# Patient Record
Sex: Female | Born: 1945
Health system: Southern US, Community
[De-identification: ages and names within clinical notes are randomized; demographics above are authoritative.]

## PROBLEM LIST (undated history)

## (undated) ENCOUNTER — Emergency Department (HOSPITAL_COMMUNITY): Payer: Medicare Other | Source: Home / Self Care

## (undated) DIAGNOSIS — E871 Hypo-osmolality and hyponatremia: Secondary | ICD-10-CM

## (undated) DIAGNOSIS — Z720 Tobacco use: Secondary | ICD-10-CM

## (undated) DIAGNOSIS — G500 Trigeminal neuralgia: Secondary | ICD-10-CM

## (undated) DIAGNOSIS — M810 Age-related osteoporosis without current pathological fracture: Secondary | ICD-10-CM

## (undated) DIAGNOSIS — E876 Hypokalemia: Secondary | ICD-10-CM

## (undated) DIAGNOSIS — E119 Type 2 diabetes mellitus without complications: Secondary | ICD-10-CM

## (undated) DIAGNOSIS — F329 Major depressive disorder, single episode, unspecified: Secondary | ICD-10-CM

## (undated) DIAGNOSIS — E7800 Pure hypercholesterolemia, unspecified: Secondary | ICD-10-CM

## (undated) DIAGNOSIS — T7840XA Allergy, unspecified, initial encounter: Secondary | ICD-10-CM

## (undated) DIAGNOSIS — L8990 Pressure ulcer of unspecified site, unspecified stage: Secondary | ICD-10-CM

## (undated) DIAGNOSIS — J439 Emphysema, unspecified: Secondary | ICD-10-CM

## (undated) DIAGNOSIS — I214 Non-ST elevation (NSTEMI) myocardial infarction: Secondary | ICD-10-CM

## (undated) DIAGNOSIS — H269 Unspecified cataract: Secondary | ICD-10-CM

## (undated) DIAGNOSIS — R9431 Abnormal electrocardiogram [ECG] [EKG]: Secondary | ICD-10-CM

## (undated) DIAGNOSIS — J45909 Unspecified asthma, uncomplicated: Secondary | ICD-10-CM

## (undated) DIAGNOSIS — N189 Chronic kidney disease, unspecified: Secondary | ICD-10-CM

## (undated) DIAGNOSIS — F419 Anxiety disorder, unspecified: Secondary | ICD-10-CM

## (undated) DIAGNOSIS — J449 Chronic obstructive pulmonary disease, unspecified: Secondary | ICD-10-CM

## (undated) DIAGNOSIS — J9600 Acute respiratory failure, unspecified whether with hypoxia or hypercapnia: Secondary | ICD-10-CM

## (undated) DIAGNOSIS — E1122 Type 2 diabetes mellitus with diabetic chronic kidney disease: Secondary | ICD-10-CM

## (undated) DIAGNOSIS — D649 Anemia, unspecified: Secondary | ICD-10-CM

## (undated) DIAGNOSIS — N183 Chronic kidney disease, stage 3 (moderate): Secondary | ICD-10-CM

## (undated) DIAGNOSIS — R739 Hyperglycemia, unspecified: Secondary | ICD-10-CM

## (undated) DIAGNOSIS — K219 Gastro-esophageal reflux disease without esophagitis: Secondary | ICD-10-CM

## (undated) DIAGNOSIS — F32A Depression, unspecified: Secondary | ICD-10-CM

## (undated) DIAGNOSIS — I5032 Chronic diastolic (congestive) heart failure: Secondary | ICD-10-CM

## (undated) DIAGNOSIS — I10 Essential (primary) hypertension: Secondary | ICD-10-CM

## (undated) DIAGNOSIS — R0902 Hypoxemia: Secondary | ICD-10-CM

## (undated) DIAGNOSIS — M1990 Unspecified osteoarthritis, unspecified site: Secondary | ICD-10-CM

## (undated) HISTORY — PX: PLANTAR'S WART EXCISION: SHX2240

## (undated) HISTORY — DX: Pure hypercholesterolemia, unspecified: E78.00

## (undated) HISTORY — DX: Emphysema, unspecified: J43.9

## (undated) HISTORY — DX: Type 2 diabetes mellitus with diabetic chronic kidney disease: E11.22

## (undated) HISTORY — DX: Major depressive disorder, single episode, unspecified: F32.9

## (undated) HISTORY — DX: Unspecified cataract: H26.9

## (undated) HISTORY — DX: Chronic kidney disease, stage 3 (moderate): N18.3

## (undated) HISTORY — DX: Age-related osteoporosis without current pathological fracture: M81.0

## (undated) HISTORY — DX: Hypokalemia: E87.6

## (undated) HISTORY — DX: Depression, unspecified: F32.A

## (undated) HISTORY — DX: Anemia, unspecified: D64.9

## (undated) HISTORY — DX: Acute respiratory failure, unspecified whether with hypoxia or hypercapnia: J96.00

## (undated) HISTORY — DX: Type 2 diabetes mellitus without complications: E11.9

## (undated) HISTORY — DX: Hypoxemia: R09.02

## (undated) HISTORY — DX: Pressure ulcer of unspecified site, unspecified stage: L89.90

## (undated) HISTORY — DX: Tobacco use: Z72.0

## (undated) HISTORY — PX: ABDOMINAL HYSTERECTOMY: SHX81

## (undated) HISTORY — DX: Chronic kidney disease, unspecified: N18.9

## (undated) HISTORY — DX: Allergy, unspecified, initial encounter: T78.40XA

## (undated) HISTORY — PX: CHOLECYSTECTOMY: SHX55

## (undated) HISTORY — DX: Trigeminal neuralgia: G50.0

---

## 1997-05-08 ENCOUNTER — Ambulatory Visit (HOSPITAL_COMMUNITY): Admission: RE | Admit: 1997-05-08 | Discharge: 1997-05-08 | Payer: Self-pay | Admitting: Psychiatry

## 1997-05-20 ENCOUNTER — Ambulatory Visit (HOSPITAL_COMMUNITY): Admission: RE | Admit: 1997-05-20 | Discharge: 1997-05-20 | Payer: Self-pay | Admitting: Otolaryngology

## 2007-09-04 ENCOUNTER — Ambulatory Visit (HOSPITAL_COMMUNITY): Admission: RE | Admit: 2007-09-04 | Discharge: 2007-09-04 | Payer: Self-pay | Admitting: Internal Medicine

## 2011-10-03 DIAGNOSIS — R0602 Shortness of breath: Secondary | ICD-10-CM

## 2011-10-04 ENCOUNTER — Encounter (HOSPITAL_COMMUNITY): Payer: Self-pay | Admitting: Cardiology

## 2011-10-04 ENCOUNTER — Inpatient Hospital Stay (HOSPITAL_COMMUNITY)
Admission: AD | Admit: 2011-10-04 | Discharge: 2011-10-06 | DRG: 280 | Disposition: A | Discharge status: Home / Self Care | Payer: Medicare Other | Source: Other Acute Inpatient Hospital | Attending: Cardiology | Admitting: Cardiology

## 2011-10-04 DIAGNOSIS — I10 Essential (primary) hypertension: Secondary | ICD-10-CM | POA: Diagnosis present

## 2011-10-04 DIAGNOSIS — I214 Non-ST elevation (NSTEMI) myocardial infarction: Principal | ICD-10-CM | POA: Diagnosis present

## 2011-10-04 DIAGNOSIS — J9620 Acute and chronic respiratory failure, unspecified whether with hypoxia or hypercapnia: Secondary | ICD-10-CM

## 2011-10-04 DIAGNOSIS — K219 Gastro-esophageal reflux disease without esophagitis: Secondary | ICD-10-CM | POA: Diagnosis present

## 2011-10-04 DIAGNOSIS — R748 Abnormal levels of other serum enzymes: Secondary | ICD-10-CM

## 2011-10-04 DIAGNOSIS — I2510 Atherosclerotic heart disease of native coronary artery without angina pectoris: Secondary | ICD-10-CM | POA: Diagnosis present

## 2011-10-04 DIAGNOSIS — Z7982 Long term (current) use of aspirin: Secondary | ICD-10-CM

## 2011-10-04 DIAGNOSIS — E876 Hypokalemia: Secondary | ICD-10-CM | POA: Diagnosis present

## 2011-10-04 DIAGNOSIS — J45901 Unspecified asthma with (acute) exacerbation: Secondary | ICD-10-CM | POA: Diagnosis present

## 2011-10-04 DIAGNOSIS — Z880 Allergy status to penicillin: Secondary | ICD-10-CM

## 2011-10-04 DIAGNOSIS — R7309 Other abnormal glucose: Secondary | ICD-10-CM | POA: Diagnosis present

## 2011-10-04 DIAGNOSIS — Z882 Allergy status to sulfonamides status: Secondary | ICD-10-CM

## 2011-10-04 DIAGNOSIS — F17200 Nicotine dependence, unspecified, uncomplicated: Secondary | ICD-10-CM | POA: Diagnosis present

## 2011-10-04 DIAGNOSIS — R0602 Shortness of breath: Secondary | ICD-10-CM

## 2011-10-04 DIAGNOSIS — Z9089 Acquired absence of other organs: Secondary | ICD-10-CM

## 2011-10-04 DIAGNOSIS — E871 Hypo-osmolality and hyponatremia: Secondary | ICD-10-CM | POA: Diagnosis present

## 2011-10-04 DIAGNOSIS — Z90710 Acquired absence of both cervix and uterus: Secondary | ICD-10-CM

## 2011-10-04 DIAGNOSIS — J441 Chronic obstructive pulmonary disease with (acute) exacerbation: Secondary | ICD-10-CM | POA: Diagnosis present

## 2011-10-04 HISTORY — DX: Gastro-esophageal reflux disease without esophagitis: K21.9

## 2011-10-04 HISTORY — DX: Unspecified asthma, uncomplicated: J45.909

## 2011-10-04 HISTORY — DX: Essential (primary) hypertension: I10

## 2011-10-04 HISTORY — DX: Anxiety disorder, unspecified: F41.9

## 2011-10-04 HISTORY — DX: Non-ST elevation (NSTEMI) myocardial infarction: I21.4

## 2011-10-04 HISTORY — DX: Abnormal electrocardiogram (ECG) (EKG): R94.31

## 2011-10-04 HISTORY — DX: Hyperglycemia, unspecified: R73.9

## 2011-10-04 HISTORY — DX: Hypo-osmolality and hyponatremia: E87.1

## 2011-10-04 HISTORY — DX: Chronic obstructive pulmonary disease, unspecified: J44.9

## 2011-10-04 HISTORY — DX: Unspecified osteoarthritis, unspecified site: M19.90

## 2011-10-04 LAB — BASIC METABOLIC PANEL
BUN: 10 mg/dL (ref 6–23)
GFR calc Af Amer: >90 mL/min (ref >90)
GFR calc non Af Amer: >90 mL/min (ref >90)
Potassium: 4.5 mEq/L (ref 3.5–5.1)
Sodium: 130 mEq/L — ABNORMAL LOW (ref 135–145)

## 2011-10-04 LAB — MRSA PCR SCREENING: MRSA by PCR: NEGATIVE

## 2011-10-04 MED ORDER — HEPARIN (PORCINE) IN NACL 100-0.45 UNIT/ML-% IJ SOLN
950.0000 [IU]/h | INTRAMUSCULAR | Status: DC
  Administered 2011-10-04: 650 [IU]/h via INTRAVENOUS
  Administered 2011-10-05: 950 [IU]/h via INTRAVENOUS
  Filled 2011-10-04 (×2): qty 250

## 2011-10-04 MED ORDER — ALBUTEROL SULFATE (5 MG/ML) 0.5% IN NEBU
2.5000 mg | INHALATION_SOLUTION | Freq: Four times a day (QID) | RESPIRATORY_TRACT | Status: DC
  Administered 2011-10-04: 2.5 mg via RESPIRATORY_TRACT
  Filled 2011-10-04: qty 0.5

## 2011-10-04 MED ORDER — SODIUM CHLORIDE 0.9 % IV SOLN: 250.0000 mL | INTRAVENOUS | Status: DC | PRN

## 2011-10-04 MED ORDER — PANTOPRAZOLE SODIUM 40 MG PO TBEC
40.0000 mg | DELAYED_RELEASE_TABLET | Freq: Two times a day (BID) | ORAL | Status: DC
  Administered 2011-10-04 – 2011-10-06 (×4): 40 mg via ORAL
  Filled 2011-10-04 (×4): qty 1

## 2011-10-04 MED ORDER — SODIUM CHLORIDE 0.9 % IJ SOLN: 3.0000 mL | Freq: Two times a day (BID) | INTRAMUSCULAR | Status: DC

## 2011-10-04 MED ORDER — CEFTRIAXONE SODIUM 1 G IJ SOLR
1.0000 g | INTRAMUSCULAR | Status: DC
  Administered 2011-10-05 – 2011-10-06 (×2): 1 g via INTRAVENOUS
  Filled 2011-10-04 (×3): qty 10

## 2011-10-04 MED ORDER — SODIUM CHLORIDE 0.9 % IJ SOLN: 3.0000 mL | INTRAMUSCULAR | Status: DC | PRN

## 2011-10-04 MED ORDER — DEXTROSE 5 % IV SOLN: 1.0000 g | INTRAVENOUS | Status: DC

## 2011-10-04 MED ORDER — METHYLPREDNISOLONE SODIUM SUCC 125 MG IJ SOLR
80.0000 mg | Freq: Three times a day (TID) | INTRAMUSCULAR | Status: DC
  Administered 2011-10-04: 19:00:00 via INTRAVENOUS
  Administered 2011-10-05 – 2011-10-06 (×5): 80 mg via INTRAVENOUS
  Filled 2011-10-04 (×4): qty 1.28
  Filled 2011-10-04: qty 2
  Filled 2011-10-04: qty 1.28
  Filled 2011-10-04: qty 2
  Filled 2011-10-04: qty 1.28

## 2011-10-04 MED ORDER — NITROGLYCERIN 0.4 MG SL SUBL: 0.4000 mg | SUBLINGUAL_TABLET | SUBLINGUAL | Status: DC | PRN

## 2011-10-04 MED ORDER — IPRATROPIUM BROMIDE 0.02 % IN SOLN
0.5000 mg | Freq: Four times a day (QID) | RESPIRATORY_TRACT | Status: DC
  Administered 2011-10-04: 0.5 mg via RESPIRATORY_TRACT
  Filled 2011-10-04: qty 2.5

## 2011-10-04 MED ORDER — ACETAMINOPHEN 325 MG PO TABS
650.0000 mg | ORAL_TABLET | ORAL | Status: DC | PRN
  Administered 2011-10-05: 650 mg via ORAL
  Filled 2011-10-04: qty 2

## 2011-10-04 MED ORDER — ASPIRIN EC 81 MG PO TBEC
81.0000 mg | DELAYED_RELEASE_TABLET | Freq: Every day | ORAL | Status: DC
  Administered 2011-10-06: 81 mg via ORAL
  Filled 2011-10-04 (×2): qty 1

## 2011-10-04 MED ORDER — ONDANSETRON HCL 4 MG/2ML IJ SOLN: 4.0000 mg | Freq: Four times a day (QID) | INTRAMUSCULAR | Status: DC | PRN

## 2011-10-04 NOTE — H&P (Signed)
NAME:  Sheila Pierce, Sheila Pierce ROOM: 250  UNIT NUMBER:  [id number] LOCATION: [facility] 07 ADM/VISIT DATE:  10/04/2011   ADM Sheila Pierce:  [id number] DOB: [birth year]   PRIMARY CARDIOLOGIST:  None.  PRIMARY MEDICAL DOCTOR:  Kirstie Peri, M.D.  REASON FOR CONSULTATION:  Elevated troponin of 1.32.  CHIEF COMPLAINT:  Shortness of breath.  HISTORY OF PRESENT ILLNESS:  Sheila Pierce is a 66 year old female with a history of COPD, hypertension but no formerly diagnosed heart disease with a negative stress echo 02/2011.  She initially presented overnight Saturday/early Sunday morning with complaints of shortness of breath and wheezing.  Her white count was normal at that time.  Chest x-ray showed severe COPD but was not acute otherwise.  She was treated with Solu-Medrol and albuterol and was discharged to home in improved condition.  However, she returned later that evening, which was last night, with similar complaints.  She became even more short of breath to the point where she was gasping for air.  Her son attempted to turn her home oxygen up to 5 liters but she was still in distress, thus EMS was called.  She was placed on BiPAP.  Chest x-ray revealed severe COPD without active cardiopulmonary disease.  Sodium was low at 129 with an elevation in white count to 17,200 keeping in mind that she had recommended Solu-Medrol within the last 24 hours.  Cardiac enzymes were obtained demonstrating an elevated troponin of up to 1.32, trending down to 1.11.  Potassium was 2.7.   She was given Lovenox full dose, nitroglycerin paste, 40 mEq of potassium and started on ceftriaxone and Solu-Medrol as well as albuterol nebulizers.  She endorses chest discomfort like a soreness sensation when she was acutely gasping for air but otherwise denies this.  She is not having chest pain now.  The shortness of breath has improved this morning and she is more comfortable off of BiPAP.  She has chronic dyspnea on exertion and cough but  worse lately, preceded by what she describes as a head cold given that ragweed typically exacerbates her allergy symptoms.  She is currently comfortable.  EKG demonstrates normal sinus rhythm with wide T wave inversions in 2, 3 and aVF as well as V4 though V6 with QTC prolongation of 590 milliseconds, probable anterior infarct, age undetermined.  QTC on 10/03/2011 was 462.  PAST MEDICAL HISTORY: 1. COPD on home oxygen. 2. Hypertension. 3. Chest pain, 02/2011 but with a negative dobutamine stress echo at that time with normal ejection fraction.  I note that she had atherosclerotic calcifications of the abdomen aorta and branch vessels on a CT abdomen that admission. 4. Asthma. 5. Seasonal allergies.  PAST SURGICAL HISTORY: 1. Cholecystectomy. 2. Hysterectomy.  MEDICATIONS: 1. Albuterol/Atrovent nebulized q. 6 hours. 2. Solu-Medrol 80 mg q. 8 hours. 3. Ceftriaxone 1 gram q. 24 hours. 4. Lovenox per pharmacy. 5. Aspirin 162 mg 10/03/2011, although the patient does not recall taking this.  HOME MEDICATIONS: 1. Quinapril 40 mg daily. 2. Advair 250 mcg b.i.d. 3. Albuterol inhaled q. 4 hours. 4. Omeprazole 20 mg b.i.d. 5. Zithromax and prednisone which were prescribed from her ER discharge early Sunday morning.  ALLERGIES:  CODEINE, HYDROCODONE, PENICILLIN, SULFA.  SOCIAL HISTORY:   The patient has been smoking for 40 years and states that nobody can make up her mind but herself but currently is not trying to quit.  She denies any alcohol or drug use.  FAMILY HISTORY:  Positive for cancer with liver  cancer in her mother and lung cancer in her father.  Negative for coronary artery disease.  REVIEW OF SYSTEMS:  No fever or chills, nausea or vomiting.  Please above.  No orthopnea, PND.  No recent travel or bedrest.  All other systems were reviewed and are otherwise negative.  LABORATORY AND RADIOLOGICAL DATA:  WBC 13,600, hemoglobin 12.5, hematocrit 36.9, platelet count 232, 000.  INR 0.9.    Cardiac enzymes on the first set showed CK of 130, MB 15.4 and troponin 1.32, relative index 11.8.   The next set showed CK 98, MB 15.1, troponin 1.11 with a relative index of 15.4.  BNP 538.  Sodium 130, potassium 3, chloride 91, CO2 is 30 and glucose 148, BUN 10, creatinine 0.65.  Chest x-ray showed stable severe COPD with bibasilar scarring and no active disease.  EKG 10/04/2011 showed normal sinus rhythm at 83 beats per minute with wide T wave inversions in 2, 3, aVF and V4 through V6 with probable anterior infarct age undetermined, QTC of 32.  On 10/03/2011, similar T wave inversion was present but with a QTC of 462.  These changes are new since 12/15/2010.  PHYSICAL EXAMINATION:  Vital signs:  Pulse 103, respirations 18, blood pressure 112/78, pulse oximetry 98% on oxygen.  General:  This is a chronically ill-appearing white female, thin in no acute distress.  HEENT:  Normocephalic and atraumatic.  Extraocular movements intact.   Clear sclerae.  Nares are without discharge.  Neck:  Supple without carotid bruits.  Heart:  On auscultation, reveals slightly tachycardiac rate, regular rhythm with S1 and S2 without murmurs, rubs or gallops.  Lungs:  Sounds have decreased breath sounds throughout, faint wheezes but otherwise clear bilaterally.  Abdomen:  Soft and nontender, nondistended with positive bowel sounds.  Extremities:  Warm and dry without edema.  She has 2+ pedal pulses bilaterally.  Neurological:  She is alert and oriented x3 and responds to questions appropriately with a normal affect.  ASSESSMENT AND PLAN: 1. Acute on chronic respiratory failure. 2. Acute on chronic obstructive pulmonary disease exacerbation. 3. Non-STEMI with no prior history of known coronary artery disease. 4. Abnormal electrocardiogram/QTC prolongation in the setting of hypokalemia. 5. History of hypertension, stable.  The patient was seen and examined by Dr. Antoine Poche and myself.  Sheila Pierce is a 66 year old lady  with a history of COPD and hypertension, longstanding tobacco abuse who presents with acute and chronic respiratory failure in the setting of acute COPD exacerbation but cannot rule out underlying coronary artery disease, given increased troponin up to 1.32.  Given her cardiac risk factors, we would recommend cardiac catheterization but may need to hold off until improved from a respiratory standpoint.  We will give 324 mg of aspirin x1 now given uncertainty of the time frame of which she received her initial aspirin and then start 81 mg daily tomorrow.  We would recommend transfer to [facility] for cardiac monitoring with plans for eventual catheterization.  We would continue Lovenox for now, but may transition to heparin per pharmacy given need for procedure.  No beta blockers secondary to COPD.  Check fasting lipids in the morning and add statin if needed.  For EKG changes/QTC prolongation, we need to address her electrolytes and for now we will give 3 runs of 10 mEq of potassium chloride over 1 hour each followed by 40 mEq dose of p.o. potassium and then reassess status later this afternoon.  Magnesium level is 1.8 so we will give  1 gram of magnesium sulfate as well.  Risks, benefits and alternatives to catheterization were discussed with the patient and she is agreeable to transfer and proceeding with heart catheterization.  We hope to arrange this transfer later on today.  She will need to be reassessed in the morning before formerly scheduling catheterization.   __________________________    Shirlean Mylar M.D.  Alinda Money D: 10/04/2011 1253 T: 10/04/2011 1327 P: [id number]  cc:  Kirstie Peri, M.D.

## 2011-10-04 NOTE — Progress Notes (Addendum)
Met pt here in [place] upon transfer. VSS. Breathing is stable on [place]. Will check BMET to reassess K. QTc appears much narrower on telemetry now (received 3 runs of and PO KCl prior to arrival due to K of 3.0 as well as 1g Mg Sulfate). Hold off on restarting home ACEI due to soft BP's. Continue nebs, Ceftriaxone (addendum: pt has hx of PCN allergy but has tolerated Ceftriaxone thus far as she began receiving it 10/03/11- will continue to monitor closely). Pt should be reassessed in AM to determine if she is ready for cath. Will ask for heparin per pharmacy in meantime starting tonight (was on Lovenox at [facility] but due to impending cath would prefer shorter acting agent). She has no complaints. All questions answered. Dayna Dunn PA-C

## 2011-10-04 NOTE — Progress Notes (Signed)
ANTICOAGULATION CONSULT NOTE - Initial Consult  Pharmacy Consult for heparin Indication: chest pain/ACS  Allergies  Allergen Reactions  . Codeine   . Hydrocodone   . Penicillins   . Sulfa Antibiotics     Patient Measurements: Height: 5' (152.4 cm) Weight: 103 lb 9.9 oz (47 kg) IBW/kg (Calculated) : 50  Heparin Dosing Weight: 47 kg  Vital Signs: Temp: 98.3 F (36.8 C) (09/09 1703) Temp src: Oral (09/09 1703) BP: 127/69 mmHg (09/09 1703) Pulse Rate: 93  (09/09 1703)  Labs: No results found for this basename: HGB:2,HCT:3,PLT:3,APTT:3,LABPROT:3,INR:3,HEPARINUNFRC:3,CREATININE:3,CKTOTAL:3,CKMB:3,TROPONINI:3 in the last 72 hours  CrCl is unknown because no creatinine reading has been taken.   Medical History: No past medical history on file.  Medications:  Prescriptions prior to admission  Medication Sig Dispense Refill  . quinapril (ACCUPRIL) 40 MG tablet Take 40 mg by mouth daily.        Assessment: 66 yo lady transferred from [facility] for further evaluation of chest pain.  Per report, she received lovenox this am but I do not see where this was charted.  She is to start heparin here as cath planned for am. Goal of Therapy:  Heparin level 0.3-0.7 units/ml Monitor platelets by anticoagulation protocol: Yes   Plan:  Start heparin drip with no bolus at 650 units/hr. Check heparin level 8 hours after start. Daily HL and CBC while on heparin.   Monitor for s&s bleeding.  Thanks for allowing pharmacy to be a part of this patient's care.  Talbert Cage, PharmD Clinical Pharmacist, [phone]

## 2011-10-05 ENCOUNTER — Encounter (HOSPITAL_COMMUNITY)
Admission: AD | Disposition: A | Discharge status: Home / Self Care | Payer: Self-pay | Source: Other Acute Inpatient Hospital | Attending: Cardiology

## 2011-10-05 DIAGNOSIS — I2510 Atherosclerotic heart disease of native coronary artery without angina pectoris: Secondary | ICD-10-CM

## 2011-10-05 DIAGNOSIS — I214 Non-ST elevation (NSTEMI) myocardial infarction: Secondary | ICD-10-CM | POA: Diagnosis present

## 2011-10-05 HISTORY — PX: LEFT HEART CATHETERIZATION WITH CORONARY ANGIOGRAM: SHX5451

## 2011-10-05 LAB — BASIC METABOLIC PANEL
CO2: 27 mEq/L (ref 19–32)
Chloride: 97 mEq/L (ref 96–112)
Glucose, Bld: 122 mg/dL — ABNORMAL HIGH (ref 70–99)
Potassium: 4.6 mEq/L (ref 3.5–5.1)
Sodium: 131 mEq/L — ABNORMAL LOW (ref 135–145)

## 2011-10-05 LAB — EXPECTORATED SPUTUM ASSESSMENT W GRAM STAIN, RFLX TO RESP C

## 2011-10-05 LAB — POCT ACTIVATED CLOTTING TIME: Activated Clotting Time: 104 seconds

## 2011-10-05 LAB — HEMOGLOBIN A1C: Hgb A1c MFr Bld: 5.6 % (ref <5.7)

## 2011-10-05 LAB — CBC
HCT: 36.8 % (ref 36.0–46.0)
MCH: 31.1 pg (ref 26.0–34.0)
MCHC: 34.5 g/dL (ref 30.0–36.0)
MCV: 90.2 fL (ref 78.0–100.0)
Platelets: 282 10*3/uL (ref 150–400)
RDW: 14 % (ref 11.5–15.5)
WBC: 15.6 10*3/uL — ABNORMAL HIGH (ref 4.0–10.5)

## 2011-10-05 LAB — COMPREHENSIVE METABOLIC PANEL WITH GFR
ALT: 17 U/L (ref 0–35)
AST: 20 U/L (ref 0–37)
Albumin: 2.9 g/dL — ABNORMAL LOW (ref 3.5–5.2)
Alkaline Phosphatase: 51 U/L (ref 39–117)
BUN: 12 mg/dL (ref 6–23)
CO2: 29 meq/L (ref 19–32)
Calcium: 9.3 mg/dL (ref 8.4–10.5)
Chloride: 96 meq/L (ref 96–112)
Creatinine, Ser: 0.53 mg/dL (ref 0.50–1.10)
GFR calc Af Amer: >90 mL/min
GFR calc non Af Amer: >90 mL/min
Glucose, Bld: 123 mg/dL — ABNORMAL HIGH (ref 70–99)
Potassium: 5.1 meq/L (ref 3.5–5.1)
Sodium: 131 meq/L — ABNORMAL LOW (ref 135–145)
Total Bilirubin: 0.2 mg/dL — ABNORMAL LOW (ref 0.3–1.2)
Total Protein: 5.6 g/dL — ABNORMAL LOW (ref 6.0–8.3)

## 2011-10-05 LAB — LIPID PANEL: LDL Cholesterol: 89 mg/dL (ref 0–99)

## 2011-10-05 LAB — MAGNESIUM: Magnesium: 1.9 mg/dL (ref 1.5–2.5)

## 2011-10-05 SURGERY — LEFT HEART CATHETERIZATION WITH CORONARY ANGIOGRAM
Anesthesia: LOCAL

## 2011-10-05 MED ORDER — SODIUM CHLORIDE 0.9 % IV SOLN: 250.0000 mL | INTRAVENOUS | Status: DC | PRN

## 2011-10-05 MED ORDER — IPRATROPIUM BROMIDE 0.02 % IN SOLN
0.5000 mg | Freq: Two times a day (BID) | RESPIRATORY_TRACT | Status: DC
  Administered 2011-10-05 – 2011-10-06 (×3): 0.5 mg via RESPIRATORY_TRACT
  Filled 2011-10-05 (×3): qty 2.5

## 2011-10-05 MED ORDER — SODIUM CHLORIDE 0.9 % IJ SOLN: 3.0000 mL | INTRAMUSCULAR | Status: DC | PRN

## 2011-10-05 MED ORDER — ONDANSETRON HCL 4 MG/2ML IJ SOLN: 4.0000 mg | Freq: Four times a day (QID) | INTRAMUSCULAR | Status: DC | PRN

## 2011-10-05 MED ORDER — ALBUTEROL SULFATE (5 MG/ML) 0.5% IN NEBU: 2.5000 mg | INHALATION_SOLUTION | Freq: Four times a day (QID) | RESPIRATORY_TRACT | Status: DC | PRN

## 2011-10-05 MED ORDER — SODIUM CHLORIDE 0.9 % IV SOLN: 250.0000 mL | INTRAVENOUS | Status: DC

## 2011-10-05 MED ORDER — ALBUTEROL SULFATE (5 MG/ML) 0.5% IN NEBU
2.5000 mg | INHALATION_SOLUTION | Freq: Two times a day (BID) | RESPIRATORY_TRACT | Status: DC
  Administered 2011-10-05 – 2011-10-06 (×3): 2.5 mg via RESPIRATORY_TRACT
  Filled 2011-10-05 (×3): qty 0.5

## 2011-10-05 MED ORDER — IPRATROPIUM BROMIDE 0.02 % IN SOLN: 0.5000 mg | Freq: Two times a day (BID) | RESPIRATORY_TRACT | Status: DC

## 2011-10-05 MED ORDER — NITROGLYCERIN 0.2 MG/ML ON CALL CATH LAB
INTRAVENOUS | Status: AC
  Filled 2011-10-05: qty 1

## 2011-10-05 MED ORDER — SODIUM CHLORIDE 0.9 % IV SOLN: 1.0000 mL/kg/h | INTRAVENOUS | Status: AC

## 2011-10-05 MED ORDER — MIDAZOLAM HCL 2 MG/2ML IJ SOLN
INTRAMUSCULAR | Status: AC
  Filled 2011-10-05: qty 2

## 2011-10-05 MED ORDER — LIDOCAINE HCL (PF) 1 % IJ SOLN
INTRAMUSCULAR | Status: AC
  Filled 2011-10-05: qty 30

## 2011-10-05 MED ORDER — HEPARIN (PORCINE) IN NACL 2-0.9 UNIT/ML-% IJ SOLN
INTRAMUSCULAR | Status: AC
  Filled 2011-10-05: qty 2000

## 2011-10-05 MED ORDER — SODIUM CHLORIDE 0.9 % IJ SOLN
3.0000 mL | Freq: Two times a day (BID) | INTRAMUSCULAR | Status: DC
  Administered 2011-10-06: 3 mL via INTRAVENOUS

## 2011-10-05 MED ORDER — SODIUM CHLORIDE 0.9 % IJ SOLN: 3.0000 mL | Freq: Two times a day (BID) | INTRAMUSCULAR | Status: DC

## 2011-10-05 MED ORDER — SODIUM CHLORIDE 0.9 % IV SOLN
INTRAVENOUS | Status: DC
  Administered 2011-10-05: 50 mL/h via INTRAVENOUS

## 2011-10-05 MED ORDER — ACETAMINOPHEN 325 MG PO TABS: 650.0000 mg | ORAL_TABLET | ORAL | Status: DC | PRN

## 2011-10-05 MED ORDER — ASPIRIN 81 MG PO CHEW
324.0000 mg | CHEWABLE_TABLET | ORAL | Status: DC
  Filled 2011-10-05: qty 4

## 2011-10-05 MED ORDER — FENTANYL CITRATE 0.05 MG/ML IJ SOLN
INTRAMUSCULAR | Status: AC
  Filled 2011-10-05: qty 2

## 2011-10-05 MED ORDER — HEPARIN BOLUS VIA INFUSION
1500.0000 [IU] | Freq: Once | INTRAVENOUS | Status: AC
  Administered 2011-10-05: 1500 [IU] via INTRAVENOUS
  Filled 2011-10-05: qty 1500

## 2011-10-05 MED ORDER — ASPIRIN 81 MG PO CHEW
324.0000 mg | CHEWABLE_TABLET | ORAL | Status: AC
  Administered 2011-10-05: 324 mg via ORAL

## 2011-10-05 NOTE — CV Procedure (Signed)
   Cardiac Catheterization Procedure Note  Name: Sheila Pierce MRN: [id number] DOB: [birth year]  Procedure: Left Heart Cath, Selective Coronary Angiography, LV angiography  Indication: Elevated troponin in the setting of COPD exacerbation  Procedural details: The right groin was prepped, draped, and anesthetized with 1% lidocaine. Using modified Seldinger technique, a 5 French sheath was introduced into the right femoral artery. Standard Judkins catheters were used for coronary angiography and left ventriculography. Catheter exchanges were performed over a guidewire. There were no immediate procedural complications. The patient was transferred to the post catheterization recovery area for further monitoring.  Procedural Findings: Hemodynamics:  AO 116/60 LV 115/8   Coronary angiography: Coronary dominance: right  Left mainstem: Widely patent without significant stenosis.  Left anterior descending (LAD): The LAD is patent throughout. She has minimal luminal irregularity. There are 2 diagonals, both are small. The first diagonal has 40% ostial stenosis.  Left circumflex (LCx): The left circumflex is widely patent. There were 2 obtuse marginal branches without significant stenosis. The proximal left circumflex has mild irregularity noted.  Right coronary artery (RCA): This is a dominant vessel. The RCA is of moderate caliber. The PDA and PLA branches are patent. There is no significant stenosis present.  Left ventriculography: There is mild hypokinesis of the antero-apex. The left ventricular ejection fraction is estimated at 50%.  Final Conclusions:   1. Nonobstructive coronary artery disease 2. Mild segmental left ventricular systolic dysfunction.  Tonny Bollman 10/05/2011, 5:45 PM

## 2011-10-05 NOTE — Interval H&P Note (Signed)
History and Physical Interval Note:  10/05/2011 5:13 PM  Sheila Pierce  has presented today for surgery, with the diagnosis of cp  The various methods of treatment have been discussed with the patient and family. After consideration of risks, benefits and other options for treatment, the patient has consented to  Procedure(s) (LRB) with comments: LEFT HEART CATHETERIZATION WITH CORONARY ANGIOGRAM (N/A) as a surgical intervention .  The patient's history has been reviewed, patient examined, no change in status, stable for surgery.  I have reviewed the patient's chart and labs.  Questions were answered to the patient's satisfaction.     Tonny Bollman

## 2011-10-05 NOTE — Care Management Note (Signed)
    Page 1 of 1   10/05/2011     8:43:49 AM   CARE MANAGEMENT NOTE 10/05/2011  Patient:  Sheila Pierce, Sheila Pierce   Account Number:  [id number]  Date Initiated:  10/05/2011  Documentation initiated by:  Junius Creamer  Subjective/Objective Assessment:   adm  w mi, copd     Action/Plan:   lives w fam   Anticipated DC Date:     Anticipated DC Plan:        DC Planning Services  CM consult      Choice offered to / List presented to:             Status of service:   Medicare Important Message given?   (If response is "NO", the following Medicare IM given date fields will be blank) Date Medicare IM given:   Date Additional Medicare IM given:    Discharge Disposition:    Per UR Regulation:  Reviewed for med. necessity/level of care/duration of stay  If discussed at Long Length of Stay Meetings, dates discussed:    Comments:  9/10 8:42a debbie Kynleigh Artz rn,bsn [phone]

## 2011-10-05 NOTE — H&P (View-Only) (Signed)
SUBJECTIVE:  Breathing is close to baseline   PHYSICAL EXAM Filed Vitals:   10/04/11 2200 10/04/11 2300 10/05/11 0000 10/05/11 0400  BP: 112/57 108/51 119/68 127/68  Pulse: 94 96 92   Temp:   97.8 F (36.6 C) 98.4 F (36.9 C)  TempSrc:   Oral Oral  Resp:   16 20  Height:      Weight:      SpO2: 99% 97% 97%    General:  No acute distress, frail Lungs:  Decreased breath sounds with wheezing Heart:  RRR Abdomen:  Positive bowel sounds, no rebound no guarding Extremities:  No edema  LABS: No results found for this basename: CKTOTAL, CKMB, CKMBINDEX, TROPONINI   Results for orders placed during the hospital encounter of 10/04/11 (from the past 24 hour(s))  MRSA PCR SCREENING     Status: Normal   Collection Time   10/04/11  5:01 PM      Component Value Range   MRSA by PCR NEGATIVE  NEGATIVE  BASIC METABOLIC PANEL     Status: Abnormal   Collection Time   10/04/11  5:35 PM      Component Value Range   Sodium 130 (*) 135 - 145 mEq/L   Potassium 4.5  3.5 - 5.1 mEq/L   Chloride 94 (*) 96 - 112 mEq/L   CO2 29  19 - 32 mEq/L   Glucose, Bld 107 (*) 70 - 99 mg/dL   BUN 10  6 - 23 mg/dL   Creatinine, Ser 0.86  0.50 - 1.10 mg/dL   Calcium 9.4  8.4 - 57.8 mg/dL   GFR calc non Af Amer >90  >90 mL/min   GFR calc Af Amer >90  >90 mL/min  CULTURE, EXPECTORATED SPUTUM-ASSESSMENT     Status: Normal   Collection Time   10/04/11  7:23 PM      Component Value Range   Specimen Description SPUTUM     Special Requests NONE     Sputum evaluation       Value: MICROSCOPIC FINDINGS SUGGEST THAT THIS SPECIMEN IS NOT REPRESENTATIVE OF LOWER RESPIRATORY SECRETIONS. PLEASE RECOLLECT.     CALLED TO RN H.HAYES BY L.PITT AT 0039 10/05/11   Report Status 10/05/2011 FINAL    COMPREHENSIVE METABOLIC PANEL     Status: Abnormal   Collection Time   10/05/11  1:43 AM      Component Value Range   Sodium 131 (*) 135 - 145 mEq/L   Potassium 5.1  3.5 - 5.1 mEq/L   Chloride 96  96 - 112 mEq/L   CO2 29  19 -  32 mEq/L   Glucose, Bld 123 (*) 70 - 99 mg/dL   BUN 12  6 - 23 mg/dL   Creatinine, Ser 4.69  0.50 - 1.10 mg/dL   Calcium 9.3  8.4 - 62.9 mg/dL   Total Protein 5.6 (*) 6.0 - 8.3 g/dL   Albumin 2.9 (*) 3.5 - 5.2 g/dL   AST 20  0 - 37 U/L   ALT 17  0 - 35 U/L   Alkaline Phosphatase 51  39 - 117 U/L   Total Bilirubin 0.2 (*) 0.3 - 1.2 mg/dL   GFR calc non Af Amer >90  >90 mL/min   GFR calc Af Amer >90  >90 mL/min  MAGNESIUM     Status: Normal   Collection Time   10/05/11  1:43 AM      Component Value Range   Magnesium 1.9  1.5 -  2.5 mg/dL  HEPARIN LEVEL (UNFRACTIONATED)     Status: Abnormal   Collection Time   10/05/11  1:43 AM      Component Value Range   Heparin Unfractionated <0.10 (*) 0.30 - 0.70 IU/mL  CBC     Status: Abnormal   Collection Time   10/05/11  1:43 AM      Component Value Range   WBC 15.6 (*) 4.0 - 10.5 K/uL   RBC 4.08  3.87 - 5.11 MIL/uL   Hemoglobin 12.7  12.0 - 15.0 g/dL   HCT 16.1  09.6 - 04.5 %   MCV 90.2  78.0 - 100.0 fL   MCH 31.1  26.0 - 34.0 pg   MCHC 34.5  30.0 - 36.0 g/dL   RDW 40.9  81.1 - 91.4 %   Platelets 282  150 - 400 K/uL  LIPID PANEL     Status: Normal   Collection Time   10/05/11  1:43 AM      Component Value Range   Cholesterol 158  0 - 200 mg/dL   Triglycerides 75  <782 mg/dL   HDL 54  >95 mg/dL   Total CHOL/HDL Ratio 2.9     VLDL 15  0 - 40 mg/dL   LDL Cholesterol 89  0 - 99 mg/dL    Intake/Output Summary (Last 24 hours) at 10/05/11 6213 Last data filed at 10/05/11 0600  Gross per 24 hour  Intake  432.7 ml  Output    600 ml  Net -167.3 ml    ASSESSMENT AND PLAN:  NQWMI - Cath today.  Hypokalemia - Supplemented.  Repeat 5.1.  EKG pending.  COPD -  Continue current therapy.  Tobacco abuse - She has been educated and is not ready to quit   Rollene Rotunda 10/05/2011 6:26 AM

## 2011-10-05 NOTE — Progress Notes (Signed)
ANTICOAGULATION CONSULT NOTE   Pharmacy Consult for heparin Indication: chest pain/ACS  Allergies  Allergen Reactions  . Penicillins Swelling  . Codeine   . Hydrocodone   . Sulfa Antibiotics     Patient Measurements: Height: 5' (152.4 cm) Weight: 103 lb 9.9 oz (47 kg) IBW/kg (Calculated) : 50  Heparin Dosing Weight: 47 kg  Vital Signs: Temp: 97.8 F (36.6 C) (09/10 0000) Temp src: Oral (09/10 0000) BP: 119/68 mmHg (09/10 0000) Pulse Rate: 92  (09/10 0000)  Labs:  Basename 10/05/11 0143 10/04/11 1735  HGB 12.7 --  HCT 36.8 --  PLT 282 --  APTT -- --  LABPROT -- --  INR -- --  HEPARINUNFRC <0.10* --  CREATININE -- 0.59  CKTOTAL -- --  CKMB -- --  TROPONINI -- --    Estimated Creatinine Clearance: 49.7 ml/min (by C-G formula based on Cr of 0.59).  Assessment: 66 yo female with chest pain for Heparin  Goal of Therapy:  Heparin level 0.3-0.7 units/ml Monitor platelets by anticoagulation protocol: Yes   Plan:  Heparin 1500 units IV bolus, then increase heparin 950 units/hr Follow-up after cath.  Geannie Risen, PharmD, BCPS

## 2011-10-05 NOTE — Progress Notes (Signed)
 SUBJECTIVE:  Breathing is close to baseline   PHYSICAL EXAM Filed Vitals:   10/04/11 2200 10/04/11 2300 10/05/11 0000 10/05/11 0400  BP: 112/57 108/51 119/68 127/68  Pulse: 94 96 92   Temp:   97.8 F (36.6 C) 98.4 F (36.9 C)  TempSrc:   Oral Oral  Resp:   16 20  Height:      Weight:      SpO2: 99% 97% 97%    General:  No acute distress, frail Lungs:  Decreased breath sounds with wheezing Heart:  RRR Abdomen:  Positive bowel sounds, no rebound no guarding Extremities:  No edema  LABS: No results found for this basename: CKTOTAL, CKMB, CKMBINDEX, TROPONINI   Results for orders placed during the hospital encounter of 10/04/11 (from the past 24 hour(s))  MRSA PCR SCREENING     Status: Normal   Collection Time   10/04/11  5:01 PM      Component Value Range   MRSA by PCR NEGATIVE  NEGATIVE  BASIC METABOLIC PANEL     Status: Abnormal   Collection Time   10/04/11  5:35 PM      Component Value Range   Sodium 130 (*) 135 - 145 mEq/L   Potassium 4.5  3.5 - 5.1 mEq/L   Chloride 94 (*) 96 - 112 mEq/L   CO2 29  19 - 32 mEq/L   Glucose, Bld 107 (*) 70 - 99 mg/dL   BUN 10  6 - 23 mg/dL   Creatinine, Ser 0.59  0.50 - 1.10 mg/dL   Calcium 9.4  8.4 - 10.5 mg/dL   GFR calc non Af Amer >90  >90 mL/min   GFR calc Af Amer >90  >90 mL/min  CULTURE, EXPECTORATED SPUTUM-ASSESSMENT     Status: Normal   Collection Time   10/04/11  7:23 PM      Component Value Range   Specimen Description SPUTUM     Special Requests NONE     Sputum evaluation       Value: MICROSCOPIC FINDINGS SUGGEST THAT THIS SPECIMEN IS NOT REPRESENTATIVE OF LOWER RESPIRATORY SECRETIONS. PLEASE RECOLLECT.     CALLED TO RN H.HAYES BY L.PITT AT 0039 10/05/11   Report Status 10/05/2011 FINAL    COMPREHENSIVE METABOLIC PANEL     Status: Abnormal   Collection Time   10/05/11  1:43 AM      Component Value Range   Sodium 131 (*) 135 - 145 mEq/L   Potassium 5.1  3.5 - 5.1 mEq/L   Chloride 96  96 - 112 mEq/L   CO2 29  19 -  32 mEq/L   Glucose, Bld 123 (*) 70 - 99 mg/dL   BUN 12  6 - 23 mg/dL   Creatinine, Ser 0.53  0.50 - 1.10 mg/dL   Calcium 9.3  8.4 - 10.5 mg/dL   Total Protein 5.6 (*) 6.0 - 8.3 g/dL   Albumin 2.9 (*) 3.5 - 5.2 g/dL   AST 20  0 - 37 U/L   ALT 17  0 - 35 U/L   Alkaline Phosphatase 51  39 - 117 U/L   Total Bilirubin 0.2 (*) 0.3 - 1.2 mg/dL   GFR calc non Af Amer >90  >90 mL/min   GFR calc Af Amer >90  >90 mL/min  MAGNESIUM     Status: Normal   Collection Time   10/05/11  1:43 AM      Component Value Range   Magnesium 1.9  1.5 -   2.5 mg/dL  HEPARIN LEVEL (UNFRACTIONATED)     Status: Abnormal   Collection Time   10/05/11  1:43 AM      Component Value Range   Heparin Unfractionated <0.10 (*) 0.30 - 0.70 IU/mL  CBC     Status: Abnormal   Collection Time   10/05/11  1:43 AM      Component Value Range   WBC 15.6 (*) 4.0 - 10.5 K/uL   RBC 4.08  3.87 - 5.11 MIL/uL   Hemoglobin 12.7  12.0 - 15.0 g/dL   HCT 36.8  36.0 - 46.0 %   MCV 90.2  78.0 - 100.0 fL   MCH 31.1  26.0 - 34.0 pg   MCHC 34.5  30.0 - 36.0 g/dL   RDW 14.0  11.5 - 15.5 %   Platelets 282  150 - 400 K/uL  LIPID PANEL     Status: Normal   Collection Time   10/05/11  1:43 AM      Component Value Range   Cholesterol 158  0 - 200 mg/dL   Triglycerides 75  <150 mg/dL   HDL 54  >39 mg/dL   Total CHOL/HDL Ratio 2.9     VLDL 15  0 - 40 mg/dL   LDL Cholesterol 89  0 - 99 mg/dL    Intake/Output Summary (Last 24 hours) at 10/05/11 0626 Last data filed at 10/05/11 0600  Gross per 24 hour  Intake  432.7 ml  Output    600 ml  Net -167.3 ml    ASSESSMENT AND PLAN:  NQWMI - Cath today.  Hypokalemia - Supplemented.  Repeat 5.1.  EKG pending.  COPD -  Continue current therapy.  Tobacco abuse - She has been educated and is not ready to quit   Rane Blitch 10/05/2011 6:26 AM   

## 2011-10-06 ENCOUNTER — Encounter (HOSPITAL_COMMUNITY): Payer: Self-pay | Admitting: Physician Assistant

## 2011-10-06 DIAGNOSIS — I219 Acute myocardial infarction, unspecified: Secondary | ICD-10-CM

## 2011-10-06 LAB — BASIC METABOLIC PANEL
Calcium: 9.7 mg/dL (ref 8.4–10.5)
Creatinine, Ser: 0.56 mg/dL (ref 0.50–1.10)
GFR calc Af Amer: >90 mL/min (ref >90)
GFR calc non Af Amer: >90 mL/min (ref >90)

## 2011-10-06 MED ORDER — PREDNISONE 10 MG PO TABS: ORAL_TABLET | ORAL | Status: DC

## 2011-10-06 MED ORDER — ASPIRIN 81 MG PO TBEC: 81.0000 mg | DELAYED_RELEASE_TABLET | Freq: Every day | ORAL | Status: AC

## 2011-10-06 MED ORDER — NITROGLYCERIN 0.4 MG SL SUBL: 0.4000 mg | SUBLINGUAL_TABLET | SUBLINGUAL | Status: DC | PRN

## 2011-10-06 MED ORDER — CEFUROXIME AXETIL 250 MG PO TABS: 250.0000 mg | ORAL_TABLET | Freq: Two times a day (BID) | ORAL | Status: AC

## 2011-10-06 MED ORDER — INFLUENZA VIRUS VACC SPLIT PF IM SUSP
0.5000 mL | Freq: Once | INTRAMUSCULAR | Status: AC
  Administered 2011-10-06: 0.5 mL via INTRAMUSCULAR
  Filled 2011-10-06: qty 0.5

## 2011-10-06 NOTE — Progress Notes (Signed)
Discharge instructions given and reviewed with patient and family member. Patient discharged home.  

## 2011-10-06 NOTE — Discharge Summary (Signed)
Discharge Summary   Patient ID: Sheila Pierce MRN: [id number], DOB/AGE: 03/04/1945 66 y.o. Admit date: 10/04/2011 D/C date:     10/06/2011  Primary Cardiologist: Mystic Labo  Primary Discharge Diagnoses:  1. NSTEMI likely secondary to demand ischemia - cardiac cath 10/05/11 with nonobstructive disease 2. Acute COPD exacerbation 3. Acute on chronic respiratory failure (COPD on home O2) 4. Abnormal EKG/QTc prolongation 5. HTN 6. Hypokalemia, repleted 7. Hyponatremia 8. Hyperglycemia, related to steroid use  Secondary Discharge Diagnoses:  1. Seasonal allergies 2. Hx of cholecystectomy 3. Hx of hysterectomy 4. GERD  Hospital Course: Sheila Pierce is a 66 year old female with a history of COPD, hypertension but no formerly diagnosed heart disease with a negative stress echo 02/2011. She initially presented overnight 9/8 with complaints of shortness of breath and wheezing. Her white count was normal at that time. Chest x-ray showed severe COPD but was not acute otherwise. She was treated with Solu-Medrol and albuterol and was discharged to home in improved condition. However, she returned later that evening, which was last night, with similar complaints. She became even more short of breath to the point where she was gasping for air. Her son attempted to turn her home oxygen up to 5 liters but she was still in distress, thus EMS was called. She was placed on BiPAP. Chest x-ray revealed severe COPD without active cardiopulmonary disease. She does report feeling some sinus congestion the days prior to this event. Sodium was low at 129 with an elevation in white count to 17,200 keeping in mind that she had recommended Solu-Medrol within that last 24 hours. Cardiac enzymes were obtained demonstrating an elevated troponin of up to 1.32, trending down to 1.11. Potassium was 2.7. She was given Lovenox full dose, nitroglycerin paste, and started on ceftriaxone and Solu-Medrol as well as albuterol nebulizers. At  time of cardiology evaluation at [facility] on 9/9, EKG demonstrated wide T wave inversions in 2, 3 and aVF as well as V4 though V6  QTc prolongation of 590 in the setting of K of 3.0. These findings were new from prior tracing. Mg was 1.8. She was given 1gm of Mg Sulfate and potassium was repleted both IV and PO. She endorsed chest discomfort like a soreness sensation when she was acutely gasping for air but otherwise denied this. She has chronic dyspnea. She was transferred to [facility] with plans for cardiac cath given her long tobacco hx and positive enzymes. This demonstrated: Left mainstem: Widely patent without significant stenosis.  Left anterior descending (LAD): The LAD is patent throughout. She has minimal luminal irregularity. There are 2 diagonals, both are small. The first diagonal has 40% ostial stenosis.  Left circumflex (LCx): The left circumflex is widely patent. There were 2 obtuse marginal branches without significant stenosis. The proximal left circumflex has mild irregularity noted.  Right coronary artery (RCA): This is a dominant vessel. The RCA is of moderate caliber. The PDA and PLA branches are patent. There is no significant stenosis present.  Left ventriculography: There is mild hypokinesis of the antero-apex. The left ventricular ejection fraction is estimated at 50%.  Final Conclusions:  1. Nonobstructive coronary artery disease  2. Mild segmental left ventricular systolic dysfunction Med rx was recommended. Today the patient is doing well. QTc improved by time of discharge. BMET today reveals K of 4.7, Na of 132 (lowest at 129 on admission) possibly related to underlying lung process. Dr. Antoine Poche recommended fluid restriction at home. He also recommended steroid taper & completion of  7-day course of antibiotics. Given her PCN allergy, I discussed the use of PO cephalosporins with pharmacy. The patient tolerated Ceftriaxone without adverse event while in the hospital  since 9/8. Per their recommendation, will send her home on Ceftin 250mg  BID. Note no BB due to her lung disease. She also had some elevated CBG while in the hospital likely related to steroid usage as HgbA1C was normal. Dr. Antoine Poche has seen and examined the patient and feels she is stable for discharge. We are holding quinipril at discharge given that her pressurehas been stable, at times on the lower end, while here in the hospital off of this medicine.   The pt was offered arrangement of home health services at discharge but wanted to wait to talk to her PCP about this tomorrow as she was eager to go home due to time constraints with her son's work.  Discharge Vitals: Blood pressure 137/59, pulse 91, temperature 97.5 F (36.4 C), temperature source Oral, resp. rate 18, height 5' (1.524 m), weight 103 lb 9.9 oz (47 kg), SpO2 95.00%.  Labs: Lab Results  Component Value Date   WBC 15.6* 10/05/2011   HGB 12.7 10/05/2011   HCT 36.8 10/05/2011   MCV 90.2 10/05/2011   PLT 282 10/05/2011     Lab 10/06/11 1157 10/05/11 0143  NA 132* --  K 4.7 --  CL 97 --  CO2 29 --  BUN 14 --  CREATININE 0.56 --  CALCIUM 9.7 --  PROT -- 5.6*  BILITOT -- 0.2*  ALKPHOS -- 51  ALT -- 17  AST -- 20  GLUCOSE 101* --    Lab Results  Component Value Date   CHOL 158 10/05/2011   HDL 54 10/05/2011   LDLCALC 89 10/05/2011   TRIG 75 10/05/2011    Diagnostic Studies/Procedures   See H&P and cath as above  Discharge Medications   Current Discharge Medication List    START taking these medications   Details  aspirin EC 81 MG EC tablet Take 1 tablet (81 mg total) by mouth daily.    cefUROXime (CEFTIN) 250 MG tablet Take 1 tablet (250 mg total) by mouth 2 (two) times daily. Qty: 6 tablet, Refills: 0   Comments: If you develop any swelling, itching, shortness of breath or any other worrisome symptoms while taking this, seek medical attention. Some people with penicillin allergies cannot take this medicine,  but you tolerated a medication in the same class (ceftriaxone) while in hospital without any reaction.    nitroGLYCERIN (NITROSTAT) 0.4 MG SL tablet Place 1 tablet (0.4 mg total) under the tongue every 5 (five) minutes as needed for chest pain (up to 3 doses). Qty: 25 tablet, Refills: 4      CONTINUE these medications which have CHANGED   Details  predniSONE (DELTASONE) 10 MG tablet Orally - Take 6 tablets daily for two days, then 5 tablets daily for two days, then 4 tablets daily for two days, then 3 tablets daily for two days, then 2 tablets daily for two days, then 1 tablet daily for two days then stop. Qty: 42 tablet, Refills: 0      CONTINUE these medications which have NOT CHANGED   Details  albuterol (PROVENTIL HFA;VENTOLIN HFA) 108 (90 BASE) MCG/ACT inhaler Inhale 2 puffs into the lungs every 4 (four) hours as needed. For shortness of breath.    Fluticasone-Salmeterol (ADVAIR) 250-50 MCG/DOSE AEPB Inhale 1 puff into the lungs every 12 (twelve) hours.    omeprazole (PRILOSEC) 20  MG capsule Take 20 mg by mouth 2 (two) times daily.      STOP taking these medications     azithromycin (ZITHROMAX) 250 MG tablet Comments:  Reason for Stopping:       quinapril (ACCUPRIL) 40 MG tablet Comments:  Reason for Stopping:           Disposition   The patient will be discharged in stable condition to home. Discharge Orders    Future Appointments: Provider: Department: Dept Phone: Center:   10/26/2011 4:15 PM Rollene Rotunda, MD [facility] [phone] [facility]     Future Orders Please Complete By Expires   Diet - low sodium heart healthy      Comments:   Restrict your fluid to no more than 1.5-2 liters of fluid per day (includes all sources including water, milk, coffee, tea, soups, etc).   Increase activity slowly      Comments:   Keep procedure site clean & dry. If you notice increased pain, swelling, bleeding or pus, call/return!  You may shower, but no soaking  baths/hot tubs/pools for 1 week. No driving for 1 week. No lifting over 10 lbs for 2 weeks. No sexual activity for 2 weeks.     Follow-up Information    Schedule an appointment as soon as possible for a visit with [facility], MD. (Very important! Please call primary doctor when you are discharged to schedule an appointment to be seen within 5 days.)    Contact information:   [address]  [place] [address] [phone]       Follow up with [facility]. (Take lab slip to [facility] on 10/13/11 to have electrolytes checked. Register at the main entrance.)       Follow up with Rollene Rotunda, MD. (10/26/11 at 4:15pm)    Contact information:   [address] [place], [place] [phone] [facility]           Duration of Discharge Encounter: Greater than 30 minutes including physician and PA time.  Signed, Ronie Spies PA-C 10/06/2011, 2:37 PM  Patient seen and examined.  Plan as discussed in my rounding note for yesterday and outlined above. Rollene Rotunda  10/07/2011  8:11 AM

## 2011-10-06 NOTE — Progress Notes (Signed)
    SUBJECTIVE:  Breathing is at baseline.  Cough improved.  No chest pain.     PHYSICAL EXAM Filed Vitals:   10/05/11 2300 10/06/11 0017 10/06/11 0300 10/06/11 0454  BP: 106/71 119/73 95/48 115/63  Pulse: 108 98 86 85  Temp:  98.4 F (36.9 C)  98.4 F (36.9 C)  TempSrc:  Oral  Oral  Resp:    22  Height:      Weight:      SpO2: 94% 97% 97% 95%   General:  No acute distress, frail Lungs:  Decreased breath sounds with wheezing Heart:  RRR Abdomen:  Positive bowel sounds, no rebound no guarding Extremities:  No edema, right groin without bruising or hematoma.   LABS: No results found for this basename: CKTOTAL,  CKMB,  CKMBINDEX,  TROPONINI   Results for orders placed during the hospital encounter of 10/04/11 (from the past 24 hour(s))  BASIC METABOLIC PANEL     Status: Abnormal   Collection Time   10/05/11  7:00 AM      Component Value Range   Sodium 131 (*) 135 - 145 mEq/L   Potassium 4.6  3.5 - 5.1 mEq/L   Chloride 97  96 - 112 mEq/L   CO2 27  19 - 32 mEq/L   Glucose, Bld 122 (*) 70 - 99 mg/dL   BUN 11  6 - 23 mg/dL   Creatinine, Ser 1.61  0.50 - 1.10 mg/dL   Calcium 9.2  8.4 - 09.6 mg/dL   GFR calc non Af Amer >90  >90 mL/min   GFR calc Af Amer >90  >90 mL/min  POCT ACTIVATED CLOTTING TIME     Status: Normal   Collection Time   10/05/11  5:36 PM      Component Value Range   Activated Clotting Time 104      Intake/Output Summary (Last 24 hours) at 10/06/11 0653 Last data filed at 10/06/11 0200  Gross per 24 hour  Intake   1131 ml  Output   1450 ml  Net   -319 ml    ASSESSMENT AND PLAN:  NQWMI - Cath yesterday with nonobstructive disease.  No further work up.    Hypokalemia - Supplemented.  Repeat BMET before discharge.  At home fluid restrict for low Na.  She needs a repeat BMET in one week.  COPD -  Discharge to home to complete a total 7 day course of antibiotics and a steroid dose pack taper.  She needs follow up with her primary provider within 5  days.  Hold quinapril at discharge.    Tobacco abuse - She has been educated and is not ready to quit   Rollene Rotunda 10/06/2011 6:53 AM

## 2011-10-13 ENCOUNTER — Encounter: Payer: Self-pay | Admitting: Physician Assistant

## 2011-10-15 ENCOUNTER — Telehealth: Payer: Self-pay | Admitting: *Deleted

## 2011-10-15 NOTE — Telephone Encounter (Signed)
Thanks for update

## 2011-10-15 NOTE — Telephone Encounter (Signed)
Patient was discharged from [facility] on 09/11 on prednisone to use for 12 days. Patient filled prescription and started it on 10/07/11. Patient informed nurse that she stopped taking the prednisone on yesterday because it was causing her to have aggressive behaviors and she felt like she was about to smack somebody in her house. Patient is scheduled to see Hochrein on 10/26/11 for post hospital visit.

## 2011-10-26 ENCOUNTER — Ambulatory Visit: Payer: PRIVATE HEALTH INSURANCE | Admitting: Cardiology

## 2011-11-24 ENCOUNTER — Ambulatory Visit (INDEPENDENT_AMBULATORY_CARE_PROVIDER_SITE_OTHER): Payer: Self-pay | Admitting: Physician Assistant

## 2011-11-24 ENCOUNTER — Encounter: Payer: Self-pay | Admitting: Physician Assistant

## 2011-11-24 VITALS — BP 160/90 | HR 96 | Ht 60.0 in | Wt 101.0 lb

## 2011-11-24 DIAGNOSIS — I2510 Atherosclerotic heart disease of native coronary artery without angina pectoris: Secondary | ICD-10-CM

## 2011-11-24 DIAGNOSIS — E785 Hyperlipidemia, unspecified: Secondary | ICD-10-CM | POA: Insufficient documentation

## 2011-11-24 DIAGNOSIS — I10 Essential (primary) hypertension: Secondary | ICD-10-CM

## 2011-11-24 DIAGNOSIS — R9431 Abnormal electrocardiogram [ECG] [EKG]: Secondary | ICD-10-CM

## 2011-11-24 DIAGNOSIS — E876 Hypokalemia: Secondary | ICD-10-CM

## 2011-11-24 DIAGNOSIS — J449 Chronic obstructive pulmonary disease, unspecified: Secondary | ICD-10-CM

## 2011-11-24 MED ORDER — QUINAPRIL HCL 40 MG PO TABS: 40.0000 mg | ORAL_TABLET | Freq: Every day | ORAL | Status: DC

## 2011-11-24 MED ORDER — ATORVASTATIN CALCIUM 40 MG PO TABS: 40.0000 mg | ORAL_TABLET | Freq: Every evening | ORAL | Status: DC

## 2011-11-24 NOTE — Assessment & Plan Note (Signed)
Normalized by followup EKG today, with QTC 457 ms

## 2011-11-24 NOTE — Assessment & Plan Note (Signed)
Patient to resume quinapril at previous dose of 40 mg daily, for treatment of HTN. Follow BMET in one week.

## 2011-11-24 NOTE — Patient Instructions (Addendum)
   Begin Quinapril 40mg  daily  Begin Lipitor 40mg  every evening   Lab:  BMET - do in 1 week (around November 6)  Labs - due in 12 weeks for fasting lipid and liver panel - will mail reminder in mail   Office will contact with results  Continue all other current medications. Follow up in  3 months

## 2011-11-24 NOTE — Progress Notes (Addendum)
Primary Cardiologist: Rollene Rotunda, MD   HPI: Post hospital followup from [facility], status post NST EMI (peak troponin 1.3), with no prior history of heart disease. She initially presented to [facility] with acute/chronic respiratory failure (COPD on home O2).   - Cardiac catheterization: Nonobstructive CAD; EF 50%, mild anteroapical HK  Patient also presented with elevated QTc interval (0.590) , in setting of hypokalemia (3.0) and hyponatremia (low 129), with magnesium 1.8.  Clinically, patient continues to report no CP. She denies any complications of the right groin incision site. She does have significant dyspnea from her COPD, and uses supplemental O2 both at night and as needed.  Followup labs as follows: Potassium 3.7, BUN 13, creatinine 0.5   Allergies  Allergen Reactions  . Penicillins Swelling  . Codeine   . Hydrocodone   . Sulfa Antibiotics     Current Outpatient Prescriptions  Medication Sig Dispense Refill  . albuterol (PROVENTIL HFA;VENTOLIN HFA) 108 (90 BASE) MCG/ACT inhaler Inhale 2 puffs into the lungs every 4 (four) hours as needed. For shortness of breath.      Marland Kitchen aspirin EC 81 MG EC tablet Take 1 tablet (81 mg total) by mouth daily.      Marland Kitchen atorvastatin (LIPITOR) 40 MG tablet Take 1 tablet (40 mg total) by mouth every evening.  30 tablet  6  . Fluticasone-Salmeterol (ADVAIR) 250-50 MCG/DOSE AEPB Inhale 1 puff into the lungs every 12 (twelve) hours.      . nitroGLYCERIN (NITROSTAT) 0.4 MG SL tablet Place 1 tablet (0.4 mg total) under the tongue every 5 (five) minutes as needed for chest pain (up to 3 doses).  25 tablet  4  . omeprazole (PRILOSEC) 20 MG capsule Take 20 mg by mouth 2 (two) times daily as needed.       . quinapril (ACCUPRIL) 40 MG tablet Take 1 tablet (40 mg total) by mouth daily.  30 tablet  6    Past Medical History  Diagnosis Date  . Hypertension   . COPD (chronic obstructive pulmonary disease)     Chronic resp failure on home O2  . Asthma   . GERD  (gastroesophageal reflux disease)   . Arthritis   . Anxiety   . QT prolongation     Noted on EKG 09/2011 (590 in setting of K of 3, improved to 475 by discharge)  . Hyponatremia     Noted 09/2011 admission.  . Hyperglycemia     Noted 09/2011 admission in setting of steroid use with normal HgbA1C.  Marland Kitchen NSTEMI (non-ST elevated myocardial infarction)     09/2011 in setting of acute on chronic resp failure/COPD exacerbation - cath demonstrated nonobstructive CAD 10/05/11 with EF 50%.  . Hypokalemia     Past Surgical History  Procedure Date  . Abdominal hysterectomy   . Plantar's wart excision   . Cholecystectomy     History   Social History  . Marital Status: Unknown    Spouse Name: N/A    Number of Children: N/A  . Years of Education: N/A   Occupational History  . Not on file.   Social History Main Topics  . Smoking status: Current Every Day Smoker -- 0.5 packs/day    Types: Cigarettes  . Smokeless tobacco: Not on file  . Alcohol Use: No  . Drug Use: No  . Sexually Active:    Other Topics Concern  . Not on file   Social History Narrative  . No narrative on file    No family  history on file.  ROS: no nausea, vomiting; no fever, chills; no melena, hematochezia; no claudication  PHYSICAL EXAM: BP 160/90  Pulse 96  Ht 5' (1.524 m)  Wt 101 lb (45.813 kg)  BMI 19.73 kg/m2  SpO2 94% GENERAL: 66 year old female; NAD HEENT: NCAT, PERRLA, EOMI; sclera clear; no xanthelasma NECK: palpable bilateral carotid pulses, no bruits; no JVD; no TM LUNGS: Diminished breath sounds throughout CARDIAC: RRR (S1, S2); no significant murmurs; no rubs or gallops ABDOMEN: soft, non-tender; intact BS EXTREMETIES: no significant peripheral edema SKIN: warm/dry; no obvious rash/lesions MUSCULOSKELETAL: no joint deformity NEURO: no focal deficit; NL affect   EKG: reviewed and available in Electronic Records   ASSESSMENT & PLAN:  CAD (coronary artery disease) Aggressive CRF  modification recommended. Patient to continue on low-dose ASA, indefinitely. NTG tablets were provided. We will also resume ACE inhibitor therapy with quinapril, which patient had been on the past. Recent labs indicated normal renal function and potassium. We'll order followup BNET in one week. Patient to return in 3 months, and establish with Dr. Antoine Poche.  Hypertension Patient to resume quinapril at previous dose of 40 mg daily, for treatment of HTN. Follow BMET in one week.   Hypokalemia Resolved by post hospital followup labs  Dyslipidemia Recent LDL 89. Will initiate statin therapy with Lipitor 40 mg daily, check followup FLP/LFT profile in 12 weeks. Aggressive management recommended with target LDL 70 or less, if feasible.  QT prolongation Normalized by followup EKG today, with QTC 457 ms  COPD (chronic obstructive pulmonary disease) On supplemental O2, followed by primary M.D. Beta blocker therapy not recommended.    Gene Kendell Gammon, PAC

## 2011-11-24 NOTE — Assessment & Plan Note (Signed)
Result by post hospital followup labs

## 2011-11-24 NOTE — Assessment & Plan Note (Signed)
Recent LDL 89. Will initiate statin therapy with Lipitor 40 mg daily, check followup FLP/LFT profile in 12 weeks. Aggressive management recommended with target LDL 70 or less, if feasible.

## 2011-11-24 NOTE — Assessment & Plan Note (Signed)
On supplemental O2, followed by primary M.D. Beta blocker therapy not recommended.

## 2011-11-24 NOTE — Assessment & Plan Note (Signed)
Aggressive CRF modification recommended. Patient to continue on low-dose ASA, indefinitely. NTG tablets were provided. We will also resume ACE inhibitor therapy with quinapril, which patient had been on the past. Recent labs indicated normal renal function and potassium. We'll order followup BNET in one week. Patient to return in 3 months, and establish with Dr. Antoine Poche.

## 2011-12-03 ENCOUNTER — Telehealth: Payer: Self-pay | Admitting: *Deleted

## 2011-12-03 NOTE — Telephone Encounter (Signed)
Notes Recorded by Lesle Chris, LPN on 96/0/4540 at 8:51 AM Patient notified of below. Will forward info to PMD.

## 2011-12-03 NOTE — Telephone Encounter (Signed)
Message copied by Lesle Chris on Fri Dec 03, 2011  8:53 AM ------      Message from: Prescott Parma C      Created: Thu Dec 02, 2011 11:13 AM       K 3.2, NL renal fxn. Pt not on a diuretic. Recommend she increase diet intake of K rich foods (ie tomatoes, bananas), and to f/u with primary MD.

## 2012-01-25 ENCOUNTER — Encounter: Payer: Self-pay | Admitting: Cardiology

## 2012-02-15 ENCOUNTER — Encounter: Payer: Self-pay | Admitting: Cardiovascular Disease

## 2012-02-15 ENCOUNTER — Ambulatory Visit (INDEPENDENT_AMBULATORY_CARE_PROVIDER_SITE_OTHER): Payer: Medicare Other | Admitting: Cardiovascular Disease

## 2012-02-15 VITALS — BP 164/76 | HR 97 | Ht 60.0 in | Wt 101.0 lb

## 2012-02-15 DIAGNOSIS — Z79899 Other long term (current) drug therapy: Secondary | ICD-10-CM

## 2012-02-15 DIAGNOSIS — I2510 Atherosclerotic heart disease of native coronary artery without angina pectoris: Secondary | ICD-10-CM

## 2012-02-15 DIAGNOSIS — E785 Hyperlipidemia, unspecified: Secondary | ICD-10-CM

## 2012-02-15 DIAGNOSIS — J449 Chronic obstructive pulmonary disease, unspecified: Secondary | ICD-10-CM

## 2012-02-15 DIAGNOSIS — I10 Essential (primary) hypertension: Secondary | ICD-10-CM

## 2012-02-15 MED ORDER — AMLODIPINE BESYLATE 5 MG PO TABS: 5.0000 mg | ORAL_TABLET | Freq: Every day | ORAL | Status: DC

## 2012-02-15 NOTE — Progress Notes (Signed)
HPI  This is a 67 year old female who is here today for a followup visit. She presented in September of 2013 with non-ST elevation myocardial infarction in the setting of acute COPD exacerbation. She underwent heart catheterization which showed mild nonobstructive coronary artery disease with an ejection fraction of 50%. She did not require revascularization and was treated medically. She has known history of severe COPD with active tobacco use. She also has hypertension. Quinapril was resumed during last visit for blood pressure control. Followup basic metabolic profile showed normal renal function. She denies any chest pain currently. She continues to have significant dyspnea likely related to her known history of severe COPD.  Allergies  Allergen Reactions  . Penicillins Swelling  . Codeine   . Hydrocodone   . Sulfa Antibiotics      Current Outpatient Prescriptions on File Prior to Visit  Medication Sig Dispense Refill  . albuterol (PROVENTIL HFA;VENTOLIN HFA) 108 (90 BASE) MCG/ACT inhaler Inhale 2 puffs into the lungs every 4 (four) hours as needed. For shortness of breath.      Marland Kitchen aspirin EC 81 MG EC tablet Take 1 tablet (81 mg total) by mouth daily.      Marland Kitchen atorvastatin (LIPITOR) 40 MG tablet Take 1 tablet (40 mg total) by mouth every evening.  30 tablet  6  . Fluticasone-Salmeterol (ADVAIR) 250-50 MCG/DOSE AEPB Inhale 1 puff into the lungs every 12 (twelve) hours.      . nitroGLYCERIN (NITROSTAT) 0.4 MG SL tablet Place 1 tablet (0.4 mg total) under the tongue every 5 (five) minutes as needed for chest pain (up to 3 doses).  25 tablet  4  . omeprazole (PRILOSEC) 20 MG capsule Take 20 mg by mouth 2 (two) times daily as needed.       . quinapril (ACCUPRIL) 40 MG tablet Take 1 tablet (40 mg total) by mouth daily.  30 tablet  6  . amLODipine (NORVASC) 5 MG tablet Take 1 tablet (5 mg total) by mouth daily.  30 tablet  6     Past Medical History  Diagnosis Date  . Hypertension   .  COPD (chronic obstructive pulmonary disease)     Chronic resp failure on home O2  . Asthma   . GERD (gastroesophageal reflux disease)   . Arthritis   . Anxiety   . QT prolongation     Noted on EKG 09/2011 (590 in setting of K of 3, improved to 475 by discharge)  . Hyponatremia     Noted 09/2011 admission.  . Hyperglycemia     Noted 09/2011 admission in setting of steroid use with normal HgbA1C.  Marland Kitchen NSTEMI (non-ST elevated myocardial infarction)     09/2011 in setting of acute on chronic resp failure/COPD exacerbation - cath demonstrated nonobstructive CAD 10/05/11 with EF 50%.  . Hypokalemia      Past Surgical History  Procedure Date  . Abdominal hysterectomy   . Plantar's wart excision   . Cholecystectomy      No family history on file.   History   Social History  . Marital Status: Unknown    Spouse Name: N/A    Number of Children: N/A  . Years of Education: N/A   Occupational History  . Not on file.   Social History Main Topics  . Smoking status: Current Every Day Smoker -- 0.5 packs/day for 40 years    Types: Cigarettes    Start date: 01/26/1964  . Smokeless tobacco: Never Used  . Alcohol  Use: No  . Drug Use: No  . Sexually Active: Not on file   Other Topics Concern  . Not on file   Social History Narrative  . No narrative on file     PHYSICAL EXAM   BP 164/76  Pulse 97  Ht 5' (1.524 m)  Wt 101 lb (45.813 kg)  BMI 19.73 kg/m2  Constitutional: She is oriented to person, place, and time. She appears well-developed and well-nourished. No distress. Strong smell of nicotine. HENT: No nasal discharge.  Head: Normocephalic and atraumatic.  Eyes: Pupils are equal and round. Right eye exhibits no discharge. Left eye exhibits no discharge.  Neck: Normal range of motion. Neck supple. No JVD present. No thyromegaly present.  Cardiovascular: Normal rate, regular rhythm, normal heart sounds. Exam reveals no gallop and no friction rub. No murmur heard.    Pulmonary/Chest: Effort normal and diminished breath sounds. No stridor. No respiratory distress. She has no wheezes. She has no rales. She exhibits no tenderness.  Abdominal: Soft. Bowel sounds are normal. She exhibits no distension. There is no tenderness. There is no rebound and no guarding.  Musculoskeletal: Normal range of motion. She exhibits no edema and no tenderness.  Neurological: She is alert and oriented to person, place, and time. Coordination normal.  Skin: Skin is warm and dry. No rash noted. She is not diaphoretic. No erythema. No pallor.  Psychiatric: She has a normal mood and affect. Her behavior is normal. Judgment and thought content normal.      ASSESSMENT AND PLAN

## 2012-02-15 NOTE — Assessment & Plan Note (Signed)
She was started on atorvastatin last year after her myocardial infarction. I will order a followup lipid and liver profile

## 2012-02-15 NOTE — Assessment & Plan Note (Signed)
I strongly advised her to quit smoking. She reports inability to do that at this time due to strong anxiety.

## 2012-02-15 NOTE — Assessment & Plan Note (Signed)
Her blood pressure remains elevated. She reports being on clonidine in the past. However, I would like to avoid that medication if possible. Thus, I will start her on amlodipine 5 mg daily.

## 2012-02-15 NOTE — Assessment & Plan Note (Signed)
Mild nonobstructive on cardiac catheterization. Continue medical therapy. She has no chest pain.

## 2012-02-15 NOTE — Patient Instructions (Addendum)
Fasting lipid and liver profile.  Start Amlodipine 5 mg once daily for high blood pressure.  Follow up in 6 months.

## 2012-03-13 ENCOUNTER — Encounter: Payer: Self-pay | Admitting: *Deleted

## 2012-08-04 DIAGNOSIS — R0602 Shortness of breath: Secondary | ICD-10-CM

## 2012-09-16 ENCOUNTER — Encounter (HOSPITAL_COMMUNITY): Payer: Self-pay | Admitting: *Deleted

## 2012-09-16 ENCOUNTER — Inpatient Hospital Stay (HOSPITAL_COMMUNITY)
Admission: AD | Admit: 2012-09-16 | Discharge: 2012-09-19 | DRG: 190 | Disposition: A | Discharge status: Home / Self Care | Payer: Medicare Other | Source: Other Acute Inpatient Hospital | Attending: Cardiology | Admitting: Cardiology

## 2012-09-16 DIAGNOSIS — J441 Chronic obstructive pulmonary disease with (acute) exacerbation: Principal | ICD-10-CM | POA: Diagnosis present

## 2012-09-16 DIAGNOSIS — R748 Abnormal levels of other serum enzymes: Secondary | ICD-10-CM | POA: Diagnosis present

## 2012-09-16 DIAGNOSIS — I472 Ventricular tachycardia, unspecified: Secondary | ICD-10-CM | POA: Diagnosis not present

## 2012-09-16 DIAGNOSIS — I10 Essential (primary) hypertension: Secondary | ICD-10-CM | POA: Diagnosis present

## 2012-09-16 DIAGNOSIS — I4729 Other ventricular tachycardia: Secondary | ICD-10-CM | POA: Diagnosis not present

## 2012-09-16 DIAGNOSIS — K219 Gastro-esophageal reflux disease without esophagitis: Secondary | ICD-10-CM | POA: Diagnosis present

## 2012-09-16 DIAGNOSIS — R0602 Shortness of breath: Secondary | ICD-10-CM

## 2012-09-16 DIAGNOSIS — I252 Old myocardial infarction: Secondary | ICD-10-CM

## 2012-09-16 DIAGNOSIS — F17200 Nicotine dependence, unspecified, uncomplicated: Secondary | ICD-10-CM | POA: Diagnosis present

## 2012-09-16 DIAGNOSIS — I2510 Atherosclerotic heart disease of native coronary artery without angina pectoris: Secondary | ICD-10-CM | POA: Diagnosis present

## 2012-09-16 DIAGNOSIS — R0989 Other specified symptoms and signs involving the circulatory and respiratory systems: Secondary | ICD-10-CM

## 2012-09-16 DIAGNOSIS — Z79899 Other long term (current) drug therapy: Secondary | ICD-10-CM

## 2012-09-16 DIAGNOSIS — R0609 Other forms of dyspnea: Secondary | ICD-10-CM

## 2012-09-16 DIAGNOSIS — Z7982 Long term (current) use of aspirin: Secondary | ICD-10-CM

## 2012-09-16 DIAGNOSIS — F411 Generalized anxiety disorder: Secondary | ICD-10-CM | POA: Diagnosis present

## 2012-09-16 DIAGNOSIS — I214 Non-ST elevation (NSTEMI) myocardial infarction: Secondary | ICD-10-CM | POA: Diagnosis present

## 2012-09-16 DIAGNOSIS — J449 Chronic obstructive pulmonary disease, unspecified: Secondary | ICD-10-CM

## 2012-09-16 DIAGNOSIS — M129 Arthropathy, unspecified: Secondary | ICD-10-CM | POA: Diagnosis present

## 2012-09-16 DIAGNOSIS — E785 Hyperlipidemia, unspecified: Secondary | ICD-10-CM | POA: Diagnosis present

## 2012-09-16 LAB — MRSA PCR SCREENING: MRSA by PCR: NEGATIVE

## 2012-09-16 MED ORDER — PREDNISONE (PAK) 10 MG PO TABS
10.0000 mg | ORAL_TABLET | Freq: Four times a day (QID) | ORAL | Status: DC
  Administered 2012-09-19 (×2): 10 mg via ORAL

## 2012-09-16 MED ORDER — QUINAPRIL HCL 10 MG PO TABS: 40.0000 mg | ORAL_TABLET | Freq: Every day | ORAL | Status: DC

## 2012-09-16 MED ORDER — SODIUM CHLORIDE 0.9 % IV SOLN
INTRAVENOUS | Status: DC | PRN
  Administered 2012-09-16: 22:00:00 via INTRAVENOUS

## 2012-09-16 MED ORDER — PREDNISONE (PAK) 10 MG PO TABS
20.0000 mg | ORAL_TABLET | Freq: Every morning | ORAL | Status: AC
  Administered 2012-09-17: 20 mg via ORAL
  Filled 2012-09-16: qty 21

## 2012-09-16 MED ORDER — ASPIRIN EC 81 MG PO TBEC
81.0000 mg | DELAYED_RELEASE_TABLET | Freq: Every day | ORAL | Status: DC
  Administered 2012-09-16 – 2012-09-19 (×4): 81 mg via ORAL
  Filled 2012-09-16 (×4): qty 1

## 2012-09-16 MED ORDER — PREDNISONE (PAK) 10 MG PO TABS
10.0000 mg | ORAL_TABLET | ORAL | Status: AC
  Administered 2012-09-17: 10 mg via ORAL

## 2012-09-16 MED ORDER — PREDNISONE (PAK) 10 MG PO TABS
10.0000 mg | ORAL_TABLET | Freq: Three times a day (TID) | ORAL | Status: AC
  Administered 2012-09-18 (×3): 10 mg via ORAL

## 2012-09-16 MED ORDER — ALBUTEROL SULFATE (5 MG/ML) 0.5% IN NEBU
2.5000 mg | INHALATION_SOLUTION | RESPIRATORY_TRACT | Status: DC
  Administered 2012-09-16 – 2012-09-17 (×6): 2.5 mg via RESPIRATORY_TRACT
  Filled 2012-09-16 (×6): qty 0.5

## 2012-09-16 MED ORDER — PREDNISONE (PAK) 10 MG PO TABS
20.0000 mg | ORAL_TABLET | Freq: Every evening | ORAL | Status: AC
  Administered 2012-09-18: 20 mg via ORAL

## 2012-09-16 MED ORDER — LISINOPRIL 40 MG PO TABS
40.0000 mg | ORAL_TABLET | Freq: Every day | ORAL | Status: DC
  Administered 2012-09-16 – 2012-09-18 (×3): 40 mg via ORAL
  Filled 2012-09-16 (×4): qty 1

## 2012-09-16 MED ORDER — MOMETASONE FURO-FORMOTEROL FUM 100-5 MCG/ACT IN AERO
2.0000 | INHALATION_SPRAY | Freq: Two times a day (BID) | RESPIRATORY_TRACT | Status: DC
  Administered 2012-09-16 – 2012-09-19 (×6): 2 via RESPIRATORY_TRACT
  Filled 2012-09-16: qty 8.8

## 2012-09-16 MED ORDER — PREDNISONE (PAK) 10 MG PO TABS
20.0000 mg | ORAL_TABLET | Freq: Every evening | ORAL | Status: AC
  Administered 2012-09-17: 20 mg via ORAL

## 2012-09-16 MED ORDER — PANTOPRAZOLE SODIUM 40 MG PO TBEC
40.0000 mg | DELAYED_RELEASE_TABLET | Freq: Every day | ORAL | Status: DC
  Administered 2012-09-16 – 2012-09-19 (×4): 40 mg via ORAL
  Filled 2012-09-16 (×5): qty 1

## 2012-09-16 MED ORDER — DULOXETINE HCL 20 MG PO CPEP
20.0000 mg | ORAL_CAPSULE | Freq: Two times a day (BID) | ORAL | Status: DC
  Administered 2012-09-16 – 2012-09-19 (×6): 20 mg via ORAL
  Filled 2012-09-16 (×7): qty 1

## 2012-09-16 NOTE — Plan of Care (Signed)
Problem: Consults Goal: Tobacco Cessation referral if indicated Outcome: Progressing Smoking cessation handouts provided.

## 2012-09-16 NOTE — H&P (Signed)
CARDIOLOGY ADMISSION NOTE  Patient ID: Sheila Pierce MRN: [id number] DOB/AGE: 1945-08-10 67 y.o.  Admit date: 09/16/2012 Primary Physician   Kirstie Peri, MD Primary Cardiologist   Dr. Kirke Corin in [place] Chief Complaint    Dyspnea  HPI:  The patient has a history of NQWMI.  Cath 9/13 demonstrated nonobstructive CAD with first diagonal 40% stenosis. with EF 50%.   She has chronic COPD is is on home O2 3 L. She has a chronic cough. This is productive of white sputum. She said last night she had increasing dyspnea which was somewhat acute. She said she started coughing but was unable to produce her usual sputum she had some tightness with coughing. This is typically a sign for her to seek medical attention.  The patient presented to [facility] via EMS for treatment of acute SOB. In the ambuulance she will was treated with methylprednisolone. In the ER she was treated with albuterol and antibiotics for probable COPD exacerbatipn.  She was started on BiPAP and admitted to the third floor.  She was weaned to [facility] She was noted to have a very mild BNP elevation 232. There are some rhythm strips and demonstrating a nonsustained wide and in her. Her CK-MB has been normal but her troponin was mildly elevated at 0.52. In the ER at [facility] she did have wide complex tachycardia nonsustained. The patient states she was watching TV apparently when she had this dysrhythmia and did not feel this. She denies any chest pressure neck or arm discomfort. She thinks her breathing might be slightly better than when he came in. She's not describing any PND or orthopnea. At home she hadn't been having any acute cardiac symptoms. She has her chronic dyspnea and home O2 dependence.       Past Medical History  Diagnosis Date  . Hypertension   . COPD (chronic obstructive pulmonary disease)     Chronic resp failure on home O2  . Asthma   . GERD (gastroesophageal reflux disease)   . Arthritis   . Anxiety   . QT prolongation    Noted on EKG 09/2011 (590 in setting of K of 3, improved to 475 by discharge)  . Hyponatremia     Noted 09/2011 admission.  . Hyperglycemia     Noted 09/2011 admission in setting of steroid use with normal HgbA1C.  Marland Kitchen NSTEMI (non-ST elevated myocardial infarction)     09/2011 in setting of acute on chronic resp failure/COPD exacerbation - cath demonstrated nonobstructive CAD 10/05/11 with EF 50%.  . Hypokalemia     Past Surgical History  Procedure Laterality Date  . Abdominal hysterectomy    . Plantar's wart excision    . Cholecystectomy      Allergies  Allergen Reactions  . Penicillins Swelling  . Codeine   . Hydrocodone   . Sulfa Antibiotics    No current facility-administered medications on file prior to encounter.   Current Outpatient Prescriptions on File Prior to Encounter  Medication Sig Dispense Refill  . albuterol (PROVENTIL HFA;VENTOLIN HFA) 108 (90 BASE) MCG/ACT inhaler Inhale 2 puffs into the lungs every 4 (four) hours as needed. For shortness of breath.      Marland Kitchen aspirin EC 81 MG EC tablet Take 1 tablet (81 mg total) by mouth daily.      . nitroGLYCERIN (NITROSTAT) 0.4 MG SL tablet Place 1 tablet (0.4 mg total) under the tongue every 5 (five) minutes as needed for chest pain (up to 3 doses).  25 tablet  4  . Symbacort       . quinapril (ACCUPRIL) 40 MG tablet Take 1 tablet (40 mg total) by mouth daily.  30 tablet  6   History   Social History  . Marital Status: Unknown    Spouse Name: N/A    Number of Children: N/A  . Years of Education: N/A   Occupational History  . Not on file.   Social History Main Topics  . Smoking status: Current Every Day Smoker -- 0.50 packs/day for 40 years    Types: Cigarettes    Start date: 01/26/1964  . Smokeless tobacco: Never Used  . Alcohol Use: No  . Drug Use: No  . Sexual Activity: Not on file   Other Topics Concern  . Not on file   Social History Narrative  . No narrative on file    No family history on file.   ROS:   Diarrhea recently, weight loss.  Otherwise, as stated in the HPI and negative for all other systems.  Physical Exam: BP 112/64  Pulse 89  Temp(Src) 98.1 F (36.7 C) (Oral)  Resp 20  Ht 5' (1.524 m)  Wt 94 lb 2.2 oz (42.7 kg)  BMI 18.38 kg/m2  SpO2 97% GENERAL:  Fraill appearing  HEENT:  Pupils equal round and reactive, fundi not visualized, oral mucosa unremarkable NECK:  No jugular venous distention, waveform within normal limits, carotid upstroke brisk and symmetric, no bruits, no thyromegaly LYMPHATICS:  No cervical, inguinal adenopathy LUNGS:  Clear to auscultation bilaterally BACK:  No CVA tenderness CHEST:  Unremarkable HEART:  PMI not displaced or sustained,S1 and S2 within normal limits, no S3, no S4, no clicks, no rubs, no murmurs ABD:  Flat, positive bowel sounds normal in frequency in pitch, no bruits, no rebound, no guarding, no midline pulsatile mass, no hepatomegaly, no splenomegaly EXT:  2 plus pulses throughout, no edema, no cyanosis no clubbing SKIN:  No rashes no nodules NEURO:  Cranial nerves II through XII grossly intact, motor grossly intact throughout PSYCH:  Cognitively intact, oriented to person place and time  Labs:   Creatinine 173 calcium 9.1, sodium 134, potassium 4.2, ABG pH 7.37, PCO2 56, bicarbonate 32.4 white blood cell 4.3 hemoglobin 12.7, platelets 162   Radiology:  CXR:  Emphysematous changes peribronchial thickening with chronic bronchitis  EKG:  Normal sinus rhythm, rate 95, axis within normal limits, intervals within normal limits, poor anterior R wave progression. 09/16/2012  ASSESSMENT AND PLAN:    DYSPNEA:  This appears to be an acute on chronic COPD exacerbation.  However, not sure that she is actively infected. I will give her a steroid taper and increase her albuterol.  ELEVATED ENZYMES:  This is similar to the admission in September when he had elevated troponins but was found to have nonobstructive coronary disease. This is likely  demand ischemia. I will trend enzymes and for the short-term use IV heparin. However, I do not suspect that further invasive evaluation will be needed. I will repeat echocardiogram to see any change in her mild LV dysfunction.  TOBACCO ABUSE:  We will again try to educate.   SignedRollene Rotunda 09/16/2012, 9:44 PM

## 2012-09-17 DIAGNOSIS — J4489 Other specified chronic obstructive pulmonary disease: Secondary | ICD-10-CM

## 2012-09-17 DIAGNOSIS — J449 Chronic obstructive pulmonary disease, unspecified: Secondary | ICD-10-CM

## 2012-09-17 DIAGNOSIS — I219 Acute myocardial infarction, unspecified: Secondary | ICD-10-CM

## 2012-09-17 DIAGNOSIS — I517 Cardiomegaly: Secondary | ICD-10-CM

## 2012-09-17 LAB — TROPONIN I
Troponin I: 0.75 ng/mL (ref <0.30)
Troponin I: 0.38 ng/mL (ref <0.30)

## 2012-09-17 LAB — CBC
HCT: 35.6 % — ABNORMAL LOW (ref 36.0–46.0)
Hemoglobin: 11.8 g/dL — ABNORMAL LOW (ref 12.0–15.0)
MCHC: 33.1 g/dL (ref 30.0–36.0)
RBC: 3.99 MIL/uL (ref 3.87–5.11)

## 2012-09-17 MED ORDER — HEPARIN SODIUM (PORCINE) 5000 UNIT/ML IJ SOLN
5000.0000 [IU] | Freq: Three times a day (TID) | INTRAMUSCULAR | Status: DC
  Administered 2012-09-17 – 2012-09-18 (×6): 5000 [IU] via SUBCUTANEOUS
  Filled 2012-09-17 (×10): qty 1

## 2012-09-17 MED ORDER — BIOTENE DRY MOUTH MT LIQD
15.0000 mL | Freq: Two times a day (BID) | OROMUCOSAL | Status: DC
  Administered 2012-09-18 – 2012-09-19 (×3): 15 mL via OROMUCOSAL

## 2012-09-17 NOTE — Progress Notes (Signed)
CRITICAL VALUE ALERT  Critical value received:  Troponin = .75  Date of notification:  [id number]  Time of notification:  0047  Critical value read back:yes  Nurse who received alert:  G.Mayford Knife RN  MD notified (1st page):  Dr Antoine Poche  Time of first page:  0102  MD notified (2nd page):  Time of second page:  Responding MD:  Dr Antoine Poche  Time MD responded:  [phone]

## 2012-09-17 NOTE — Progress Notes (Signed)
Echo Lab  2D Echocardiogram completed.  Ahren Pettinger L Lara Palinkas, RDCS 09/17/2012 10:27 AM

## 2012-09-17 NOTE — Progress Notes (Signed)
   Subjective:  Denies CP; dyspnea some improvement.   Objective:  Filed Vitals:   09/17/12 0600 09/17/12 0700 09/17/12 0722 09/17/12 0736  BP: 120/49 120/58  144/61  Pulse: 86 92  98  Temp:    98 F (36.7 C)  TempSrc:    Oral  Resp: 28 26  27   Height:      Weight:      SpO2: 98% 95% 98% 98%    Intake/Output from previous day:  Intake/Output Summary (Last 24 hours) at 09/17/12 0752 Last data filed at 09/17/12 0700  Gross per 24 hour  Intake   1170 ml  Output   1200 ml  Net    -30 ml    Physical Exam: Physical exam: Well-developed chronically ill appearing in no acute distress.  Skin is warm and dry.  HEENT is normal.  Neck is supple.  Chest with diminished BS throughout Cardiovascular exam is regular rate and rhythm.  Abdominal exam nontender or distended. No masses palpated. Extremities show no edema. neuro grossly intact    Lab Results: CBC:  Recent Labs  09/17/12 0420  WBC 12.5*  HGB 11.8*  HCT 35.6*  MCV 89.2  PLT 186   Cardiac Enzymes:  Recent Labs  09/16/12 2318 09/17/12 0420  TROPONINI 0.75* 0.38*     Assessment/Plan:  1 Mildly elevated troponin - no clear trend up. No chest pain. Previous cath for similar presentation as outlined  In Dr Hochrein's note. Plan medical therapy. DC heparin. 2 COPD - continue pulmonary toilet including steroids. 3 NSVT - noted on telemetry at [facility]; no beta blocker given severe COPD. Recheck echo for LV function. 4 Tobacco abuse - patient counseled on discontinuing. Transfer to telemetry. Olga Millers 09/17/2012, 7:52 AM

## 2012-09-17 NOTE — Progress Notes (Signed)
Clinical Social Work Department BRIEF PSYCHOSOCIAL ASSESSMENT 09/17/2012  Patient:  Sheila Pierce, Sheila Pierce     Account Number:  [id number]     Admit date:  09/16/2012  Clinical Social Worker:  Illene Silver  Date/Time:  09/17/2012 02:51 PM  Referred by:  Physician  Date Referred:   Referred for  Crisis Intervention   Other Referral:   Interview type:  Patient Other interview type:    PSYCHOSOCIAL DATA Living Status:  FAMILY Admitted from facility:   Level of care:   Primary support name:   Primary support relationship to patient:  CHILD, ADULT Degree of support available:   Pt lives with son and grandson whom she reports as not being supportive.  Pt plans to move in with a neice in [place], [place] next month.  She reports being close to her neice, who is also a [employer] fundraiser.    CURRENT CONCERNS Current Concerns  Other - See comment   Other Concerns:   Pt is stressed because family doesn't help her at home and expects too much from her.    SOCIAL WORK ASSESSMENT / PLAN CSW spoke with pt and provided supportive listening.  Pt's current stress comes from her son/grandaughter and former DIL.  Pt doesn't have help/support at home that she needs and is continuously doing things for her son/grandson without any return.  Pt admits to smoking cigarettes to deal with her stress, as she refuses to take "nerve pills." Pt warned about the dangers of smoking while on oxygen.  Pt aware of safety concerns.  Emotional support offered.  CSW will continue to follow.   Assessment/plan status:  Psychosocial Support/Ongoing Assessment of Needs Other assessment/ plan:   Information/referral to community resources:    PATIENT'S/FAMILY'S RESPONSE TO PLAN OF CARE: Pt was calmer after CSW visit.  Appreciative of the concern for her from hospital staff.

## 2012-09-18 DIAGNOSIS — E785 Hyperlipidemia, unspecified: Secondary | ICD-10-CM

## 2012-09-18 MED ORDER — ALBUTEROL SULFATE (5 MG/ML) 0.5% IN NEBU
2.5000 mg | INHALATION_SOLUTION | Freq: Three times a day (TID) | RESPIRATORY_TRACT | Status: DC
  Administered 2012-09-18 – 2012-09-19 (×4): 2.5 mg via RESPIRATORY_TRACT
  Filled 2012-09-18 (×4): qty 0.5

## 2012-09-18 MED ORDER — ACETAMINOPHEN 325 MG PO TABS
650.0000 mg | ORAL_TABLET | Freq: Four times a day (QID) | ORAL | Status: DC | PRN
  Administered 2012-09-18: 650 mg via ORAL
  Filled 2012-09-18: qty 2

## 2012-09-18 MED ORDER — ENSURE COMPLETE PO LIQD
237.0000 mL | Freq: Two times a day (BID) | ORAL | Status: DC
  Administered 2012-09-18 – 2012-09-19 (×2): 237 mL via ORAL

## 2012-09-18 MED FILL — Albuterol Sulfate Soln Nebu 0.083% (2.5 MG/3ML): RESPIRATORY_TRACT | Qty: 3 | Status: AC

## 2012-09-18 NOTE — Progress Notes (Signed)
Utilization review completed.  

## 2012-09-18 NOTE — Progress Notes (Signed)
INITIAL NUTRITION ASSESSMENT  DOCUMENTATION CODES Per approved criteria  -Severe  malnutrition in the context of social or environmental circumstances -Underweight   INTERVENTION: Add Ensure Complete po BID, each supplement provides 350 kcal and 13 grams of protein. Discussed high calorie, high protein foods to incorporate into diet. RD to continue to follow nutrition care plan.  NUTRITION DIAGNOSIS: Inadequate oral intake related to limited food availability as evidenced by Sheila Pierce report and ongoing weight loss.   Goal: Intake to meet >90% of estimated nutrition needs.  Monitor:  weight trends, lab trends, I/O's, PO intake, supplement tolerance  Reason for Assessment: Malnutrition Screening Tool  67 y.o. female  Admitting Dx: acute-on-chronic COPD exacerbation  ASSESSMENT: PMHx significant for COPD, HTN, CAD, current smoker. Admitted with increasing dyspnea. Work-up reveals COPD exacerbation.  Per SW note, Sheila Pierce lives with son and grandson who are not supportive. Sheila Pierce has stressful living situation, reports that they doesn't get the help at home that she needs.  Sheila Pierce with 9% wt loss x 1 year. This is not significant for the time frame however Sheila Pierce does now meet criteria for "underweight." Sheila Pierce reports that she has limited access to food at home, which has attributed to her weight loss. She states that she hopes to go home to live with another family member (neice) to get better supportive care.   Nutrition Focused Physical Exam:  Subcutaneous Fat:  Orbital Region: moderately depleted Upper Arm Region: severely depleted Thoracic and Lumbar Region: severely depleted  Muscle:  Temple Region: WNL Clavicle Bone Region: severely depleted Clavicle and Acromion Bone Region: severely depleted Scapular Bone Region: N/A Dorsal Hand: N/A Patellar Region: severely depleted Anterior Thigh Region: severely depleted Posterior Calf Region: severely depleted  Edema: none  Sheila Pierce meets criteria for  severe MALNUTRITION in the context of social/environmental circumstances as evidenced by severe muscle and fat mass loss and suspected intake of <50% x at least 1 month.  Height: Ht Readings from Last 1 Encounters:  09/17/12 5' (1.524 m)    Weight: Wt Readings from Last 1 Encounters:  09/18/12 94 lb 11.2 oz (42.956 kg)    Ideal Body Weight: 100 lb  % Ideal Body Weight: 94%  Wt Readings from Last 10 Encounters:  09/18/12 94 lb 11.2 oz (42.956 kg)  02/15/12 101 lb (45.813 kg)  11/24/11 101 lb (45.813 kg)  10/04/11 103 lb 9.9 oz (47 kg)  10/04/11 103 lb 9.9 oz (47 kg)    Usual Body Weight: 103 lb  % Usual Body Weight: 91%  BMI:  Body mass index is 18.49 kg/(m^2). Underweight.  Estimated Nutritional Needs: Kcal: 1200 - 1500 Protein: 65 - 75 g Fluid: 1.2 - 1.5 liters  Skin: intact  Diet Order: Cardiac  EDUCATION NEEDS: -Education needs addressed   Intake/Output Summary (Last 24 hours) at 09/18/12 1026 Last data filed at 09/17/12 1700  Gross per 24 hour  Intake    240 ml  Output    650 ml  Net   -410 ml    Last BM: 8/23  Labs:  No results found for this basename: NA, K, CL, CO2, BUN, CREATININE, CALCIUM, MG, PHOS, GLUCOSE,  in the last 168 hours  CBG (last 3)  No results found for this basename: GLUCAP,  in the last 72 hours  Scheduled Meds: . albuterol  2.5 mg Nebulization TID  . antiseptic oral rinse  15 mL Mouth Rinse BID  . aspirin EC  81 mg Oral Daily  . DULoxetine  20 mg Oral  BID  . heparin subcutaneous  5,000 Units Subcutaneous Q8H  . lisinopril  40 mg Oral QHS  . mometasone-formoterol  2 puff Inhalation BID  . pantoprazole  40 mg Oral Daily  . predniSONE  10 mg Oral 3 x daily with food  . [START ON 09/19/2012] predniSONE  10 mg Oral 4X daily taper  . predniSONE  20 mg Oral Nightly    Continuous Infusions: . sodium chloride 10 mL/hr at 09/17/12 0800    Past Medical History  Diagnosis Date  . Hypertension   . COPD (chronic obstructive  pulmonary disease)     Chronic resp failure on home O2  . Asthma   . GERD (gastroesophageal reflux disease)   . Arthritis   . Anxiety   . QT prolongation     Noted on EKG 09/2011 (590 in setting of K of 3, improved to 475 by discharge)  . Hyponatremia     Noted 09/2011 admission.  . Hyperglycemia     Noted 09/2011 admission in setting of steroid use with normal HgbA1C.  Marland Kitchen NSTEMI (non-ST elevated myocardial infarction)     09/2011 in setting of acute on chronic resp failure/COPD exacerbation - cath demonstrated nonobstructive CAD 10/05/11 with EF 50%.  . Hypokalemia     Past Surgical History  Procedure Laterality Date  . Abdominal hysterectomy    . Plantar's wart excision    . Cholecystectomy      Jarold Motto MS, RD, LDN Pager: [phone] After-hours pager: [phone]

## 2012-09-18 NOTE — Progress Notes (Signed)
   Subjective:   Pt is a 67 yo with hx of mild CAD and recent NSTEMI ( Sept. 2013).   She was transferred from [facility] with increasing dyspnea and mildly increased troponin levels.   Denies CP; dyspnea some improvement.   Objective:  Filed Vitals:   09/17/12 2011 09/17/12 2036 09/17/12 2328 09/18/12 0550  BP:  141/88  148/70  Pulse:  89  54  Temp:  98.1 F (36.7 C)  98 F (36.7 C)  TempSrc:    Oral  Resp:  18    Height:      Weight:    94 lb 11.2 oz (42.956 kg)  SpO2: 98% 97% 97% 96%    Intake/Output from previous day:  Intake/Output Summary (Last 24 hours) at 09/18/12 0804 Last data filed at 09/17/12 1700  Gross per 24 hour  Intake    380 ml  Output    900 ml  Net   -520 ml    Physical Exam: Physical exam: Well-developed chronically ill appearing in no acute distress.  Skin is warm and dry.  HEENT is normal.  Neck is supple.  Chest with diminished BS throughout Cardiovascular exam is regular rate and rhythm.  Abdominal exam nontender or distended. No masses palpated. Extremities show no edema. neuro grossly intact    Lab Results: CBC:  Recent Labs  09/17/12 0420  WBC 12.5*  HGB 11.8*  HCT 35.6*  MCV 89.2  PLT 186   Cardiac Enzymes:  Recent Labs  09/16/12 2318 09/17/12 0420 09/17/12 1137  TROPONINI 0.75* 0.38* <0.30     Assessment/Plan:  1 Mildly elevated troponin - no clear trend up. No chest pain this am.   Previous cath for similar presentation as outlined  In Dr Hochrein's note. I suspect this is demand ischemia.  Her troponin levels are back down to normal.   Echo showed: Left ventricle: The cavity size was normal. Wall thickness was normal. Systolic function was normal. The estimated ejection fraction was in the range of 60% to 65%. Wall motion was normal; there were no regional wall motion abnormalities. Doppler parameters are consistent with abnormal left ventricular relaxation (grade 1 diastolic dysfunction). - Atrial  septum: There was increased thickness of the septum, consistent with lipomatous hypertrophy  Will have her ambulate in halls.  If she remains stable, will DC tomorrow.    2 COPD - continue pulmonary toilet including steroid taper.  3 NS[facility] - [facility]; no beta blocker given severe COPD. Recheck echo for LV function. 4 Tobacco abuse - patient counseled on discontinuing.    Elyn Aquas. 09/18/2012, 8:04 AM

## 2012-09-19 ENCOUNTER — Encounter (HOSPITAL_COMMUNITY): Payer: Self-pay | Admitting: Nurse Practitioner

## 2012-09-19 DIAGNOSIS — Z720 Tobacco use: Secondary | ICD-10-CM

## 2012-09-19 HISTORY — DX: Tobacco use: Z72.0

## 2012-09-19 MED ORDER — PREDNISONE (PAK) 10 MG PO TABS: ORAL_TABLET | ORAL | Status: DC

## 2012-09-19 NOTE — Discharge Summary (Signed)
Patient ID: Sheila Pierce,  MRN: [id number], DOB/AGE: [birth year] 67 y.o.  Admit date: 09/16/2012 Discharge date: 09/19/2012  Primary Care Provider: [facility] Primary Cardiologist: previously M. Kirke Corin, MD --> will by Dominga Ferry, MD   Discharge Diagnoses Principal Problem:   Acute exacerbation of chronic obstructive pulmonary disease (COPD)  **Managed with prednisone taper. Active Problems:   MI, acute, non ST segment elevation  **Flat troponin trend.  **NL EF by echo this admission.   Tobacco abuse   Hypertension   Dyslipidemia  Allergies Allergies  Allergen Reactions  . Penicillins Swelling  . Codeine Nausea Only  . Hydrocodone Nausea Only  . Sulfa Antibiotics     Hallucinations    Procedures  2D Echocardiogram 8.24.2014  Study Conclusions  - Left ventricle: The cavity size was normal. Wall thickness   was normal. Systolic function was normal. The estimated   ejection fraction was in the range of 60% to 65%. Wall   motion was normal; there were no regional wall motion   abnormalities. Doppler parameters are consistent with   abnormal left ventricular relaxation (grade 1 diastolic   dysfunction). - Atrial septum: There was increased thickness of the   septum, consistent with lipomatous hypertrophy. _____________   History of Present Illness  67 year old female with prior history of non-ST segment elevation myocardial infarction in the setting of acute on chronic respiratory failure in September of 2013. Diagnostic cardiac catheterization at that time showed nonobstructive CAD and she was medically managed. She has since had chronic dyspnea and uses home oxygen therapy. She also has a chronic cough. She was in her usual state of health until the evening prior to admission when her cough became productive with white sputum and dyspnea worsened. She presented to [facility] where she was placed on BiPAP and treated with antibiotic and inhaler therapy. While in the ED,  she apparently had a run of nonsustained wide-complex tachycardia, which was asymptomatic. Following admission, she was found to have mild elevation of troponin at 0.52, and decision was made to transfer her to [facility] for further evaluation.  Hospital Course  Following arrival to [facility], patient was maintained on inhaler therapy and also initiated on a steroid taper. Initial troponin here was 0.75 and subsequent troponins have trended down and normalized. Given similarity of presentation to her presentation in September of 2013 at which time showed elevated troponins and nonobstructive CAD on catheterization, decision was made to pursue noninvasive, 2-D echocardiogram to evaluate LV function. This was performed on August 24 and showed normal LV function with grade 1 diastolic dysfunction. In the absence of regional wall motion abnormalities as well as in the absence of chest pain, decision was made to continue medical therapy for what is presumed to be demand ischemia in the setting of acute on chronic exacerbation of obstructive pulmonary disease. Her dyspnea has slowly improved and she is felt to be stable for discharge today.  Of note, beta blocker therapy is not being used secondary to COPD.  Discharge Vitals Blood pressure 121/75, pulse 96, temperature 97.9 F (36.6 C), temperature source Oral, resp. rate 18, height 5' (1.524 m), weight 94 lb 11.2 oz (42.956 kg), SpO2 96.00%.  Filed Weights   09/16/12 2130 09/17/12 1103 09/18/12 0550  Weight: 94 lb 2.2 oz (42.7 kg) 94 lb 9.6 oz (42.91 kg) 94 lb 11.2 oz (42.956 kg)   Labs  CBC  Recent Labs  09/17/12 0420  WBC 12.5*  HGB 11.8*  HCT 35.6*  MCV  89.2  PLT 186   Cardiac Enzymes  Recent Labs  09/16/12 2318 09/17/12 0420 09/17/12 1137  TROPONINI 0.75* 0.38* <0.30   Disposition  Pt is being discharged home today in good condition.  Follow-up Plans & Appointments  Follow-up Information   Follow up with [facility], MD In  1 week.   Specialty:  Internal Medicine   Contact information:   [address]  [place] [address] [phone]       Follow up with Antoine Poche, MD On 10/20/2012. (1:20 PM)    Contact information:   [facility] [place] [phone]     Discharge Medications    Medication List         albuterol 108 (90 BASE) MCG/ACT inhaler  Commonly known as:  PROVENTIL HFA;VENTOLIN HFA  Inhale 2 puffs into the lungs every 4 (four) hours as needed. For shortness of breath.     aspirin 81 MG EC tablet  Take 1 tablet (81 mg total) by mouth daily.     budesonide-formoterol 160-4.5 MCG/ACT inhaler  Commonly known as:  SYMBICORT  Inhale 2 puffs into the lungs daily.     predniSONE 10 MG tablet  Commonly known as:  STERAPRED UNI-PAK  3 tabs on 8/27, 2 tabs on 8/28, 1 tab on 8/29, then discontinue.     quinapril 40 MG tablet  Commonly known as:  ACCUPRIL  Take 1 tablet (40 mg total) by mouth daily.      Outstanding Labs/Studies  None  Duration of Discharge Encounter   Greater than 30 minutes including physician time.  Signed, Nicolasa Ducking NP 09/19/2012, 11:39 AM   Patient seen and examined.  Plan as discussed in my rounding note for today and outlined above. Rollene Rotunda  09/19/2012  7:40 PM

## 2012-09-19 NOTE — Progress Notes (Signed)
Pt discharged to home per MD order. Pt received and reviewed all discharge instructions and medication information including follow-up appointments and prescription information and tobacco cessation education.  Pt verbalized understanding. Pt alert and oriented at discharge with no complaints of pain. Pt escorted to private vehicle via wheelchair by nurse tech. Joylene Grapes

## 2012-09-19 NOTE — Progress Notes (Signed)
   SUBJECTIVE:  No chest pain.  Breathing at baseline.  She wants to go home.    PHYSICAL EXAM Filed Vitals:   09/18/12 1619 09/18/12 2021 09/18/12 2045 09/19/12 0457  BP:   150/69 121/75  Pulse:   98 96  Temp:   98.1 F (36.7 C) 97.9 F (36.6 C)  TempSrc:   Oral Oral  Resp:   18 18  Height:      Weight:      SpO2: 95% 96% 98% 96%   General:  No distress Lungs:  Decreased breath sounds.  No crackles Heart:  RRR Abdomen:  Positive bowel sounds, no rebound no guarding Extremities:  No edema  LABS: Lab Results  Component Value Date   TROPONINI <0.30 09/17/2012   No results found for this or any previous visit (from the past 24 hour(s)).  Intake/Output Summary (Last 24 hours) at 09/19/12 1610 Last data filed at 09/19/12 0500  Gross per 24 hour  Intake    480 ml  Output    650 ml  Net   -170 ml    ASSESSMENT AND PLAN:  DYSPNEA:  At baseline.  Continue steroid taper and resume home meds at discharge.   ELEVATED TROPONIN:  EF unchanged. Demand ischemia.  No further invasive work up.  Troponins have trended down. No further wide complex tachycardia.   TOBACCO ABUSE:   Continue to educate.  She cannot afford Chantix.  She thinks that she will try patches.   Rollene Rotunda 09/19/2012 6:52 AM

## 2012-09-21 NOTE — Progress Notes (Signed)
Spoke with pt and offered emotional support.  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  She has extended family who are out of town who also assist her financially.  Pt has 2 sons locally, but pt doesn't get a long with their girlfriends, so she chooses not to stay with her sons much.  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  Pt has considered moving to [place] to stay with family and may follow through with that soon.  Pt's sister has wired her money for a hotel room at d/c.

## 2012-10-20 ENCOUNTER — Encounter: Payer: Self-pay | Admitting: Cardiology

## 2012-10-20 ENCOUNTER — Ambulatory Visit (INDEPENDENT_AMBULATORY_CARE_PROVIDER_SITE_OTHER): Payer: Medicare Other | Admitting: Cardiology

## 2012-10-20 VITALS — BP 155/74 | HR 95 | Ht 60.0 in | Wt 91.8 lb

## 2012-10-20 DIAGNOSIS — I214 Non-ST elevation (NSTEMI) myocardial infarction: Secondary | ICD-10-CM

## 2012-10-20 DIAGNOSIS — I10 Essential (primary) hypertension: Secondary | ICD-10-CM

## 2012-10-20 NOTE — Patient Instructions (Addendum)
Your physician recommends that you schedule a follow-up appointment in: 1 year with Dr. Branch. You should receive a letter in the mail in 10 months. If you do not receive this letter by July 2015 call our office to schedule this appointment.   Your physician recommends that you continue on your current medications as directed. Please refer to the Current Medication list given to you today.  

## 2012-10-20 NOTE — Progress Notes (Signed)
Clinical Summary Ms. Cobarrubias is a 67 y.o.female   1. NSTEMI - Admit 8/23-8/26/14 w/ COPD exacerbation, found to have elevated troponins. Similar presentation 09/2011, where diagnostic cath was done showing non-obstructive CAD -this admit noted to have a run of NSVT during admit, trop 0.75. Echo showed no new WMAs, unchanged LVEF. Patient was medically managed for demand ischemia in setting of COPD exacerbation   - never has chest pain. Breathing is back to baseline. No orthopnea, PND or lower extremity edema  2. HTN - does not check at home - compliant w/ meds  3. COPD - on home O2 - compliant w/ inhalers  Past Medical History  Diagnosis Date  . Hypertension   . COPD (chronic obstructive pulmonary disease)     Chronic resp failure on home O2  . Asthma   . GERD (gastroesophageal reflux disease)   . Arthritis   . Anxiety   . QT prolongation     Noted on EKG 09/2011 (590 in setting of K of 3, improved to 475 by discharge)  . Hyponatremia     Noted 09/2011 admission.  . Hyperglycemia     Noted 09/2011 admission in setting of steroid use with normal HgbA1C.  Marland Kitchen NSTEMI (non-ST elevated myocardial infarction)     a. 09/2011 in setting of acute on chronic resp failure/COPD exacerbation --> cath demonstrated nonobstructive CAD 10/05/11 with EF 50%;  08/2012 elevated Ti in setting of COPD flare -->Echo: EF 60-65%, Gr 1 DD -->Med Rx.  . Hypokalemia      Allergies  Allergen Reactions  . Penicillins Swelling  . Codeine Nausea Only  . Hydrocodone Nausea Only  . Sulfa Antibiotics     Hallucinations      Current Outpatient Prescriptions  Medication Sig Dispense Refill  . albuterol (PROVENTIL HFA;VENTOLIN HFA) 108 (90 BASE) MCG/ACT inhaler Inhale 2 puffs into the lungs every 4 (four) hours as needed. For shortness of breath.      . budesonide-formoterol (SYMBICORT) 160-4.5 MCG/ACT inhaler Inhale 2 puffs into the lungs daily.      . predniSONE (STERAPRED UNI-PAK) 10 MG tablet 3 tabs on  8/27, 2 tabs on 8/28, 1 tab on 8/29, then discontinue.  6 tablet  0  . quinapril (ACCUPRIL) 40 MG tablet Take 1 tablet (40 mg total) by mouth daily.  30 tablet  6   No current facility-administered medications for this visit.     Past Surgical History  Procedure Laterality Date  . Abdominal hysterectomy    . Plantar's wart excision    . Cholecystectomy       Allergies  Allergen Reactions  . Penicillins Swelling  . Codeine Nausea Only  . Hydrocodone Nausea Only  . Sulfa Antibiotics     Hallucinations       Family History  Problem Relation Age of Onset  . Cancer Father     Lung  . Cancer Mother     Liver     Social History Ms. Holstine reports that she has been smoking Cigarettes.  She started smoking about 48 years ago. She has a 20 pack-year smoking history. She has never used smokeless tobacco. Ms. Speckman reports that she does not drink alcohol.   Review of Systems 12 point ROS negative other than reported in HPI  Physical Examination p 95 bp 155/74 Wt 91 lbs BMI 18 Gen: resting comfortably, NAD HEENT: no scleral icterus, pupils equal round and reactive, no palptable cervical adenopathy CV: RRR, no m/r/g, no JVD, no carotid  bruits Pulm: CTAB Abd: soft, NT, ND NABS, no hepatosplenomegaly Ext: warm, no edema.  Skin: warm, no rash Neuro: A&Ox3, no focal deficits    Diagnostic Studies 09/17/12 Echo LVEF 60-65%, no WMA, grade I diastolic dysfunction,     Assessment and Plan   1. NSTEMI - demand ischemia in setting of COPD exacerbation, similar presentation last year with clean cath. Repeat echo during this admit shows normal LVEF, no new WMA - continue risk factor modification  2. HTN - elevated today, patient reports history of white coat hypertension - continue to follow bp upon follow up     Antoine Poche, M.D., F.A.C.C.

## 2013-07-29 ENCOUNTER — Encounter: Payer: Self-pay | Admitting: Cardiology

## 2013-07-30 ENCOUNTER — Encounter: Payer: Self-pay | Admitting: Cardiology

## 2013-08-04 ENCOUNTER — Encounter: Payer: Self-pay | Admitting: Cardiology

## 2013-10-10 ENCOUNTER — Encounter: Payer: Self-pay | Admitting: Cardiovascular Disease

## 2014-01-03 ENCOUNTER — Encounter (HOSPITAL_COMMUNITY): Payer: Self-pay | Admitting: Cardiovascular Disease

## 2014-03-12 ENCOUNTER — Telehealth: Payer: Self-pay | Admitting: Cardiology

## 2014-03-12 NOTE — Telephone Encounter (Signed)
User: Time: StatusMegan Salon [[id number]] 08/07/2013 8:16 AM New [10]   Donata Duff Slaughter [[id number]] 08/07/2013 8:17 AM Notification Sent [20]   Geraldine Contras [[id number]] 10/03/2013 12:52 PM Notification Sent [20]   Donata Duff Slaughter [[id number]] 10/12/2013 8:29 AM Notification Sent [20]   Donata Duff Slaughter [[id number]] 11/23/2013 10:03 AM Notification Sent [20]   Megan Salon [[id number]] 02/07/2014 11:57 AM Notification Sent [20]   Scheduling Instructions

## 2014-10-28 DIAGNOSIS — I10 Essential (primary) hypertension: Secondary | ICD-10-CM | POA: Diagnosis not present

## 2014-10-28 DIAGNOSIS — E785 Hyperlipidemia, unspecified: Secondary | ICD-10-CM | POA: Diagnosis not present

## 2014-10-28 DIAGNOSIS — J449 Chronic obstructive pulmonary disease, unspecified: Secondary | ICD-10-CM | POA: Diagnosis not present

## 2014-10-28 DIAGNOSIS — Z418 Encounter for other procedures for purposes other than remedying health state: Secondary | ICD-10-CM | POA: Diagnosis not present

## 2014-11-11 DIAGNOSIS — E785 Hyperlipidemia, unspecified: Secondary | ICD-10-CM | POA: Diagnosis not present

## 2014-11-11 DIAGNOSIS — J449 Chronic obstructive pulmonary disease, unspecified: Secondary | ICD-10-CM | POA: Diagnosis not present

## 2014-11-11 DIAGNOSIS — M792 Neuralgia and neuritis, unspecified: Secondary | ICD-10-CM | POA: Diagnosis not present

## 2014-11-11 DIAGNOSIS — J9610 Chronic respiratory failure, unspecified whether with hypoxia or hypercapnia: Secondary | ICD-10-CM | POA: Diagnosis not present

## 2014-11-20 DIAGNOSIS — J449 Chronic obstructive pulmonary disease, unspecified: Secondary | ICD-10-CM | POA: Diagnosis not present

## 2014-11-20 DIAGNOSIS — R112 Nausea with vomiting, unspecified: Secondary | ICD-10-CM | POA: Diagnosis not present

## 2014-11-20 DIAGNOSIS — I509 Heart failure, unspecified: Secondary | ICD-10-CM | POA: Diagnosis not present

## 2014-11-20 DIAGNOSIS — R0682 Tachypnea, not elsewhere classified: Secondary | ICD-10-CM | POA: Diagnosis not present

## 2014-11-20 DIAGNOSIS — R0602 Shortness of breath: Secondary | ICD-10-CM | POA: Diagnosis not present

## 2014-11-20 DIAGNOSIS — R748 Abnormal levels of other serum enzymes: Secondary | ICD-10-CM | POA: Diagnosis not present

## 2014-11-20 DIAGNOSIS — J441 Chronic obstructive pulmonary disease with (acute) exacerbation: Secondary | ICD-10-CM | POA: Diagnosis not present

## 2014-11-20 DIAGNOSIS — E875 Hyperkalemia: Secondary | ICD-10-CM | POA: Diagnosis not present

## 2014-11-20 DIAGNOSIS — R9431 Abnormal electrocardiogram [ECG] [EKG]: Secondary | ICD-10-CM | POA: Diagnosis not present

## 2014-11-21 DIAGNOSIS — Z66 Do not resuscitate: Secondary | ICD-10-CM | POA: Diagnosis not present

## 2014-11-21 DIAGNOSIS — R9431 Abnormal electrocardiogram [ECG] [EKG]: Secondary | ICD-10-CM | POA: Diagnosis not present

## 2014-11-21 DIAGNOSIS — R69 Illness, unspecified: Secondary | ICD-10-CM | POA: Diagnosis not present

## 2014-11-21 DIAGNOSIS — I509 Heart failure, unspecified: Secondary | ICD-10-CM | POA: Diagnosis not present

## 2014-11-21 DIAGNOSIS — M818 Other osteoporosis without current pathological fracture: Secondary | ICD-10-CM | POA: Diagnosis not present

## 2014-11-21 DIAGNOSIS — J438 Other emphysema: Secondary | ICD-10-CM | POA: Diagnosis not present

## 2014-11-21 DIAGNOSIS — I10 Essential (primary) hypertension: Secondary | ICD-10-CM | POA: Diagnosis not present

## 2014-11-21 DIAGNOSIS — E875 Hyperkalemia: Secondary | ICD-10-CM | POA: Diagnosis not present

## 2014-11-21 DIAGNOSIS — J441 Chronic obstructive pulmonary disease with (acute) exacerbation: Secondary | ICD-10-CM | POA: Diagnosis not present

## 2014-11-21 DIAGNOSIS — J449 Chronic obstructive pulmonary disease, unspecified: Secondary | ICD-10-CM | POA: Diagnosis not present

## 2014-11-21 DIAGNOSIS — Z9119 Patient's noncompliance with other medical treatment and regimen: Secondary | ICD-10-CM | POA: Diagnosis not present

## 2014-11-21 DIAGNOSIS — R748 Abnormal levels of other serum enzymes: Secondary | ICD-10-CM | POA: Diagnosis not present

## 2014-11-21 DIAGNOSIS — F17200 Nicotine dependence, unspecified, uncomplicated: Secondary | ICD-10-CM | POA: Diagnosis not present

## 2014-11-21 DIAGNOSIS — R0602 Shortness of breath: Secondary | ICD-10-CM | POA: Diagnosis not present

## 2014-11-21 DIAGNOSIS — E871 Hypo-osmolality and hyponatremia: Secondary | ICD-10-CM | POA: Diagnosis not present

## 2014-11-21 DIAGNOSIS — J9622 Acute and chronic respiratory failure with hypercapnia: Secondary | ICD-10-CM | POA: Diagnosis not present

## 2014-11-28 DIAGNOSIS — Z720 Tobacco use: Secondary | ICD-10-CM | POA: Diagnosis not present

## 2014-11-28 DIAGNOSIS — Z9981 Dependence on supplemental oxygen: Secondary | ICD-10-CM | POA: Diagnosis not present

## 2014-11-28 DIAGNOSIS — I509 Heart failure, unspecified: Secondary | ICD-10-CM | POA: Diagnosis not present

## 2014-11-28 DIAGNOSIS — J441 Chronic obstructive pulmonary disease with (acute) exacerbation: Secondary | ICD-10-CM | POA: Diagnosis not present

## 2014-11-28 DIAGNOSIS — I10 Essential (primary) hypertension: Secondary | ICD-10-CM | POA: Diagnosis not present

## 2014-11-30 DIAGNOSIS — Z720 Tobacco use: Secondary | ICD-10-CM | POA: Diagnosis not present

## 2014-11-30 DIAGNOSIS — I509 Heart failure, unspecified: Secondary | ICD-10-CM | POA: Diagnosis not present

## 2014-11-30 DIAGNOSIS — I10 Essential (primary) hypertension: Secondary | ICD-10-CM | POA: Diagnosis not present

## 2014-11-30 DIAGNOSIS — Z9981 Dependence on supplemental oxygen: Secondary | ICD-10-CM | POA: Diagnosis not present

## 2014-11-30 DIAGNOSIS — J441 Chronic obstructive pulmonary disease with (acute) exacerbation: Secondary | ICD-10-CM | POA: Diagnosis not present

## 2014-12-02 DIAGNOSIS — S41102A Unspecified open wound of left upper arm, initial encounter: Secondary | ICD-10-CM | POA: Diagnosis not present

## 2014-12-02 DIAGNOSIS — R0602 Shortness of breath: Secondary | ICD-10-CM | POA: Diagnosis not present

## 2014-12-02 DIAGNOSIS — L89303 Pressure ulcer of unspecified buttock, stage 3: Secondary | ICD-10-CM | POA: Diagnosis not present

## 2014-12-02 DIAGNOSIS — Z720 Tobacco use: Secondary | ICD-10-CM | POA: Diagnosis not present

## 2014-12-02 DIAGNOSIS — I10 Essential (primary) hypertension: Secondary | ICD-10-CM | POA: Diagnosis not present

## 2014-12-02 DIAGNOSIS — I509 Heart failure, unspecified: Secondary | ICD-10-CM | POA: Diagnosis not present

## 2014-12-02 DIAGNOSIS — J441 Chronic obstructive pulmonary disease with (acute) exacerbation: Secondary | ICD-10-CM | POA: Diagnosis not present

## 2014-12-02 DIAGNOSIS — L89300 Pressure ulcer of unspecified buttock, unstageable: Secondary | ICD-10-CM | POA: Diagnosis not present

## 2014-12-02 DIAGNOSIS — R069 Unspecified abnormalities of breathing: Secondary | ICD-10-CM | POA: Diagnosis not present

## 2014-12-02 DIAGNOSIS — Z9981 Dependence on supplemental oxygen: Secondary | ICD-10-CM | POA: Diagnosis not present

## 2014-12-03 DIAGNOSIS — E784 Other hyperlipidemia: Secondary | ICD-10-CM | POA: Diagnosis not present

## 2014-12-03 DIAGNOSIS — R0602 Shortness of breath: Secondary | ICD-10-CM | POA: Diagnosis not present

## 2014-12-03 DIAGNOSIS — K458 Other specified abdominal hernia without obstruction or gangrene: Secondary | ICD-10-CM | POA: Diagnosis not present

## 2014-12-03 DIAGNOSIS — R279 Unspecified lack of coordination: Secondary | ICD-10-CM | POA: Diagnosis not present

## 2014-12-03 DIAGNOSIS — Z9119 Patient's noncompliance with other medical treatment and regimen: Secondary | ICD-10-CM | POA: Diagnosis not present

## 2014-12-03 DIAGNOSIS — Z836 Family history of other diseases of the respiratory system: Secondary | ICD-10-CM | POA: Diagnosis not present

## 2014-12-03 DIAGNOSIS — J441 Chronic obstructive pulmonary disease with (acute) exacerbation: Secondary | ICD-10-CM | POA: Diagnosis not present

## 2014-12-03 DIAGNOSIS — J9622 Acute and chronic respiratory failure with hypercapnia: Secondary | ICD-10-CM | POA: Diagnosis not present

## 2014-12-03 DIAGNOSIS — Z7982 Long term (current) use of aspirin: Secondary | ICD-10-CM | POA: Diagnosis not present

## 2014-12-03 DIAGNOSIS — Z79899 Other long term (current) drug therapy: Secondary | ICD-10-CM | POA: Diagnosis not present

## 2014-12-03 DIAGNOSIS — Z7951 Long term (current) use of inhaled steroids: Secondary | ICD-10-CM | POA: Diagnosis not present

## 2014-12-03 DIAGNOSIS — Z880 Allergy status to penicillin: Secondary | ICD-10-CM | POA: Diagnosis not present

## 2014-12-03 DIAGNOSIS — I10 Essential (primary) hypertension: Secondary | ICD-10-CM | POA: Diagnosis not present

## 2014-12-03 DIAGNOSIS — Z885 Allergy status to narcotic agent status: Secondary | ICD-10-CM | POA: Diagnosis not present

## 2014-12-03 DIAGNOSIS — Z882 Allergy status to sulfonamides status: Secondary | ICD-10-CM | POA: Diagnosis not present

## 2014-12-03 DIAGNOSIS — Z7401 Bed confinement status: Secondary | ICD-10-CM | POA: Diagnosis not present

## 2014-12-04 DIAGNOSIS — J9622 Acute and chronic respiratory failure with hypercapnia: Secondary | ICD-10-CM | POA: Diagnosis not present

## 2014-12-04 DIAGNOSIS — J441 Chronic obstructive pulmonary disease with (acute) exacerbation: Secondary | ICD-10-CM | POA: Diagnosis not present

## 2014-12-05 DIAGNOSIS — J9622 Acute and chronic respiratory failure with hypercapnia: Secondary | ICD-10-CM | POA: Diagnosis not present

## 2014-12-05 DIAGNOSIS — J441 Chronic obstructive pulmonary disease with (acute) exacerbation: Secondary | ICD-10-CM | POA: Diagnosis not present

## 2014-12-06 DIAGNOSIS — J441 Chronic obstructive pulmonary disease with (acute) exacerbation: Secondary | ICD-10-CM | POA: Diagnosis not present

## 2014-12-06 DIAGNOSIS — J9622 Acute and chronic respiratory failure with hypercapnia: Secondary | ICD-10-CM | POA: Diagnosis not present

## 2014-12-07 DIAGNOSIS — J9622 Acute and chronic respiratory failure with hypercapnia: Secondary | ICD-10-CM | POA: Diagnosis not present

## 2014-12-08 DIAGNOSIS — J9622 Acute and chronic respiratory failure with hypercapnia: Secondary | ICD-10-CM | POA: Diagnosis not present

## 2014-12-09 DIAGNOSIS — J9622 Acute and chronic respiratory failure with hypercapnia: Secondary | ICD-10-CM | POA: Diagnosis not present

## 2014-12-09 DIAGNOSIS — J441 Chronic obstructive pulmonary disease with (acute) exacerbation: Secondary | ICD-10-CM | POA: Diagnosis not present

## 2014-12-10 DIAGNOSIS — Z7401 Bed confinement status: Secondary | ICD-10-CM | POA: Diagnosis not present

## 2014-12-10 DIAGNOSIS — R279 Unspecified lack of coordination: Secondary | ICD-10-CM | POA: Diagnosis not present

## 2014-12-10 DIAGNOSIS — E784 Other hyperlipidemia: Secondary | ICD-10-CM | POA: Diagnosis not present

## 2014-12-10 DIAGNOSIS — K458 Other specified abdominal hernia without obstruction or gangrene: Secondary | ICD-10-CM | POA: Diagnosis not present

## 2014-12-10 DIAGNOSIS — J441 Chronic obstructive pulmonary disease with (acute) exacerbation: Secondary | ICD-10-CM | POA: Diagnosis not present

## 2014-12-10 DIAGNOSIS — J9622 Acute and chronic respiratory failure with hypercapnia: Secondary | ICD-10-CM | POA: Diagnosis not present

## 2014-12-10 DIAGNOSIS — I10 Essential (primary) hypertension: Secondary | ICD-10-CM | POA: Diagnosis not present

## 2014-12-12 DIAGNOSIS — J9610 Chronic respiratory failure, unspecified whether with hypoxia or hypercapnia: Secondary | ICD-10-CM | POA: Diagnosis not present

## 2014-12-12 DIAGNOSIS — J449 Chronic obstructive pulmonary disease, unspecified: Secondary | ICD-10-CM | POA: Diagnosis not present

## 2014-12-27 DIAGNOSIS — E873 Alkalosis: Secondary | ICD-10-CM | POA: Diagnosis not present

## 2014-12-27 DIAGNOSIS — R000 Tachycardia, unspecified: Secondary | ICD-10-CM | POA: Diagnosis not present

## 2014-12-27 DIAGNOSIS — R0682 Tachypnea, not elsewhere classified: Secondary | ICD-10-CM | POA: Diagnosis not present

## 2014-12-27 DIAGNOSIS — L89313 Pressure ulcer of right buttock, stage 3: Secondary | ICD-10-CM | POA: Diagnosis not present

## 2014-12-27 DIAGNOSIS — R0602 Shortness of breath: Secondary | ICD-10-CM | POA: Diagnosis not present

## 2014-12-27 DIAGNOSIS — I10 Essential (primary) hypertension: Secondary | ICD-10-CM | POA: Diagnosis not present

## 2014-12-27 DIAGNOSIS — R2689 Other abnormalities of gait and mobility: Secondary | ICD-10-CM | POA: Diagnosis not present

## 2014-12-27 DIAGNOSIS — E785 Hyperlipidemia, unspecified: Secondary | ICD-10-CM | POA: Diagnosis not present

## 2014-12-27 DIAGNOSIS — R279 Unspecified lack of coordination: Secondary | ICD-10-CM | POA: Diagnosis not present

## 2014-12-27 DIAGNOSIS — J438 Other emphysema: Secondary | ICD-10-CM | POA: Diagnosis not present

## 2014-12-27 DIAGNOSIS — Z7401 Bed confinement status: Secondary | ICD-10-CM | POA: Diagnosis not present

## 2014-12-27 DIAGNOSIS — J449 Chronic obstructive pulmonary disease, unspecified: Secondary | ICD-10-CM | POA: Diagnosis not present

## 2014-12-27 DIAGNOSIS — E440 Moderate protein-calorie malnutrition: Secondary | ICD-10-CM | POA: Diagnosis not present

## 2014-12-27 DIAGNOSIS — Z681 Body mass index (BMI) 19 or less, adult: Secondary | ICD-10-CM | POA: Diagnosis not present

## 2014-12-27 DIAGNOSIS — R069 Unspecified abnormalities of breathing: Secondary | ICD-10-CM | POA: Diagnosis not present

## 2014-12-27 DIAGNOSIS — R748 Abnormal levels of other serum enzymes: Secondary | ICD-10-CM | POA: Diagnosis not present

## 2014-12-27 DIAGNOSIS — J441 Chronic obstructive pulmonary disease with (acute) exacerbation: Secondary | ICD-10-CM | POA: Diagnosis not present

## 2014-12-27 DIAGNOSIS — J45909 Unspecified asthma, uncomplicated: Secondary | ICD-10-CM | POA: Diagnosis not present

## 2014-12-27 DIAGNOSIS — I2510 Atherosclerotic heart disease of native coronary artery without angina pectoris: Secondary | ICD-10-CM | POA: Diagnosis not present

## 2014-12-27 DIAGNOSIS — J9602 Acute respiratory failure with hypercapnia: Secondary | ICD-10-CM | POA: Diagnosis not present

## 2014-12-27 DIAGNOSIS — M6281 Muscle weakness (generalized): Secondary | ICD-10-CM | POA: Diagnosis not present

## 2014-12-27 DIAGNOSIS — M818 Other osteoporosis without current pathological fracture: Secondary | ICD-10-CM | POA: Diagnosis not present

## 2014-12-27 DIAGNOSIS — Z780 Asymptomatic menopausal state: Secondary | ICD-10-CM | POA: Diagnosis not present

## 2014-12-29 DIAGNOSIS — J449 Chronic obstructive pulmonary disease, unspecified: Secondary | ICD-10-CM | POA: Diagnosis not present

## 2014-12-29 DIAGNOSIS — L89313 Pressure ulcer of right buttock, stage 3: Secondary | ICD-10-CM | POA: Diagnosis not present

## 2014-12-29 DIAGNOSIS — M6281 Muscle weakness (generalized): Secondary | ICD-10-CM | POA: Diagnosis not present

## 2014-12-29 DIAGNOSIS — J441 Chronic obstructive pulmonary disease with (acute) exacerbation: Secondary | ICD-10-CM | POA: Diagnosis not present

## 2014-12-29 DIAGNOSIS — J9602 Acute respiratory failure with hypercapnia: Secondary | ICD-10-CM | POA: Diagnosis not present

## 2014-12-29 DIAGNOSIS — J9610 Chronic respiratory failure, unspecified whether with hypoxia or hypercapnia: Secondary | ICD-10-CM | POA: Diagnosis not present

## 2014-12-29 DIAGNOSIS — R2689 Other abnormalities of gait and mobility: Secondary | ICD-10-CM | POA: Diagnosis not present

## 2014-12-29 DIAGNOSIS — I10 Essential (primary) hypertension: Secondary | ICD-10-CM | POA: Diagnosis not present

## 2014-12-29 DIAGNOSIS — E785 Hyperlipidemia, unspecified: Secondary | ICD-10-CM | POA: Diagnosis not present

## 2014-12-29 DIAGNOSIS — Z7401 Bed confinement status: Secondary | ICD-10-CM | POA: Diagnosis not present

## 2014-12-29 DIAGNOSIS — Z780 Asymptomatic menopausal state: Secondary | ICD-10-CM | POA: Diagnosis not present

## 2014-12-29 DIAGNOSIS — R279 Unspecified lack of coordination: Secondary | ICD-10-CM | POA: Diagnosis not present

## 2014-12-29 DIAGNOSIS — E873 Alkalosis: Secondary | ICD-10-CM | POA: Diagnosis not present

## 2014-12-29 DIAGNOSIS — I2510 Atherosclerotic heart disease of native coronary artery without angina pectoris: Secondary | ICD-10-CM | POA: Diagnosis not present

## 2014-12-29 DIAGNOSIS — E440 Moderate protein-calorie malnutrition: Secondary | ICD-10-CM | POA: Diagnosis not present

## 2014-12-29 DIAGNOSIS — R069 Unspecified abnormalities of breathing: Secondary | ICD-10-CM | POA: Diagnosis not present

## 2015-01-11 DIAGNOSIS — J449 Chronic obstructive pulmonary disease, unspecified: Secondary | ICD-10-CM | POA: Diagnosis not present

## 2015-01-11 DIAGNOSIS — J9610 Chronic respiratory failure, unspecified whether with hypoxia or hypercapnia: Secondary | ICD-10-CM | POA: Diagnosis not present

## 2015-01-20 DIAGNOSIS — I2510 Atherosclerotic heart disease of native coronary artery without angina pectoris: Secondary | ICD-10-CM | POA: Diagnosis not present

## 2015-01-20 DIAGNOSIS — Z8673 Personal history of transient ischemic attack (TIA), and cerebral infarction without residual deficits: Secondary | ICD-10-CM | POA: Diagnosis not present

## 2015-01-20 DIAGNOSIS — I10 Essential (primary) hypertension: Secondary | ICD-10-CM | POA: Diagnosis not present

## 2015-01-20 DIAGNOSIS — E440 Moderate protein-calorie malnutrition: Secondary | ICD-10-CM | POA: Diagnosis not present

## 2015-01-20 DIAGNOSIS — E785 Hyperlipidemia, unspecified: Secondary | ICD-10-CM | POA: Diagnosis not present

## 2015-01-20 DIAGNOSIS — Z9981 Dependence on supplemental oxygen: Secondary | ICD-10-CM | POA: Diagnosis not present

## 2015-01-20 DIAGNOSIS — R69 Illness, unspecified: Secondary | ICD-10-CM | POA: Diagnosis not present

## 2015-01-20 DIAGNOSIS — E873 Alkalosis: Secondary | ICD-10-CM | POA: Diagnosis not present

## 2015-01-20 DIAGNOSIS — J441 Chronic obstructive pulmonary disease with (acute) exacerbation: Secondary | ICD-10-CM | POA: Diagnosis not present

## 2015-01-22 DIAGNOSIS — E440 Moderate protein-calorie malnutrition: Secondary | ICD-10-CM | POA: Diagnosis not present

## 2015-01-22 DIAGNOSIS — R69 Illness, unspecified: Secondary | ICD-10-CM | POA: Diagnosis not present

## 2015-01-22 DIAGNOSIS — I2510 Atherosclerotic heart disease of native coronary artery without angina pectoris: Secondary | ICD-10-CM | POA: Diagnosis not present

## 2015-01-22 DIAGNOSIS — J441 Chronic obstructive pulmonary disease with (acute) exacerbation: Secondary | ICD-10-CM | POA: Diagnosis not present

## 2015-01-22 DIAGNOSIS — E873 Alkalosis: Secondary | ICD-10-CM | POA: Diagnosis not present

## 2015-01-22 DIAGNOSIS — E785 Hyperlipidemia, unspecified: Secondary | ICD-10-CM | POA: Diagnosis not present

## 2015-01-22 DIAGNOSIS — Z9981 Dependence on supplemental oxygen: Secondary | ICD-10-CM | POA: Diagnosis not present

## 2015-01-22 DIAGNOSIS — Z8673 Personal history of transient ischemic attack (TIA), and cerebral infarction without residual deficits: Secondary | ICD-10-CM | POA: Diagnosis not present

## 2015-01-22 DIAGNOSIS — I10 Essential (primary) hypertension: Secondary | ICD-10-CM | POA: Diagnosis not present

## 2015-01-23 DIAGNOSIS — I10 Essential (primary) hypertension: Secondary | ICD-10-CM | POA: Diagnosis not present

## 2015-01-23 DIAGNOSIS — Z8673 Personal history of transient ischemic attack (TIA), and cerebral infarction without residual deficits: Secondary | ICD-10-CM | POA: Diagnosis not present

## 2015-01-23 DIAGNOSIS — Z9981 Dependence on supplemental oxygen: Secondary | ICD-10-CM | POA: Diagnosis not present

## 2015-01-23 DIAGNOSIS — E873 Alkalosis: Secondary | ICD-10-CM | POA: Diagnosis not present

## 2015-01-23 DIAGNOSIS — J441 Chronic obstructive pulmonary disease with (acute) exacerbation: Secondary | ICD-10-CM | POA: Diagnosis not present

## 2015-01-23 DIAGNOSIS — E785 Hyperlipidemia, unspecified: Secondary | ICD-10-CM | POA: Diagnosis not present

## 2015-01-23 DIAGNOSIS — E440 Moderate protein-calorie malnutrition: Secondary | ICD-10-CM | POA: Diagnosis not present

## 2015-01-23 DIAGNOSIS — R69 Illness, unspecified: Secondary | ICD-10-CM | POA: Diagnosis not present

## 2015-01-23 DIAGNOSIS — I2510 Atherosclerotic heart disease of native coronary artery without angina pectoris: Secondary | ICD-10-CM | POA: Diagnosis not present

## 2015-01-24 DIAGNOSIS — E873 Alkalosis: Secondary | ICD-10-CM | POA: Diagnosis not present

## 2015-01-24 DIAGNOSIS — Z8673 Personal history of transient ischemic attack (TIA), and cerebral infarction without residual deficits: Secondary | ICD-10-CM | POA: Diagnosis not present

## 2015-01-24 DIAGNOSIS — E440 Moderate protein-calorie malnutrition: Secondary | ICD-10-CM | POA: Diagnosis not present

## 2015-01-24 DIAGNOSIS — E785 Hyperlipidemia, unspecified: Secondary | ICD-10-CM | POA: Diagnosis not present

## 2015-01-24 DIAGNOSIS — I2510 Atherosclerotic heart disease of native coronary artery without angina pectoris: Secondary | ICD-10-CM | POA: Diagnosis not present

## 2015-01-24 DIAGNOSIS — R69 Illness, unspecified: Secondary | ICD-10-CM | POA: Diagnosis not present

## 2015-01-24 DIAGNOSIS — J441 Chronic obstructive pulmonary disease with (acute) exacerbation: Secondary | ICD-10-CM | POA: Diagnosis not present

## 2015-01-24 DIAGNOSIS — I10 Essential (primary) hypertension: Secondary | ICD-10-CM | POA: Diagnosis not present

## 2015-01-24 DIAGNOSIS — Z9981 Dependence on supplemental oxygen: Secondary | ICD-10-CM | POA: Diagnosis not present

## 2015-01-27 DIAGNOSIS — J438 Other emphysema: Secondary | ICD-10-CM | POA: Diagnosis not present

## 2015-01-27 DIAGNOSIS — J449 Chronic obstructive pulmonary disease, unspecified: Secondary | ICD-10-CM | POA: Diagnosis not present

## 2015-01-27 DIAGNOSIS — M818 Other osteoporosis without current pathological fracture: Secondary | ICD-10-CM | POA: Diagnosis not present

## 2015-02-03 DIAGNOSIS — J440 Chronic obstructive pulmonary disease with acute lower respiratory infection: Secondary | ICD-10-CM | POA: Diagnosis not present

## 2015-02-03 DIAGNOSIS — R69 Illness, unspecified: Secondary | ICD-10-CM | POA: Diagnosis not present

## 2015-02-03 DIAGNOSIS — I10 Essential (primary) hypertension: Secondary | ICD-10-CM | POA: Diagnosis not present

## 2015-02-03 DIAGNOSIS — Z681 Body mass index (BMI) 19 or less, adult: Secondary | ICD-10-CM | POA: Diagnosis not present

## 2015-02-10 DIAGNOSIS — J449 Chronic obstructive pulmonary disease, unspecified: Secondary | ICD-10-CM | POA: Diagnosis not present

## 2015-02-10 DIAGNOSIS — J438 Other emphysema: Secondary | ICD-10-CM | POA: Diagnosis not present

## 2015-02-10 DIAGNOSIS — R32 Unspecified urinary incontinence: Secondary | ICD-10-CM | POA: Diagnosis not present

## 2015-02-10 DIAGNOSIS — M818 Other osteoporosis without current pathological fracture: Secondary | ICD-10-CM | POA: Diagnosis not present

## 2015-02-11 DIAGNOSIS — J9610 Chronic respiratory failure, unspecified whether with hypoxia or hypercapnia: Secondary | ICD-10-CM | POA: Diagnosis not present

## 2015-02-11 DIAGNOSIS — J449 Chronic obstructive pulmonary disease, unspecified: Secondary | ICD-10-CM | POA: Diagnosis not present

## 2015-02-27 DIAGNOSIS — J449 Chronic obstructive pulmonary disease, unspecified: Secondary | ICD-10-CM | POA: Diagnosis not present

## 2015-02-27 DIAGNOSIS — J438 Other emphysema: Secondary | ICD-10-CM | POA: Diagnosis not present

## 2015-02-27 DIAGNOSIS — M818 Other osteoporosis without current pathological fracture: Secondary | ICD-10-CM | POA: Diagnosis not present

## 2015-03-14 DIAGNOSIS — J449 Chronic obstructive pulmonary disease, unspecified: Secondary | ICD-10-CM | POA: Diagnosis not present

## 2015-03-14 DIAGNOSIS — J9610 Chronic respiratory failure, unspecified whether with hypoxia or hypercapnia: Secondary | ICD-10-CM | POA: Diagnosis not present

## 2015-03-27 DIAGNOSIS — J438 Other emphysema: Secondary | ICD-10-CM | POA: Diagnosis not present

## 2015-03-27 DIAGNOSIS — J449 Chronic obstructive pulmonary disease, unspecified: Secondary | ICD-10-CM | POA: Diagnosis not present

## 2015-03-27 DIAGNOSIS — M818 Other osteoporosis without current pathological fracture: Secondary | ICD-10-CM | POA: Diagnosis not present

## 2015-03-29 DIAGNOSIS — R69 Illness, unspecified: Secondary | ICD-10-CM | POA: Diagnosis not present

## 2015-04-11 DIAGNOSIS — J449 Chronic obstructive pulmonary disease, unspecified: Secondary | ICD-10-CM | POA: Diagnosis not present

## 2015-04-11 DIAGNOSIS — J9610 Chronic respiratory failure, unspecified whether with hypoxia or hypercapnia: Secondary | ICD-10-CM | POA: Diagnosis not present

## 2015-04-27 DIAGNOSIS — M818 Other osteoporosis without current pathological fracture: Secondary | ICD-10-CM | POA: Diagnosis not present

## 2015-04-27 DIAGNOSIS — J438 Other emphysema: Secondary | ICD-10-CM | POA: Diagnosis not present

## 2015-04-27 DIAGNOSIS — J449 Chronic obstructive pulmonary disease, unspecified: Secondary | ICD-10-CM | POA: Diagnosis not present

## 2015-04-29 DIAGNOSIS — R69 Illness, unspecified: Secondary | ICD-10-CM | POA: Diagnosis not present

## 2015-05-05 DIAGNOSIS — I10 Essential (primary) hypertension: Secondary | ICD-10-CM | POA: Diagnosis not present

## 2015-05-05 DIAGNOSIS — M25011 Hemarthrosis, right shoulder: Secondary | ICD-10-CM | POA: Diagnosis not present

## 2015-05-05 DIAGNOSIS — Z681 Body mass index (BMI) 19 or less, adult: Secondary | ICD-10-CM | POA: Diagnosis not present

## 2015-05-05 DIAGNOSIS — R69 Illness, unspecified: Secondary | ICD-10-CM | POA: Diagnosis not present

## 2015-05-05 DIAGNOSIS — J440 Chronic obstructive pulmonary disease with acute lower respiratory infection: Secondary | ICD-10-CM | POA: Diagnosis not present

## 2015-05-12 DIAGNOSIS — J9602 Acute respiratory failure with hypercapnia: Secondary | ICD-10-CM | POA: Diagnosis not present

## 2015-05-12 DIAGNOSIS — E440 Moderate protein-calorie malnutrition: Secondary | ICD-10-CM | POA: Diagnosis not present

## 2015-05-12 DIAGNOSIS — J9610 Chronic respiratory failure, unspecified whether with hypoxia or hypercapnia: Secondary | ICD-10-CM | POA: Diagnosis not present

## 2015-05-12 DIAGNOSIS — R918 Other nonspecific abnormal finding of lung field: Secondary | ICD-10-CM | POA: Diagnosis not present

## 2015-05-12 DIAGNOSIS — Z6820 Body mass index (BMI) 20.0-20.9, adult: Secondary | ICD-10-CM | POA: Diagnosis not present

## 2015-05-12 DIAGNOSIS — R0682 Tachypnea, not elsewhere classified: Secondary | ICD-10-CM | POA: Diagnosis not present

## 2015-05-12 DIAGNOSIS — J449 Chronic obstructive pulmonary disease, unspecified: Secondary | ICD-10-CM | POA: Diagnosis not present

## 2015-05-12 DIAGNOSIS — E785 Hyperlipidemia, unspecified: Secondary | ICD-10-CM | POA: Diagnosis not present

## 2015-05-12 DIAGNOSIS — J189 Pneumonia, unspecified organism: Secondary | ICD-10-CM | POA: Diagnosis not present

## 2015-05-12 DIAGNOSIS — I10 Essential (primary) hypertension: Secondary | ICD-10-CM | POA: Diagnosis not present

## 2015-05-12 DIAGNOSIS — J440 Chronic obstructive pulmonary disease with acute lower respiratory infection: Secondary | ICD-10-CM | POA: Diagnosis not present

## 2015-05-12 DIAGNOSIS — R0602 Shortness of breath: Secondary | ICD-10-CM | POA: Diagnosis not present

## 2015-05-12 DIAGNOSIS — J168 Pneumonia due to other specified infectious organisms: Secondary | ICD-10-CM | POA: Diagnosis not present

## 2015-05-12 DIAGNOSIS — J441 Chronic obstructive pulmonary disease with (acute) exacerbation: Secondary | ICD-10-CM | POA: Diagnosis not present

## 2015-05-12 DIAGNOSIS — E873 Alkalosis: Secondary | ICD-10-CM | POA: Diagnosis not present

## 2015-05-27 DIAGNOSIS — J449 Chronic obstructive pulmonary disease, unspecified: Secondary | ICD-10-CM | POA: Diagnosis not present

## 2015-05-27 DIAGNOSIS — J438 Other emphysema: Secondary | ICD-10-CM | POA: Diagnosis not present

## 2015-05-27 DIAGNOSIS — M818 Other osteoporosis without current pathological fracture: Secondary | ICD-10-CM | POA: Diagnosis not present

## 2015-05-30 DIAGNOSIS — J440 Chronic obstructive pulmonary disease with acute lower respiratory infection: Secondary | ICD-10-CM | POA: Diagnosis not present

## 2015-05-30 DIAGNOSIS — Z681 Body mass index (BMI) 19 or less, adult: Secondary | ICD-10-CM | POA: Diagnosis not present

## 2015-06-09 DIAGNOSIS — J440 Chronic obstructive pulmonary disease with acute lower respiratory infection: Secondary | ICD-10-CM | POA: Diagnosis not present

## 2015-06-10 DIAGNOSIS — J440 Chronic obstructive pulmonary disease with acute lower respiratory infection: Secondary | ICD-10-CM | POA: Diagnosis not present

## 2015-06-11 DIAGNOSIS — J9610 Chronic respiratory failure, unspecified whether with hypoxia or hypercapnia: Secondary | ICD-10-CM | POA: Diagnosis not present

## 2015-06-11 DIAGNOSIS — J440 Chronic obstructive pulmonary disease with acute lower respiratory infection: Secondary | ICD-10-CM | POA: Diagnosis not present

## 2015-06-11 DIAGNOSIS — J449 Chronic obstructive pulmonary disease, unspecified: Secondary | ICD-10-CM | POA: Diagnosis not present

## 2015-06-12 DIAGNOSIS — J440 Chronic obstructive pulmonary disease with acute lower respiratory infection: Secondary | ICD-10-CM | POA: Diagnosis not present

## 2015-06-13 DIAGNOSIS — J440 Chronic obstructive pulmonary disease with acute lower respiratory infection: Secondary | ICD-10-CM | POA: Diagnosis not present

## 2015-06-14 DIAGNOSIS — J440 Chronic obstructive pulmonary disease with acute lower respiratory infection: Secondary | ICD-10-CM | POA: Diagnosis not present

## 2015-06-15 DIAGNOSIS — J440 Chronic obstructive pulmonary disease with acute lower respiratory infection: Secondary | ICD-10-CM | POA: Diagnosis not present

## 2015-06-16 DIAGNOSIS — R32 Unspecified urinary incontinence: Secondary | ICD-10-CM | POA: Diagnosis not present

## 2015-06-16 DIAGNOSIS — J438 Other emphysema: Secondary | ICD-10-CM | POA: Diagnosis not present

## 2015-06-16 DIAGNOSIS — J440 Chronic obstructive pulmonary disease with acute lower respiratory infection: Secondary | ICD-10-CM | POA: Diagnosis not present

## 2015-06-16 DIAGNOSIS — J449 Chronic obstructive pulmonary disease, unspecified: Secondary | ICD-10-CM | POA: Diagnosis not present

## 2015-06-16 DIAGNOSIS — M818 Other osteoporosis without current pathological fracture: Secondary | ICD-10-CM | POA: Diagnosis not present

## 2015-06-17 DIAGNOSIS — J440 Chronic obstructive pulmonary disease with acute lower respiratory infection: Secondary | ICD-10-CM | POA: Diagnosis not present

## 2015-06-18 DIAGNOSIS — J440 Chronic obstructive pulmonary disease with acute lower respiratory infection: Secondary | ICD-10-CM | POA: Diagnosis not present

## 2015-06-19 DIAGNOSIS — J440 Chronic obstructive pulmonary disease with acute lower respiratory infection: Secondary | ICD-10-CM | POA: Diagnosis not present

## 2015-06-20 DIAGNOSIS — J440 Chronic obstructive pulmonary disease with acute lower respiratory infection: Secondary | ICD-10-CM | POA: Diagnosis not present

## 2015-06-21 DIAGNOSIS — J440 Chronic obstructive pulmonary disease with acute lower respiratory infection: Secondary | ICD-10-CM | POA: Diagnosis not present

## 2015-06-22 DIAGNOSIS — J440 Chronic obstructive pulmonary disease with acute lower respiratory infection: Secondary | ICD-10-CM | POA: Diagnosis not present

## 2015-06-23 DIAGNOSIS — J440 Chronic obstructive pulmonary disease with acute lower respiratory infection: Secondary | ICD-10-CM | POA: Diagnosis not present

## 2015-06-24 DIAGNOSIS — J440 Chronic obstructive pulmonary disease with acute lower respiratory infection: Secondary | ICD-10-CM | POA: Diagnosis not present

## 2015-06-25 DIAGNOSIS — J440 Chronic obstructive pulmonary disease with acute lower respiratory infection: Secondary | ICD-10-CM | POA: Diagnosis not present

## 2015-06-26 DIAGNOSIS — J440 Chronic obstructive pulmonary disease with acute lower respiratory infection: Secondary | ICD-10-CM | POA: Diagnosis not present

## 2015-06-27 DIAGNOSIS — M818 Other osteoporosis without current pathological fracture: Secondary | ICD-10-CM | POA: Diagnosis not present

## 2015-06-27 DIAGNOSIS — J440 Chronic obstructive pulmonary disease with acute lower respiratory infection: Secondary | ICD-10-CM | POA: Diagnosis not present

## 2015-06-27 DIAGNOSIS — J438 Other emphysema: Secondary | ICD-10-CM | POA: Diagnosis not present

## 2015-06-27 DIAGNOSIS — J449 Chronic obstructive pulmonary disease, unspecified: Secondary | ICD-10-CM | POA: Diagnosis not present

## 2015-06-27 DIAGNOSIS — R69 Illness, unspecified: Secondary | ICD-10-CM | POA: Diagnosis not present

## 2015-06-28 DIAGNOSIS — J440 Chronic obstructive pulmonary disease with acute lower respiratory infection: Secondary | ICD-10-CM | POA: Diagnosis not present

## 2015-06-29 DIAGNOSIS — J440 Chronic obstructive pulmonary disease with acute lower respiratory infection: Secondary | ICD-10-CM | POA: Diagnosis not present

## 2015-06-30 DIAGNOSIS — R911 Solitary pulmonary nodule: Secondary | ICD-10-CM | POA: Diagnosis not present

## 2015-06-30 DIAGNOSIS — J440 Chronic obstructive pulmonary disease with acute lower respiratory infection: Secondary | ICD-10-CM | POA: Diagnosis not present

## 2015-06-30 DIAGNOSIS — J441 Chronic obstructive pulmonary disease with (acute) exacerbation: Secondary | ICD-10-CM | POA: Diagnosis not present

## 2015-06-30 DIAGNOSIS — R042 Hemoptysis: Secondary | ICD-10-CM | POA: Diagnosis not present

## 2015-06-30 DIAGNOSIS — R0682 Tachypnea, not elsewhere classified: Secondary | ICD-10-CM | POA: Diagnosis not present

## 2015-07-01 DIAGNOSIS — J440 Chronic obstructive pulmonary disease with acute lower respiratory infection: Secondary | ICD-10-CM | POA: Diagnosis not present

## 2015-07-01 DIAGNOSIS — R0602 Shortness of breath: Secondary | ICD-10-CM | POA: Diagnosis not present

## 2015-07-02 DIAGNOSIS — J449 Chronic obstructive pulmonary disease, unspecified: Secondary | ICD-10-CM | POA: Diagnosis not present

## 2015-07-02 DIAGNOSIS — J9602 Acute respiratory failure with hypercapnia: Secondary | ICD-10-CM | POA: Diagnosis not present

## 2015-07-02 DIAGNOSIS — Z6825 Body mass index (BMI) 25.0-25.9, adult: Secondary | ICD-10-CM | POA: Diagnosis not present

## 2015-07-02 DIAGNOSIS — R0602 Shortness of breath: Secondary | ICD-10-CM | POA: Diagnosis not present

## 2015-07-02 DIAGNOSIS — I10 Essential (primary) hypertension: Secondary | ICD-10-CM | POA: Diagnosis not present

## 2015-07-02 DIAGNOSIS — E440 Moderate protein-calorie malnutrition: Secondary | ICD-10-CM | POA: Diagnosis not present

## 2015-07-02 DIAGNOSIS — I471 Supraventricular tachycardia: Secondary | ICD-10-CM | POA: Diagnosis not present

## 2015-07-02 DIAGNOSIS — R042 Hemoptysis: Secondary | ICD-10-CM | POA: Diagnosis not present

## 2015-07-02 DIAGNOSIS — J440 Chronic obstructive pulmonary disease with acute lower respiratory infection: Secondary | ICD-10-CM | POA: Diagnosis not present

## 2015-07-02 DIAGNOSIS — J441 Chronic obstructive pulmonary disease with (acute) exacerbation: Secondary | ICD-10-CM | POA: Diagnosis not present

## 2015-07-02 DIAGNOSIS — E873 Alkalosis: Secondary | ICD-10-CM | POA: Diagnosis not present

## 2015-07-02 DIAGNOSIS — J209 Acute bronchitis, unspecified: Secondary | ICD-10-CM | POA: Diagnosis not present

## 2015-07-02 DIAGNOSIS — I482 Chronic atrial fibrillation: Secondary | ICD-10-CM | POA: Diagnosis not present

## 2015-07-02 DIAGNOSIS — R0682 Tachypnea, not elsewhere classified: Secondary | ICD-10-CM | POA: Diagnosis not present

## 2015-07-04 DIAGNOSIS — J440 Chronic obstructive pulmonary disease with acute lower respiratory infection: Secondary | ICD-10-CM | POA: Diagnosis not present

## 2015-07-08 ENCOUNTER — Other Ambulatory Visit: Payer: Self-pay | Admitting: *Deleted

## 2015-07-08 DIAGNOSIS — M79605 Pain in left leg: Principal | ICD-10-CM

## 2015-07-08 DIAGNOSIS — M79604 Pain in right leg: Secondary | ICD-10-CM

## 2015-07-11 DIAGNOSIS — J440 Chronic obstructive pulmonary disease with acute lower respiratory infection: Secondary | ICD-10-CM | POA: Diagnosis not present

## 2015-07-12 DIAGNOSIS — J9610 Chronic respiratory failure, unspecified whether with hypoxia or hypercapnia: Secondary | ICD-10-CM | POA: Diagnosis not present

## 2015-07-12 DIAGNOSIS — J440 Chronic obstructive pulmonary disease with acute lower respiratory infection: Secondary | ICD-10-CM | POA: Diagnosis not present

## 2015-07-12 DIAGNOSIS — J449 Chronic obstructive pulmonary disease, unspecified: Secondary | ICD-10-CM | POA: Diagnosis not present

## 2015-07-13 DIAGNOSIS — J440 Chronic obstructive pulmonary disease with acute lower respiratory infection: Secondary | ICD-10-CM | POA: Diagnosis not present

## 2015-07-14 DIAGNOSIS — J440 Chronic obstructive pulmonary disease with acute lower respiratory infection: Secondary | ICD-10-CM | POA: Diagnosis not present

## 2015-07-15 DIAGNOSIS — J440 Chronic obstructive pulmonary disease with acute lower respiratory infection: Secondary | ICD-10-CM | POA: Diagnosis not present

## 2015-07-16 DIAGNOSIS — J440 Chronic obstructive pulmonary disease with acute lower respiratory infection: Secondary | ICD-10-CM | POA: Diagnosis not present

## 2015-07-17 DIAGNOSIS — J440 Chronic obstructive pulmonary disease with acute lower respiratory infection: Secondary | ICD-10-CM | POA: Diagnosis not present

## 2015-07-18 DIAGNOSIS — I10 Essential (primary) hypertension: Secondary | ICD-10-CM | POA: Diagnosis not present

## 2015-07-18 DIAGNOSIS — R69 Illness, unspecified: Secondary | ICD-10-CM | POA: Diagnosis not present

## 2015-07-18 DIAGNOSIS — M818 Other osteoporosis without current pathological fracture: Secondary | ICD-10-CM | POA: Diagnosis not present

## 2015-07-18 DIAGNOSIS — J438 Other emphysema: Secondary | ICD-10-CM | POA: Diagnosis not present

## 2015-07-18 DIAGNOSIS — J440 Chronic obstructive pulmonary disease with acute lower respiratory infection: Secondary | ICD-10-CM | POA: Diagnosis not present

## 2015-07-18 DIAGNOSIS — R32 Unspecified urinary incontinence: Secondary | ICD-10-CM | POA: Diagnosis not present

## 2015-07-18 DIAGNOSIS — Z6825 Body mass index (BMI) 25.0-25.9, adult: Secondary | ICD-10-CM | POA: Diagnosis not present

## 2015-07-18 DIAGNOSIS — J449 Chronic obstructive pulmonary disease, unspecified: Secondary | ICD-10-CM | POA: Diagnosis not present

## 2015-07-19 DIAGNOSIS — J440 Chronic obstructive pulmonary disease with acute lower respiratory infection: Secondary | ICD-10-CM | POA: Diagnosis not present

## 2015-07-20 DIAGNOSIS — J440 Chronic obstructive pulmonary disease with acute lower respiratory infection: Secondary | ICD-10-CM | POA: Diagnosis not present

## 2015-07-21 DIAGNOSIS — J440 Chronic obstructive pulmonary disease with acute lower respiratory infection: Secondary | ICD-10-CM | POA: Diagnosis not present

## 2015-07-22 DIAGNOSIS — J440 Chronic obstructive pulmonary disease with acute lower respiratory infection: Secondary | ICD-10-CM | POA: Diagnosis not present

## 2015-07-23 DIAGNOSIS — J440 Chronic obstructive pulmonary disease with acute lower respiratory infection: Secondary | ICD-10-CM | POA: Diagnosis not present

## 2015-07-24 DIAGNOSIS — J440 Chronic obstructive pulmonary disease with acute lower respiratory infection: Secondary | ICD-10-CM | POA: Diagnosis not present

## 2015-07-25 DIAGNOSIS — J440 Chronic obstructive pulmonary disease with acute lower respiratory infection: Secondary | ICD-10-CM | POA: Diagnosis not present

## 2015-07-26 DIAGNOSIS — J440 Chronic obstructive pulmonary disease with acute lower respiratory infection: Secondary | ICD-10-CM | POA: Diagnosis not present

## 2015-07-26 DIAGNOSIS — R69 Illness, unspecified: Secondary | ICD-10-CM | POA: Diagnosis not present

## 2015-07-27 DIAGNOSIS — M818 Other osteoporosis without current pathological fracture: Secondary | ICD-10-CM | POA: Diagnosis not present

## 2015-07-27 DIAGNOSIS — J449 Chronic obstructive pulmonary disease, unspecified: Secondary | ICD-10-CM | POA: Diagnosis not present

## 2015-07-27 DIAGNOSIS — J438 Other emphysema: Secondary | ICD-10-CM | POA: Diagnosis not present

## 2015-07-27 DIAGNOSIS — J440 Chronic obstructive pulmonary disease with acute lower respiratory infection: Secondary | ICD-10-CM | POA: Diagnosis not present

## 2015-07-28 DIAGNOSIS — H524 Presbyopia: Secondary | ICD-10-CM | POA: Diagnosis not present

## 2015-07-28 DIAGNOSIS — H40053 Ocular hypertension, bilateral: Secondary | ICD-10-CM | POA: Diagnosis not present

## 2015-07-28 DIAGNOSIS — H25813 Combined forms of age-related cataract, bilateral: Secondary | ICD-10-CM | POA: Diagnosis not present

## 2015-07-28 DIAGNOSIS — H40013 Open angle with borderline findings, low risk, bilateral: Secondary | ICD-10-CM | POA: Diagnosis not present

## 2015-07-28 DIAGNOSIS — H5202 Hypermetropia, left eye: Secondary | ICD-10-CM | POA: Diagnosis not present

## 2015-07-28 DIAGNOSIS — H35033 Hypertensive retinopathy, bilateral: Secondary | ICD-10-CM | POA: Diagnosis not present

## 2015-07-28 DIAGNOSIS — H2513 Age-related nuclear cataract, bilateral: Secondary | ICD-10-CM | POA: Diagnosis not present

## 2015-07-28 DIAGNOSIS — H52223 Regular astigmatism, bilateral: Secondary | ICD-10-CM | POA: Diagnosis not present

## 2015-07-28 DIAGNOSIS — J440 Chronic obstructive pulmonary disease with acute lower respiratory infection: Secondary | ICD-10-CM | POA: Diagnosis not present

## 2015-07-28 DIAGNOSIS — H25013 Cortical age-related cataract, bilateral: Secondary | ICD-10-CM | POA: Diagnosis not present

## 2015-07-28 DIAGNOSIS — H5211 Myopia, right eye: Secondary | ICD-10-CM | POA: Diagnosis not present

## 2015-07-29 DIAGNOSIS — J440 Chronic obstructive pulmonary disease with acute lower respiratory infection: Secondary | ICD-10-CM | POA: Diagnosis not present

## 2015-07-30 DIAGNOSIS — R0682 Tachypnea, not elsewhere classified: Secondary | ICD-10-CM | POA: Diagnosis not present

## 2015-07-30 DIAGNOSIS — R0602 Shortness of breath: Secondary | ICD-10-CM | POA: Diagnosis not present

## 2015-07-30 DIAGNOSIS — J441 Chronic obstructive pulmonary disease with (acute) exacerbation: Secondary | ICD-10-CM | POA: Diagnosis not present

## 2015-07-30 DIAGNOSIS — I10 Essential (primary) hypertension: Secondary | ICD-10-CM | POA: Diagnosis not present

## 2015-07-30 DIAGNOSIS — I482 Chronic atrial fibrillation: Secondary | ICD-10-CM | POA: Diagnosis not present

## 2015-07-30 DIAGNOSIS — J440 Chronic obstructive pulmonary disease with acute lower respiratory infection: Secondary | ICD-10-CM | POA: Diagnosis not present

## 2015-07-31 DIAGNOSIS — E873 Alkalosis: Secondary | ICD-10-CM | POA: Diagnosis not present

## 2015-07-31 DIAGNOSIS — J441 Chronic obstructive pulmonary disease with (acute) exacerbation: Secondary | ICD-10-CM | POA: Diagnosis not present

## 2015-07-31 DIAGNOSIS — R0602 Shortness of breath: Secondary | ICD-10-CM | POA: Diagnosis not present

## 2015-07-31 DIAGNOSIS — I252 Old myocardial infarction: Secondary | ICD-10-CM | POA: Diagnosis not present

## 2015-07-31 DIAGNOSIS — Z6827 Body mass index (BMI) 27.0-27.9, adult: Secondary | ICD-10-CM | POA: Diagnosis not present

## 2015-07-31 DIAGNOSIS — I10 Essential (primary) hypertension: Secondary | ICD-10-CM | POA: Diagnosis not present

## 2015-07-31 DIAGNOSIS — J9602 Acute respiratory failure with hypercapnia: Secondary | ICD-10-CM | POA: Diagnosis not present

## 2015-07-31 DIAGNOSIS — J440 Chronic obstructive pulmonary disease with acute lower respiratory infection: Secondary | ICD-10-CM | POA: Diagnosis not present

## 2015-07-31 DIAGNOSIS — I160 Hypertensive urgency: Secondary | ICD-10-CM | POA: Diagnosis not present

## 2015-07-31 DIAGNOSIS — E785 Hyperlipidemia, unspecified: Secondary | ICD-10-CM | POA: Diagnosis not present

## 2015-07-31 DIAGNOSIS — K449 Diaphragmatic hernia without obstruction or gangrene: Secondary | ICD-10-CM | POA: Diagnosis not present

## 2015-07-31 DIAGNOSIS — E440 Moderate protein-calorie malnutrition: Secondary | ICD-10-CM | POA: Diagnosis not present

## 2015-08-01 DIAGNOSIS — J440 Chronic obstructive pulmonary disease with acute lower respiratory infection: Secondary | ICD-10-CM | POA: Diagnosis not present

## 2015-08-02 DIAGNOSIS — J440 Chronic obstructive pulmonary disease with acute lower respiratory infection: Secondary | ICD-10-CM | POA: Diagnosis not present

## 2015-08-03 DIAGNOSIS — J440 Chronic obstructive pulmonary disease with acute lower respiratory infection: Secondary | ICD-10-CM | POA: Diagnosis not present

## 2015-08-04 DIAGNOSIS — J440 Chronic obstructive pulmonary disease with acute lower respiratory infection: Secondary | ICD-10-CM | POA: Diagnosis not present

## 2015-08-05 DIAGNOSIS — J440 Chronic obstructive pulmonary disease with acute lower respiratory infection: Secondary | ICD-10-CM | POA: Diagnosis not present

## 2015-08-06 DIAGNOSIS — J440 Chronic obstructive pulmonary disease with acute lower respiratory infection: Secondary | ICD-10-CM | POA: Diagnosis not present

## 2015-08-07 DIAGNOSIS — J440 Chronic obstructive pulmonary disease with acute lower respiratory infection: Secondary | ICD-10-CM | POA: Diagnosis not present

## 2015-08-08 DIAGNOSIS — J440 Chronic obstructive pulmonary disease with acute lower respiratory infection: Secondary | ICD-10-CM | POA: Diagnosis not present

## 2015-08-09 DIAGNOSIS — J440 Chronic obstructive pulmonary disease with acute lower respiratory infection: Secondary | ICD-10-CM | POA: Diagnosis not present

## 2015-08-10 DIAGNOSIS — J440 Chronic obstructive pulmonary disease with acute lower respiratory infection: Secondary | ICD-10-CM | POA: Diagnosis not present

## 2015-08-11 DIAGNOSIS — M545 Low back pain: Secondary | ICD-10-CM | POA: Diagnosis not present

## 2015-08-11 DIAGNOSIS — Z6825 Body mass index (BMI) 25.0-25.9, adult: Secondary | ICD-10-CM | POA: Diagnosis not present

## 2015-08-11 DIAGNOSIS — J440 Chronic obstructive pulmonary disease with acute lower respiratory infection: Secondary | ICD-10-CM | POA: Diagnosis not present

## 2015-08-11 DIAGNOSIS — J449 Chronic obstructive pulmonary disease, unspecified: Secondary | ICD-10-CM | POA: Diagnosis not present

## 2015-08-11 DIAGNOSIS — J9610 Chronic respiratory failure, unspecified whether with hypoxia or hypercapnia: Secondary | ICD-10-CM | POA: Diagnosis not present

## 2015-08-12 DIAGNOSIS — J440 Chronic obstructive pulmonary disease with acute lower respiratory infection: Secondary | ICD-10-CM | POA: Diagnosis not present

## 2015-08-13 DIAGNOSIS — J440 Chronic obstructive pulmonary disease with acute lower respiratory infection: Secondary | ICD-10-CM | POA: Diagnosis not present

## 2015-08-14 DIAGNOSIS — J440 Chronic obstructive pulmonary disease with acute lower respiratory infection: Secondary | ICD-10-CM | POA: Diagnosis not present

## 2015-08-15 DIAGNOSIS — J440 Chronic obstructive pulmonary disease with acute lower respiratory infection: Secondary | ICD-10-CM | POA: Diagnosis not present

## 2015-08-18 DIAGNOSIS — J440 Chronic obstructive pulmonary disease with acute lower respiratory infection: Secondary | ICD-10-CM | POA: Diagnosis not present

## 2015-08-19 DIAGNOSIS — J440 Chronic obstructive pulmonary disease with acute lower respiratory infection: Secondary | ICD-10-CM | POA: Diagnosis not present

## 2015-08-20 DIAGNOSIS — J440 Chronic obstructive pulmonary disease with acute lower respiratory infection: Secondary | ICD-10-CM | POA: Diagnosis not present

## 2015-08-21 DIAGNOSIS — J440 Chronic obstructive pulmonary disease with acute lower respiratory infection: Secondary | ICD-10-CM | POA: Diagnosis not present

## 2015-08-22 DIAGNOSIS — J440 Chronic obstructive pulmonary disease with acute lower respiratory infection: Secondary | ICD-10-CM | POA: Diagnosis not present

## 2015-08-23 DIAGNOSIS — J440 Chronic obstructive pulmonary disease with acute lower respiratory infection: Secondary | ICD-10-CM | POA: Diagnosis not present

## 2015-08-24 DIAGNOSIS — J440 Chronic obstructive pulmonary disease with acute lower respiratory infection: Secondary | ICD-10-CM | POA: Diagnosis not present

## 2015-08-25 DIAGNOSIS — J440 Chronic obstructive pulmonary disease with acute lower respiratory infection: Secondary | ICD-10-CM | POA: Diagnosis not present

## 2015-08-25 DIAGNOSIS — J438 Other emphysema: Secondary | ICD-10-CM | POA: Diagnosis not present

## 2015-08-25 DIAGNOSIS — R32 Unspecified urinary incontinence: Secondary | ICD-10-CM | POA: Diagnosis not present

## 2015-08-25 DIAGNOSIS — J449 Chronic obstructive pulmonary disease, unspecified: Secondary | ICD-10-CM | POA: Diagnosis not present

## 2015-08-25 DIAGNOSIS — M818 Other osteoporosis without current pathological fracture: Secondary | ICD-10-CM | POA: Diagnosis not present

## 2015-08-26 DIAGNOSIS — J440 Chronic obstructive pulmonary disease with acute lower respiratory infection: Secondary | ICD-10-CM | POA: Diagnosis not present

## 2015-08-27 DIAGNOSIS — J440 Chronic obstructive pulmonary disease with acute lower respiratory infection: Secondary | ICD-10-CM | POA: Diagnosis not present

## 2015-08-27 DIAGNOSIS — J449 Chronic obstructive pulmonary disease, unspecified: Secondary | ICD-10-CM | POA: Diagnosis not present

## 2015-08-27 DIAGNOSIS — M818 Other osteoporosis without current pathological fracture: Secondary | ICD-10-CM | POA: Diagnosis not present

## 2015-08-27 DIAGNOSIS — J438 Other emphysema: Secondary | ICD-10-CM | POA: Diagnosis not present

## 2015-08-28 ENCOUNTER — Encounter: Payer: Self-pay | Admitting: Vascular Surgery

## 2015-08-28 DIAGNOSIS — R69 Illness, unspecified: Secondary | ICD-10-CM | POA: Diagnosis not present

## 2015-08-28 DIAGNOSIS — J440 Chronic obstructive pulmonary disease with acute lower respiratory infection: Secondary | ICD-10-CM | POA: Diagnosis not present

## 2015-08-29 DIAGNOSIS — J440 Chronic obstructive pulmonary disease with acute lower respiratory infection: Secondary | ICD-10-CM | POA: Diagnosis not present

## 2015-09-01 DIAGNOSIS — J440 Chronic obstructive pulmonary disease with acute lower respiratory infection: Secondary | ICD-10-CM | POA: Diagnosis not present

## 2015-09-02 DIAGNOSIS — J440 Chronic obstructive pulmonary disease with acute lower respiratory infection: Secondary | ICD-10-CM | POA: Diagnosis not present

## 2015-09-03 DIAGNOSIS — J440 Chronic obstructive pulmonary disease with acute lower respiratory infection: Secondary | ICD-10-CM | POA: Diagnosis not present

## 2015-09-04 ENCOUNTER — Encounter: Payer: Self-pay | Admitting: Vascular Surgery

## 2015-09-04 ENCOUNTER — Ambulatory Visit (HOSPITAL_COMMUNITY)
Admission: RE | Admit: 2015-09-04 | Discharge: 2015-09-04 | Disposition: A | Discharge status: Home / Self Care | Payer: Medicare HMO | Source: Ambulatory Visit | Attending: Vascular Surgery | Admitting: Vascular Surgery

## 2015-09-04 ENCOUNTER — Ambulatory Visit (INDEPENDENT_AMBULATORY_CARE_PROVIDER_SITE_OTHER): Payer: Medicare HMO | Admitting: Vascular Surgery

## 2015-09-04 VITALS — BP 110/80 | HR 93 | Temp 97.5°F | Resp 20 | Ht 60.0 in | Wt 91.0 lb

## 2015-09-04 DIAGNOSIS — R69 Illness, unspecified: Secondary | ICD-10-CM | POA: Diagnosis not present

## 2015-09-04 DIAGNOSIS — M79604 Pain in right leg: Secondary | ICD-10-CM

## 2015-09-04 DIAGNOSIS — K219 Gastro-esophageal reflux disease without esophagitis: Secondary | ICD-10-CM | POA: Insufficient documentation

## 2015-09-04 DIAGNOSIS — M79605 Pain in left leg: Secondary | ICD-10-CM | POA: Diagnosis not present

## 2015-09-04 DIAGNOSIS — J440 Chronic obstructive pulmonary disease with acute lower respiratory infection: Secondary | ICD-10-CM | POA: Diagnosis not present

## 2015-09-04 DIAGNOSIS — I10 Essential (primary) hypertension: Secondary | ICD-10-CM | POA: Insufficient documentation

## 2015-09-04 DIAGNOSIS — F419 Anxiety disorder, unspecified: Secondary | ICD-10-CM | POA: Insufficient documentation

## 2015-09-04 DIAGNOSIS — R0989 Other specified symptoms and signs involving the circulatory and respiratory systems: Secondary | ICD-10-CM | POA: Diagnosis present

## 2015-09-04 DIAGNOSIS — R938 Abnormal findings on diagnostic imaging of other specified body structures: Secondary | ICD-10-CM | POA: Diagnosis not present

## 2015-09-04 NOTE — Progress Notes (Signed)
Referring Physician: Lia Hopping MD Patient name: Sheila Pierce MRN: [id number] DOB: [birth year] Sex: female  REASON FOR CONSULT: leg pain  HPI: Sheila Pierce is a 70 y.o. female, who complains of bilateral calf pain just prior to going to bed in the evenings. She states she does not ambulate enough to elicit any calf pain otherwise due to her severe COPD. She is on continuous home oxygen. She is short of breath even at rest. She denies rest pain in her feet. She has no nonhealing wounds. Her son states also that she probably has lung cancer but this has not been confirmed with tissue diagnosis due to high risk of the procedure. She does take aspirin. Other medical problems include arthritis, asthma, COPD, coronary artery disease, hypertension which are currently stable.  Past Medical History:  Diagnosis Date  . Anxiety   . Arthritis   . Asthma   . COPD (chronic obstructive pulmonary disease) (HCC)    Chronic resp failure on home O2  . GERD (gastroesophageal reflux disease)   . Hyperglycemia    Noted 09/2011 admission in setting of steroid use with normal HgbA1C.  Marland Kitchen Hypertension   . Hypokalemia   . Hyponatremia    Noted 09/2011 admission.  . NSTEMI (non-ST elevated myocardial infarction) (HCC)    a. 09/2011 in setting of acute on chronic resp failure/COPD exacerbation --> cath demonstrated nonobstructive CAD 10/05/11 with EF 50%;  08/2012 elevated Ti in setting of COPD flare -->Echo: EF 60-65%, Gr 1 DD -->Med Rx.  . QT prolongation    Noted on EKG 09/2011 (590 in setting of K of 3, improved to 475 by discharge)   Past Surgical History:  Procedure Laterality Date  . ABDOMINAL HYSTERECTOMY    . CHOLECYSTECTOMY    . LEFT HEART CATHETERIZATION WITH CORONARY ANGIOGRAM N/A 10/05/2011   Procedure: LEFT HEART CATHETERIZATION WITH CORONARY ANGIOGRAM;  Surgeon: Tonny Bollman, MD;  Location: [facility] CATH LAB;  Service: Cardiovascular;  Laterality: N/A;  . PLANTAR'S WART EXCISION      Family  History  Problem Relation Age of Onset  . Cancer Father     Lung  . Cancer Mother     Liver    SOCIAL HISTORY: Social History   Social History  . Marital status: Unknown    Spouse name: N/A  . Number of children: N/A  . Years of education: N/A   Occupational History  . Not on file.   Social History Main Topics  . Smoking status: Former Smoker    Packs/day: 0.50    Years: 40.00    Types: Cigarettes    Start date: 01/26/1964    Quit date: 10/2014  . Smokeless tobacco: Never Used     Comment: 1 pack every 3 days  . Alcohol use No  . Drug use: No  . Sexual activity: Not on file   Other Topics Concern  . Not on file   Social History Narrative   Her son and her adult grandson live with her.     Allergies  Allergen Reactions  . Penicillins Swelling  . Codeine Nausea Only  . Hydrocodone Nausea Only  . Lorcet [Hydrocodone-Acetaminophen] Other (See Comments)       . Sulfa Antibiotics     Hallucinations     Current Outpatient Prescriptions  Medication Sig Dispense Refill  . acetaZOLAMIDE (DIAMOX) 250 MG tablet Take 250 mg by mouth 3 (three) times daily.    Marland Kitchen albuterol (PROVENTIL HFA;VENTOLIN HFA) 108 (90  BASE) MCG/ACT inhaler Inhale 2 puffs into the lungs every 4 (four) hours as needed. For shortness of breath.    Marland Kitchen albuterol (PROVENTIL HFA;VENTOLIN HFA) 108 (90 Base) MCG/ACT inhaler Inhale into the lungs every 6 (six) hours as needed for wheezing or shortness of breath.    Marland Kitchen aspirin 81 MG tablet Take 81 mg by mouth daily.    . busPIRone (BUSPAR) 15 MG tablet Take 15 mg by mouth 3 (three) times daily.    . cholecalciferol (VITAMIN D) 400 units TABS tablet Take 400 Units by mouth.    . hydrochlorothiazide (HYDRODIURIL) 25 MG tablet Take 25 mg by mouth daily.    Marland Kitchen lisinopril (PRINIVIL,ZESTRIL) 20 MG tablet Take 20 mg by mouth daily.    . metoprolol tartrate (LOPRESSOR) 25 MG tablet Take 25 mg by mouth 2 (two) times daily.    . potassium chloride (K-DUR) 10 MEQ tablet  Take 10 mEq by mouth daily.    . Tiotropium Bromide Monohydrate (SPIRIVA HANDIHALER IN) Inhale into the lungs.    . traMADol (ULTRAM) 50 MG tablet Take 50 mg by mouth every 6 (six) hours as needed.    . budesonide-formoterol (SYMBICORT) 160-4.5 MCG/ACT inhaler Inhale 2 puffs into the lungs daily.    . nitroGLYCERIN (NITROSTAT) 0.4 MG SL tablet Place 0.4 mg under the tongue every 5 (five) minutes as needed for chest pain.    Marland Kitchen quinapril (ACCUPRIL) 40 MG tablet Take 1 tablet (40 mg total) by mouth daily. (Patient not taking: Reported on 09/04/2015) 30 tablet 6   No current facility-administered medications for this visit.     ROS:   General:  No weight loss, Fever, chills  HEENT: No recent headaches, no nasal bleeding, no visual changes, no sore throat  Neurologic: No dizziness, blackouts, seizures. No recent symptoms of stroke or mini- stroke. No recent episodes of slurred speech, or temporary blindness.  Cardiac: No recent episodes of chest pain/pressure, + shortness of breath at rest.  + shortness of breath with exertion.  Denies history of atrial fibrillation or irregular heartbeat  Vascular: + history of rest pain in feet.  + history of claudication.  + history of non-healing ulcer, No history of DVT   Pulmonary: + home oxygen, no productive cough, no hemoptysis,  + asthma or wheezing  Musculoskeletal:  [ x] Arthritis, [ ]  Low back pain,  [ ]  Joint pain  Hematologic:No history of hypercoagulable state.  No history of easy bleeding.  No history of anemia  Gastrointestinal: No hematochezia or melena,  No gastroesophageal reflux, no trouble swallowing  Urinary: [ ]  chronic Kidney disease, [ ]  on HD - [ ]  MWF or [ ]  TTHS, [ ]  Burning with urination, [ ]  Frequent urination, [ ]  Difficulty urinating;   Skin: No rashes  Psychological: + history of anxiety,  No history of depression   Physical Examination  Vitals:   09/04/15 1042  BP: 110/80  Pulse: 93  Resp: 20  Temp: 97.5 F  (36.4 C)  TempSrc: Oral  SpO2: 99%  Weight: 91 lb (41.3 kg)  Height: 5' (1.524 m)    Body mass index is 17.77 kg/m.  General:  Alert and oriented, no acute distress HEENT: Normal Neck: No bruit or JVD Pulmonary: Clear to auscultation bilaterally distant breath sounds Cardiac: Regular Rate and Rhythm without murmur Abdomen: Soft, non-tender, non-distended, no mass, no scars Skin: No rash Extremity Pulses:  2+ radial, brachial, femoral, posterior tibial pulses bilaterally Musculoskeletal: No deformity or edema  Neurologic:  Upper and lower extremity motor 5/5 and symmetric  DATA:  Patient had bilateral ABIs performed today which were 0.99 on the right 1.04 on the left triphasic and normal bilaterally  ASSESSMENT:  Patient with bilateral leg pain. This does not seem to be related to a vascular etiology as she has normal ABIs with palpable posterior tibial pulses. She is severely debilitated from her underlying lung dysfunction.   PLAN:  Patient will follow-up on as-needed basis.  Fabienne Bruns, MD [facility] Office: [phone] Pager: [phone]

## 2015-09-05 DIAGNOSIS — R079 Chest pain, unspecified: Secondary | ICD-10-CM | POA: Diagnosis not present

## 2015-09-05 DIAGNOSIS — I10 Essential (primary) hypertension: Secondary | ICD-10-CM | POA: Diagnosis not present

## 2015-09-05 DIAGNOSIS — J441 Chronic obstructive pulmonary disease with (acute) exacerbation: Secondary | ICD-10-CM | POA: Diagnosis not present

## 2015-09-05 DIAGNOSIS — J440 Chronic obstructive pulmonary disease with acute lower respiratory infection: Secondary | ICD-10-CM | POA: Diagnosis not present

## 2015-09-05 DIAGNOSIS — R0682 Tachypnea, not elsewhere classified: Secondary | ICD-10-CM | POA: Diagnosis not present

## 2015-09-05 DIAGNOSIS — R9431 Abnormal electrocardiogram [ECG] [EKG]: Secondary | ICD-10-CM | POA: Diagnosis not present

## 2015-09-05 DIAGNOSIS — R0902 Hypoxemia: Secondary | ICD-10-CM | POA: Diagnosis not present

## 2015-09-05 DIAGNOSIS — R0602 Shortness of breath: Secondary | ICD-10-CM | POA: Diagnosis not present

## 2015-09-06 DIAGNOSIS — E440 Moderate protein-calorie malnutrition: Secondary | ICD-10-CM | POA: Diagnosis not present

## 2015-09-06 DIAGNOSIS — J441 Chronic obstructive pulmonary disease with (acute) exacerbation: Secondary | ICD-10-CM | POA: Diagnosis not present

## 2015-09-06 DIAGNOSIS — G8929 Other chronic pain: Secondary | ICD-10-CM | POA: Diagnosis not present

## 2015-09-06 DIAGNOSIS — I10 Essential (primary) hypertension: Secondary | ICD-10-CM | POA: Diagnosis not present

## 2015-09-06 DIAGNOSIS — R9431 Abnormal electrocardiogram [ECG] [EKG]: Secondary | ICD-10-CM | POA: Diagnosis not present

## 2015-09-06 DIAGNOSIS — E872 Acidosis: Secondary | ICD-10-CM | POA: Diagnosis not present

## 2015-09-06 DIAGNOSIS — R079 Chest pain, unspecified: Secondary | ICD-10-CM | POA: Diagnosis not present

## 2015-09-06 DIAGNOSIS — R0602 Shortness of breath: Secondary | ICD-10-CM | POA: Diagnosis not present

## 2015-09-06 DIAGNOSIS — K449 Diaphragmatic hernia without obstruction or gangrene: Secondary | ICD-10-CM | POA: Diagnosis not present

## 2015-09-06 DIAGNOSIS — Z6827 Body mass index (BMI) 27.0-27.9, adult: Secondary | ICD-10-CM | POA: Diagnosis not present

## 2015-09-06 DIAGNOSIS — J440 Chronic obstructive pulmonary disease with acute lower respiratory infection: Secondary | ICD-10-CM | POA: Diagnosis not present

## 2015-09-06 DIAGNOSIS — R0902 Hypoxemia: Secondary | ICD-10-CM | POA: Diagnosis not present

## 2015-09-06 DIAGNOSIS — M545 Low back pain: Secondary | ICD-10-CM | POA: Diagnosis not present

## 2015-09-06 DIAGNOSIS — J9602 Acute respiratory failure with hypercapnia: Secondary | ICD-10-CM | POA: Diagnosis not present

## 2015-09-06 DIAGNOSIS — E785 Hyperlipidemia, unspecified: Secondary | ICD-10-CM | POA: Diagnosis not present

## 2015-09-07 DIAGNOSIS — I10 Essential (primary) hypertension: Secondary | ICD-10-CM | POA: Diagnosis not present

## 2015-09-07 DIAGNOSIS — E872 Acidosis: Secondary | ICD-10-CM | POA: Diagnosis not present

## 2015-09-07 DIAGNOSIS — J441 Chronic obstructive pulmonary disease with (acute) exacerbation: Secondary | ICD-10-CM | POA: Diagnosis not present

## 2015-09-07 DIAGNOSIS — J9602 Acute respiratory failure with hypercapnia: Secondary | ICD-10-CM | POA: Diagnosis not present

## 2015-09-07 DIAGNOSIS — E440 Moderate protein-calorie malnutrition: Secondary | ICD-10-CM | POA: Diagnosis not present

## 2015-09-08 DIAGNOSIS — J9602 Acute respiratory failure with hypercapnia: Secondary | ICD-10-CM | POA: Diagnosis not present

## 2015-09-08 DIAGNOSIS — J440 Chronic obstructive pulmonary disease with acute lower respiratory infection: Secondary | ICD-10-CM | POA: Diagnosis not present

## 2015-09-08 DIAGNOSIS — I10 Essential (primary) hypertension: Secondary | ICD-10-CM | POA: Diagnosis not present

## 2015-09-08 DIAGNOSIS — J441 Chronic obstructive pulmonary disease with (acute) exacerbation: Secondary | ICD-10-CM | POA: Diagnosis not present

## 2015-09-08 DIAGNOSIS — E872 Acidosis: Secondary | ICD-10-CM | POA: Diagnosis not present

## 2015-09-08 DIAGNOSIS — E440 Moderate protein-calorie malnutrition: Secondary | ICD-10-CM | POA: Diagnosis not present

## 2015-09-09 DIAGNOSIS — E440 Moderate protein-calorie malnutrition: Secondary | ICD-10-CM | POA: Diagnosis not present

## 2015-09-09 DIAGNOSIS — J441 Chronic obstructive pulmonary disease with (acute) exacerbation: Secondary | ICD-10-CM | POA: Diagnosis not present

## 2015-09-09 DIAGNOSIS — I10 Essential (primary) hypertension: Secondary | ICD-10-CM | POA: Diagnosis not present

## 2015-09-09 DIAGNOSIS — E872 Acidosis: Secondary | ICD-10-CM | POA: Diagnosis not present

## 2015-09-09 DIAGNOSIS — J440 Chronic obstructive pulmonary disease with acute lower respiratory infection: Secondary | ICD-10-CM | POA: Diagnosis not present

## 2015-09-09 DIAGNOSIS — J9602 Acute respiratory failure with hypercapnia: Secondary | ICD-10-CM | POA: Diagnosis not present

## 2015-09-10 DIAGNOSIS — J440 Chronic obstructive pulmonary disease with acute lower respiratory infection: Secondary | ICD-10-CM | POA: Diagnosis not present

## 2015-09-11 DIAGNOSIS — J440 Chronic obstructive pulmonary disease with acute lower respiratory infection: Secondary | ICD-10-CM | POA: Diagnosis not present

## 2015-09-11 DIAGNOSIS — J449 Chronic obstructive pulmonary disease, unspecified: Secondary | ICD-10-CM | POA: Diagnosis not present

## 2015-09-11 DIAGNOSIS — J9610 Chronic respiratory failure, unspecified whether with hypoxia or hypercapnia: Secondary | ICD-10-CM | POA: Diagnosis not present

## 2015-09-16 DIAGNOSIS — M818 Other osteoporosis without current pathological fracture: Secondary | ICD-10-CM | POA: Diagnosis not present

## 2015-09-16 DIAGNOSIS — M545 Low back pain: Secondary | ICD-10-CM | POA: Diagnosis not present

## 2015-09-16 DIAGNOSIS — I240 Acute coronary thrombosis not resulting in myocardial infarction: Secondary | ICD-10-CM | POA: Diagnosis not present

## 2015-09-16 DIAGNOSIS — Z6825 Body mass index (BMI) 25.0-25.9, adult: Secondary | ICD-10-CM | POA: Diagnosis not present

## 2015-09-16 DIAGNOSIS — J440 Chronic obstructive pulmonary disease with acute lower respiratory infection: Secondary | ICD-10-CM | POA: Diagnosis not present

## 2015-09-18 DIAGNOSIS — J440 Chronic obstructive pulmonary disease with acute lower respiratory infection: Secondary | ICD-10-CM | POA: Diagnosis not present

## 2015-09-19 DIAGNOSIS — J440 Chronic obstructive pulmonary disease with acute lower respiratory infection: Secondary | ICD-10-CM | POA: Diagnosis not present

## 2015-09-20 DIAGNOSIS — J440 Chronic obstructive pulmonary disease with acute lower respiratory infection: Secondary | ICD-10-CM | POA: Diagnosis not present

## 2015-09-21 DIAGNOSIS — J440 Chronic obstructive pulmonary disease with acute lower respiratory infection: Secondary | ICD-10-CM | POA: Diagnosis not present

## 2015-09-22 DIAGNOSIS — J440 Chronic obstructive pulmonary disease with acute lower respiratory infection: Secondary | ICD-10-CM | POA: Diagnosis not present

## 2015-09-22 DIAGNOSIS — H353132 Nonexudative age-related macular degeneration, bilateral, intermediate dry stage: Secondary | ICD-10-CM | POA: Diagnosis not present

## 2015-09-22 DIAGNOSIS — H2513 Age-related nuclear cataract, bilateral: Secondary | ICD-10-CM | POA: Diagnosis not present

## 2015-09-24 DIAGNOSIS — E440 Moderate protein-calorie malnutrition: Secondary | ICD-10-CM | POA: Diagnosis not present

## 2015-09-24 DIAGNOSIS — R0602 Shortness of breath: Secondary | ICD-10-CM | POA: Diagnosis not present

## 2015-09-24 DIAGNOSIS — R918 Other nonspecific abnormal finding of lung field: Secondary | ICD-10-CM | POA: Diagnosis not present

## 2015-09-24 DIAGNOSIS — M545 Low back pain: Secondary | ICD-10-CM | POA: Diagnosis not present

## 2015-09-24 DIAGNOSIS — B9620 Unspecified Escherichia coli [E. coli] as the cause of diseases classified elsewhere: Secondary | ICD-10-CM | POA: Diagnosis not present

## 2015-09-24 DIAGNOSIS — E785 Hyperlipidemia, unspecified: Secondary | ICD-10-CM | POA: Diagnosis not present

## 2015-09-24 DIAGNOSIS — E873 Alkalosis: Secondary | ICD-10-CM | POA: Diagnosis not present

## 2015-09-24 DIAGNOSIS — I10 Essential (primary) hypertension: Secondary | ICD-10-CM | POA: Diagnosis not present

## 2015-09-24 DIAGNOSIS — B9629 Other Escherichia coli [E. coli] as the cause of diseases classified elsewhere: Secondary | ICD-10-CM | POA: Diagnosis not present

## 2015-09-24 DIAGNOSIS — N289 Disorder of kidney and ureter, unspecified: Secondary | ICD-10-CM | POA: Diagnosis not present

## 2015-09-24 DIAGNOSIS — N390 Urinary tract infection, site not specified: Secondary | ICD-10-CM | POA: Diagnosis not present

## 2015-09-24 DIAGNOSIS — J9602 Acute respiratory failure with hypercapnia: Secondary | ICD-10-CM | POA: Diagnosis not present

## 2015-09-24 DIAGNOSIS — Z6825 Body mass index (BMI) 25.0-25.9, adult: Secondary | ICD-10-CM | POA: Diagnosis not present

## 2015-09-24 DIAGNOSIS — K449 Diaphragmatic hernia without obstruction or gangrene: Secondary | ICD-10-CM | POA: Diagnosis not present

## 2015-09-25 DIAGNOSIS — J9602 Acute respiratory failure with hypercapnia: Secondary | ICD-10-CM | POA: Diagnosis not present

## 2015-09-25 DIAGNOSIS — N390 Urinary tract infection, site not specified: Secondary | ICD-10-CM | POA: Diagnosis not present

## 2015-09-25 DIAGNOSIS — I10 Essential (primary) hypertension: Secondary | ICD-10-CM | POA: Diagnosis not present

## 2015-09-25 DIAGNOSIS — B9629 Other Escherichia coli [E. coli] as the cause of diseases classified elsewhere: Secondary | ICD-10-CM | POA: Diagnosis not present

## 2015-09-25 DIAGNOSIS — E873 Alkalosis: Secondary | ICD-10-CM | POA: Diagnosis not present

## 2015-09-26 DIAGNOSIS — N390 Urinary tract infection, site not specified: Secondary | ICD-10-CM | POA: Diagnosis not present

## 2015-09-26 DIAGNOSIS — M818 Other osteoporosis without current pathological fracture: Secondary | ICD-10-CM | POA: Diagnosis not present

## 2015-09-26 DIAGNOSIS — J449 Chronic obstructive pulmonary disease, unspecified: Secondary | ICD-10-CM | POA: Diagnosis not present

## 2015-09-26 DIAGNOSIS — B9629 Other Escherichia coli [E. coli] as the cause of diseases classified elsewhere: Secondary | ICD-10-CM | POA: Diagnosis not present

## 2015-09-26 DIAGNOSIS — E873 Alkalosis: Secondary | ICD-10-CM | POA: Diagnosis not present

## 2015-09-26 DIAGNOSIS — R32 Unspecified urinary incontinence: Secondary | ICD-10-CM | POA: Diagnosis not present

## 2015-09-26 DIAGNOSIS — I10 Essential (primary) hypertension: Secondary | ICD-10-CM | POA: Diagnosis not present

## 2015-09-26 DIAGNOSIS — J9602 Acute respiratory failure with hypercapnia: Secondary | ICD-10-CM | POA: Diagnosis not present

## 2015-09-26 DIAGNOSIS — J438 Other emphysema: Secondary | ICD-10-CM | POA: Diagnosis not present

## 2015-09-27 DIAGNOSIS — M818 Other osteoporosis without current pathological fracture: Secondary | ICD-10-CM | POA: Diagnosis not present

## 2015-09-27 DIAGNOSIS — B9629 Other Escherichia coli [E. coli] as the cause of diseases classified elsewhere: Secondary | ICD-10-CM | POA: Diagnosis not present

## 2015-09-27 DIAGNOSIS — E873 Alkalosis: Secondary | ICD-10-CM | POA: Diagnosis not present

## 2015-09-27 DIAGNOSIS — N390 Urinary tract infection, site not specified: Secondary | ICD-10-CM | POA: Diagnosis not present

## 2015-09-27 DIAGNOSIS — I10 Essential (primary) hypertension: Secondary | ICD-10-CM | POA: Diagnosis not present

## 2015-09-27 DIAGNOSIS — J9602 Acute respiratory failure with hypercapnia: Secondary | ICD-10-CM | POA: Diagnosis not present

## 2015-09-27 DIAGNOSIS — J449 Chronic obstructive pulmonary disease, unspecified: Secondary | ICD-10-CM | POA: Diagnosis not present

## 2015-09-27 DIAGNOSIS — J438 Other emphysema: Secondary | ICD-10-CM | POA: Diagnosis not present

## 2015-09-28 DIAGNOSIS — J440 Chronic obstructive pulmonary disease with acute lower respiratory infection: Secondary | ICD-10-CM | POA: Diagnosis not present

## 2015-09-29 DIAGNOSIS — R69 Illness, unspecified: Secondary | ICD-10-CM | POA: Diagnosis not present

## 2015-09-29 DIAGNOSIS — J440 Chronic obstructive pulmonary disease with acute lower respiratory infection: Secondary | ICD-10-CM | POA: Diagnosis not present

## 2015-09-30 DIAGNOSIS — J440 Chronic obstructive pulmonary disease with acute lower respiratory infection: Secondary | ICD-10-CM | POA: Diagnosis not present

## 2015-10-01 DIAGNOSIS — J440 Chronic obstructive pulmonary disease with acute lower respiratory infection: Secondary | ICD-10-CM | POA: Diagnosis not present

## 2015-10-02 DIAGNOSIS — J440 Chronic obstructive pulmonary disease with acute lower respiratory infection: Secondary | ICD-10-CM | POA: Diagnosis not present

## 2015-10-03 DIAGNOSIS — J440 Chronic obstructive pulmonary disease with acute lower respiratory infection: Secondary | ICD-10-CM | POA: Diagnosis not present

## 2015-10-04 DIAGNOSIS — J440 Chronic obstructive pulmonary disease with acute lower respiratory infection: Secondary | ICD-10-CM | POA: Diagnosis not present

## 2015-10-05 DIAGNOSIS — J440 Chronic obstructive pulmonary disease with acute lower respiratory infection: Secondary | ICD-10-CM | POA: Diagnosis not present

## 2015-10-06 DIAGNOSIS — Z9049 Acquired absence of other specified parts of digestive tract: Secondary | ICD-10-CM | POA: Diagnosis not present

## 2015-10-06 DIAGNOSIS — R1084 Generalized abdominal pain: Secondary | ICD-10-CM | POA: Diagnosis not present

## 2015-10-06 DIAGNOSIS — R109 Unspecified abdominal pain: Secondary | ICD-10-CM | POA: Diagnosis not present

## 2015-10-06 DIAGNOSIS — J440 Chronic obstructive pulmonary disease with acute lower respiratory infection: Secondary | ICD-10-CM | POA: Diagnosis not present

## 2015-10-07 DIAGNOSIS — Z6825 Body mass index (BMI) 25.0-25.9, adult: Secondary | ICD-10-CM | POA: Diagnosis not present

## 2015-10-07 DIAGNOSIS — J440 Chronic obstructive pulmonary disease with acute lower respiratory infection: Secondary | ICD-10-CM | POA: Diagnosis not present

## 2015-10-07 DIAGNOSIS — M545 Low back pain: Secondary | ICD-10-CM | POA: Diagnosis not present

## 2015-10-09 DIAGNOSIS — J440 Chronic obstructive pulmonary disease with acute lower respiratory infection: Secondary | ICD-10-CM | POA: Diagnosis not present

## 2015-10-10 DIAGNOSIS — R0602 Shortness of breath: Secondary | ICD-10-CM | POA: Diagnosis not present

## 2015-10-10 DIAGNOSIS — J440 Chronic obstructive pulmonary disease with acute lower respiratory infection: Secondary | ICD-10-CM | POA: Diagnosis not present

## 2015-10-10 DIAGNOSIS — J441 Chronic obstructive pulmonary disease with (acute) exacerbation: Secondary | ICD-10-CM | POA: Diagnosis not present

## 2015-10-10 DIAGNOSIS — Z9981 Dependence on supplemental oxygen: Secondary | ICD-10-CM | POA: Diagnosis not present

## 2015-10-10 DIAGNOSIS — R062 Wheezing: Secondary | ICD-10-CM | POA: Diagnosis not present

## 2015-10-10 DIAGNOSIS — R0789 Other chest pain: Secondary | ICD-10-CM | POA: Diagnosis not present

## 2015-10-11 DIAGNOSIS — J440 Chronic obstructive pulmonary disease with acute lower respiratory infection: Secondary | ICD-10-CM | POA: Diagnosis not present

## 2015-10-12 DIAGNOSIS — J9610 Chronic respiratory failure, unspecified whether with hypoxia or hypercapnia: Secondary | ICD-10-CM | POA: Diagnosis not present

## 2015-10-12 DIAGNOSIS — J449 Chronic obstructive pulmonary disease, unspecified: Secondary | ICD-10-CM | POA: Diagnosis not present

## 2015-10-12 DIAGNOSIS — J440 Chronic obstructive pulmonary disease with acute lower respiratory infection: Secondary | ICD-10-CM | POA: Diagnosis not present

## 2015-10-13 DIAGNOSIS — J440 Chronic obstructive pulmonary disease with acute lower respiratory infection: Secondary | ICD-10-CM | POA: Diagnosis not present

## 2015-10-14 DIAGNOSIS — J440 Chronic obstructive pulmonary disease with acute lower respiratory infection: Secondary | ICD-10-CM | POA: Diagnosis not present

## 2015-10-15 DIAGNOSIS — J440 Chronic obstructive pulmonary disease with acute lower respiratory infection: Secondary | ICD-10-CM | POA: Diagnosis not present

## 2015-10-15 NOTE — Patient Instructions (Signed)
Sheila Pierce  10/15/2015     @PREFPERIOPPHARMACY @   Your procedure is scheduled on 10/23/2015.  [facility]  Call this number if you have problems the morning of surgery:  [phone]   Remember:  Do not eat food or drink liquids after midnight.  Take these medicines the morning of surgery with A SIP OF WATER : Tramadol, Diamox, Buspar, Hydroduril, Lisinopril, Lopressor and K Dur.  Please use your inhalers prior to coming to the hospital and bring it with you to the hospital.   Do not wear jewelry, make-up or nail polish.  Do not wear lotions, powders, or perfumes, or deoderant.  Do not shave 48 hours prior to surgery.  Men may shave face and neck.  Do not bring valuables to the hospital.  [facility] is not [facility] for any belongings or valuables.  Contacts, dentures or bridgework may not be worn into surgery.  Leave your suitcase in the car.  After surgery it may be brought to your room.  For patients admitted to the hospital, discharge time will be determined by your treatment team.  Patients discharged the day of surgery will not be allowed to drive home.   Name and phone number of your driver:   family Special instructions:  n/a  Please read over the following fact sheets that you were given. Care and Recovery After Surgery      A cataract is a clouding of the lens of the eye. When a lens becomes cloudy, vision is reduced based on the degree and nature of the clouding. Surgery may be needed to improve vision. Surgery removes the cloudy lens and usually replaces it with a substitute lens (intraocular lens, IOL). LET YOUR EYE DOCTOR KNOW ABOUT:  Allergies to food or medicine.  Medicines taken including herbs, eye drops, over-the-counter medicines, and creams.  Use of steroids (by mouth or creams).  Previous problems with anesthetics or numbing medicine.  History of bleeding problems or blood clots.  Previous surgery.  Other health  problems, including diabetes and kidney problems.  Possibility of pregnancy, if this applies. RISKS AND COMPLICATIONS  Infection.  Inflammation of the eyeball (endophthalmitis) that can spread to both eyes (sympathetic ophthalmia).  Poor wound healing.  If an IOL is inserted, it can later fall out of proper position. This is very uncommon.  Clouding of the part of your eye that holds an IOL in place. This is called an "after-cataract." These are uncommon but easily treated. BEFORE THE PROCEDURE  Do not eat or drink anything except small amounts of water for 8 to 12 before your surgery, or as directed by your caregiver.  Unless you are told otherwise, continue any eye drops you have been prescribed.  Talk to your primary caregiver about all other medicines that you take (both prescription and nonprescription). In some cases, you may need to stop or change medicines near the time of your surgery. This is most important if you are taking blood-thinning medicine.Do not stop medicines unless you are told to do so.  Arrange for someone to drive you to and from the procedure.  Do not put contact lenses in either eye on the day of your surgery. PROCEDURE There is more than one method for safely removing a cataract. Your doctor can explain the differences and help determine which is best for you. Phacoemulsification surgery is the most common form of cataract surgery.  An injection is given behind the eye or  eye drops are given to make this a painless procedure.  A small cut (incision) is made on the edge of the clear, dome-shaped surface that covers the front of the eye (cornea).  A tiny probe is painlessly inserted into the eye. This device gives off ultrasound waves that soften and break up the cloudy center of the lens. This makes it easier for the cloudy lens to be removed by suction.  An IOL may be implanted.  The normal lens of the eye is covered by a clear capsule. Part of that  capsule is intentionally left in the eye to support the IOL.  Your surgeon may or may not use stitches to close the incision. There are other forms of cataract surgery that require a larger incision and stitches to close the eye. This approach is taken in cases where the doctor feels that the cataract cannot be easily removed using phacoemulsification. AFTER THE PROCEDURE  When an IOL is implanted, it does not need care. It becomes a permanent part of your eye and cannot be seen or felt.  Your doctor will schedule follow-up exams to check on your progress.  Review your other medicines with your doctor to see which can be resumed after surgery.  Use eye drops or take medicine as prescribed by your doctor.   This information is not intended to replace advice given to you by your health care provider. Make sure you discuss any questions you have with your health care provider.   Document Released: 12/31/2010 Document Revised: 02/01/2014 Document Reviewed: 12/31/2010 Elsevier Interactive Patient Education [employer]

## 2015-10-16 ENCOUNTER — Encounter (HOSPITAL_COMMUNITY)
Admission: RE | Admit: 2015-10-16 | Discharge: 2015-10-16 | Disposition: A | Discharge status: Home / Self Care | Payer: Medicare HMO | Source: Ambulatory Visit | Attending: Ophthalmology | Admitting: Ophthalmology

## 2015-10-16 ENCOUNTER — Other Ambulatory Visit: Payer: Self-pay

## 2015-10-16 ENCOUNTER — Encounter (HOSPITAL_COMMUNITY): Payer: Self-pay

## 2015-10-16 DIAGNOSIS — J440 Chronic obstructive pulmonary disease with acute lower respiratory infection: Secondary | ICD-10-CM | POA: Diagnosis not present

## 2015-10-16 DIAGNOSIS — I10 Essential (primary) hypertension: Secondary | ICD-10-CM | POA: Insufficient documentation

## 2015-10-16 LAB — CBC
HCT: 31.6 % — ABNORMAL LOW (ref 36.0–46.0)
HEMOGLOBIN: 10.1 g/dL — AB (ref 12.0–15.0)
MCH: 29.5 pg (ref 26.0–34.0)
MCHC: 32 g/dL (ref 30.0–36.0)
MCV: 92.4 fL (ref 78.0–100.0)
Platelets: 320 10*3/uL (ref 150–400)
RBC: 3.42 MIL/uL — ABNORMAL LOW (ref 3.87–5.11)
RDW: 14.4 % (ref 11.5–15.5)
WBC: 11.7 10*3/uL — ABNORMAL HIGH (ref 4.0–10.5)

## 2015-10-16 LAB — BASIC METABOLIC PANEL
Anion gap: 2 — ABNORMAL LOW (ref 5–15)
BUN: 22 mg/dL — AB (ref 6–20)
CHLORIDE: 100 mmol/L — AB (ref 101–111)
CO2: 31 mmol/L (ref 22–32)
CREATININE: 1.04 mg/dL — AB (ref 0.44–1.00)
Calcium: 9.2 mg/dL (ref 8.9–10.3)
GFR calc Af Amer: >60 mL/min (ref >60)
GFR calc non Af Amer: 53 mL/min — ABNORMAL LOW (ref >60)
GLUCOSE: 113 mg/dL — AB (ref 65–99)
Potassium: 3.5 mmol/L (ref 3.5–5.1)
SODIUM: 133 mmol/L — AB (ref 135–145)

## 2015-10-17 DIAGNOSIS — J440 Chronic obstructive pulmonary disease with acute lower respiratory infection: Secondary | ICD-10-CM | POA: Diagnosis not present

## 2015-10-18 DIAGNOSIS — J440 Chronic obstructive pulmonary disease with acute lower respiratory infection: Secondary | ICD-10-CM | POA: Diagnosis not present

## 2015-10-19 DIAGNOSIS — J440 Chronic obstructive pulmonary disease with acute lower respiratory infection: Secondary | ICD-10-CM | POA: Diagnosis not present

## 2015-10-20 DIAGNOSIS — J9690 Respiratory failure, unspecified, unspecified whether with hypoxia or hypercapnia: Secondary | ICD-10-CM | POA: Diagnosis not present

## 2015-10-20 DIAGNOSIS — J441 Chronic obstructive pulmonary disease with (acute) exacerbation: Secondary | ICD-10-CM | POA: Diagnosis not present

## 2015-10-20 DIAGNOSIS — J189 Pneumonia, unspecified organism: Secondary | ICD-10-CM | POA: Diagnosis not present

## 2015-10-20 DIAGNOSIS — E871 Hypo-osmolality and hyponatremia: Secondary | ICD-10-CM | POA: Diagnosis not present

## 2015-10-20 DIAGNOSIS — Z23 Encounter for immunization: Secondary | ICD-10-CM | POA: Diagnosis not present

## 2015-10-20 DIAGNOSIS — J9612 Chronic respiratory failure with hypercapnia: Secondary | ICD-10-CM | POA: Diagnosis not present

## 2015-10-20 DIAGNOSIS — R062 Wheezing: Secondary | ICD-10-CM | POA: Diagnosis not present

## 2015-10-20 DIAGNOSIS — R069 Unspecified abnormalities of breathing: Secondary | ICD-10-CM | POA: Diagnosis not present

## 2015-10-20 DIAGNOSIS — E6609 Other obesity due to excess calories: Secondary | ICD-10-CM | POA: Diagnosis not present

## 2015-10-20 DIAGNOSIS — R69 Illness, unspecified: Secondary | ICD-10-CM | POA: Diagnosis not present

## 2015-10-20 DIAGNOSIS — J9601 Acute respiratory failure with hypoxia: Secondary | ICD-10-CM | POA: Diagnosis not present

## 2015-10-20 DIAGNOSIS — R7989 Other specified abnormal findings of blood chemistry: Secondary | ICD-10-CM | POA: Diagnosis not present

## 2015-10-20 DIAGNOSIS — E1165 Type 2 diabetes mellitus with hyperglycemia: Secondary | ICD-10-CM | POA: Diagnosis not present

## 2015-10-20 DIAGNOSIS — J440 Chronic obstructive pulmonary disease with acute lower respiratory infection: Secondary | ICD-10-CM | POA: Diagnosis not present

## 2015-10-20 DIAGNOSIS — G4733 Obstructive sleep apnea (adult) (pediatric): Secondary | ICD-10-CM | POA: Diagnosis not present

## 2015-10-20 DIAGNOSIS — J9600 Acute respiratory failure, unspecified whether with hypoxia or hypercapnia: Secondary | ICD-10-CM | POA: Diagnosis not present

## 2015-10-20 DIAGNOSIS — J9801 Acute bronchospasm: Secondary | ICD-10-CM | POA: Diagnosis not present

## 2015-10-20 DIAGNOSIS — R0602 Shortness of breath: Secondary | ICD-10-CM | POA: Diagnosis not present

## 2015-10-20 DIAGNOSIS — Z6827 Body mass index (BMI) 27.0-27.9, adult: Secondary | ICD-10-CM | POA: Diagnosis not present

## 2015-10-23 ENCOUNTER — Ambulatory Visit (HOSPITAL_COMMUNITY): Admission: RE | Admit: 2015-10-23 | Payer: Medicare HMO | Source: Ambulatory Visit | Admitting: Ophthalmology

## 2015-10-23 ENCOUNTER — Encounter (HOSPITAL_COMMUNITY): Admission: RE | Payer: Self-pay | Source: Ambulatory Visit

## 2015-10-23 SURGERY — PHACOEMULSIFICATION, CATARACT, WITH IOL INSERTION
Anesthesia: Monitor Anesthesia Care | Site: Eye | Laterality: Right

## 2015-10-23 MED ORDER — EPINEPHRINE HCL 1 MG/ML IJ SOLN
INTRAMUSCULAR | Status: AC
  Filled 2015-10-23: qty 1

## 2015-10-24 DIAGNOSIS — J440 Chronic obstructive pulmonary disease with acute lower respiratory infection: Secondary | ICD-10-CM | POA: Diagnosis not present

## 2015-10-25 DIAGNOSIS — J440 Chronic obstructive pulmonary disease with acute lower respiratory infection: Secondary | ICD-10-CM | POA: Diagnosis not present

## 2015-10-26 DIAGNOSIS — J440 Chronic obstructive pulmonary disease with acute lower respiratory infection: Secondary | ICD-10-CM | POA: Diagnosis not present

## 2015-10-27 DIAGNOSIS — R69 Illness, unspecified: Secondary | ICD-10-CM | POA: Diagnosis not present

## 2015-10-27 DIAGNOSIS — J440 Chronic obstructive pulmonary disease with acute lower respiratory infection: Secondary | ICD-10-CM | POA: Diagnosis not present

## 2015-10-28 DIAGNOSIS — J440 Chronic obstructive pulmonary disease with acute lower respiratory infection: Secondary | ICD-10-CM | POA: Diagnosis not present

## 2015-10-29 DIAGNOSIS — J440 Chronic obstructive pulmonary disease with acute lower respiratory infection: Secondary | ICD-10-CM | POA: Diagnosis not present

## 2015-10-30 DIAGNOSIS — J440 Chronic obstructive pulmonary disease with acute lower respiratory infection: Secondary | ICD-10-CM | POA: Diagnosis not present

## 2015-10-31 DIAGNOSIS — J440 Chronic obstructive pulmonary disease with acute lower respiratory infection: Secondary | ICD-10-CM | POA: Diagnosis not present

## 2015-11-01 DIAGNOSIS — J440 Chronic obstructive pulmonary disease with acute lower respiratory infection: Secondary | ICD-10-CM | POA: Diagnosis not present

## 2015-11-02 DIAGNOSIS — J440 Chronic obstructive pulmonary disease with acute lower respiratory infection: Secondary | ICD-10-CM | POA: Diagnosis not present

## 2015-11-03 ENCOUNTER — Emergency Department (HOSPITAL_COMMUNITY): Payer: Medicare HMO

## 2015-11-03 ENCOUNTER — Encounter (HOSPITAL_COMMUNITY): Payer: Self-pay | Admitting: Emergency Medicine

## 2015-11-03 ENCOUNTER — Inpatient Hospital Stay (HOSPITAL_COMMUNITY)
Admission: EM | Admit: 2015-11-03 | Discharge: 2015-11-08 | DRG: 190 | Disposition: A | Discharge status: Home / Self Care | Payer: Medicare HMO | Attending: Internal Medicine | Admitting: Internal Medicine

## 2015-11-03 DIAGNOSIS — E871 Hypo-osmolality and hyponatremia: Secondary | ICD-10-CM | POA: Diagnosis present

## 2015-11-03 DIAGNOSIS — T502X5A Adverse effect of carbonic-anhydrase inhibitors, benzothiadiazides and other diuretics, initial encounter: Secondary | ICD-10-CM | POA: Diagnosis present

## 2015-11-03 DIAGNOSIS — Z9981 Dependence on supplemental oxygen: Secondary | ICD-10-CM

## 2015-11-03 DIAGNOSIS — I10 Essential (primary) hypertension: Secondary | ICD-10-CM | POA: Diagnosis not present

## 2015-11-03 DIAGNOSIS — I482 Chronic atrial fibrillation: Secondary | ICD-10-CM | POA: Diagnosis not present

## 2015-11-03 DIAGNOSIS — J9621 Acute and chronic respiratory failure with hypoxia: Secondary | ICD-10-CM | POA: Diagnosis present

## 2015-11-03 DIAGNOSIS — R0602 Shortness of breath: Secondary | ICD-10-CM | POA: Diagnosis not present

## 2015-11-03 DIAGNOSIS — R000 Tachycardia, unspecified: Secondary | ICD-10-CM | POA: Diagnosis present

## 2015-11-03 DIAGNOSIS — Z885 Allergy status to narcotic agent status: Secondary | ICD-10-CM

## 2015-11-03 DIAGNOSIS — E0781 Sick-euthyroid syndrome: Secondary | ICD-10-CM | POA: Diagnosis present

## 2015-11-03 DIAGNOSIS — R69 Illness, unspecified: Secondary | ICD-10-CM | POA: Diagnosis not present

## 2015-11-03 DIAGNOSIS — Z882 Allergy status to sulfonamides status: Secondary | ICD-10-CM

## 2015-11-03 DIAGNOSIS — N179 Acute kidney failure, unspecified: Secondary | ICD-10-CM | POA: Diagnosis present

## 2015-11-03 DIAGNOSIS — F419 Anxiety disorder, unspecified: Secondary | ICD-10-CM | POA: Diagnosis present

## 2015-11-03 DIAGNOSIS — J440 Chronic obstructive pulmonary disease with acute lower respiratory infection: Secondary | ICD-10-CM | POA: Diagnosis not present

## 2015-11-03 DIAGNOSIS — Z880 Allergy status to penicillin: Secondary | ICD-10-CM

## 2015-11-03 DIAGNOSIS — Z87891 Personal history of nicotine dependence: Secondary | ICD-10-CM | POA: Diagnosis not present

## 2015-11-03 DIAGNOSIS — Z6827 Body mass index (BMI) 27.0-27.9, adult: Secondary | ICD-10-CM

## 2015-11-03 DIAGNOSIS — R0682 Tachypnea, not elsewhere classified: Secondary | ICD-10-CM | POA: Diagnosis not present

## 2015-11-03 DIAGNOSIS — Z794 Long term (current) use of insulin: Secondary | ICD-10-CM

## 2015-11-03 DIAGNOSIS — J441 Chronic obstructive pulmonary disease with (acute) exacerbation: Principal | ICD-10-CM | POA: Diagnosis present

## 2015-11-03 DIAGNOSIS — I959 Hypotension, unspecified: Secondary | ICD-10-CM | POA: Diagnosis present

## 2015-11-03 DIAGNOSIS — K219 Gastro-esophageal reflux disease without esophagitis: Secondary | ICD-10-CM | POA: Diagnosis present

## 2015-11-03 DIAGNOSIS — I252 Old myocardial infarction: Secondary | ICD-10-CM

## 2015-11-03 DIAGNOSIS — D649 Anemia, unspecified: Secondary | ICD-10-CM | POA: Diagnosis present

## 2015-11-03 DIAGNOSIS — T486X5A Adverse effect of antiasthmatics, initial encounter: Secondary | ICD-10-CM | POA: Diagnosis present

## 2015-11-03 DIAGNOSIS — Z7951 Long term (current) use of inhaled steroids: Secondary | ICD-10-CM

## 2015-11-03 DIAGNOSIS — J449 Chronic obstructive pulmonary disease, unspecified: Secondary | ICD-10-CM | POA: Diagnosis not present

## 2015-11-03 DIAGNOSIS — E860 Dehydration: Secondary | ICD-10-CM | POA: Diagnosis present

## 2015-11-03 DIAGNOSIS — R946 Abnormal results of thyroid function studies: Secondary | ICD-10-CM | POA: Diagnosis not present

## 2015-11-03 DIAGNOSIS — E119 Type 2 diabetes mellitus without complications: Secondary | ICD-10-CM

## 2015-11-03 DIAGNOSIS — Z79899 Other long term (current) drug therapy: Secondary | ICD-10-CM

## 2015-11-03 DIAGNOSIS — I2510 Atherosclerotic heart disease of native coronary artery without angina pectoris: Secondary | ICD-10-CM | POA: Diagnosis present

## 2015-11-03 DIAGNOSIS — Z66 Do not resuscitate: Secondary | ICD-10-CM | POA: Diagnosis present

## 2015-11-03 DIAGNOSIS — Z7982 Long term (current) use of aspirin: Secondary | ICD-10-CM

## 2015-11-03 DIAGNOSIS — E669 Obesity, unspecified: Secondary | ICD-10-CM | POA: Diagnosis present

## 2015-11-03 LAB — CBC WITH DIFFERENTIAL/PLATELET
BASOS PCT: 0 %
Basophils Absolute: 0 10*3/uL (ref 0.0–0.1)
EOS ABS: 0.1 10*3/uL (ref 0.0–0.7)
Eosinophils Relative: 1 %
HCT: 32.5 % — ABNORMAL LOW (ref 36.0–46.0)
HEMOGLOBIN: 9.8 g/dL — AB (ref 12.0–15.0)
Lymphocytes Relative: 15 %
Lymphs Abs: 1.3 10*3/uL (ref 0.7–4.0)
MCH: 28.7 pg (ref 26.0–34.0)
MCHC: 30.2 g/dL (ref 30.0–36.0)
MCV: 95.3 fL (ref 78.0–100.0)
MONOS PCT: 8 %
Monocytes Absolute: 0.7 10*3/uL (ref 0.1–1.0)
NEUTROS PCT: 76 %
Neutro Abs: 6.5 10*3/uL (ref 1.7–7.7)
PLATELETS: 262 10*3/uL (ref 150–400)
RBC: 3.41 MIL/uL — ABNORMAL LOW (ref 3.87–5.11)
RDW: 14.8 % (ref 11.5–15.5)
WBC: 8.6 10*3/uL (ref 4.0–10.5)

## 2015-11-03 LAB — BASIC METABOLIC PANEL
ANION GAP: 7 (ref 5–15)
BUN: 20 mg/dL (ref 6–20)
CALCIUM: 9.6 mg/dL (ref 8.9–10.3)
CO2: 30 mmol/L (ref 22–32)
Chloride: 97 mmol/L — ABNORMAL LOW (ref 101–111)
Creatinine, Ser: 1.87 mg/dL — ABNORMAL HIGH (ref 0.44–1.00)
GFR, EST AFRICAN AMERICAN: 30 mL/min — AB (ref >60)
GFR, EST NON AFRICAN AMERICAN: 26 mL/min — AB (ref >60)
GLUCOSE: 132 mg/dL — AB (ref 65–99)
Potassium: 3.7 mmol/L (ref 3.5–5.1)
Sodium: 134 mmol/L — ABNORMAL LOW (ref 135–145)

## 2015-11-03 LAB — TROPONIN I: Troponin I: <0.03 ng/mL (ref <0.03)

## 2015-11-03 MED ORDER — IPRATROPIUM-ALBUTEROL 0.5-2.5 (3) MG/3ML IN SOLN
3.0000 mL | RESPIRATORY_TRACT | Status: DC
  Administered 2015-11-03: 3 mL via RESPIRATORY_TRACT

## 2015-11-03 MED ORDER — SODIUM CHLORIDE 0.9% FLUSH
3.0000 mL | Freq: Two times a day (BID) | INTRAVENOUS | Status: DC
  Administered 2015-11-03 – 2015-11-07 (×7): 3 mL via INTRAVENOUS

## 2015-11-03 MED ORDER — ENOXAPARIN SODIUM 30 MG/0.3ML ~~LOC~~ SOLN
30.0000 mg | SUBCUTANEOUS | Status: DC
  Administered 2015-11-03 – 2015-11-07 (×5): 30 mg via SUBCUTANEOUS
  Filled 2015-11-03 (×5): qty 0.3

## 2015-11-03 MED ORDER — DOCUSATE SODIUM 100 MG PO CAPS
100.0000 mg | ORAL_CAPSULE | Freq: Two times a day (BID) | ORAL | Status: DC
  Administered 2015-11-03 – 2015-11-08 (×10): 100 mg via ORAL
  Filled 2015-11-03 (×10): qty 1

## 2015-11-03 MED ORDER — ACETAMINOPHEN 325 MG PO TABS: 650.0000 mg | ORAL_TABLET | Freq: Four times a day (QID) | ORAL | Status: DC | PRN

## 2015-11-03 MED ORDER — ALBUTEROL SULFATE (2.5 MG/3ML) 0.083% IN NEBU
2.5000 mg | INHALATION_SOLUTION | RESPIRATORY_TRACT | Status: DC | PRN
  Administered 2015-11-06 – 2015-11-07 (×3): 2.5 mg via RESPIRATORY_TRACT
  Filled 2015-11-03 (×3): qty 3

## 2015-11-03 MED ORDER — LORAZEPAM 2 MG/ML IJ SOLN
1.0000 mg | INTRAMUSCULAR | Status: DC | PRN
  Administered 2015-11-05 – 2015-11-06 (×2): 1 mg via INTRAVENOUS
  Filled 2015-11-03 (×2): qty 1

## 2015-11-03 MED ORDER — METHYLPREDNISOLONE SODIUM SUCC 125 MG IJ SOLR
125.0000 mg | Freq: Once | INTRAMUSCULAR | Status: AC
  Administered 2015-11-03: 125 mg via INTRAVENOUS
  Filled 2015-11-03: qty 2

## 2015-11-03 MED ORDER — IPRATROPIUM-ALBUTEROL 0.5-2.5 (3) MG/3ML IN SOLN
RESPIRATORY_TRACT | Status: AC
  Filled 2015-11-03: qty 3

## 2015-11-03 MED ORDER — ACETAMINOPHEN 650 MG RE SUPP: 650.0000 mg | Freq: Four times a day (QID) | RECTAL | Status: DC | PRN

## 2015-11-03 MED ORDER — IPRATROPIUM BROMIDE 0.02 % IN SOLN
1.0000 mg | Freq: Once | RESPIRATORY_TRACT | Status: AC
  Administered 2015-11-03: 1 mg via RESPIRATORY_TRACT
  Filled 2015-11-03: qty 5

## 2015-11-03 MED ORDER — ONDANSETRON HCL 4 MG PO TABS: 4.0000 mg | ORAL_TABLET | Freq: Four times a day (QID) | ORAL | Status: DC | PRN

## 2015-11-03 MED ORDER — FLUTICASONE FUROATE-VILANTEROL 100-25 MCG/INH IN AEPB
1.0000 | INHALATION_SPRAY | Freq: Every day | RESPIRATORY_TRACT | Status: DC
  Administered 2015-11-05 – 2015-11-08 (×4): 1 via RESPIRATORY_TRACT
  Filled 2015-11-03: qty 28

## 2015-11-03 MED ORDER — METOPROLOL TARTRATE 25 MG PO TABS
25.0000 mg | ORAL_TABLET | Freq: Two times a day (BID) | ORAL | Status: DC
  Administered 2015-11-03: 25 mg via ORAL
  Filled 2015-11-03 (×2): qty 1

## 2015-11-03 MED ORDER — TRAMADOL HCL 50 MG PO TABS
50.0000 mg | ORAL_TABLET | Freq: Four times a day (QID) | ORAL | Status: DC | PRN
  Administered 2015-11-05 – 2015-11-08 (×3): 50 mg via ORAL
  Filled 2015-11-03 (×4): qty 1

## 2015-11-03 MED ORDER — ALBUTEROL (5 MG/ML) CONTINUOUS INHALATION SOLN
10.0000 mg/h | INHALATION_SOLUTION | Freq: Once | RESPIRATORY_TRACT | Status: AC
  Administered 2015-11-03: 10 mg/h via RESPIRATORY_TRACT
  Filled 2015-11-03: qty 20

## 2015-11-03 MED ORDER — ONDANSETRON HCL 4 MG/2ML IJ SOLN: 4.0000 mg | Freq: Four times a day (QID) | INTRAMUSCULAR | Status: DC | PRN

## 2015-11-03 MED ORDER — ACETAZOLAMIDE 250 MG PO TABS
250.0000 mg | ORAL_TABLET | Freq: Two times a day (BID) | ORAL | Status: DC
  Administered 2015-11-04 – 2015-11-08 (×9): 250 mg via ORAL
  Filled 2015-11-03 (×14): qty 1

## 2015-11-03 MED ORDER — ASPIRIN 81 MG PO CHEW
81.0000 mg | CHEWABLE_TABLET | Freq: Every day | ORAL | Status: DC
  Administered 2015-11-04 – 2015-11-08 (×5): 81 mg via ORAL
  Filled 2015-11-03 (×5): qty 1

## 2015-11-03 MED ORDER — BECLOMETHASONE DIPROPIONATE 40 MCG/ACT IN AERS: 1.0000 | INHALATION_SPRAY | Freq: Two times a day (BID) | RESPIRATORY_TRACT | Status: DC

## 2015-11-03 MED ORDER — BUDESONIDE 0.25 MG/2ML IN SUSP
0.2500 mg | Freq: Two times a day (BID) | RESPIRATORY_TRACT | Status: DC
  Administered 2015-11-03 – 2015-11-08 (×10): 0.25 mg via RESPIRATORY_TRACT
  Filled 2015-11-03 (×11): qty 2

## 2015-11-03 MED ORDER — SODIUM CHLORIDE 0.9 % IV SOLN: INTRAVENOUS | Status: DC

## 2015-11-03 MED ORDER — BUSPIRONE HCL 15 MG PO TABS
15.0000 mg | ORAL_TABLET | Freq: Two times a day (BID) | ORAL | Status: DC
  Administered 2015-11-03 – 2015-11-08 (×10): 15 mg via ORAL
  Filled 2015-11-03 (×10): qty 1

## 2015-11-03 MED ORDER — SODIUM CHLORIDE 0.9 % IV BOLUS (SEPSIS)
500.0000 mL | Freq: Once | INTRAVENOUS | Status: AC
  Administered 2015-11-03: 500 mL via INTRAVENOUS

## 2015-11-03 NOTE — H&P (Signed)
History and Physical    Sheila BruinsWendy M Mccluney WUJ:[id number]:[id number] DOB: 08/23/1945 DOA: 11/03/2015  PCP: No PCP Per Patient - "he dumped me" almost 2 months ago Consultants:  None Patient coming from: home - lives with sons; NOK: Lonna CobbScottie Roberts, [phone]  Chief Complaint: "Couldn't handle stuff at home"  HPI: Sheila Pierce is a 70 y.o. female with medical history significant of chronic respiratory failure from COPD;  H/o NSTEMI; HTN; GERD; and anxiety.  She reports that she "couldn't handle stuff at home, it was getting on my nerves."  Feeling anxious.  Slight cough, no change from baseline.  No change in ambulatory status.  On 3L home O2, has not needed more.  Has not required more breathing treatments than usual.  This history is different from that which was provided to the ER doctor, Dr. Clarene DukeMcManus: Pt was seen at 1355.  Per pt, c/o gradual onset and worsening of persistent cough, wheezing and SOB for the past 2 days.  Describes her symptoms as "my COPD is acting up."  Has been using home O2, MDI and nebs without relief.  Denies CP/palpitations, no back pain, no abd pain, no N/V/D, no fevers, no rash.   ED Course: Per Dr. Clarene DukeMcManus: [id number]:  On arrival: pt sitting upright, tachypneic, tachycardic, Sats 99 % on her usual O2 3L N/C, lungs diminished. IV solumedrol and hour long neb started. After neb: pt appears more comfortable at rest, less tachypneic, Sats 99 % on O2 3L N/C, lungs continue diminished. Pt started to move off stretcher from sit to stand and c/o increasing SOB, with increasing HR and RR. Pt laid back on stretcher with O2 Sats remaining 97 % on O2 3L N/C. Dx and testing d/w pt and family.  Questions answered.  Verb understanding, agreeable to admit. T/C to [facility] Dr. Ophelia CharterYates, case discussed, including:  HPI, pertinent PM/SHx, VS/PE, dx testing, ED course and treatment:  Agreeable to admit, requests to write temporary orders, obtain tele bed to team APAdmits.  Review of Systems: As per HPI; otherwise  10 point review of systems reviewed and negative.   Ambulatory Status:  Doesn't walk much, has walker and cane at home  Past Medical History:  Diagnosis Date  . Anxiety   . Arthritis   . Asthma   . COPD (chronic obstructive pulmonary disease) (HCC)    Chronic resp failure on home O2  . GERD (gastroesophageal reflux disease)   . Hyperglycemia    Noted 09/2011 admission in setting of steroid use with normal HgbA1C.  Marland Kitchen. Hypertension   . Hypokalemia   . Hyponatremia    Noted 09/2011 admission.  . NSTEMI (non-ST elevated myocardial infarction) (HCC)    a. 09/2011 in setting of acute on chronic resp failure/COPD exacerbation --> cath demonstrated nonobstructive CAD 10/05/11 with EF 50%;  08/2012 elevated Ti in setting of COPD flare -->Echo: EF 60-65%, Gr 1 DD -->Med Rx.  . QT prolongation    Noted on EKG 09/2011 (590 in setting of K of 3, improved to 475 by discharge)    Past Surgical History:  Procedure Laterality Date  . ABDOMINAL HYSTERECTOMY    . CHOLECYSTECTOMY    . LEFT HEART CATHETERIZATION WITH CORONARY ANGIOGRAM N/A 10/05/2011   Procedure: LEFT HEART CATHETERIZATION WITH CORONARY ANGIOGRAM;  Surgeon: Tonny BollmanMichael Cooper, MD;  [facility];  Service: Cardiovascular;  Laterality: N/A;  . PLANTAR'S WART EXCISION      Social History   Social History  . Marital status: Unknown  Spouse name: N/A  . Number of children: N/A  . Years of education: N/A   Occupational History  . retired    Social History Main Topics  . Smoking status: Former Smoker    Packs/day: 1.00    Years: 40.00    Types: Cigarettes    Start date: 01/26/1964    Quit date: 10/2014  . Smokeless tobacco: Never Used     Comment: 1 pack every 3 days  . Alcohol use No  . Drug use: No  . Sexual activity: Not on file   Other Topics Concern  . Not on file   Social History Narrative   Her son and her adult grandson live with her.     Allergies  Allergen Reactions  . Penicillins Swelling  . Codeine  Nausea Only  . Hydrocodone Nausea Only  . Lorcet [Hydrocodone-Acetaminophen] Other (See Comments)       . Sulfa Antibiotics     Hallucinations     Family History  Problem Relation Age of Onset  . Cancer Father     Lung  . Cancer Mother     Liver    Prior to Admission medications   Medication Sig Start Date End Date Taking? Authorizing Provider  acetaZOLAMIDE (DIAMOX) 250 MG tablet Take 250 mg by mouth 2 (two) times daily.    Yes Historical Provider, MD  albuterol (PROVENTIL HFA;VENTOLIN HFA) 108 (90 BASE) MCG/ACT inhaler Inhale 2 puffs into the lungs every 4 (four) hours as needed for wheezing or shortness of breath.    Yes Historical Provider, MD  aspirin 81 MG tablet Take 81 mg by mouth daily.   Yes Historical Provider, MD  beclomethasone (QVAR) 40 MCG/ACT inhaler Inhale 1 puff into the lungs 2 (two) times daily.   Yes Historical Provider, MD  BREO ELLIPTA 100-25 MCG/INH AEPB INHALE 1 PUFF BY MOUTH AT THE SAME TIME EACH DAY 09/29/15  Yes Historical Provider, MD  busPIRone (BUSPAR) 15 MG tablet Take 15 mg by mouth 2 (two) times daily.    Yes Historical Provider, MD  Cholecalciferol 1000 units capsule Take 1,000 Units by mouth daily.    Yes Historical Provider, MD  insulin regular (NOVOLIN R,HUMULIN R) 100 units/mL injection Inject 1-10 Units into the skin 3 (three) times daily before meals. Per sliding scale instructions::: 0-150= 0 units 151-200= 2 units 201-250= 4 units 251-300= 6 units 301-350= 8 units 351-400=10units   Yes Historical Provider, MD  ipratropium-albuterol (DUONEB) 0.5-2.5 (3) MG/3ML SOLN Take 3 mLs by nebulization every 4 (four) hours.   Yes Historical Provider, MD  lisinopril-hydrochlorothiazide (PRINZIDE,ZESTORETIC) 20-25 MG tablet Take 1 tablet by mouth daily.   Yes Historical Provider, MD  metoprolol tartrate (LOPRESSOR) 25 MG tablet Take 25 mg by mouth 2 (two) times daily.   Yes Historical Provider, MD  moxifloxacin (VIGAMOX) 0.5 % ophthalmic solution 1 drop  3 (three) times daily.   Yes Historical Provider, MD  Multiple Vitamin (MULTIVITAMIN WITH MINERALS) TABS tablet Take 1 tablet by mouth daily.   Yes Historical Provider, MD  potassium chloride (K-DUR) 10 MEQ tablet Take 10 mEq by mouth daily. 09/29/15  Yes Historical Provider, MD  tiotropium (SPIRIVA HANDIHALER) 18 MCG inhalation capsule Place 1 puff into inhaler and inhale daily.    Yes Historical Provider, MD  traMADol (ULTRAM) 50 MG tablet Take 50 mg by mouth every 6 (six) hours as needed for moderate pain or severe pain.    Yes Historical Provider, MD    Physical Exam: Vitals:  11/03/15 1900 11/03/15 1905 11/03/15 1923 11/03/15 1930  BP: (!) 86/58 (!) 86/58 94/63 (!) 96/51  Pulse: (!) 122 (!) 126 (!) 122 (!) 121  Resp: 17 23 17 19   Temp:  98.4 F (36.9 C)    TempSrc:      SpO2: 98% 96% 98% 99%  Weight:      Height:         General:  Appears calm and comfortable and is NAD, is on her home O2 and reports no increased dyspnea compared to baseline Eyes:  PERRL, EOMI, normal lids, iris ENT:  grossly normal hearing, lips & tongue, mmm Neck:  no LAD, masses or thyromegaly Cardiovascular:  tachycardia, no m/r/g. No LE edema.  Respiratory:  CTA bilaterally, no w/r/r. Faint scattered wheezes.  Increased respiratory effort with pursed lip breathing. Abdomen:  soft, ntnd, NABS Skin:  no rash or induration seen on limited exam Musculoskeletal:  grossly normal tone BUE/BLE, good ROM, no bony abnormality Psychiatric:  anxious, speech fluent and appropriate, AOx3 Neurologic:  CN 2-12 grossly intact, moves all extremities in coordinated fashion, sensation intact  Labs on Admission: I have personally reviewed following labs and imaging studies  CBC:  Recent Labs Lab 11/03/15 1456  WBC 8.6  NEUTROABS 6.5  HGB 9.8*  HCT 32.5*  MCV 95.3  PLT 262   Basic Metabolic Panel:  Recent Labs Lab 11/03/15 1456  NA 134*  K 3.7  CL 97*  CO2 30  GLUCOSE 132*  BUN 20  CREATININE 1.87*    CALCIUM 9.6   GFR: Estimated Creatinine Clearance: 23 mL/min (by C-G formula based on SCr of 1.87 mg/dL (H)). Liver Function Tests: No results for input(s): AST, ALT, ALKPHOS, BILITOT, PROT, ALBUMIN in the last 168 hours. No results for input(s): LIPASE, AMYLASE in the last 168 hours. No results for input(s): AMMONIA in the last 168 hours. Coagulation Profile: No results for input(s): INR, PROTIME in the last 168 hours. Cardiac Enzymes:  Recent Labs Lab 11/03/15 1456  TROPONINI <0.03   BNP (last 3 results) No results for input(s): PROBNP in the last 8760 hours. HbA1C: No results for input(s): HGBA1C in the last 72 hours. CBG: No results for input(s): GLUCAP in the last 168 hours. Lipid Profile: No results for input(s): CHOL, HDL, LDLCALC, TRIG, CHOLHDL, LDLDIRECT in the last 72 hours. Thyroid Function Tests: No results for input(s): TSH, T4TOTAL, FREET4, T3FREE, THYROIDAB in the last 72 hours. Anemia Panel: No results for input(s): VITAMINB12, FOLATE, FERRITIN, TIBC, IRON, RETICCTPCT in the last 72 hours. Urine analysis: No results found for: COLORURINE, APPEARANCEUR, LABSPEC, PHURINE, GLUCOSEU, HGBUR, BILIRUBINUR, KETONESUR, PROTEINUR, UROBILINOGEN, NITRITE, LEUKOCYTESUR  Creatinine Clearance: Estimated Creatinine Clearance: 23 mL/min (by C-G formula based on SCr of 1.87 mg/dL (H)).  Sepsis Labs: @LABRCNTIP (procalcitonin:4,lacticidven:4) )No results found for this or any previous visit (from the past 240 hour(s)).   Radiological Exams on Admission: Dg Chest Port 1 View  Result Date: 11/03/2015 CLINICAL DATA:  Shortness of breath. EXAM: PORTABLE CHEST 1 VIEW COMPARISON:  Radiograph of October 20, 2015. FINDINGS: The heart size and mediastinal contours are within normal limits. Both lungs are clear. Atherosclerosis of thoracic aorta is noted. No pneumothorax or pleural effusion is noted. The visualized skeletal structures are unremarkable. IMPRESSION: Aortic  atherosclerosis.  No acute cardiopulmonary abnormality seen. Electronically Signed   By: Lupita Raider, M.D.   On: 11/03/2015 14:25    EKG: Not done  Assessment/Plan Principal Problem:   Acute exacerbation of chronic obstructive pulmonary disease (  COPD) (HCC) Active Problems:   Hypertension   Hyponatremia   AKI (acute kidney injury) (HCC)   Anemia   Anxiety   COPD -Patient has chronic respiratory failure requiring 3L  O2 at baseline -She denies acute symptoms including increased need for nebulizer treatments or increased O2 -She reports primarily anxiety as cause of increased WOB -Her family's story is somewhat different -For now, will monitor overnight in Observation -No antibiotics - denies cardinal symptoms -Continue home O2 -Continue home medications including Duonebs, Qvar, Breo Ellipta.  D/c Spiriva as there is no indication to use Spiriva and Atrovent in Duonebs. -Albuterol nebs prn. -Patient denies smoking but this is not all that clear.  Anxiety -patient does appear to have underlying anxiety -This may be related to dyspnea, as the sensation of struggling to breathe does cause significant anxiety -She takes BuSpar for this -Will also order Ativan for prn use  HTN -Patient with baseline HTN, but has had relative hypotension tonight -Will hold lisinopril-HCTZ (also with AKI) -Continue Lopressor to avoid (further) rebound tachycardia and treat current tachycardia  Hyponatremia -likely related to diuretic therapy. -Appears to be mild and chronic in nature. -Will follow.  AKI -Prior creatinine in 9/17 was 1.04, currently 1.87 -Suspect mild volume deficiency -Will rehydrate and follow.  Anemia -Stable from prior. -Will follow.  DVT prophylaxis: Lovenox  Code Status: DNR - confirmed with patient Family Communication: Family not present, called and left message for son Lonna Cobb, [phone]) at patient's request Disposition Plan:  Home once  clinically improved, likely in 1-2 days Consults called: None  Admission status: It is my clinical opinion that referral for OBSERVATION is reasonable and necessary in this patient based on the above information provided. The aforementioned taken together are felt to place the patient at high risk for further clinical deterioration. However it is anticipated that the patient may be medically stable for discharge from the hospital within 24 to 48 hours.    Jonah Blue MD [facility]  If 7PM-7AM, please contact night-coverage [email or web address] Password [email or web address]  11/03/2015, 8:16 PM

## 2015-11-03 NOTE — ED Triage Notes (Signed)
EMS states that the patient began having breathing problems and SOB 2 days ago. Pt shook her head yes. Pt presents with pursed lip breathing, hx of COPD, and dyspnea. EMS advised the patient was 92% on 2 liters by a 50' oxygen cannula attached to a concentrator.

## 2015-11-03 NOTE — ED Notes (Addendum)
Patient has given verbal consent to discuss information with her personal Aid on the phone.

## 2015-11-03 NOTE — ED Notes (Signed)
Tried to get O2 while ambulating but Pt stated she "wasn't going to make it" and sat back down

## 2015-11-03 NOTE — ED Provider Notes (Signed)
[facility]Y DEPT Provider Note   CSN: [id number] Arrival date & time: 11/03/15  1329     History   Chief Complaint Chief Complaint  Patient presents with  . Respiratory Distress  . COPD    HPI Sheila Pierce is a 70 y.o. female.  HPI  Pt was seen at 1355.  Per pt, c/o gradual onset and worsening of persistent cough, wheezing and SOB for the past 2 days.  Describes her symptoms as "my COPD is acting up."  Has been using home O2, MDI and nebs without relief.  Denies CP/palpitations, no back pain, no abd pain, no N/V/D, no fevers, no rash.    Past Medical History:  Diagnosis Date  . Anxiety   . Arthritis   . Asthma   . COPD (chronic obstructive pulmonary disease) (HCC)    Chronic resp failure on home O2  . GERD (gastroesophageal reflux disease)   . Hyperglycemia    Noted 09/2011 admission in setting of steroid use with normal HgbA1C.  Marland Kitchen Hypertension   . Hypokalemia   . Hyponatremia    Noted 09/2011 admission.  . NSTEMI (non-ST elevated myocardial infarction) (HCC)    a. 09/2011 in setting of acute on chronic resp failure/COPD exacerbation --> cath demonstrated nonobstructive CAD 10/05/11 with EF 50%;  08/2012 elevated Ti in setting of COPD flare -->Echo: EF 60-65%, Gr 1 DD -->Med Rx.  . QT prolongation    Noted on EKG 09/2011 (590 in setting of K of 3, improved to 475 by discharge)    Patient Active Problem List   Diagnosis Date Noted  . Acute exacerbation of chronic obstructive pulmonary disease (COPD) (HCC) 09/19/2012  . Tobacco abuse 09/19/2012  . CAD (coronary artery disease) 11/24/2011  . Dyslipidemia 11/24/2011  . COPD (chronic obstructive pulmonary disease) (HCC) 11/24/2011  . Hypertension   . QT prolongation   . Hypokalemia   . MI, acute, non ST segment elevation (HCC) 10/05/2011    Past Surgical History:  Procedure Laterality Date  . ABDOMINAL HYSTERECTOMY    . CHOLECYSTECTOMY    . LEFT HEART CATHETERIZATION WITH CORONARY ANGIOGRAM N/A 10/05/2011   Procedure: LEFT HEART CATHETERIZATION WITH CORONARY ANGIOGRAM;  Surgeon: Tonny Bollman, MD;  Location: [facility] CATH LAB;  Service: Cardiovascular;  Laterality: N/A;  . PLANTAR'S WART EXCISION         Home Medications    Prior to Admission medications   Medication Sig Start Date End Date Taking? Authorizing Provider  acetaZOLAMIDE (DIAMOX) 250 MG tablet Take 250 mg by mouth 3 (three) times daily.    Historical Provider, MD  albuterol (PROVENTIL HFA;VENTOLIN HFA) 108 (90 BASE) MCG/ACT inhaler Inhale 2 puffs into the lungs every 4 (four) hours as needed. For shortness of breath.    Historical Provider, MD  aspirin 81 MG tablet Take 81 mg by mouth daily.    Historical Provider, MD  BREO ELLIPTA 100-25 MCG/INH AEPB INHALE 1 PUFF BY MOUTH AT THE SAME TIME EACH DAY 09/29/15   Historical Provider, MD  busPIRone (BUSPAR) 15 MG tablet Take 15 mg by mouth 3 (three) times daily.    Historical Provider, MD  cholecalciferol (VITAMIN D) 400 units TABS tablet Take 400 Units by mouth.    Historical Provider, MD  hydrochlorothiazide (HYDRODIURIL) 25 MG tablet Take 25 mg by mouth daily.    Historical Provider, MD  lisinopril (PRINIVIL,ZESTRIL) 20 MG tablet Take 20 mg by mouth daily.    Historical Provider, MD  metoprolol tartrate (LOPRESSOR) 25 MG tablet Take  25 mg by mouth 2 (two) times daily.    Historical Provider, MD  potassium chloride (K-DUR) 10 MEQ tablet Take 10 mEq by mouth daily. 09/29/15   Historical Provider, MD  Tiotropium Bromide Monohydrate (SPIRIVA HANDIHALER IN) Inhale into the lungs.    Historical Provider, MD  traMADol (ULTRAM) 50 MG tablet Take 50 mg by mouth every 6 (six) hours as needed.    Historical Provider, MD    Family History Family History  Problem Relation Age of Onset  . Cancer Father     Lung  . Cancer Mother     Liver    Social History Social History  Substance Use Topics  . Smoking status: Former Smoker    Packs/day: 0.50    Years: 40.00    Types: Cigarettes    Start  date: 01/26/1964    Quit date: 10/2014  . Smokeless tobacco: Never Used     Comment: 1 pack every 3 days  . Alcohol use No     Allergies   Penicillins; Codeine; Hydrocodone; Lorcet [hydrocodone-acetaminophen]; and Sulfa antibiotics   Review of Systems Review of Systems ROS: Statement: All systems negative except as marked or noted in the HPI; Constitutional: Negative for fever and chills. ; ; Eyes: Negative for eye pain, redness and discharge. ; ; ENMT: Negative for ear pain, hoarseness, nasal congestion, sinus pressure and sore throat. ; ; Cardiovascular: Negative for chest pain, palpitations, diaphoresis, and peripheral edema. ; ; Respiratory: +cough, wheezing, SOB. Negative for stridor. ; ; Gastrointestinal: Negative for nausea, vomiting, diarrhea, abdominal pain, blood in stool, hematemesis, jaundice and rectal bleeding. . ; ; Genitourinary: Negative for dysuria, flank pain and hematuria. ; ; Musculoskeletal: Negative for back pain and neck pain. Negative for swelling and trauma.; ; Skin: Negative for pruritus, rash, abrasions, blisters, bruising and skin lesion.; ; Neuro: Negative for headache, lightheadedness and neck stiffness. Negative for weakness, altered level of consciousness, altered mental status, extremity weakness, paresthesias, involuntary movement, seizure and syncope.       Physical Exam Updated Vital Signs BP 101/89   Pulse 119   Temp 98.3 F (36.8 C) (Oral)   Resp 21   Ht 5' (1.524 m)   Wt 140 lb (63.5 kg)   SpO2 100%   BMI 27.34 kg/m   Physical Exam 1400: Physical examination:  Nursing notes reviewed; Vital signs and O2 SAT reviewed;  Constitutional: Well developed, Well nourished, Well hydrated, Uncomfortable appearing.; Head:  Normocephalic, atraumatic; Eyes: EOMI, PERRL, No scleral icterus; ENMT: Mouth and pharynx normal, Mucous membranes moist; Neck: Supple, Full range of motion, No lymphadenopathy; Cardiovascular: Tachycardic rate and rhythm, No gallop;  Respiratory: Breath sounds diminished & equal bilaterally, faint wheezes. No audible wheezing. Speaking short phrases. Sitting upright, tachypneic, pursed lip breathing.;;  Chest: Nontender, Movement normal; Abdomen: Soft, Nontender, Nondistended, Normal bowel sounds; Genitourinary: No CVA tenderness; Extremities: Pulses normal, No tenderness, No edema, No calf edema or asymmetry.; Neuro: AA&Ox3, Major CN grossly intact.  Speech clear. No gross focal motor or sensory deficits in extremities.; Skin: Color normal, Warm, Dry.   ED Treatments / Results  Labs (all labs ordered are listed, but only abnormal results are displayed)   EKG  EKG Interpretation None       Radiology   Procedures Procedures (including critical care time)  Medications Ordered in ED Medications  albuterol (PROVENTIL,VENTOLIN) solution continuous neb (10 mg/hr Nebulization Given 11/03/15 1429)  ipratropium (ATROVENT) nebulizer solution 1 mg (1 mg Nebulization Given 11/03/15 1430)  methylPREDNISolone sodium  succinate (SOLU-MEDROL) 125 mg/2 mL injection 125 mg (125 mg Intravenous Given 11/03/15 1408)     Initial Impression / Assessment and Plan / ED Course  I have reviewed the triage vital signs and the nursing notes.  Pertinent labs & imaging results that were available during my care of the patient were reviewed by me and considered in my medical decision making (see chart for details).  MDM Reviewed: previous chart, nursing note and vitals Reviewed previous: labs and ECG Interpretation: labs, ECG and x-ray Total time providing critical care: 30-74 minutes. This excludes time spent performing separately reportable procedures and services. Consults: admitting MD   CRITICAL CARE Performed by: Laray AngerMCMANUS,Callie Facey M Total critical care time: 35 minutes Critical care time was exclusive of separately billable procedures and treating other patients. Critical care was necessary to treat or prevent imminent or  life-threatening deterioration. Critical care was time spent personally by me on the following activities: development of treatment plan with patient and/or surrogate as well as nursing, discussions with consultants, evaluation of patient's response to treatment, examination of patient, obtaining history from patient or surrogate, ordering and performing treatments and interventions, ordering and review of laboratory studies, ordering and review of radiographic studies, pulse oximetry and re-evaluation of patient's condition.    ED ECG REPORT   Date: 11/03/2015  Rate: 120  Rhythm: sinus tachycardia  QRS Axis: normal  Intervals: normal  ST/T Wave abnormalities: normal  Conduction Disutrbances:none  Narrative Interpretation:   Old EKG Reviewed: changes noted; compared to previous EKG dated 10/16/2015, rate faster.    Results for orders placed or performed during the hospital encounter of 11/03/15  Basic metabolic panel  Result Value Ref Range   Sodium 134 (L) 135 - 145 mmol/L   Potassium 3.7 3.5 - 5.1 mmol/L   Chloride 97 (L) 101 - 111 mmol/L   CO2 30 22 - 32 mmol/L   Glucose, Bld 132 (H) 65 - 99 mg/dL   BUN 20 6 - 20 mg/dL   Creatinine, Ser 8.461.87 (H) 0.44 - 1.00 mg/dL   Calcium 9.6 8.9 - 96.210.3 mg/dL   GFR calc non Af Amer 26 (L) >60 mL/min   GFR calc Af Amer 30 (L) >60 mL/min   Anion gap 7 5 - 15  Troponin I  Result Value Ref Range   Troponin I <0.03 <0.03 ng/mL  CBC with Differential  Result Value Ref Range   WBC 8.6 4.0 - 10.5 K/uL   RBC 3.41 (L) 3.87 - 5.11 MIL/uL   Hemoglobin 9.8 (L) 12.0 - 15.0 g/dL   HCT 95.232.5 (L) 84.136.0 - 32.446.0 %   MCV 95.3 78.0 - 100.0 fL   MCH 28.7 26.0 - 34.0 pg   MCHC 30.2 30.0 - 36.0 g/dL   RDW 40.114.8 02.711.5 - 25.315.5 %   Platelets 262 150 - 400 K/uL   Neutrophils Relative % 76 %   Neutro Abs 6.5 1.7 - 7.7 K/uL   Lymphocytes Relative 15 %   Lymphs Abs 1.3 0.7 - 4.0 K/uL   Monocytes Relative 8 %   Monocytes Absolute 0.7 0.1 - 1.0 K/uL   Eosinophils  Relative 1 %   Eosinophils Absolute 0.1 0.0 - 0.7 K/uL   Basophils Relative 0 %   Basophils Absolute 0.0 0.0 - 0.1 K/uL   Dg Chest Port 1 View  Result Date: 11/03/2015 CLINICAL DATA:  Shortness of breath. EXAM: PORTABLE CHEST 1 VIEW COMPARISON:  Radiograph of October 20, 2015. FINDINGS: The heart size and mediastinal contours  are within normal limits. Both lungs are clear. Atherosclerosis of thoracic aorta is noted. No pneumothorax or pleural effusion is noted. The visualized skeletal structures are unremarkable. IMPRESSION: Aortic atherosclerosis.  No acute cardiopulmonary abnormality seen. Electronically Signed   By: Lupita Raider, M.D.   On: 11/03/2015 14:25    1845:  On arrival: pt sitting upright, tachypneic, tachycardic, Sats 99 % on her usual O2 3L N/C, lungs diminished. IV solumedrol and hour long neb started. After neb: pt appears more comfortable at rest, less tachypneic, Sats 99 % on O2 3L N/C, lungs continue diminished. Pt started to move off stretcher from sit to stand and c/o increasing SOB, with increasing HR and RR (pursed lip breathing). Pt laid back on stretcher with O2 Sats remaining 97 % on O2 3L N/C. Dx and testing d/w pt and family.  Questions answered.  Verb understanding, agreeable to admit. T/C to [facility]. Ophelia Charter, case discussed, including:  HPI, pertinent PM/SHx, VS/PE, dx testing, ED course and treatment:  Agreeable to admit, requests to write temporary orders, obtain tele bed to team [facility].      Final Clinical Impressions(s) / ED Diagnoses   Final diagnoses:  None    New Prescriptions New Prescriptions   No medications on file     Samuel Jester, DO 11/06/15 1823

## 2015-11-04 DIAGNOSIS — I2510 Atherosclerotic heart disease of native coronary artery without angina pectoris: Secondary | ICD-10-CM | POA: Diagnosis present

## 2015-11-04 DIAGNOSIS — J441 Chronic obstructive pulmonary disease with (acute) exacerbation: Secondary | ICD-10-CM | POA: Diagnosis not present

## 2015-11-04 DIAGNOSIS — F419 Anxiety disorder, unspecified: Secondary | ICD-10-CM | POA: Diagnosis present

## 2015-11-04 DIAGNOSIS — D649 Anemia, unspecified: Secondary | ICD-10-CM | POA: Diagnosis not present

## 2015-11-04 DIAGNOSIS — R946 Abnormal results of thyroid function studies: Secondary | ICD-10-CM | POA: Diagnosis not present

## 2015-11-04 DIAGNOSIS — Z66 Do not resuscitate: Secondary | ICD-10-CM | POA: Diagnosis not present

## 2015-11-04 DIAGNOSIS — J9621 Acute and chronic respiratory failure with hypoxia: Secondary | ICD-10-CM | POA: Diagnosis not present

## 2015-11-04 DIAGNOSIS — I10 Essential (primary) hypertension: Secondary | ICD-10-CM | POA: Diagnosis not present

## 2015-11-04 DIAGNOSIS — Z885 Allergy status to narcotic agent status: Secondary | ICD-10-CM | POA: Diagnosis not present

## 2015-11-04 DIAGNOSIS — Z6827 Body mass index (BMI) 27.0-27.9, adult: Secondary | ICD-10-CM | POA: Diagnosis not present

## 2015-11-04 DIAGNOSIS — Z880 Allergy status to penicillin: Secondary | ICD-10-CM | POA: Diagnosis not present

## 2015-11-04 DIAGNOSIS — E119 Type 2 diabetes mellitus without complications: Secondary | ICD-10-CM | POA: Diagnosis not present

## 2015-11-04 DIAGNOSIS — Z9981 Dependence on supplemental oxygen: Secondary | ICD-10-CM | POA: Diagnosis not present

## 2015-11-04 DIAGNOSIS — T486X5A Adverse effect of antiasthmatics, initial encounter: Secondary | ICD-10-CM | POA: Diagnosis present

## 2015-11-04 DIAGNOSIS — E871 Hypo-osmolality and hyponatremia: Secondary | ICD-10-CM | POA: Diagnosis not present

## 2015-11-04 DIAGNOSIS — E860 Dehydration: Secondary | ICD-10-CM | POA: Diagnosis present

## 2015-11-04 DIAGNOSIS — N179 Acute kidney failure, unspecified: Secondary | ICD-10-CM | POA: Diagnosis not present

## 2015-11-04 DIAGNOSIS — E669 Obesity, unspecified: Secondary | ICD-10-CM | POA: Diagnosis present

## 2015-11-04 DIAGNOSIS — E0781 Sick-euthyroid syndrome: Secondary | ICD-10-CM | POA: Diagnosis present

## 2015-11-04 DIAGNOSIS — I959 Hypotension, unspecified: Secondary | ICD-10-CM | POA: Diagnosis not present

## 2015-11-04 DIAGNOSIS — Z87891 Personal history of nicotine dependence: Secondary | ICD-10-CM | POA: Diagnosis not present

## 2015-11-04 DIAGNOSIS — R69 Illness, unspecified: Secondary | ICD-10-CM | POA: Diagnosis not present

## 2015-11-04 DIAGNOSIS — J449 Chronic obstructive pulmonary disease, unspecified: Secondary | ICD-10-CM | POA: Diagnosis not present

## 2015-11-04 DIAGNOSIS — T502X5A Adverse effect of carbonic-anhydrase inhibitors, benzothiadiazides and other diuretics, initial encounter: Secondary | ICD-10-CM | POA: Diagnosis not present

## 2015-11-04 DIAGNOSIS — K219 Gastro-esophageal reflux disease without esophagitis: Secondary | ICD-10-CM | POA: Diagnosis present

## 2015-11-04 DIAGNOSIS — R000 Tachycardia, unspecified: Secondary | ICD-10-CM | POA: Diagnosis present

## 2015-11-04 DIAGNOSIS — I252 Old myocardial infarction: Secondary | ICD-10-CM | POA: Diagnosis not present

## 2015-11-04 DIAGNOSIS — R0602 Shortness of breath: Secondary | ICD-10-CM | POA: Diagnosis present

## 2015-11-04 LAB — BASIC METABOLIC PANEL
ANION GAP: 6 (ref 5–15)
BUN: 30 mg/dL — ABNORMAL HIGH (ref 6–20)
CALCIUM: 9.3 mg/dL (ref 8.9–10.3)
CHLORIDE: 99 mmol/L — AB (ref 101–111)
CO2: 29 mmol/L (ref 22–32)
Creatinine, Ser: 1.75 mg/dL — ABNORMAL HIGH (ref 0.44–1.00)
GFR calc Af Amer: 33 mL/min — ABNORMAL LOW (ref >60)
GFR calc non Af Amer: 28 mL/min — ABNORMAL LOW (ref >60)
GLUCOSE: 131 mg/dL — AB (ref 65–99)
POTASSIUM: 4.2 mmol/L (ref 3.5–5.1)
Sodium: 134 mmol/L — ABNORMAL LOW (ref 135–145)

## 2015-11-04 LAB — CBC
HEMATOCRIT: 27.3 % — AB (ref 36.0–46.0)
HEMOGLOBIN: 8.4 g/dL — AB (ref 12.0–15.0)
MCH: 28.4 pg (ref 26.0–34.0)
MCHC: 30.8 g/dL (ref 30.0–36.0)
MCV: 92.2 fL (ref 78.0–100.0)
Platelets: 231 10*3/uL (ref 150–400)
RBC: 2.96 MIL/uL — ABNORMAL LOW (ref 3.87–5.11)
RDW: 14.6 % (ref 11.5–15.5)
WBC: 9 10*3/uL (ref 4.0–10.5)

## 2015-11-04 MED ORDER — METHYLPREDNISOLONE SODIUM SUCC 125 MG IJ SOLR
60.0000 mg | Freq: Two times a day (BID) | INTRAMUSCULAR | Status: DC
Start: 2015-11-04 — End: 2015-11-05
  Administered 2015-11-04 – 2015-11-05 (×3): 60 mg via INTRAVENOUS
  Filled 2015-11-04 (×3): qty 2

## 2015-11-04 MED ORDER — METOPROLOL TARTRATE 25 MG PO TABS
25.0000 mg | ORAL_TABLET | Freq: Two times a day (BID) | ORAL | Status: DC
  Administered 2015-11-04 – 2015-11-08 (×8): 25 mg via ORAL
  Filled 2015-11-04 (×8): qty 1

## 2015-11-04 NOTE — Progress Notes (Signed)
BP 105/45. Metoprolol 25 mg due. MD notified and ordered to hold medication. Lesly Dukes, RN

## 2015-11-04 NOTE — Progress Notes (Signed)
PROGRESS NOTE    Sheila Pierce  NWG:[id number] DOB: 11/08/1945 DOA: 11/03/2015 PCP: No PCP Per Patient    Brief Narrative: Sheila BruinsWendy M Pierce is a 70 y.o. female with medical history significant of chronic respiratory failure from COPD;  H/o NSTEMI; HTN; GERD; and anxiety presents with sob and persistent cough and wheezing in  2 days.   Assessment & Plan:   Principal Problem:   Acute exacerbation of chronic obstructive pulmonary disease (COPD) (HCC) Active Problems:   Hypertension   Hyponatremia   AKI (acute kidney injury) (HCC)   Anemia   Anxiety   Acute COPD exacerbation: Improving,  Resume IV solumedrol and continue the duonebs.  [place] oxygen as needed to keep sats greater than 90%.    Anxiety: Ativan prn.   Hypertension: well controlled today. Hypotension resolved.    Hyponatremia: Stable at 134.    Acute renal failure : Suspect from dehydration, hypotension.  Hold nephrotoxic agents.  Improving with gentle hydration.  Continue to monitor.     DVT prophylaxis: lovenox.  Code Status: DNR Family Communication: none at bedside.  Disposition Plan: pending further evaluation.    Consultants:   None.    Procedures: none   Antimicrobials: none   Subjective: Breathing better.   Objective: Vitals:   11/04/15 0500 11/04/15 0726 11/04/15 1009 11/04/15 1532  BP: (!) 101/58  (!) 105/45 (!) 109/55  Pulse: 60  (!) 103 (!) 106  Resp: (!) 24   18  Temp: 98.5 F (36.9 C)   98.4 F (36.9 C)  TempSrc: Oral   Oral  SpO2: 100% 100%  95%  Weight:      Height:        Intake/Output Summary (Last 24 hours) at 11/04/15 1800 Last data filed at 11/04/15 1740  Gross per 24 hour  Intake              360 ml  Output              200 ml  Net              160 ml   Filed Weights   11/03/15 1347 11/03/15 2136  Weight: 63.5 kg (140 lb) 63.3 kg (139 lb 9.6 oz)    Examination:  General exam: Appears calm and comfortable  Respiratory system:  Bilateral scattered  wheeze heard.  Cardiovascular system: S1 & S2 heard, RRR. No JVD, murmurs, rubs, gallops or clicks. No pedal edema. Gastrointestinal system: Abdomen is nondistended, soft and nontender. No organomegaly or masses felt. Normal bowel sounds heard. Central nervous system: Alert and oriented. No focal neurological deficits. Extremities: Symmetric 5 x 5 power. Skin: No rashes, lesions or ulcers     Data Reviewed: I have personally reviewed following labs and imaging studies  CBC:  Recent Labs Lab 11/03/15 1456 11/04/15 0647  WBC 8.6 9.0  NEUTROABS 6.5  --   HGB 9.8* 8.4*  HCT 32.5* 27.3*  MCV 95.3 92.2  PLT 262 231   Basic Metabolic Panel:  Recent Labs Lab 11/03/15 1456 11/04/15 0647  NA 134* 134*  K 3.7 4.2  CL 97* 99*  CO2 30 29  GLUCOSE 132* 131*  BUN 20 30*  CREATININE 1.87* 1.75*  CALCIUM 9.6 9.3   GFR: Estimated Creatinine Clearance: 24.5 mL/min (by C-G formula based on SCr of 1.75 mg/dL (H)). Liver Function Tests: No results for input(s): AST, ALT, ALKPHOS, BILITOT, PROT, ALBUMIN in the last 168 hours. No results for input(s): LIPASE, AMYLASE in  the last 168 hours. No results for input(s): AMMONIA in the last 168 hours. Coagulation Profile: No results for input(s): INR, PROTIME in the last 168 hours. Cardiac Enzymes:  Recent Labs Lab 11/03/15 1456  TROPONINI <0.03   BNP (last 3 results) No results for input(s): PROBNP in the last 8760 hours. HbA1C: No results for input(s): HGBA1C in the last 72 hours. CBG: No results for input(s): GLUCAP in the last 168 hours. Lipid Profile: No results for input(s): CHOL, HDL, LDLCALC, TRIG, CHOLHDL, LDLDIRECT in the last 72 hours. Thyroid Function Tests: No results for input(s): TSH, T4TOTAL, FREET4, T3FREE, THYROIDAB in the last 72 hours. Anemia Panel: No results for input(s): VITAMINB12, FOLATE, FERRITIN, TIBC, IRON, RETICCTPCT in the last 72 hours. Sepsis Labs: No results for input(s): PROCALCITON, LATICACIDVEN  in the last 168 hours.  No results found for this or any previous visit (from the past 240 hour(s)).       Radiology Studies: Dg Chest Port 1 View  Result Date: 11/03/2015 CLINICAL DATA:  Shortness of breath. EXAM: PORTABLE CHEST 1 VIEW COMPARISON:  Radiograph of October 20, 2015. FINDINGS: The heart size and mediastinal contours are within normal limits. Both lungs are clear. Atherosclerosis of thoracic aorta is noted. No pneumothorax or pleural effusion is noted. The visualized skeletal structures are unremarkable. IMPRESSION: Aortic atherosclerosis.  No acute cardiopulmonary abnormality seen. Electronically Signed   By: Lupita Raider, M.D.   On: 11/03/2015 14:25        Scheduled Meds: . acetaZOLAMIDE  250 mg Oral BID  . aspirin  81 mg Oral Daily  . budesonide (PULMICORT) nebulizer solution  0.25 mg Nebulization BID  . busPIRone  15 mg Oral BID  . docusate sodium  100 mg Oral BID  . enoxaparin (LOVENOX) injection  30 mg Subcutaneous Q24H  . fluticasone furoate-vilanterol  1 puff Inhalation Daily  . methylPREDNISolone (SOLU-MEDROL) injection  60 mg Intravenous Q12H  . metoprolol tartrate  25 mg Oral BID  . sodium chloride flush  3 mL Intravenous Q12H   Continuous Infusions:    LOS: 0 days    Time spent: 30 minutes.    Kathlen Mody, MD [facility] Pager [phone]  If 7PM-7AM, please contact night-coverage [email or web address] Password [email or web address] 11/04/2015, 6:00 PM

## 2015-11-04 NOTE — Care Management Obs Status (Signed)
MEDICARE OBSERVATION STATUS NOTIFICATION   Patient Details  Name: MERINDA DEEKEN MRN: [id number] Date of Birth: 21-Oct-1945   Medicare Observation Status Notification Given:  Yes    Malcolm Metro, RN 11/04/2015, 10:59 AM

## 2015-11-04 NOTE — Care Management Note (Signed)
Case Management Note  Patient Details  Name: Sheila Pierce MRN: [id number] Date of Birth: 1945-08-09  Subjective/Objective:                  Pt admitted with COPD exacerbation. She is from home, lives with family and is ind with ADL's. She uses a walker for mobility. She has neb and home oxygen. She has aid that comes regularly. She plans to return home with self care at DC.   Action/Plan: No CM needs anticipated.   Expected Discharge Date:     11/04/2015             Expected Discharge Plan:  Home/Self Care  In-House Referral:  NA  Discharge planning Services  CM Consult  Post Acute Care Choice:  NA Choice offered to:  NA  DME Arranged:    DME Agency:     HH Arranged:    HH Agency:     Status of Service: completed.   If discussed at Long Length of Stay Meetings, dates discussed:    Additional Comments:  Malcolm Metro, RN 11/04/2015, 11:00 AM

## 2015-11-04 NOTE — Clinical Social Work Note (Signed)
CSW received referral for PCP. CM notified. CSW will sign off, but can be reconsulted if needed.  Derenda Fennel, LCSW [phone]

## 2015-11-05 DIAGNOSIS — I10 Essential (primary) hypertension: Secondary | ICD-10-CM

## 2015-11-05 DIAGNOSIS — J441 Chronic obstructive pulmonary disease with (acute) exacerbation: Principal | ICD-10-CM

## 2015-11-05 DIAGNOSIS — N179 Acute kidney failure, unspecified: Secondary | ICD-10-CM

## 2015-11-05 DIAGNOSIS — E119 Type 2 diabetes mellitus without complications: Secondary | ICD-10-CM

## 2015-11-05 DIAGNOSIS — D649 Anemia, unspecified: Secondary | ICD-10-CM

## 2015-11-05 LAB — GLUCOSE, CAPILLARY
GLUCOSE-CAPILLARY: 153 mg/dL — AB (ref 65–99)
GLUCOSE-CAPILLARY: 140 mg/dL — AB (ref 65–99)

## 2015-11-05 LAB — IRON AND TIBC
IRON: 79 ug/dL (ref 28–170)
Saturation Ratios: 24 % (ref 10.4–31.8)
TIBC: 323 ug/dL (ref 250–450)
UIBC: 244 ug/dL

## 2015-11-05 LAB — VITAMIN B12: Vitamin B-12: 978 pg/mL — ABNORMAL HIGH (ref 180–914)

## 2015-11-05 LAB — FOLATE: Folate: 22.7 ng/mL (ref >5.9)

## 2015-11-05 LAB — TSH: TSH: 0.169 u[IU]/mL — AB (ref 0.350–4.500)

## 2015-11-05 LAB — FERRITIN: FERRITIN: 86 ng/mL (ref 11–307)

## 2015-11-05 MED ORDER — INSULIN ASPART 100 UNIT/ML ~~LOC~~ SOLN: 0.0000 [IU] | Freq: Every day | SUBCUTANEOUS | Status: DC

## 2015-11-05 MED ORDER — METHYLPREDNISOLONE SODIUM SUCC 125 MG IJ SOLR
60.0000 mg | Freq: Three times a day (TID) | INTRAMUSCULAR | Status: DC
  Administered 2015-11-05 – 2015-11-08 (×10): 60 mg via INTRAVENOUS
  Filled 2015-11-05 (×10): qty 2

## 2015-11-05 MED ORDER — SODIUM CHLORIDE 0.9 % IV SOLN
INTRAVENOUS | Status: DC
  Administered 2015-11-05 – 2015-11-07 (×3): via INTRAVENOUS

## 2015-11-05 MED ORDER — INSULIN ASPART 100 UNIT/ML ~~LOC~~ SOLN
0.0000 [IU] | Freq: Three times a day (TID) | SUBCUTANEOUS | Status: DC
  Administered 2015-11-05 – 2015-11-06 (×2): 3 [IU] via SUBCUTANEOUS
  Administered 2015-11-06: 2 [IU] via SUBCUTANEOUS
  Administered 2015-11-06: 3 [IU] via SUBCUTANEOUS
  Administered 2015-11-07 – 2015-11-08 (×2): 5 [IU] via SUBCUTANEOUS

## 2015-11-05 NOTE — Care Management (Signed)
New PCP established with Dr. [name]. New pt appointment added to AVS.

## 2015-11-05 NOTE — Progress Notes (Signed)
PROGRESS NOTE    Sheila Pierce  VHQ:[id number] DOB: 03-26-1945 DOA: 11/03/2015 PCP: No PCP Per Patient    Brief Narrative:  Patient is a 70 year old with a history of COPD causing chronic hypoxia respiratory failure, CAD, HTN, GERD, chronic anxiety, who presented to the ED on 11/03/2015 with shortness of breath. In the ED, she was afebrile, tachycardic, and with a low-normal blood pressure. Her troponin I was negative. Her chest x-ray revealed no acute cardiopulmonary abnormality seen. Her EKG revealed sinus tachycardia. She was admitted for further evaluation and management.   Assessment & Plan:   Principal Problem:   Acute exacerbation of chronic obstructive pulmonary disease (COPD) (HCC) Active Problems:   Essential hypertension   Hyponatremia   AKI (acute kidney injury) (HCC)   Normocytic anemia   Anxiety   COPD exacerbation (HCC)   Diabetes mellitus type II, controlled (HCC)   1. COPD with exacerbation Patient is treated chronically with DuoNeb and Breo inhaler and when necessary albuterol inhaler. Patient was started on IV Solu-Medrol, Breo Ellipta and Pulmicort nebulizer. Antibiotic was apparently not started on admission. Will consider starting one if she decompensates. -Due to the persistent wheezes, will increase Solu-Medrol to 60 mg every 8 hours; discontinue the Breo, start Spiriva, and start Xopenex nebulizer.  Acute kidney injury. Patient's creatinine was 1.04 approximately 3 weeks ago. It was 1.87 on admission. Data she was given IV fluids in the ED with slight improvement in her creatinine. -We'll restart IV fluids as the etiology may be  prerenal azotemia.  Essential hypertension. The patient is treated chronically with lisinopril/HCTZ and metoprolol. Metoprolol was continued, but lisinopril/HCTZ was withheld due to AKI  Chronic anxiety. The patient is treated chronically with BuSpar.  Type II insulin requiring diabetes mellitus vs Prediabetes. Patient is  treated chronically with sliding scale Humulin R. -Will start sliding-scale NovoLog and order hemoglobin A1c.  Normocytic anemia. Patient's hemoglobin was 10.13 weeks ago and 9.8 on admission. It has fallen to 8.4. The patient denies bright red blood per rectum or black tarry stools. -We'll order an anemia panel, TSH, and Hemoccult of her stools.  Sinus tachycardia. Etiology likely from bronchodilators. Will order a TSH for further evaluation.   DVT prophylaxis: Lovenox Code Status: DO NOT RESUSCITATE Family Communication: Family not available Disposition Plan: Discharged home clinically appropriate.   Consultants:   None  Procedures:   None  Antimicrobials:  None    Subjective: Patient says that she is breathing a little better.  Objective: Vitals:   11/04/15 1949 11/04/15 2219 11/05/15 0734 11/05/15 0739  BP:  (!) 128/54    Pulse:  (!) 110    Resp:  15    Temp:  98.7 F (37.1 C)    TempSrc:  Oral    SpO2: 93% 98% 98% 98%  Weight:      Height:        Intake/Output Summary (Last 24 hours) at 11/05/15 1302 Last data filed at 11/04/15 1740  Gross per 24 hour  Intake              120 ml  Output                0 ml  Net              120 ml   Filed Weights   11/03/15 1347 11/03/15 2136  Weight: 63.5 kg (140 lb) 63.3 kg (139 lb 9.6 oz)    Examination:  General exam: Appears calm and comfortable  Respiratory system: Fine scattered wheezes. Respiratory effort normal. Cardiovascular system: S1 & S2 with tachycardia. No pedal edema. Gastrointestinal system: Abdomen is nondistended, soft and nontender. No organomegaly or masses felt. Normal bowel sounds heard. Central nervous system: Alert and oriented. No focal neurological deficits. Extremities: Symmetric 5 x 5 power. Skin: No rashes, lesions or ulcers Psychiatry: Judgement and insight appear normal. Mood & affect appropriate.     Data Reviewed: I have personally reviewed following labs and imaging  studies  CBC:  Recent Labs Lab 11/03/15 1456 11/04/15 0647  WBC 8.6 9.0  NEUTROABS 6.5  --   HGB 9.8* 8.4*  HCT 32.5* 27.3*  MCV 95.3 92.2  PLT 262 231   Basic Metabolic Panel:  Recent Labs Lab 11/03/15 1456 11/04/15 0647  NA 134* 134*  K 3.7 4.2  CL 97* 99*  CO2 30 29  GLUCOSE 132* 131*  BUN 20 30*  CREATININE 1.87* 1.75*  CALCIUM 9.6 9.3   GFR: Estimated Creatinine Clearance: 24.5 mL/min (by C-G formula based on SCr of 1.75 mg/dL (H)). Liver Function Tests: No results for input(s): AST, ALT, ALKPHOS, BILITOT, PROT, ALBUMIN in the last 168 hours. No results for input(s): LIPASE, AMYLASE in the last 168 hours. No results for input(s): AMMONIA in the last 168 hours. Coagulation Profile: No results for input(s): INR, PROTIME in the last 168 hours. Cardiac Enzymes:  Recent Labs Lab 11/03/15 1456  TROPONINI <0.03   BNP (last 3 results) No results for input(s): PROBNP in the last 8760 hours. HbA1C: No results for input(s): HGBA1C in the last 72 hours. CBG: No results for input(s): GLUCAP in the last 168 hours. Lipid Profile: No results for input(s): CHOL, HDL, LDLCALC, TRIG, CHOLHDL, LDLDIRECT in the last 72 hours. Thyroid Function Tests: No results for input(s): TSH, T4TOTAL, FREET4, T3FREE, THYROIDAB in the last 72 hours. Anemia Panel: No results for input(s): VITAMINB12, FOLATE, FERRITIN, TIBC, IRON, RETICCTPCT in the last 72 hours. Sepsis Labs: No results for input(s): PROCALCITON, LATICACIDVEN in the last 168 hours.  No results found for this or any previous visit (from the past 240 hour(s)).       Radiology Studies: Dg Chest Port 1 View  Result Date: 11/03/2015 CLINICAL DATA:  Shortness of breath. EXAM: PORTABLE CHEST 1 VIEW COMPARISON:  Radiograph of October 20, 2015. FINDINGS: The heart size and mediastinal contours are within normal limits. Both lungs are clear. Atherosclerosis of thoracic aorta is noted. No pneumothorax or pleural effusion  is noted. The visualized skeletal structures are unremarkable. IMPRESSION: Aortic atherosclerosis.  No acute cardiopulmonary abnormality seen. Electronically Signed   By: Lupita RaiderJames  Green Jr, M.D.   On: 11/03/2015 14:25        Scheduled Meds: . acetaZOLAMIDE  250 mg Oral BID  . aspirin  81 mg Oral Daily  . budesonide (PULMICORT) nebulizer solution  0.25 mg Nebulization BID  . busPIRone  15 mg Oral BID  . docusate sodium  100 mg Oral BID  . enoxaparin (LOVENOX) injection  30 mg Subcutaneous Q24H  . fluticasone furoate-vilanterol  1 puff Inhalation Daily  . methylPREDNISolone (SOLU-MEDROL) injection  60 mg Intravenous Q12H  . metoprolol tartrate  25 mg Oral BID  . sodium chloride flush  3 mL Intravenous Q12H   Continuous Infusions:    LOS: 1 day    Time spent: 30 minutes    Elliot CousinFISHER,Eudelia Hiltunen, MD [facility] Pager [phone]  If 7PM-7AM, please contact night-coverage [email or web address] Password [email or web address] 11/05/2015, 1:02 PM

## 2015-11-06 DIAGNOSIS — R946 Abnormal results of thyroid function studies: Secondary | ICD-10-CM

## 2015-11-06 DIAGNOSIS — E871 Hypo-osmolality and hyponatremia: Secondary | ICD-10-CM

## 2015-11-06 LAB — CBC
HEMATOCRIT: 31.1 % — AB (ref 36.0–46.0)
Hemoglobin: 9.3 g/dL — ABNORMAL LOW (ref 12.0–15.0)
MCH: 28.7 pg (ref 26.0–34.0)
MCHC: 29.9 g/dL — AB (ref 30.0–36.0)
MCV: 96 fL (ref 78.0–100.0)
PLATELETS: 298 10*3/uL (ref 150–400)
RBC: 3.24 MIL/uL — ABNORMAL LOW (ref 3.87–5.11)
RDW: 14.7 % (ref 11.5–15.5)
WBC: 9.6 10*3/uL (ref 4.0–10.5)

## 2015-11-06 LAB — BASIC METABOLIC PANEL
Anion gap: 5 (ref 5–15)
BUN: 36 mg/dL — AB (ref 6–20)
CALCIUM: 8.9 mg/dL (ref 8.9–10.3)
CO2: 29 mmol/L (ref 22–32)
Chloride: 100 mmol/L — ABNORMAL LOW (ref 101–111)
Creatinine, Ser: 0.95 mg/dL (ref 0.44–1.00)
GFR calc Af Amer: >60 mL/min (ref >60)
GFR, EST NON AFRICAN AMERICAN: 59 mL/min — AB (ref >60)
GLUCOSE: 152 mg/dL — AB (ref 65–99)
POTASSIUM: 3.8 mmol/L (ref 3.5–5.1)
SODIUM: 134 mmol/L — AB (ref 135–145)

## 2015-11-06 LAB — GLUCOSE, CAPILLARY
GLUCOSE-CAPILLARY: 151 mg/dL — AB (ref 65–99)
Glucose-Capillary: 138 mg/dL — ABNORMAL HIGH (ref 65–99)
Glucose-Capillary: 174 mg/dL — ABNORMAL HIGH (ref 65–99)
Glucose-Capillary: 178 mg/dL — ABNORMAL HIGH (ref 65–99)

## 2015-11-06 LAB — HEMOGLOBIN A1C
Hgb A1c MFr Bld: 5.3 % (ref 4.8–5.6)
MEAN PLASMA GLUCOSE: 105 mg/dL

## 2015-11-06 LAB — T4, FREE: FREE T4: 1.06 ng/dL (ref 0.61–1.12)

## 2015-11-06 MED ORDER — TIOTROPIUM BROMIDE MONOHYDRATE 18 MCG IN CAPS
18.0000 ug | ORAL_CAPSULE | Freq: Every day | RESPIRATORY_TRACT | Status: DC
  Administered 2015-11-06 – 2015-11-08 (×3): 18 ug via RESPIRATORY_TRACT
  Filled 2015-11-06: qty 5

## 2015-11-06 MED ORDER — GUAIFENESIN-DM 100-10 MG/5ML PO SYRP
5.0000 mL | ORAL_SOLUTION | Freq: Two times a day (BID) | ORAL | Status: DC
  Administered 2015-11-06 – 2015-11-08 (×5): 5 mL via ORAL
  Filled 2015-11-06 (×5): qty 5

## 2015-11-06 NOTE — Progress Notes (Addendum)
PROGRESS NOTE    Sheila Pierce  ZWC:[id number] DOB: 31-Aug-1945 DOA: 11/03/2015 PCP: No PCP Per Patient    Brief Narrative:  Patient is a 70 year old with a history of COPD causing chronic hypoxia respiratory failure, CAD, HTN, GERD, chronic anxiety, who presented to the ED on 11/03/2015 with shortness of breath. In the ED, she was afebrile, tachycardic, and with a low-normal blood pressure. Her troponin I was negative. Her chest x-ray revealed no acute cardiopulmonary abnormality seen. Her EKG revealed sinus tachycardia. She was admitted for further evaluation and management.   Assessment & Plan:   Principal Problem:   Acute exacerbation of chronic obstructive pulmonary disease (COPD) (HCC) Active Problems:   Essential hypertension   Hyponatremia   AKI (acute kidney injury) (HCC)   Normocytic anemia   Anxiety   COPD exacerbation (HCC)   Diabetes mellitus type II, controlled (HCC)   Abnormal thyroid function test   1. COPD with exacerbation Patient is treated chronically with DuoNeb and Breo inhaler and when necessary albuterol inhaler. Patient was started on IV Solu-Medrol, Breo Ellipta and Pulmicort nebulizer. Antibiotic was apparently not started on admission.  -Due to the persistent wheezes, Solu-Medrol was increased to 60 mg every 8 hours. Xopenex was going to be added, but it was discontinued as it can cause prolonged QT interval in the patient with a history of it.  -We'll start Spiriva. Due to penicillin allergy and other antibiotics causing potential prolonged QT interval, will hold off on starting antibiotics for now. We'll start Robitussin-DM for her cough.  Acute kidney injury. Patient's creatinine was 1.04 approximately 3 weeks ago. It was 1.87 on admission. Data she was given IV fluids in the ED with slight improvement in her creatinine. -Gentle IV fluids were started on 11/05/15. Follow-up creatinine is pending.  -We'll decrease the IV fluid rate due some pulmonary  congestion.  Essential hypertension. The patient is treated chronically with lisinopril/HCTZ and metoprolol. Metoprolol was continued, but lisinopril/HCTZ was withheld due to AKI. Her blood pressure has been stable. Follow-up creatinine is pending.   Chronic anxiety. The patient is treated chronically with BuSpar. It was continued.  Type II insulin requiring diabetes mellitus vs Prediabetes. Patient is treated chronically with sliding scale Humulin R. -Sliding scale NovoLog was started. Her CBGs have been reasonable. Her A1c was 5.3.  Normocytic anemia. Patient's hemoglobin was 10.13 weeks ago and 9.8 on admission. It had fallen to 8.4. The patient denies bright red blood per rectum or black tarry stools. -Anemia panel ordered for evaluation and was virtually unremarkable. Hemoccult of her stools is pending.  Sinus tachycardia.  Patient's heart rate has been intermittently elevated, likely from bronchodilators. However, TSH was ordered and was found to be low, suspicious for hyperthyroidism. -Her heart rate has improved.  Low TSH.  Patient's TSH is low at 0.169. We'll order free T4 for further evaluation.   DVT prophylaxis: Lovenox Code Status: DO NOT RESUSCITATE Family Communication: Family not available Disposition Plan: Discharged home clinically appropriate.   Consultants:   None  Procedures:   None  Antimicrobials:  None   Subjective: Patient complains of a cough, but she does not feel more congested after the IV fluids were started.  Objective: Vitals:   11/05/15 2130 11/06/15 0529 11/06/15 0816 11/06/15 0819  BP: (!) 133/47 (!) 99/41 128/69   Pulse: 88 73 90   Resp: 18 18    Temp: 98.3 F (36.8 C) 97.7 F (36.5 C)    TempSrc: Oral Oral  SpO2: 95% 100% 93% 94%  Weight:      Height:        Intake/Output Summary (Last 24 hours) at 11/06/15 1157 Last data filed at 11/06/15 0413  Gross per 24 hour  Intake             1135 ml  Output               200 ml  Net              935 ml   Filed Weights   11/03/15 1347 11/03/15 2136  Weight: 63.5 kg (140 lb) 63.3 kg (139 lb 9.6 oz)    Examination:  General exam: Appears calm and comfortable  Respiratory system: Fine scattered wheezes with added occasional crackles. Respiratory effort normal. Cardiovascular system: S1 & S2 with tachycardia. No pedal edema. Gastrointestinal system: Abdomen is nondistended, soft and nontender. No organomegaly or masses felt. Normal bowel sounds heard. Central nervous system: Alert and oriented. No focal neurological deficits. Extremities: Symmetric 5 x 5 power. Skin: No rashes, lesions or ulcers Psychiatry: Judgement and insight appear normal. Mood & affect appropriate.     Data Reviewed: I have personally reviewed following labs and imaging studies  CBC:  Recent Labs Lab 11/03/15 1456 11/04/15 0647  WBC 8.6 9.0  NEUTROABS 6.5  --   HGB 9.8* 8.4*  HCT 32.5* 27.3*  MCV 95.3 92.2  PLT 262 231   Basic Metabolic Panel:  Recent Labs Lab 11/03/15 1456 11/04/15 0647  NA 134* 134*  K 3.7 4.2  CL 97* 99*  CO2 30 29  GLUCOSE 132* 131*  BUN 20 30*  CREATININE 1.87* 1.75*  CALCIUM 9.6 9.3   GFR: Estimated Creatinine Clearance: 24.5 mL/min (by C-G formula based on SCr of 1.75 mg/dL (H)). Liver Function Tests: No results for input(s): AST, ALT, ALKPHOS, BILITOT, PROT, ALBUMIN in the last 168 hours. No results for input(s): LIPASE, AMYLASE in the last 168 hours. No results for input(s): AMMONIA in the last 168 hours. Coagulation Profile: No results for input(s): INR, PROTIME in the last 168 hours. Cardiac Enzymes:  Recent Labs Lab 11/03/15 1456  TROPONINI <0.03   BNP (last 3 results) No results for input(s): PROBNP in the last 8760 hours. HbA1C:  Recent Labs  11/05/15 1327  HGBA1C 5.3   CBG:  Recent Labs Lab 11/05/15 1644 11/05/15 2140 11/06/15 0807 11/06/15 1124  GLUCAP 153* 140* 138* 151*   Lipid Profile: No results  for input(s): CHOL, HDL, LDLCALC, TRIG, CHOLHDL, LDLDIRECT in the last 72 hours. Thyroid Function Tests:  Recent Labs  11/05/15 1327  TSH 0.169*   Anemia Panel:  Recent Labs  11/05/15 1327  VITAMINB12 978*  FOLATE 22.7  FERRITIN 86  TIBC 323  IRON 79   Sepsis Labs: No results for input(s): PROCALCITON, LATICACIDVEN in the last 168 hours.  No results found for this or any previous visit (from the past 240 hour(s)).       Radiology Studies: No results found.      Scheduled Meds: . acetaZOLAMIDE  250 mg Oral BID  . aspirin  81 mg Oral Daily  . budesonide (PULMICORT) nebulizer solution  0.25 mg Nebulization BID  . busPIRone  15 mg Oral BID  . docusate sodium  100 mg Oral BID  . enoxaparin (LOVENOX) injection  30 mg Subcutaneous Q24H  . fluticasone furoate-vilanterol  1 puff Inhalation Daily  . insulin aspart  0-15 Units Subcutaneous TID WC  . insulin aspart  0-5 Units Subcutaneous QHS  . methylPREDNISolone (SOLU-MEDROL) injection  60 mg Intravenous Q8H  . metoprolol tartrate  25 mg Oral BID  . sodium chloride flush  3 mL Intravenous Q12H   Continuous Infusions: . sodium chloride 75 mL/hr at 11/06/15 0351     LOS: 2 days    Time spent: 30 minutes    Elliot CousinFISHER,Meria Crilly, MD [facility] Pager [phone]  If 7PM-7AM, please contact night-coverage [email or web address] Password [email or web address] 11/06/2015, 11:57 AM

## 2015-11-06 NOTE — Care Management Important Message (Signed)
Important Message  Patient Details  Name: JAMILLA SHINGLEDECKER MRN: [id number] Date of Birth: [birth year]   Medicare Important Message Given:  Yes    Malcolm Metro, RN 11/06/2015, 2:28 PM

## 2015-11-07 ENCOUNTER — Inpatient Hospital Stay (HOSPITAL_COMMUNITY): Payer: Medicare HMO

## 2015-11-07 LAB — GLUCOSE, CAPILLARY
GLUCOSE-CAPILLARY: 224 mg/dL — AB (ref 65–99)
Glucose-Capillary: 115 mg/dL — ABNORMAL HIGH (ref 65–99)
Glucose-Capillary: 91 mg/dL (ref 65–99)
Glucose-Capillary: 131 mg/dL — ABNORMAL HIGH (ref 65–99)

## 2015-11-07 LAB — BASIC METABOLIC PANEL
Anion gap: 5 (ref 5–15)
BUN: 31 mg/dL — AB (ref 6–20)
CHLORIDE: 105 mmol/L (ref 101–111)
CO2: 28 mmol/L (ref 22–32)
Calcium: 9.4 mg/dL (ref 8.9–10.3)
Creatinine, Ser: 0.94 mg/dL (ref 0.44–1.00)
GFR calc non Af Amer: 60 mL/min — ABNORMAL LOW (ref >60)
Glucose, Bld: 109 mg/dL — ABNORMAL HIGH (ref 65–99)
POTASSIUM: 3.8 mmol/L (ref 3.5–5.1)
SODIUM: 138 mmol/L (ref 135–145)

## 2015-11-07 LAB — CBC
HEMATOCRIT: 31.9 % — AB (ref 36.0–46.0)
Hemoglobin: 9.8 g/dL — ABNORMAL LOW (ref 12.0–15.0)
MCH: 28.7 pg (ref 26.0–34.0)
MCHC: 30.7 g/dL (ref 30.0–36.0)
MCV: 93.5 fL (ref 78.0–100.0)
Platelets: 258 10*3/uL (ref 150–400)
RBC: 3.41 MIL/uL — AB (ref 3.87–5.11)
RDW: 14.6 % (ref 11.5–15.5)
WBC: 6.7 10*3/uL (ref 4.0–10.5)

## 2015-11-07 NOTE — Progress Notes (Signed)
PROGRESS NOTE    Sheila Pierce  [id number] DOB: 11/01/1945 DOA: 11/03/2015 PCP: No PCP Per Patient    Brief Narrative:  Patient is a 70 year old with a history of COPD causing chronic hypoxia respiratory failure, CAD, HTN, GERD, chronic anxiety, who presented to the ED on 11/03/2015 with shortness of breath. In the ED, she was afebrile, tachycardic, and with a low-normal blood pressure. Her troponin I was negative. Her chest x-ray revealed no acute cardiopulmonary abnormality seen. Her EKG revealed sinus tachycardia. She was admitted for further evaluation and management.   Assessment & Plan:   Principal Problem:   Acute exacerbation of chronic obstructive pulmonary disease (COPD) (HCC) Active Problems:   Essential hypertension   Hyponatremia   AKI (acute kidney injury) (HCC)   Normocytic anemia   Anxiety   COPD exacerbation (HCC)   Diabetes mellitus type II, controlled (HCC)   Abnormal thyroid function test   1. COPD with exacerbation Patient is treated chronically with DuoNeb and Breo inhaler and when necessary albuterol inhaler. Patient was started on IV Solu-Medrol, Breo Ellipta and Pulmicort nebulizer. Antibiotic was apparently not started on admission.  -Due to the persistent wheezes, Solu-Medrol was increased to 60 mg every 8 hours. Xopenex was going to be added, but it was discontinued as it can cause prolonged QT interval in the patient with a history of it.  -Spiriva was started. Robitussin-DM was started to help with her cough. -Due to penicillin allergy and other antibiotics causing potential prolonged QT interval, will hold off on starting antibiotics for now. -We'll order a chest x-ray for follow-up evaluation. She appears to be improving slowly.  Acute kidney injury. Patient's creatinine was 1.04 approximately 3 weeks ago. It was 1.87 on admission. Data she was given IV fluids in the ED with slight improvement in her creatinine. -Gentle IV fluids were started on  11/05/15. Follow-up creatinine has improved and normalized. -IV fluids have been decreased.  Essential hypertension. The patient is treated chronically with lisinopril/HCTZ and metoprolol. Metoprolol was continued, but lisinopril/HCTZ was withheld due to AKI. Her blood pressure has been stable. .   Chronic anxiety. The patient is treated chronically with BuSpar. It was continued.  Type II insulin requiring diabetes mellitus vs Prediabetes. Patient is treated chronically with sliding scale Humulin R. -Sliding scale NovoLog was started. Her CBGs have been reasonable. Her A1c was 5.3.  Normocytic anemia. Patient's hemoglobin was 10.13 weeks ago and 9.8 on admission. It had fallen to 8.4. The patient denies bright red blood per rectum or black tarry stools. -Anemia panel ordered for evaluation and was virtually unremarkable. Hemoccult of her stools is pending.  Sinus tachycardia.  Patient's heart rate has been intermittently elevated, likely from bronchodilators. However, TSH was ordered and was found to be low, suspicious for hyperthyroidism. -Her heart rate has improved.  Low TSH. Patient's TSH is low at 0.169, however, her free T4 was within normal limits at 1.06. ? Sick euthyroid syndrome. Patient will need chronic monitoring of her thyroid function and possible referral to endocrinology.    DVT prophylaxis: Lovenox Code Status: DO NOT RESUSCITATE Family Communication: Family not available Disposition Plan: Discharged home clinically appropriate, likely in the next 24 hours.   Consultants:   None  Procedures:   None  Antimicrobials:  None   Subjective: Patient believes she is less congested.  Objective: Vitals:   11/06/15 2109 11/07/15 0519 11/07/15 0924 11/07/15 1001  BP: (!) 159/74 (!) 114/49    Pulse: (!) 102 82  Resp: 20 18    Temp: 98.3 F (36.8 C) 97.8 F (36.6 C)    TempSrc: Oral Oral    SpO2: 97% 98% 97% 92%  Weight:      Height:         Intake/Output Summary (Last 24 hours) at 11/07/15 1313 Last data filed at 11/07/15 1100  Gross per 24 hour  Intake              360 ml  Output                1 ml  Net              359 ml   Filed Weights   11/03/15 1347 11/03/15 2136  Weight: 63.5 kg (140 lb) 63.3 kg (139 lb 9.6 oz)    Examination:  General exam: Appears calm and comfortable  Respiratory system: She is fine wheezes compared to yesterday. Respiratory effort normal. Cardiovascular system: S1 & S2 with tachycardia. No pedal edema. Gastrointestinal system: Abdomen is nondistended, soft and nontender. No organomegaly or masses felt. Normal bowel sounds heard. Central nervous system: Alert and oriented. No focal neurological deficits. Extremities: Symmetric 5 x 5 power. Skin: No rashes, lesions or ulcers Psychiatry: Judgement and insight appear normal. Mood & affect appropriate.     Data Reviewed: I have personally reviewed following labs and imaging studies  CBC:  Recent Labs Lab 11/03/15 1456 11/04/15 0647 11/06/15 1052 11/07/15 0620  WBC 8.6 9.0 9.6 6.7  NEUTROABS 6.5  --   --   --   HGB 9.8* 8.4* 9.3* 9.8*  HCT 32.5* 27.3* 31.1* 31.9*  MCV 95.3 92.2 96.0 93.5  PLT 262 231 298 258   Basic Metabolic Panel:  Recent Labs Lab 11/03/15 1456 11/04/15 0647 11/06/15 1052 11/07/15 0620  NA 134* 134* 134* 138  K 3.7 4.2 3.8 3.8  CL 97* 99* 100* 105  CO2 30 29 29 28   GLUCOSE 132* 131* 152* 109*  BUN 20 30* 36* 31*  CREATININE 1.87* 1.75* 0.95 0.94  CALCIUM 9.6 9.3 8.9 9.4   GFR: Estimated Creatinine Clearance: 45.6 mL/min (by C-G formula based on SCr of 0.94 mg/dL). Liver Function Tests: No results for input(s): AST, ALT, ALKPHOS, BILITOT, PROT, ALBUMIN in the last 168 hours. No results for input(s): LIPASE, AMYLASE in the last 168 hours. No results for input(s): AMMONIA in the last 168 hours. Coagulation Profile: No results for input(s): INR, PROTIME in the last 168 hours. Cardiac  Enzymes:  Recent Labs Lab 11/03/15 1456  TROPONINI <0.03   BNP (last 3 results) No results for input(s): PROBNP in the last 8760 hours. HbA1C:  Recent Labs  11/05/15 1327  HGBA1C 5.3   CBG:  Recent Labs Lab 11/06/15 1124 11/06/15 1613 11/06/15 2157 11/07/15 0820 11/07/15 1124  GLUCAP 151* 174* 178* 115* 224*   Lipid Profile: No results for input(s): CHOL, HDL, LDLCALC, TRIG, CHOLHDL, LDLDIRECT in the last 72 hours. Thyroid Function Tests:  Recent Labs  11/05/15 1327 11/06/15 1030  TSH 0.169*  --   FREET4  --  1.06   Anemia Panel:  Recent Labs  11/05/15 1327  VITAMINB12 978*  FOLATE 22.7  FERRITIN 86  TIBC 323  IRON 79   Sepsis Labs: No results for input(s): PROCALCITON, LATICACIDVEN in the last 168 hours.  No results found for this or any previous visit (from the past 240 hour(s)).       Radiology Studies: No results found.  Scheduled Meds: . acetaZOLAMIDE  250 mg Oral BID  . aspirin  81 mg Oral Daily  . budesonide (PULMICORT) nebulizer solution  0.25 mg Nebulization BID  . busPIRone  15 mg Oral BID  . docusate sodium  100 mg Oral BID  . enoxaparin (LOVENOX) injection  30 mg Subcutaneous Q24H  . fluticasone furoate-vilanterol  1 puff Inhalation Daily  . guaiFENesin-dextromethorphan  5 mL Oral BID  . insulin aspart  0-15 Units Subcutaneous TID WC  . insulin aspart  0-5 Units Subcutaneous QHS  . methylPREDNISolone (SOLU-MEDROL) injection  60 mg Intravenous Q8H  . metoprolol tartrate  25 mg Oral BID  . sodium chloride flush  3 mL Intravenous Q12H  . tiotropium  18 mcg Inhalation Daily   Continuous Infusions: . sodium chloride 10 mL/hr at 11/07/15 0509     LOS: 3 days    Time spent: 30 minutes    Elliot Cousin, MD [facility] Pager [phone]  If 7PM-7AM, please contact night-coverage [email or web address] Password [email or web address] 11/07/2015, 1:13 PM

## 2015-11-08 LAB — GLUCOSE, CAPILLARY
GLUCOSE-CAPILLARY: 120 mg/dL — AB (ref 65–99)
GLUCOSE-CAPILLARY: 202 mg/dL — AB (ref 65–99)

## 2015-11-08 MED ORDER — GUAIFENESIN-DM 100-10 MG/5ML PO SYRP: 5.0000 mL | ORAL_SOLUTION | ORAL | Status: DC | PRN

## 2015-11-08 MED ORDER — LISINOPRIL-HYDROCHLOROTHIAZIDE 20-25 MG PO TABS: 0.5000 | ORAL_TABLET | Freq: Every day | ORAL | Status: DC

## 2015-11-08 MED ORDER — PREDNISONE 10 MG PO TABS: ORAL_TABLET | ORAL | 0 refills | Status: DC

## 2015-11-08 NOTE — Discharge Summary (Signed)
Physician Discharge Summary  Sheila Pierce FVC:[id number] DOB: 07-04-1945 DOA: 11/03/2015  PCP: No PCP Per Patient  Admit date: 11/03/2015 Discharge date: 11/08/2015  Time spent: Greater than 30 minutes  Recommendations for Outpatient Follow-up:  1. Recommend reassessing the patient's thyroid function test in 3-6 months. 2. Recommend reassessment of antihypertensive medication therapy as the lisinopril/HCTZ dose was decreased.     Discharge Diagnoses:  1. Oxygen dependent COPD with emphysema with acute exacerbation. 2. Chronic hypoxic respiratory failure with hypoxia, secondary to COPD. 3. Acute kidney injury. 4. Essential hypertension. 5. Chronic anxiety. 6. Type 2 diabetes mellitus. 7. Normocytic anemia. 8. Low TSH and normal free T4. 9. Obesity. 10. Sinus tachycardia secondary to bronchodilators.   Discharge Condition: Improved  Diet recommendation: Heart healthy  Filed Weights   11/03/15 1347 11/03/15 2136  Weight: 63.5 kg (140 lb) 63.3 kg (139 lb 9.6 oz)    History of present illness:  Patient is a 70 year old with a history of severe COPD with emphysema causing chronic hypoxia respiratory failure, DM, CAD, HTN, GERD, and chronic anxiety, who presented to the ED on 11/03/2015 with shortness of breath. In the ED, she was afebrile, tachycardic, and with a low-normal blood pressure. Her troponin I was negative. Her chest x-ray revealed no acute cardiopulmonary abnormality seen. Her EKG revealed sinus tachycardia. She was admitted for further evaluation and management.   Hospital Course:  1. COPD with exacerbation causing acute on chronic respiratory failure with hypoxia. Patient is treated chronically with 2 L nasal cannula oxygen, DuoNebs, Qvar and Breo inhalers and when necessary albuterol inhaler. She was started on IV Solu-Medrol, Breo Ellipta and Pulmicort nebulizer. Antibiotic was apparently not started on admission.  -Due to the persistent wheezes, Solu-Medrol was  increased to 60 mg every 8 hours. Xopenex was going to be added, but Pharmacy reported that it can causes prolonged QT interval and this patient has a history of it.  -Spiriva was started. Robitussin-DM was started to help with her cough. -Due to her penicillin allergy and other antibiotics causing potential prolonged QT interval, antibiotic therapy was not given. She remained afebrile and her white blood cell count normalized. Her follow-up chest x-ray revealed no acute disease, but with stable emphysema. Her oxygen saturations improved and remained stable in the mid to upper 90s on 2 L of nasal cannula oxygen. -She was discharged on her home regimen of bronchodilators, supplemental oxygen, and a prednisone taper.  Acute kidney injury. Patient's creatinine was 1.04 approximately 3 weeks ago. It was 1.87 on admission.  She was given IV fluids in the ED with slight improvement in her creatinine. -Gentle IV fluids were restarted. Lisinopril/HCTZ was withheld. Her follow-up creatinine improved and normalized.   Essential hypertension. The patient is treated chronically with lisinopril/HCTZ and metoprolol. Metoprolol was continued, but lisinopril/HCTZ was withheld due to AKI. Her blood pressure remained stable and controlled. At the time of discharge, the patient was instructed to restart lisinopril/HCTZ at half of the previous home dose.  Chronic anxiety. The patient is treated chronically with BuSpar. It was continued.  Type II insulin requiring diabetes mellitus vs Prediabetes. Patient is treated chronically with sliding scale Humulin R. Humulin R was withheld, but she was started on sliding scale NovoLog. Her CBGs remained reasonable. Her A1c was 5.3.  Normocytic anemia. Patient's hemoglobin was 10.1 three weeks ago and 9.8 on admission. It had fallen to 8.4. The patient denied bright red blood per rectum or black tarry stools. -Anemia panel was ordered for evaluation  and was virtually  unremarkable. Her hemoglobin improved to 9.8 without transfusion. The hemoglobin of 8.4 result may have been a error.  Sinus tachycardia.  Patient's heart rate has been intermittently elevated, likely from bronchodilators and COPD exacerbation.Marland Kitchen. However, TSH was ordered and was found to be low, suspicious for hyperthyroidism. Free T4 was ordered and was within normal limits. -Her heart rate normalized.  Low TSH. Patient's TSH is low at 0.169, however, her free T4 was within normal limits at 1.06. ? Sick euthyroid syndrome. Patient will need chronic monitoring of her thyroid function and possible referral to endocrinology.   Procedures:  None  Consultations:  None  Discharge Exam: Vitals:   11/07/15 2308 11/08/15 0456  BP: 130/64 (!) 110/47  Pulse: 87 84  Resp: 17 17  Temp: 98 F (36.7 C) 97.6 F (36.4 C)  Oxygen saturation 100%.   General exam: Appears calm and comfortable  Respiratory system: Rare fine expiratory wheezes, decreased from yesterday.  Respiratory effort normal. Cardiovascular system: S1 & S2 no murmurs rubs or gallops. No pedal edema. Gastrointestinal system: Abdomen is nondistended, soft and nontender. No organomegaly or masses felt. Normal bowel sounds heard. Central nervous system: Alert and oriented. No focal neurological deficits.   Discharge Instructions   Discharge Instructions    Diet - low sodium heart healthy    Complete by:  As directed    Increase activity slowly    Complete by:  As directed      Current Discharge Medication List    START taking these medications   Details  guaiFENesin-dextromethorphan (ROBITUSSIN DM) 100-10 MG/5ML syrup Take 5 mLs by mouth every 4 (four) hours as needed for cough.    predniSONE (DELTASONE) 10 MG tablet Starting on 11/09/15, take 6 tablets daily for 1 day; then 5 tablets the next day; then 4 tablets the next day; then 3 tablets the next day; then 2 tablets the next day; then 1 tablet the next day;  then stop. Qty: 21 tablet, Refills: 0      CONTINUE these medications which have CHANGED   Details  lisinopril-hydrochlorothiazide (PRINZIDE,ZESTORETIC) 20-25 MG tablet Take 0.5 tablets by mouth daily.      CONTINUE these medications which have NOT CHANGED   Details  acetaZOLAMIDE (DIAMOX) 250 MG tablet Take 250 mg by mouth 2 (two) times daily.     albuterol (PROVENTIL HFA;VENTOLIN HFA) 108 (90 BASE) MCG/ACT inhaler Inhale 2 puffs into the lungs every 4 (four) hours as needed for wheezing or shortness of breath.     aspirin 81 MG tablet Take 81 mg by mouth daily.    beclomethasone (QVAR) 40 MCG/ACT inhaler Inhale 1 puff into the lungs 2 (two) times daily.    BREO ELLIPTA 100-25 MCG/INH AEPB INHALE 1 PUFF BY MOUTH AT THE SAME TIME EACH DAY Refills: 1    busPIRone (BUSPAR) 15 MG tablet Take 15 mg by mouth 2 (two) times daily.     Cholecalciferol 1000 units capsule Take 1,000 Units by mouth daily.     insulin regular (NOVOLIN R,HUMULIN R) 100 units/mL injection Inject 1-10 Units into the skin 3 (three) times daily before meals. Per sliding scale instructions::: 0-150= 0 units 151-200= 2 units 201-250= 4 units 251-300= 6 units 301-350= 8 units 351-400=10units    ipratropium-albuterol (DUONEB) 0.5-2.5 (3) MG/3ML SOLN Take 3 mLs by nebulization every 4 (four) hours.    metoprolol tartrate (LOPRESSOR) 25 MG tablet Take 25 mg by mouth 2 (two) times daily.    moxifloxacin (VIGAMOX)  0.5 % ophthalmic solution 1 drop 3 (three) times daily.    Multiple Vitamin (MULTIVITAMIN WITH MINERALS) TABS tablet Take 1 tablet by mouth daily.    potassium chloride (K-DUR) 10 MEQ tablet Take 10 mEq by mouth daily. Refills: 0    traMADol (ULTRAM) 50 MG tablet Take 50 mg by mouth every 6 (six) hours as needed for moderate pain or severe pain.        Allergies  Allergen Reactions  . Penicillins Swelling  . Codeine Nausea Only  . Hydrocodone Nausea Only  . Lorcet [Hydrocodone-Acetaminophen]  Other (See Comments)       . Sulfa Antibiotics     Hallucinations    Follow-up Information    [name], MD Follow up on 11/11/2015.   Specialty:  Family Medicine Why:  @ 1pm Contact information: [address] [place] [address] [phone]            The results of significant diagnostics from this hospitalization (including imaging, microbiology, ancillary and laboratory) are listed below for reference.    Significant Diagnostic Studies: Dg Chest Port 1 View  Result Date: 11/07/2015 CLINICAL DATA:  COPD exacerbation EXAM: PORTABLE CHEST 1 VIEW COMPARISON:  11/03/2015 chest radiograph. FINDINGS: Slightly right rotated chest radiograph Stable cardiomediastinal silhouette with normal heart size and aortic atherosclerosis. No pneumothorax. No pleural effusion. Emphysema. No pulmonary edema. No acute consolidative airspace disease. Curvilinear opacities at the right lung base appears stable, consistent with mild scarring. IMPRESSION: 1. No acute cardiopulmonary disease. 2. Emphysema.  Stable mild scarring at the right lung base. 3. Aortic atherosclerosis. Electronically Signed   By: Delbert Phenix M.D.   On: 11/07/2015 13:55   Dg Chest Port 1 View  Result Date: 11/03/2015 CLINICAL DATA:  Shortness of breath. EXAM: PORTABLE CHEST 1 VIEW COMPARISON:  Radiograph of October 20, 2015. FINDINGS: The heart size and mediastinal contours are within normal limits. Both lungs are clear. Atherosclerosis of thoracic aorta is noted. No pneumothorax or pleural effusion is noted. The visualized skeletal structures are unremarkable. IMPRESSION: Aortic atherosclerosis.  No acute cardiopulmonary abnormality seen. Electronically Signed   By: Lupita Raider, M.D.   On: 11/03/2015 14:25    Microbiology: No results found for this or any previous visit (from the past 240 hour(s)).   Labs: Basic Metabolic Panel:  Recent Labs Lab 11/03/15 1456 11/04/15 0647 11/06/15 1052  11/07/15 0620  NA 134* 134* 134* 138  K 3.7 4.2 3.8 3.8  CL 97* 99* 100* 105  CO2 30 29 29 28   GLUCOSE 132* 131* 152* 109*  BUN 20 30* 36* 31*  CREATININE 1.87* 1.75* 0.95 0.94  CALCIUM 9.6 9.3 8.9 9.4   Liver Function Tests: No results for input(s): AST, ALT, ALKPHOS, BILITOT, PROT, ALBUMIN in the last 168 hours. No results for input(s): LIPASE, AMYLASE in the last 168 hours. No results for input(s): AMMONIA in the last 168 hours. CBC:  Recent Labs Lab 11/03/15 1456 11/04/15 0647 11/06/15 1052 11/07/15 0620  WBC 8.6 9.0 9.6 6.7  NEUTROABS 6.5  --   --   --   HGB 9.8* 8.4* 9.3* 9.8*  HCT 32.5* 27.3* 31.1* 31.9*  MCV 95.3 92.2 96.0 93.5  PLT 262 231 298 258   Cardiac Enzymes:  Recent Labs Lab 11/03/15 1456  TROPONINI <0.03   BNP: BNP (last 3 results) No results for input(s): BNP in the last 8760 hours.  ProBNP (last 3 results) No results for input(s): PROBNP in the  last 8760 hours.  CBG:  Recent Labs Lab 11/07/15 0820 11/07/15 1124 11/07/15 1654 11/07/15 2305 11/08/15 0726  GLUCAP 115* 224* 91 131* 120*       Signed:  Kristyne Woodring MD.  [facility] 11/08/2015, 10:16 AM

## 2015-11-08 NOTE — Progress Notes (Signed)
Discharge instructions read to patient and her family.  All verbalized understanding of instructions.Discharged to home with family 

## 2015-11-08 NOTE — Progress Notes (Signed)
Patient with orders to be discharge home. Discharge instructions given, patient verbalized understanding. Prescription given. Patient stable. Patient left in private vehicle with family.  

## 2015-11-08 NOTE — Plan of Care (Signed)
Problem: Respiratory: Goal: Ability to maintain normal oxygenation or baseline will improve Outcome: Completed/Met Date Met: 11/08/15 Home O2

## 2015-11-09 DIAGNOSIS — J440 Chronic obstructive pulmonary disease with acute lower respiratory infection: Secondary | ICD-10-CM | POA: Diagnosis not present

## 2015-11-10 DIAGNOSIS — J440 Chronic obstructive pulmonary disease with acute lower respiratory infection: Secondary | ICD-10-CM | POA: Diagnosis not present

## 2015-11-11 ENCOUNTER — Ambulatory Visit (INDEPENDENT_AMBULATORY_CARE_PROVIDER_SITE_OTHER): Payer: Medicare HMO | Admitting: Family Medicine

## 2015-11-11 ENCOUNTER — Encounter: Payer: Self-pay | Admitting: Family Medicine

## 2015-11-11 VITALS — BP 148/58 | HR 92 | Temp 98.5°F | Resp 32 | Ht 60.0 in | Wt 138.0 lb

## 2015-11-11 VITALS — BP 148/58 | HR 92 | Temp 98.5°F | Resp 32 | Ht 60.0 in | Wt 138.1 lb

## 2015-11-11 DIAGNOSIS — Z09 Encounter for follow-up examination after completed treatment for conditions other than malignant neoplasm: Secondary | ICD-10-CM

## 2015-11-11 DIAGNOSIS — J431 Panlobular emphysema: Secondary | ICD-10-CM

## 2015-11-11 DIAGNOSIS — R69 Illness, unspecified: Secondary | ICD-10-CM | POA: Diagnosis not present

## 2015-11-11 DIAGNOSIS — B351 Tinea unguium: Secondary | ICD-10-CM

## 2015-11-11 DIAGNOSIS — F419 Anxiety disorder, unspecified: Secondary | ICD-10-CM | POA: Diagnosis not present

## 2015-11-11 DIAGNOSIS — I700 Atherosclerosis of aorta: Secondary | ICD-10-CM

## 2015-11-11 DIAGNOSIS — Z9981 Dependence on supplemental oxygen: Secondary | ICD-10-CM

## 2015-11-11 DIAGNOSIS — M810 Age-related osteoporosis without current pathological fracture: Secondary | ICD-10-CM

## 2015-11-11 DIAGNOSIS — Z23 Encounter for immunization: Secondary | ICD-10-CM

## 2015-11-11 DIAGNOSIS — Z7689 Persons encountering health services in other specified circumstances: Secondary | ICD-10-CM

## 2015-11-11 DIAGNOSIS — J9610 Chronic respiratory failure, unspecified whether with hypoxia or hypercapnia: Secondary | ICD-10-CM | POA: Diagnosis not present

## 2015-11-11 DIAGNOSIS — J440 Chronic obstructive pulmonary disease with acute lower respiratory infection: Secondary | ICD-10-CM | POA: Diagnosis not present

## 2015-11-11 DIAGNOSIS — I10 Essential (primary) hypertension: Secondary | ICD-10-CM | POA: Diagnosis not present

## 2015-11-11 DIAGNOSIS — Z1239 Encounter for other screening for malignant neoplasm of breast: Secondary | ICD-10-CM

## 2015-11-11 DIAGNOSIS — Z1231 Encounter for screening mammogram for malignant neoplasm of breast: Secondary | ICD-10-CM

## 2015-11-11 DIAGNOSIS — E118 Type 2 diabetes mellitus with unspecified complications: Secondary | ICD-10-CM

## 2015-11-11 DIAGNOSIS — J441 Chronic obstructive pulmonary disease with (acute) exacerbation: Secondary | ICD-10-CM

## 2015-11-11 DIAGNOSIS — J449 Chronic obstructive pulmonary disease, unspecified: Secondary | ICD-10-CM | POA: Diagnosis not present

## 2015-11-11 MED ORDER — BREO ELLIPTA 100-25 MCG/INH IN AEPB: 1.0000 | INHALATION_SPRAY | Freq: Every day | RESPIRATORY_TRACT | 1 refills | Status: DC

## 2015-11-11 MED ORDER — INSULIN REGULAR HUMAN 100 UNIT/ML IJ SOLN: 1.0000 [IU] | Freq: Three times a day (TID) | INTRAMUSCULAR | 5 refills | Status: DC

## 2015-11-11 MED ORDER — LISINOPRIL-HYDROCHLOROTHIAZIDE 10-12.5 MG PO TABS: 1.0000 | ORAL_TABLET | Freq: Every day | ORAL | 3 refills | Status: DC

## 2015-11-11 MED ORDER — BECLOMETHASONE DIPROPIONATE 40 MCG/ACT IN AERS: 1.0000 | INHALATION_SPRAY | Freq: Two times a day (BID) | RESPIRATORY_TRACT | 3 refills | Status: DC

## 2015-11-11 MED ORDER — PREDNISONE 10 MG PO TABS: 10.0000 mg | ORAL_TABLET | Freq: Two times a day (BID) | ORAL | 0 refills | Status: DC

## 2015-11-11 MED ORDER — ALBUTEROL SULFATE HFA 108 (90 BASE) MCG/ACT IN AERS: 2.0000 | INHALATION_SPRAY | RESPIRATORY_TRACT | 0 refills | Status: DC | PRN

## 2015-11-11 MED ORDER — DOXYCYCLINE HYCLATE 100 MG PO TABS: 100.0000 mg | ORAL_TABLET | Freq: Two times a day (BID) | ORAL | 0 refills | Status: DC

## 2015-11-11 MED ORDER — IPRATROPIUM-ALBUTEROL 0.5-2.5 (3) MG/3ML IN SOLN: 3.0000 mL | RESPIRATORY_TRACT | 5 refills | Status: DC

## 2015-11-11 MED ORDER — ACETAZOLAMIDE 250 MG PO TABS: 250.0000 mg | ORAL_TABLET | Freq: Two times a day (BID) | ORAL | 1 refills | Status: DC

## 2015-11-11 MED ORDER — POTASSIUM CHLORIDE ER 10 MEQ PO TBCR: 10.0000 meq | EXTENDED_RELEASE_TABLET | Freq: Every day | ORAL | 3 refills | Status: DC

## 2015-11-11 MED ORDER — BUSPIRONE HCL 15 MG PO TABS: 15.0000 mg | ORAL_TABLET | Freq: Two times a day (BID) | ORAL | 3 refills | Status: DC

## 2015-11-11 MED ORDER — METOPROLOL TARTRATE 25 MG PO TABS: 25.0000 mg | ORAL_TABLET | Freq: Two times a day (BID) | ORAL | 3 refills | Status: DC

## 2015-11-11 MED ORDER — TRAMADOL HCL 50 MG PO TABS: 50.0000 mg | ORAL_TABLET | Freq: Four times a day (QID) | ORAL | 1 refills | Status: DC | PRN

## 2015-11-11 NOTE — Patient Instructions (Signed)
Need old records From hospital and PCP  Continue current medicines  I am referring to podiatry I am referring to diabetes specialist endocrinology I have ordered x rays mammogram and dexa scan and back to be done on same day  See me in one month

## 2015-11-11 NOTE — Progress Notes (Signed)
Chief Complaint  Patient presents with  . COPD    hospital f/u  Patient is here for hospital discharge follow-up She initially presented to [facility] in [place] and was transferred to [facility] in [place] for acute shortness of breath. She was admitted for exacerbation of COPD. She has a history of coronary artery disease. She did not have any elevation of her troponins or change in her EKG. She was managed with IV fluids and prednisone. There is no sign of infection and antibiotics were avoided. Her chart is reviewed and all testing discussed with patient and her son Lorin Picket who accompanies her today. She feels like she is back to her baseline. She states she feels very tired. She has end-stage COPD from long-term cigarette smoking and is oxygen dependent. She is not currently under the care of pulmonology. She states she is compliant with her inhalers. She quit smoking in October 2016. She states that no one smokes in her home. She states that she had a flu shot in October. She has not had a pneumonia shot for a couple of years, is uncertain whether she has had a Prevnar. Prevnar is given to her today as well as a Information systems manager. Patient is very inactive due to her severe COPD. She spends most of her day in her bedroom, has a hospital bed and a television. She does not ambulate very much. She uses a wheelchair. She states her house is safe. She has in-home aides. She has an oxygen compressor at home. It does not have humidification, and it is advised that they get a humidifier for her.  Admit date: 11/03/2015 Discharge date: 11/08/2015 Discharge Diagnoses:  1. Oxygen dependent COPD with emphysema with acute exacerbation. 2. Chronic hypoxic respiratory failure with hypoxia, secondary to COPD. 3. Acute kidney injury. 4. Essential hypertension. 5. Chronic anxiety. 6. Type 2 diabetes mellitus. 7. Normocytic anemia. 8. Low TSH and normal free T4. 9. Obesity. 10. Sinus tachycardia  secondary to bronchodilators. Signed: FISHER,DENISE MD.  [facility] 11/08/2015  I discussed with Shali and her son her chronic, end-stage, COPD. We discussed that optimal management includes regular use of her inhalers, avoidance of infection, and avoidance of triggers (like cigarette smoke), and overall good health with good nutrition and adequate fluid intake. Even with good management she may occasionally feel more short of breath in which case she would use the albuterol. I did discuss with them and prescribed a 5 day supply of emergency prednisone and doxycycline to take if she becomes significantly dyspneic at home in order to keep her out of the hospital. They are instructed that if they use these medicines they must call the office and be seen for an outpatient visit. Discussed that the goal is to keep her out of the hospital.   Patient Active Problem List   Diagnosis Date Noted  . Onychomycosis of toenail 11/11/2015  . Aortic atherosclerosis (HCC) 11/11/2015  . Abnormal thyroid function test 11/06/2015  . Diabetes mellitus type II, controlled (HCC) 11/05/2015  . COPD exacerbation (HCC) 11/04/2015  . Hyponatremia 11/03/2015  . AKI (acute kidney injury) (HCC) 11/03/2015  . Normocytic anemia 11/03/2015  . Anxiety 11/03/2015  . Left renal mass 03/21/2014  . Acute exacerbation of chronic obstructive pulmonary disease (COPD) (HCC) 09/19/2012  . CAD (coronary artery disease) 11/24/2011  . Dyslipidemia 11/24/2011  . Essential hypertension   . QT prolongation   . Hypokalemia   . MI, acute, non ST segment elevation (HCC) 10/05/2011  Outpatient Encounter Prescriptions as of 11/11/2015  Medication Sig  . aspirin 81 MG tablet Take 81 mg by mouth daily.  . Cholecalciferol 1000 units capsule Take 1,000 Units by mouth daily.   Marland Kitchen. doxycycline (VIBRA-TABS) 100 MG tablet Take 1 tablet (100 mg total) by mouth 2 (two) times daily.  Marland Kitchen. guaiFENesin-dextromethorphan (ROBITUSSIN DM) 100-10  MG/5ML syrup Take 5 mLs by mouth every 4 (four) hours as needed for cough.  . Multiple Vitamin (MULTIVITAMIN WITH MINERALS) TABS tablet Take 1 tablet by mouth daily.  . predniSONE (DELTASONE) 10 MG tablet Starting on 11/09/15, take 6 tablets daily for 1 day; then 5 tablets the next day; then 4 tablets the next day; then 3 tablets the next day; then 2 tablets the next day; then 1 tablet the next day; then stop.  . predniSONE (DELTASONE) 10 MG tablet Take 1 tablet (10 mg total) by mouth 2 (two) times daily with a meal.   No facility-administered encounter medications on file as of 11/11/2015.     Allergies  Allergen Reactions  . Penicillins Swelling  . Sulfa Antibiotics Hives    Hallucinations   . Codeine Nausea Only  . Hydrocodone Nausea Only    Review of Systems  Constitutional: Positive for activity change, appetite change and fatigue. Negative for unexpected weight change.  HENT: Negative for congestion, postnasal drip, rhinorrhea and sinus pressure.   Eyes: Negative for visual disturbance.  Respiratory: Positive for cough, shortness of breath and wheezing.        Per patient baseline  Cardiovascular: Negative for chest pain, palpitations and leg swelling.  Gastrointestinal: Negative for blood in stool, constipation and diarrhea.  Genitourinary: Negative for difficulty urinating and frequency.  Musculoskeletal: Positive for back pain and myalgias.       Hurt all over  Skin: Negative.   Neurological: Positive for light-headedness.       Still with positional dizziness  Psychiatric/Behavioral: Positive for dysphoric mood and sleep disturbance. The patient is nervous/anxious.     BP (!) 148/58 (BP Location: Left Arm, Patient Position: Sitting, Cuff Size: Normal)   Pulse 92   Temp 98.5 F (36.9 C) (Oral)   Resp (!) 32   Ht 5' (1.524 m)   Wt 138 lb (62.6 kg)   SpO2 100% Comment: o2 3 lpm via n/c  BMI 26.95 kg/m   Physical Exam  Constitutional: She is oriented to person,  place, and time. She appears well-developed and well-nourished. She appears distressed.  In wheelchair.  Moderate respiratory distress, tachypnea  HENT:  Head: Normocephalic and atraumatic.  Right Ear: External ear normal.  Left Ear: External ear normal.  Mouth dry  Eyes: Conjunctivae are normal. Pupils are equal, round, and reactive to light.  Neck: Normal range of motion. Neck supple. No thyromegaly present.  Cardiovascular: Normal rate, regular rhythm and normal heart sounds.   Pulmonary/Chest: She has no wheezes. She has no rales.  Decreased breath sounds throughout  Abdominal: Soft. Bowel sounds are normal.  Musculoskeletal: Normal range of motion. She exhibits no edema.  Lymphadenopathy:    She has no cervical adenopathy.  Neurological: She is alert and oriented to person, place, and time. She displays normal reflexes ().  Skin: Skin is warm and dry.  Psychiatric: She has a normal mood and affect. Her behavior is normal. Thought content normal.  Nursing note and vitals reviewed.   ASSESSMENT/PLAN:  1. COPD with exacerbation (HCC) Currently stable at baseline - Ambulatory referral to Pulmonology  2. Hospital discharge follow-up  3. Oxygen dependent     Patient Instructions  Use the inhalers breo and qvar both Use the duo neb 4 times a day Use the albuterol only for wheezing as needed in addition Try to get a humidifier Drink more water  If wheezing increases significantly, use the emergency prednisone If signs of infection - fever or colored sputum, then add the doxycycline antibiotic Call office for increasing shortness of breath  I have ordered pulmonary consultation  See me in a month     [name], MD

## 2015-11-11 NOTE — Patient Instructions (Addendum)
Use the inhalers breo and qvar both Use the duo neb 4 times a day Use the albuterol only for wheezing as needed in addition Try to get a humidifier Drink more water  If wheezing increases significantly, use the emergency prednisone If signs of infection - fever or colored sputum, then add the doxycycline antibiotic Call office for increasing shortness of breath  I have ordered pulmonary consultation  See me in a month

## 2015-11-11 NOTE — Progress Notes (Signed)
Chief Complaint  Patient presents with  . Establish Care   Patient is here new to establish with this office as her primary care. Patient states her prior primary care doctor discharged her because she went into the hospital too much. She was recently hospitalized for an exacerbation of her COPD. Hospital follow-up evaluation is dictated separately. The patient lives at home with her 2 adult sons. She has a hospital bed. She states she spends much of her time in her room watching television. She is oxygen dependent. She has end-stage COPD from long-term cigarette smoking. She quit smoking one year ago. No one smokes in their home. She is compliant with her inhalers. She has long-standing hypertension which has been under good control per patient. History of coronary artery disease. No current chest pain or cardiac symptoms. Patient was recently diagnosed with diabetes. Interestingly, the only 2 hemoglobin A1c is on record are both normal. She states this diagnosis came from her stay at [facility], and that her sugar was elevated due to prednisone. She was started on sliding scale insulin. She was sent home on sliding scale insulin. They have only used insulin one time for sugar of 176. I doubt the diagnosis of diabetes. I'm going to send her to endocrinology for evaluation. I do not think she needs to be on insulin. The patient has long-standing anxiety. She is on BuSpar 15 mg twice a day. This works well for her. She would like this medicine refilled. She doesn't have side effects. She does have a history of osteoporosis. She has not had a DEXA scan for many years. She fell and injured her low back. She was told she might have a fracture in her back. She never had this x-rayed. She is requesting an x-ray of her back. She is overdue for mammogram. I have ordered a mammogram, DEXA scan, and lumbar x-ray all to be done on the same day. She does have transportation challenges. Patient had a  hysterectomy for bleeding and does not need Pap screening Patient had her flu shot at [facility] when she was there October 9. She has not had a pneumonia shot for 3 or 4 years. She doesn't think she ever had a Prevnar shot. She is given a Prevnar today. She hasn't had a tetanus shot more than 10 years. DTaP is given today. She's never had a shingles shot. We'll consider that at a future visit.    Patient Active Problem List   Diagnosis Date Noted  . Onychomycosis of toenail 11/11/2015  . Abnormal thyroid function test 11/06/2015  . Diabetes mellitus type II, controlled (HCC) 11/05/2015  . COPD exacerbation (HCC) 11/04/2015  . Hyponatremia 11/03/2015  . AKI (acute kidney injury) (HCC) 11/03/2015  . Normocytic anemia 11/03/2015  . Anxiety 11/03/2015  . Acute exacerbation of chronic obstructive pulmonary disease (COPD) (HCC) 09/19/2012  . CAD (coronary artery disease) 11/24/2011  . Dyslipidemia 11/24/2011  . Essential hypertension   . QT prolongation   . Hypokalemia   . MI, acute, non ST segment elevation (HCC) 10/05/2011    Outpatient Encounter Prescriptions as of 11/11/2015  Medication Sig  . acetaZOLAMIDE (DIAMOX) 250 MG tablet Take 1 tablet (250 mg total) by mouth 2 (two) times daily.  Marland Kitchen albuterol (PROVENTIL HFA;VENTOLIN HFA) 108 (90 Base) MCG/ACT inhaler Inhale 2 puffs into the lungs every 4 (four) hours as needed for wheezing or shortness of breath.  Marland Kitchen aspirin 81 MG tablet Take 81 mg by mouth daily.  . beclomethasone (  QVAR) 40 MCG/ACT inhaler Inhale 1 puff into the lungs 2 (two) times daily.  Marland Kitchen BREO ELLIPTA 100-25 MCG/INH AEPB Inhale 1 puff into the lungs daily.  . busPIRone (BUSPAR) 15 MG tablet Take 1 tablet (15 mg total) by mouth 2 (two) times daily.  . Cholecalciferol 1000 units capsule Take 1,000 Units by mouth daily.   Marland Kitchen guaiFENesin-dextromethorphan (ROBITUSSIN DM) 100-10 MG/5ML syrup Take 5 mLs by mouth every 4 (four) hours as needed for cough.  . insulin regular  (NOVOLIN R,HUMULIN R) 100 units/mL injection Inject 0.01-0.1 mLs (1-10 Units total) into the skin 3 (three) times daily before meals. Per sliding scale instructions::: 0-150= 0 units 151-200= 2 units 201-250= 4 units 251-300= 6 units 301-350= 8 units 351-400=10units  . ipratropium-albuterol (DUONEB) 0.5-2.5 (3) MG/3ML SOLN Take 3 mLs by nebulization every 4 (four) hours.  Marland Kitchen lisinopril-hydrochlorothiazide (PRINZIDE,ZESTORETIC) 20-25 MG tablet Take 0.5 tablets by mouth daily.  . metoprolol tartrate (LOPRESSOR) 25 MG tablet Take 1 tablet (25 mg total) by mouth 2 (two) times daily.  . Multiple Vitamin (MULTIVITAMIN WITH MINERALS) TABS tablet Take 1 tablet by mouth daily.  . potassium chloride (K-DUR) 10 MEQ tablet Take 1 tablet (10 mEq total) by mouth daily.  . predniSONE (DELTASONE) 10 MG tablet Starting on 11/09/15, take 6 tablets daily for 1 day; then 5 tablets the next day; then 4 tablets the next day; then 3 tablets the next day; then 2 tablets the next day; then 1 tablet the next day; then stop.  . traMADol (ULTRAM) 50 MG tablet Take 1 tablet (50 mg total) by mouth every 6 (six) hours as needed for moderate pain or severe pain.   No facility-administered encounter medications on file as of 11/11/2015.     Past Medical History:  Diagnosis Date  . Allergy   . Anemia   . Anxiety   . Arthritis   . Asthma   . Cataract   . Chronic kidney disease   . COPD (chronic obstructive pulmonary disease) (HCC)    Chronic resp failure on home O2  . Depression   . Diabetes mellitus without complication (HCC)   . Emphysema of lung (HCC)   . GERD (gastroesophageal reflux disease)   . Hyperglycemia    Noted 09/2011 admission in setting of steroid use with normal HgbA1C.  Marland Kitchen Hypertension   . Hypokalemia   . Hyponatremia    Noted 09/2011 admission.  . NSTEMI (non-ST elevated myocardial infarction) (HCC)    a. 09/2011 in setting of acute on chronic resp failure/COPD exacerbation --> cath demonstrated  nonobstructive CAD 10/05/11 with EF 50%;  08/2012 elevated Ti in setting of COPD flare -->Echo: EF 60-65%, Gr 1 DD -->Med Rx.  . Osteoporosis   . Oxygen deficiency   . QT prolongation    Noted on EKG 09/2011 (590 in setting of K of 3, improved to 475 by discharge)  . Tobacco abuse 09/19/2012    Past Surgical History:  Procedure Laterality Date  . ABDOMINAL HYSTERECTOMY     bleeding  . CHOLECYSTECTOMY    . LEFT HEART CATHETERIZATION WITH CORONARY ANGIOGRAM N/A 10/05/2011   Procedure: LEFT HEART CATHETERIZATION WITH CORONARY ANGIOGRAM;  Surgeon: Tonny Bollman, MD;  Location: [facility] CATH LAB;  Service: Cardiovascular;  Laterality: N/A;  . PLANTAR'S WART EXCISION      Social History   Social History  . Marital status: Unknown    Spouse name: N/A  . Number of children: N/A  . Years of education: N/A   Occupational  History  . retired    Social History Main Topics  . Smoking status: Former Smoker    Packs/day: 1.00    Years: 40.00    Types: Cigarettes    Start date: 01/26/1964    Quit date: 10/2014  . Smokeless tobacco: Never Used     Comment: 1 pack every 3 days  . Alcohol use No  . Drug use: No  . Sexual activity: Not Currently   Other Topics Concern  . Not on file   Social History Narrative   Her son and her adult grandson live with her.     Family History  Problem Relation Age of Onset  . Cancer Father     Lung  . Cancer Mother     Liver  . Hypertension Son   . Kidney disease Brother     liveer and kidney failure  . Cancer Other     colon    Review of Systems  Constitutional: Positive for malaise/fatigue. Negative for fever and weight loss.       Patient, in general, feels weak and tired  HENT: Negative for congestion and hearing loss.        Edentulous  Eyes: Negative for blurred vision and pain.       Decreased vision, cataracts  Respiratory: Positive for cough, shortness of breath and wheezing.        Baseline dyspnea and cough  Cardiovascular: Negative  for chest pain and leg swelling.  Gastrointestinal: Negative for abdominal pain, constipation, diarrhea and heartburn.  Genitourinary: Negative for dysuria and frequency.  Musculoskeletal: Positive for back pain and myalgias. Negative for falls and joint pain.       Low back,, " hurt all over"  Neurological: Positive for weakness. Negative for dizziness, seizures and headaches.  Endo/Heme/Allergies: Positive for environmental allergies. Bruises/bleeds easily.  Psychiatric/Behavioral: Negative for depression and suicidal ideas. The patient is nervous/anxious and has insomnia.     BP (!) 148/58 (BP Location: Left Arm, Patient Position: Sitting, Cuff Size: Normal)   Pulse 92   Temp 98.5 F (36.9 C) (Oral)   Resp (!) 32   Ht 5' (1.524 m)   Wt 138 lb 1.9 oz (62.7 kg)   SpO2 100% Comment: o2 3 lpm via n/c  BMI 26.97 kg/m   Physical Exam  Constitutional: She is oriented to person, place, and time. She appears well-developed and well-nourished. She appears distressed.  Short of breath. In wheelchair. Oxygen dependent.  HENT:  Head: Normocephalic and atraumatic.  Right Ear: External ear normal.  Left Ear: External ear normal.  Mouth/Throat: Oropharynx is clear and moist.  Edentulous. Mucous membranes dry  Eyes: Conjunctivae are normal. Pupils are equal, round, and reactive to light.  Neck: Normal range of motion. Neck supple. No thyromegaly present.  Cardiovascular: Normal rate, regular rhythm and normal heart sounds.   Decreased pedal pulses  Pulmonary/Chest: She has no wheezes. She has no rales.  Decreased breath sounds. Tachypnea. No rales or wheeze  Abdominal: Soft. Bowel sounds are normal. There is no tenderness.  Musculoskeletal: Normal range of motion. She exhibits no edema.  Examination chair. Morris moves all 4 extremities well  Lymphadenopathy:    She has no cervical adenopathy.  Neurological: She is alert and oriented to person, place, and time.  Skin: Skin is warm and  dry.  Thickened dystrophic toenails, deformities both second toenails  Psychiatric: She has a normal mood and affect. Her behavior is normal. Thought content normal.  ASSESSMENT/PLAN:  1. Type 2 diabetes mellitus with complication, unspecified long term insulin use status (HCC) As discussed above, doubt true diabetes. I think she has had some hyperglycemia due to her prednisone use. Her hemoglobin A1c measurements are 5.3 and 5.6 over the last couple of years. We'll refer to endocrinology for consultation. - Ambulatory referral to Podiatry - Ambulatory referral to Endocrinology  2. Onychomycosis of toenail  - Ambulatory referral to Podiatry  3. Essential hypertension Well controlled 4. Anxiety Controlled on BuSpar 5. Encounter to establish care with new doctor   6. Oxygen dependent   7. Panlobular emphysema (HCC) Recent hospitalization for exacerbation. 8. Age related osteoporosis, unspecified pathological fracture presence  - DG Bone Density; Future - DG Lumbar Spine Complete; Future  9. Breast cancer screening - MM Digital Screening; Future   Patient Instructions  Need old records From hospital and PCP  Continue current medicines  I am referring to podiatry I am referring to diabetes specialist endocrinology I have ordered x rays mammogram and dexa scan and back to be done on same day  See me in one month   Eustace MooreYvonne Sue Constance Whittle, MD

## 2015-11-12 DIAGNOSIS — J440 Chronic obstructive pulmonary disease with acute lower respiratory infection: Secondary | ICD-10-CM | POA: Diagnosis not present

## 2015-11-13 DIAGNOSIS — J440 Chronic obstructive pulmonary disease with acute lower respiratory infection: Secondary | ICD-10-CM | POA: Diagnosis not present

## 2015-11-14 DIAGNOSIS — J440 Chronic obstructive pulmonary disease with acute lower respiratory infection: Secondary | ICD-10-CM | POA: Diagnosis not present

## 2015-11-15 DIAGNOSIS — J440 Chronic obstructive pulmonary disease with acute lower respiratory infection: Secondary | ICD-10-CM | POA: Diagnosis not present

## 2015-11-16 DIAGNOSIS — J440 Chronic obstructive pulmonary disease with acute lower respiratory infection: Secondary | ICD-10-CM | POA: Diagnosis not present

## 2015-11-17 DIAGNOSIS — J440 Chronic obstructive pulmonary disease with acute lower respiratory infection: Secondary | ICD-10-CM | POA: Diagnosis not present

## 2015-11-18 DIAGNOSIS — J440 Chronic obstructive pulmonary disease with acute lower respiratory infection: Secondary | ICD-10-CM | POA: Diagnosis not present

## 2015-11-19 DIAGNOSIS — J440 Chronic obstructive pulmonary disease with acute lower respiratory infection: Secondary | ICD-10-CM | POA: Diagnosis not present

## 2015-11-20 DIAGNOSIS — J440 Chronic obstructive pulmonary disease with acute lower respiratory infection: Secondary | ICD-10-CM | POA: Diagnosis not present

## 2015-11-21 DIAGNOSIS — J440 Chronic obstructive pulmonary disease with acute lower respiratory infection: Secondary | ICD-10-CM | POA: Diagnosis not present

## 2015-11-22 DIAGNOSIS — J440 Chronic obstructive pulmonary disease with acute lower respiratory infection: Secondary | ICD-10-CM | POA: Diagnosis not present

## 2015-11-23 DIAGNOSIS — J440 Chronic obstructive pulmonary disease with acute lower respiratory infection: Secondary | ICD-10-CM | POA: Diagnosis not present

## 2015-11-24 DIAGNOSIS — R69 Illness, unspecified: Secondary | ICD-10-CM | POA: Diagnosis not present

## 2015-11-24 DIAGNOSIS — J449 Chronic obstructive pulmonary disease, unspecified: Secondary | ICD-10-CM | POA: Diagnosis not present

## 2015-11-24 DIAGNOSIS — I10 Essential (primary) hypertension: Secondary | ICD-10-CM | POA: Diagnosis not present

## 2015-11-24 DIAGNOSIS — J9611 Chronic respiratory failure with hypoxia: Secondary | ICD-10-CM | POA: Diagnosis not present

## 2015-11-24 DIAGNOSIS — J440 Chronic obstructive pulmonary disease with acute lower respiratory infection: Secondary | ICD-10-CM | POA: Diagnosis not present

## 2015-11-25 DIAGNOSIS — J440 Chronic obstructive pulmonary disease with acute lower respiratory infection: Secondary | ICD-10-CM | POA: Diagnosis not present

## 2015-11-26 ENCOUNTER — Ambulatory Visit (INDEPENDENT_AMBULATORY_CARE_PROVIDER_SITE_OTHER): Payer: Medicare HMO | Admitting: Podiatry

## 2015-11-26 ENCOUNTER — Encounter: Payer: Self-pay | Admitting: Podiatry

## 2015-11-26 ENCOUNTER — Encounter: Payer: Self-pay | Admitting: Family Medicine

## 2015-11-26 DIAGNOSIS — H353190 Nonexudative age-related macular degeneration, unspecified eye, stage unspecified: Secondary | ICD-10-CM | POA: Insufficient documentation

## 2015-11-26 DIAGNOSIS — M79674 Pain in right toe(s): Secondary | ICD-10-CM

## 2015-11-26 DIAGNOSIS — J440 Chronic obstructive pulmonary disease with acute lower respiratory infection: Secondary | ICD-10-CM | POA: Diagnosis not present

## 2015-11-26 DIAGNOSIS — M79675 Pain in left toe(s): Secondary | ICD-10-CM | POA: Diagnosis not present

## 2015-11-26 DIAGNOSIS — B351 Tinea unguium: Secondary | ICD-10-CM | POA: Diagnosis not present

## 2015-11-26 DIAGNOSIS — H25093 Other age-related incipient cataract, bilateral: Secondary | ICD-10-CM | POA: Insufficient documentation

## 2015-11-26 NOTE — Patient Instructions (Signed)
A diabetic foot examination demonstrated adequate circulation and feeling in your feet. The toenails are deformed from fungal infection and were debrided today Return every 3 months for toenail debridement  Diabetes and Foot Care Diabetes may cause you to have problems because of poor blood supply (circulation) to your feet and legs. This may cause the skin on your feet to become thinner, break easier, and heal more slowly. Your skin may become dry, and the skin may peel and crack. You may also have nerve damage in your legs and feet causing decreased feeling in them. You may not notice minor injuries to your feet that could lead to infections or more serious problems. Taking care of your feet is one of the most important things you can do for yourself.  HOME CARE INSTRUCTIONS  Wear shoes at all times, even in the house. Do not go barefoot. Bare feet are easily injured.  Check your feet daily for blisters, cuts, and redness. If you cannot see the bottom of your feet, use a mirror or ask someone for help.  Wash your feet with warm water (do not use hot water) and mild soap. Then pat your feet and the areas between your toes until they are completely dry. Do not soak your feet as this can dry your skin.  Apply a moisturizing lotion or petroleum jelly (that does not contain alcohol and is unscented) to the skin on your feet and to dry, brittle toenails. Do not apply lotion between your toes.  Trim your toenails straight across. Do not dig under them or around the cuticle. File the edges of your nails with an emery board or nail file.  Do not cut corns or calluses or try to remove them with medicine.  Wear clean socks or stockings every day. Make sure they are not too tight. Do not wear knee-high stockings since they may decrease blood flow to your legs.  Wear shoes that fit properly and have enough cushioning. To break in new shoes, wear them for just a few hours a day. This prevents you from  injuring your feet. Always look in your shoes before you put them on to be sure there are no objects inside.  Do not cross your legs. This may decrease the blood flow to your feet.  If you find a minor scrape, cut, or break in the skin on your feet, keep it and the skin around it clean and dry. These areas may be cleansed with mild soap and water. Do not cleanse the area with peroxide, alcohol, or iodine.  When you remove an adhesive bandage, be sure not to damage the skin around it.  If you have a wound, look at it several times a day to make sure it is healing.  Do not use heating pads or hot water bottles. They may burn your skin. If you have lost feeling in your feet or legs, you may not know it is happening until it is too late.  Make sure your health care provider performs a complete foot exam at least annually or more often if you have foot problems. Report any cuts, sores, or bruises to your health care provider immediately. SEEK MEDICAL CARE IF:   You have an injury that is not healing.  You have cuts or breaks in the skin.  You have an ingrown nail.  You notice redness on your legs or feet.  You feel burning or tingling in your legs or feet.  You have pain  or cramps in your legs and feet.  Your legs or feet are numb.  Your feet always feel cold. SEEK IMMEDIATE MEDICAL CARE IF:   There is increasing redness, swelling, or pain in or around a wound.  There is a red line that goes up your leg.  Pus is coming from a wound.  You develop a fever or as directed by your health care provider.  You notice a bad smell coming from an ulcer or wound.   This information is not intended to replace advice given to you by your health care provider. Make sure you discuss any questions you have with your health care provider.   Document Released: 01/09/2000 Document Revised: 09/13/2012 Document Reviewed: 06/20/2012 Elsevier Interactive Patient Education [employer].

## 2015-11-26 NOTE — Progress Notes (Signed)
   Subjective:    Patient ID: Sheila Pierce, female    DOB: Feb 15, 1945, 70 y.o.   MRN: [id number]  HPI     This patient presents today with her son and social worker present in the treatment room. The patient is complaining of extremely thickened toenails which are uncomfortable with direct shoe wearing an request toenail debridement. The nails have become progressively thickened and elongated over a multiple month period of time. She is not able to trim your toenails and denies any professional care Patient is diabetic and denies any history of foot ulceration, claudication or amputation Patient currently not smoking previous smoker  Review of Systems  All other systems reviewed and are negative.      Objective:   Physical Exam  Patient appears orientated 3  Vascular: No peripheral edema or calf tenderness bilaterally DP pulses 1/4 bilaterally PT pulses 2/4 bilaterally Capillary reflex delay bilaterally  Neurological: Sensation to 10 g monofilament wire intact 4/5 bilaterally Vibratory sensation reactive bilaterally Ankle reflex equal reactive bilaterally  Dermatological: No open skin lesions bilaterally The toenails are elongated, brittle, deformed, discolored and tender to direct palpation 6-10  Musculoskeletal: Patient transfer from wheelchair to treatment chair Hammertoes fifth bilaterally Living second third toes bilaterally Manual motor testing dorsi flexion, plantar flexion, inversion, eversion 5/5 bilaterally       Assessment & Plan:   Assessment: Diabetic with satisfactory neurovascular status Neglected symptomatic onychomycoses 6-10  Plan: I reviewed the results exam with patient and patient's son and social worker today and recommended mechanical debridement of toenails. The patient verbally consents The toenails 6-10 were debrided mechanically analytic without any bleeding  Reappoint 3 months

## 2015-11-27 DIAGNOSIS — J440 Chronic obstructive pulmonary disease with acute lower respiratory infection: Secondary | ICD-10-CM | POA: Diagnosis not present

## 2015-11-28 DIAGNOSIS — J440 Chronic obstructive pulmonary disease with acute lower respiratory infection: Secondary | ICD-10-CM | POA: Diagnosis not present

## 2015-11-29 DIAGNOSIS — J440 Chronic obstructive pulmonary disease with acute lower respiratory infection: Secondary | ICD-10-CM | POA: Diagnosis not present

## 2015-11-29 DIAGNOSIS — R69 Illness, unspecified: Secondary | ICD-10-CM | POA: Diagnosis not present

## 2015-11-30 DIAGNOSIS — R69 Illness, unspecified: Secondary | ICD-10-CM | POA: Diagnosis not present

## 2015-11-30 DIAGNOSIS — J440 Chronic obstructive pulmonary disease with acute lower respiratory infection: Secondary | ICD-10-CM | POA: Diagnosis not present

## 2015-12-01 ENCOUNTER — Encounter: Payer: Self-pay | Admitting: Family Medicine

## 2015-12-01 DIAGNOSIS — Z5320 Procedure and treatment not carried out because of patient's decision for unspecified reasons: Secondary | ICD-10-CM | POA: Insufficient documentation

## 2015-12-01 DIAGNOSIS — M810 Age-related osteoporosis without current pathological fracture: Secondary | ICD-10-CM | POA: Insufficient documentation

## 2015-12-01 DIAGNOSIS — J440 Chronic obstructive pulmonary disease with acute lower respiratory infection: Secondary | ICD-10-CM | POA: Diagnosis not present

## 2015-12-02 DIAGNOSIS — R69 Illness, unspecified: Secondary | ICD-10-CM | POA: Diagnosis not present

## 2015-12-02 DIAGNOSIS — J440 Chronic obstructive pulmonary disease with acute lower respiratory infection: Secondary | ICD-10-CM | POA: Diagnosis not present

## 2015-12-03 DIAGNOSIS — J440 Chronic obstructive pulmonary disease with acute lower respiratory infection: Secondary | ICD-10-CM | POA: Diagnosis not present

## 2015-12-04 DIAGNOSIS — J440 Chronic obstructive pulmonary disease with acute lower respiratory infection: Secondary | ICD-10-CM | POA: Diagnosis not present

## 2015-12-05 DIAGNOSIS — J440 Chronic obstructive pulmonary disease with acute lower respiratory infection: Secondary | ICD-10-CM | POA: Diagnosis not present

## 2015-12-06 DIAGNOSIS — J440 Chronic obstructive pulmonary disease with acute lower respiratory infection: Secondary | ICD-10-CM | POA: Diagnosis not present

## 2015-12-07 DIAGNOSIS — J440 Chronic obstructive pulmonary disease with acute lower respiratory infection: Secondary | ICD-10-CM | POA: Diagnosis not present

## 2015-12-09 DIAGNOSIS — J440 Chronic obstructive pulmonary disease with acute lower respiratory infection: Secondary | ICD-10-CM | POA: Diagnosis not present

## 2015-12-09 DIAGNOSIS — R32 Unspecified urinary incontinence: Secondary | ICD-10-CM | POA: Diagnosis not present

## 2015-12-10 DIAGNOSIS — J440 Chronic obstructive pulmonary disease with acute lower respiratory infection: Secondary | ICD-10-CM | POA: Diagnosis not present

## 2015-12-11 DIAGNOSIS — J440 Chronic obstructive pulmonary disease with acute lower respiratory infection: Secondary | ICD-10-CM | POA: Diagnosis not present

## 2015-12-12 ENCOUNTER — Encounter: Payer: Self-pay | Admitting: Family Medicine

## 2015-12-12 DIAGNOSIS — J449 Chronic obstructive pulmonary disease, unspecified: Secondary | ICD-10-CM | POA: Diagnosis not present

## 2015-12-12 DIAGNOSIS — J9610 Chronic respiratory failure, unspecified whether with hypoxia or hypercapnia: Secondary | ICD-10-CM | POA: Diagnosis not present

## 2015-12-12 DIAGNOSIS — J440 Chronic obstructive pulmonary disease with acute lower respiratory infection: Secondary | ICD-10-CM | POA: Diagnosis not present

## 2015-12-13 DIAGNOSIS — J440 Chronic obstructive pulmonary disease with acute lower respiratory infection: Secondary | ICD-10-CM | POA: Diagnosis not present

## 2015-12-14 DIAGNOSIS — J440 Chronic obstructive pulmonary disease with acute lower respiratory infection: Secondary | ICD-10-CM | POA: Diagnosis not present

## 2015-12-15 ENCOUNTER — Ambulatory Visit: Payer: Self-pay | Admitting: Family Medicine

## 2015-12-15 DIAGNOSIS — J440 Chronic obstructive pulmonary disease with acute lower respiratory infection: Secondary | ICD-10-CM | POA: Diagnosis not present

## 2015-12-16 ENCOUNTER — Encounter: Payer: Self-pay | Admitting: "Endocrinology

## 2015-12-16 ENCOUNTER — Encounter: Payer: Self-pay | Admitting: Family Medicine

## 2015-12-16 ENCOUNTER — Ambulatory Visit (INDEPENDENT_AMBULATORY_CARE_PROVIDER_SITE_OTHER): Payer: Medicare HMO | Admitting: "Endocrinology

## 2015-12-16 VITALS — BP 149/79 | HR 87 | Ht 60.0 in | Wt 139.0 lb

## 2015-12-16 DIAGNOSIS — E7800 Pure hypercholesterolemia, unspecified: Secondary | ICD-10-CM | POA: Diagnosis not present

## 2015-12-16 DIAGNOSIS — J440 Chronic obstructive pulmonary disease with acute lower respiratory infection: Secondary | ICD-10-CM | POA: Diagnosis not present

## 2015-12-16 DIAGNOSIS — E119 Type 2 diabetes mellitus without complications: Secondary | ICD-10-CM

## 2015-12-16 HISTORY — DX: Pure hypercholesterolemia, unspecified: E78.00

## 2015-12-16 NOTE — Progress Notes (Signed)
Subjective:    Patient ID: Sheila Pierce, female    DOB: [birth year]. Patient is being seen in consultation for management of diabetes requested by  [name], MD  Past Medical History:  Diagnosis Date  . Allergy   . Anemia   . Anxiety   . Arthritis   . Asthma   . Cataract   . Chronic kidney disease   . COPD (chronic obstructive pulmonary disease) (HCC)    Chronic resp failure on home O2  . Depression   . Diabetes mellitus without complication (HCC)   . Emphysema of lung (HCC)   . GERD (gastroesophageal reflux disease)   . Hypercholesteremia 12/16/2015  . Hyperglycemia    Noted 09/2011 admission in setting of steroid use with normal HgbA1C.  Marland Kitchen Hypertension   . Hypokalemia   . Hyponatremia    Noted 09/2011 admission.  . NSTEMI (non-ST elevated myocardial infarction) (HCC)    a. 09/2011 in setting of acute on chronic resp failure/COPD exacerbation --> cath demonstrated nonobstructive CAD 10/05/11 with EF 50%;  08/2012 elevated Ti in setting of COPD flare -->Echo: EF 60-65%, Gr 1 DD -->Med Rx.  . Osteoporosis   . Oxygen deficiency   . QT prolongation    Noted on EKG 09/2011 (590 in setting of K of 3, improved to 475 by discharge)  . Tobacco abuse 09/19/2012  . Trigeminal neuralgia    Past Surgical History:  Procedure Laterality Date  . ABDOMINAL HYSTERECTOMY     bleeding  . CHOLECYSTECTOMY    . LEFT HEART CATHETERIZATION WITH CORONARY ANGIOGRAM N/A 10/05/2011   Procedure: LEFT HEART CATHETERIZATION WITH CORONARY ANGIOGRAM;  Surgeon: Tonny Bollman, MD;  Location: [facility] CATH LAB;  Service: Cardiovascular;  Laterality: N/A;  . PLANTAR'S WART EXCISION     Social History   Social History  . Marital status: Unknown    Spouse name: N/A  . Number of children: 3  . Years of education: 6   Occupational History  . retired    Social History Main Topics  . Smoking status: Former Smoker    Packs/day: 1.00    Years: 40.00    Types: Cigarettes    Start date: 01/26/1964     Quit date: 10/2014  . Smokeless tobacco: Never Used     Comment: 1 pack every 3 days  . Alcohol use No  . Drug use: No  . Sexual activity: Not Currently   Other Topics Concern  . None   Social History Narrative   Lives with 2 adult sons   Outpatient Encounter Prescriptions as of 12/16/2015  Medication Sig  . acetaZOLAMIDE (DIAMOX) 250 MG tablet Take 1 tablet (250 mg total) by mouth 2 (two) times daily.  Marland Kitchen albuterol (PROVENTIL HFA;VENTOLIN HFA) 108 (90 Base) MCG/ACT inhaler Inhale 2 puffs into the lungs every 4 (four) hours as needed for wheezing or shortness of breath.  Marland Kitchen aspirin 81 MG tablet Take 81 mg by mouth daily.  . beclomethasone (QVAR) 40 MCG/ACT inhaler Inhale 1 puff into the lungs 2 (two) times daily.  Marland Kitchen BREO ELLIPTA 100-25 MCG/INH AEPB Inhale 1 puff into the lungs daily.  . busPIRone (BUSPAR) 15 MG tablet Take 1 tablet (15 mg total) by mouth 2 (two) times daily.  . Cholecalciferol 1000 units capsule Take 1,000 Units by mouth daily.   Marland Kitchen doxycycline (VIBRA-TABS) 100 MG tablet Take 1 tablet (100 mg total) by mouth 2 (two) times daily.  Marland Kitchen guaiFENesin-dextromethorphan (ROBITUSSIN DM) 100-10 MG/5ML syrup Take  5 mLs by mouth every 4 (four) hours as needed for cough.  Marland Kitchen ipratropium-albuterol (DUONEB) 0.5-2.5 (3) MG/3ML SOLN Take 3 mLs by nebulization every 4 (four) hours.  Marland Kitchen lisinopril-hydrochlorothiazide (PRINZIDE,ZESTORETIC) 10-12.5 MG tablet Take 1 tablet by mouth daily.  . metoprolol tartrate (LOPRESSOR) 25 MG tablet Take 1 tablet (25 mg total) by mouth 2 (two) times daily.  . Multiple Vitamin (MULTIVITAMIN WITH MINERALS) TABS tablet Take 1 tablet by mouth daily.  . potassium chloride (K-DUR) 10 MEQ tablet Take 1 tablet (10 mEq total) by mouth daily.  . predniSONE (DELTASONE) 10 MG tablet Starting on 11/09/15, take 6 tablets daily for 1 day; then 5 tablets the next day; then 4 tablets the next day; then 3 tablets the next day; then 2 tablets the next day; then 1 tablet the next  day; then stop.  . predniSONE (DELTASONE) 10 MG tablet Take 1 tablet (10 mg total) by mouth 2 (two) times daily with a meal.  . traMADol (ULTRAM) 50 MG tablet Take 1 tablet (50 mg total) by mouth every 6 (six) hours as needed for moderate pain or severe pain.  . [DISCONTINUED] insulin regular (NOVOLIN R,HUMULIN R) 100 units/mL injection Inject 0.01-0.1 mLs (1-10 Units total) into the skin 3 (three) times daily before meals. Per sliding scale instructions::: 0-150= 0 units 151-200= 2 units 201-250= 4 units 251-300= 6 units 301-350= 8 units 351-400=10units   No facility-administered encounter medications on file as of 12/16/2015.    ALLERGIES: Allergies  Allergen Reactions  . Penicillins Swelling  . Sulfa Antibiotics Hives    Hallucinations   . Codeine Nausea Only  . Hydrocodone Nausea Only   VACCINATION STATUS: Immunization History  Administered Date(s) Administered  . Influenza Split 10/06/2011  . Pneumococcal Conjugate-13 11/11/2015  . Tdap 11/11/2015    Diabetes  She presents for her initial diabetic visit. She has type 2 diabetes mellitus. Onset time: She states she was diagnosed last year at age 70. Her disease course has been stable. There are no hypoglycemic associated symptoms. Pertinent negatives for hypoglycemia include no confusion, headaches, pallor or seizures. There are no diabetic associated symptoms. Pertinent negatives for diabetes include no chest pain, no polydipsia, no polyphagia and no polyuria. There are no hypoglycemic complications. Symptoms are stable. There are no diabetic complications. Risk factors for coronary artery disease include diabetes mellitus, dyslipidemia, obesity, hypertension, sedentary lifestyle and tobacco exposure. Current diabetic treatment includes insulin injections. Her weight is increasing steadily. She is following a generally unhealthy diet. When asked about meal planning, she reported none. She has not had a previous visit with a  dietitian. Her breakfast blood glucose range is generally 130-140 mg/dl. Her overall blood glucose range is 130-140 mg/dl. An ACE inhibitor/angiotensin II receptor blocker is being taken.  Hyperlipidemia  This is a chronic problem. The current episode started more than 1 year ago. The problem is controlled. Pertinent negatives include no chest pain, myalgias or shortness of breath. She is currently on no antihyperlipidemic treatment. Risk factors for coronary artery disease include diabetes mellitus, dyslipidemia, hypertension, obesity and a sedentary lifestyle.  Hypertension  This is a chronic problem. The current episode started more than 1 year ago. The problem is uncontrolled. Pertinent negatives include no chest pain, headaches, palpitations or shortness of breath. Risk factors for coronary artery disease include smoking/tobacco exposure, sedentary lifestyle, obesity, dyslipidemia and diabetes mellitus. Past treatments include ACE inhibitors and diuretics.       Review of Systems  Constitutional: Negative for chills, fever and  unexpected weight change.  HENT: Negative for trouble swallowing and voice change.   Eyes: Negative for visual disturbance.  Respiratory: Negative for cough, shortness of breath and wheezing.   Cardiovascular: Negative for chest pain, palpitations and leg swelling.  Gastrointestinal: Negative for diarrhea, nausea and vomiting.  Endocrine: Negative for cold intolerance, heat intolerance, polydipsia, polyphagia and polyuria.  Musculoskeletal: Positive for back pain and gait problem. Negative for arthralgias and myalgias.       Patient is wheelchair-bound.  Skin: Negative for color change, pallor, rash and wound.  Neurological: Negative for seizures and headaches.  Psychiatric/Behavioral: Negative for confusion and suicidal ideas.    Objective:    BP (!) 149/79   Pulse 87   Ht 5' (1.524 m)   Wt 139 lb (63 kg)   BMI 27.15 kg/m   Wt Readings from Last 3  Encounters:  12/16/15 139 lb (63 kg)  11/11/15 138 lb (62.6 kg)  11/11/15 138 lb 1.9 oz (62.7 kg)    Physical Exam  Constitutional: She is oriented to person, place, and time. She appears well-developed.  Agent is wheelchair-bound due to deconditioning and advancing COPD. She is also on oxygen per nasal cannula.  HENT:  Head: Normocephalic and atraumatic.  Eyes: EOM are normal.  Neck: Normal range of motion. Neck supple. No tracheal deviation present. No thyromegaly present.  Cardiovascular: Normal rate and regular rhythm.   Pulmonary/Chest: Effort normal. She has wheezes. She has rales.  Uses oxygen per nasal cannula.  Abdominal: Soft. Bowel sounds are normal. There is no tenderness. There is no guarding.  Musculoskeletal: Normal range of motion. She exhibits no edema.  Neurological: She is alert and oriented to person, place, and time. She has normal reflexes. No cranial nerve deficit. Coordination normal.  Skin: Skin is warm and dry. No rash noted. No erythema. No pallor.  Psychiatric: She has a normal mood and affect. Judgment normal.     CMP     Component Value Date/Time   NA 138 11/07/2015 0620   K 3.8 11/07/2015 0620   CL 105 11/07/2015 0620   CO2 28 11/07/2015 0620   GLUCOSE 109 (H) 11/07/2015 0620   BUN 31 (H) 11/07/2015 0620   CREATININE 0.94 11/07/2015 0620   CALCIUM 9.4 11/07/2015 0620   PROT 5.6 (L) 10/05/2011 0143   ALBUMIN 2.9 (L) 10/05/2011 0143   AST 20 10/05/2011 0143   ALT 17 10/05/2011 0143   ALKPHOS 51 10/05/2011 0143   BILITOT 0.2 (L) 10/05/2011 0143   GFRNONAA 60 (L) 11/07/2015 0620   GFRAA >60 11/07/2015 0620     Diabetic Labs (most recent): Lab Results  Component Value Date   HGBA1C 5.3 11/05/2015   HGBA1C 5.6 10/05/2011     Lipid Panel ( most recent) Lipid Panel     Component Value Date/Time   CHOL 158 10/05/2011 0143   TRIG 75 10/05/2011 0143   HDL 54 10/05/2011 0143   CHOLHDL 2.9 10/05/2011 0143   VLDL 15 10/05/2011 0143    LDLCALC 89 10/05/2011 0143      Assessment & Plan:   1. Diabetes mellitus, stable (HCC) - Patient has  recently diagnosed asymptomatic  type 2 DM .  her most recent A1c of 5.3  %- assistant with tight control.  - Reviewing her prior records, she never had significantly elevated glycemic profile nor A1c more than 6.5%. Recent labs reviewed. - The 10 mg prednisone daily she is taking daily for COPD clearly is a risk for type  2 diabetes, as well as adrenal insufficiency if it is stopped suddenly.  - Patient has several severe comorbidities including advanced COPD on oxygen supplement however she does not report direct gross diabetes complications. -  Nevertheless, Mrs. Hrivnak  remains at a high risk for more acute and chronic complications of diabetes which include CAD, CVA, CKD, retinopathy, and neuropathy. These are all discussed in detail with the patient.  - I have counseled the patient on diet management and weight loss, by adopting a carbohydrate restricted/protein rich diet.  - Suggestion is made for patient to avoid simple carbohydrates   from their diet including Cakes , Desserts, Ice Cream,  Soda (  diet and regular) , Sweet Tea , Candies,  Chips, Cookies, Artificial Sweeteners,   and "Sugar-free" Products . This will help patient to have stable blood glucose profile and potentially avoid unintended weight gain.  - I encouraged the patient to switch to  unprocessed or minimally processed complex starch and increased protein intake (animal or plant source), fruits, and vegetables.  - Patient is advised to stick to a routine mealtimes to eat 3 meals  a day and avoid unnecessary snacks ( to snack only to correct hypoglycemia).  - The patient will be scheduled with Norm Salt, RDN, CDE for individualized DM education.  - I have approached patient with the following individualized plan to manage diabetes and patient agrees:   - I  will proceed to initiate  strict monitoring of  glucose   4 times a day (before meals 3 times a day and daily at bedtime),  and return in 2 weeks with meter and logs for reevaluation.  - her accompanying meter shows random readings averaging 143 for the last month. - Patient is advised to stop insulin.  - If she requires therapy, she will be considered for low-dose metformin, or  SGLT2 inhibitors instead of insulin.   -Patient is encouraged to call clinic for blood glucose levels less than 70 or above 300 mg /dl.  - Patient specific target  A1c;  LDL, HDL, Triglycerides, and  Waist Circumference were discussed in detail.  2) BP/HTN:  uncontrolled .Continue current medications including ACEI/ARB. 3) Lipids/HPL:   Control unknown, no recent lipid panel. She is not on statins. I will obtain fasting lipid panel on subsequent labs.   4)  Weight/Diet: CDE Consult will be initiated ,,  she is limited on her exercise abilities, detailed carbohydrates information provided.  5) Chronic Care/Health Maintenance:  -Patient is on ACEI/ARB  medications and encouraged to continue to follow up with Ophthalmology, Podiatrist at least yearly or according to recommendations, and advised to  stay away from smoking ( she has 60+ pack year history of smoking). I have recommended yearly flu vaccine and pneumonia vaccination at least every 5 years;  and  sleep for at least 7 hours a day.  - 60 minutes of time was spent on the care of this patient , 50% of which was applied for counseling on diabetes complications and their preventions.  - Patient to bring meter and  blood glucose logs during their next visit.   - I advised patient to maintain close follow up with [name], MD for primary care needs.  Follow up plan: - Return in about 2 weeks (around 12/30/2015) for follow up with meter and logs- no labs.  Marquis Lunch, MD Phone: [phone]  Fax: [phone]   12/16/2015, 3:13 PM

## 2015-12-17 DIAGNOSIS — J440 Chronic obstructive pulmonary disease with acute lower respiratory infection: Secondary | ICD-10-CM | POA: Diagnosis not present

## 2015-12-18 DIAGNOSIS — J440 Chronic obstructive pulmonary disease with acute lower respiratory infection: Secondary | ICD-10-CM | POA: Diagnosis not present

## 2015-12-19 ENCOUNTER — Emergency Department (HOSPITAL_COMMUNITY): Payer: Medicare HMO

## 2015-12-19 ENCOUNTER — Encounter (HOSPITAL_COMMUNITY): Payer: Self-pay | Admitting: *Deleted

## 2015-12-19 ENCOUNTER — Inpatient Hospital Stay (HOSPITAL_COMMUNITY)
Admission: EM | Admit: 2015-12-19 | Discharge: 2015-12-22 | DRG: 190 | Disposition: A | Discharge status: Home / Self Care | Payer: Medicare HMO | Attending: Internal Medicine | Admitting: Internal Medicine

## 2015-12-19 ENCOUNTER — Other Ambulatory Visit: Payer: Self-pay | Admitting: "Endocrinology

## 2015-12-19 DIAGNOSIS — Z23 Encounter for immunization: Secondary | ICD-10-CM

## 2015-12-19 DIAGNOSIS — Z800 Family history of malignant neoplasm of digestive organs: Secondary | ICD-10-CM

## 2015-12-19 DIAGNOSIS — J440 Chronic obstructive pulmonary disease with acute lower respiratory infection: Secondary | ICD-10-CM | POA: Diagnosis not present

## 2015-12-19 DIAGNOSIS — J9621 Acute and chronic respiratory failure with hypoxia: Secondary | ICD-10-CM

## 2015-12-19 DIAGNOSIS — Z885 Allergy status to narcotic agent status: Secondary | ICD-10-CM

## 2015-12-19 DIAGNOSIS — Z7982 Long term (current) use of aspirin: Secondary | ICD-10-CM

## 2015-12-19 DIAGNOSIS — J449 Chronic obstructive pulmonary disease, unspecified: Secondary | ICD-10-CM | POA: Diagnosis present

## 2015-12-19 DIAGNOSIS — Z841 Family history of disorders of kidney and ureter: Secondary | ICD-10-CM

## 2015-12-19 DIAGNOSIS — Z79899 Other long term (current) drug therapy: Secondary | ICD-10-CM

## 2015-12-19 DIAGNOSIS — Z882 Allergy status to sulfonamides status: Secondary | ICD-10-CM

## 2015-12-19 DIAGNOSIS — J42 Unspecified chronic bronchitis: Secondary | ICD-10-CM | POA: Diagnosis not present

## 2015-12-19 DIAGNOSIS — J441 Chronic obstructive pulmonary disease with (acute) exacerbation: Secondary | ICD-10-CM | POA: Diagnosis not present

## 2015-12-19 DIAGNOSIS — Z8249 Family history of ischemic heart disease and other diseases of the circulatory system: Secondary | ICD-10-CM

## 2015-12-19 DIAGNOSIS — Z880 Allergy status to penicillin: Secondary | ICD-10-CM

## 2015-12-19 DIAGNOSIS — E119 Type 2 diabetes mellitus without complications: Secondary | ICD-10-CM | POA: Diagnosis not present

## 2015-12-19 DIAGNOSIS — I10 Essential (primary) hypertension: Secondary | ICD-10-CM | POA: Diagnosis present

## 2015-12-19 DIAGNOSIS — Z9981 Dependence on supplemental oxygen: Secondary | ICD-10-CM

## 2015-12-19 DIAGNOSIS — Z7952 Long term (current) use of systemic steroids: Secondary | ICD-10-CM

## 2015-12-19 DIAGNOSIS — I2510 Atherosclerotic heart disease of native coronary artery without angina pectoris: Secondary | ICD-10-CM | POA: Diagnosis present

## 2015-12-19 DIAGNOSIS — K219 Gastro-esophageal reflux disease without esophagitis: Secondary | ICD-10-CM | POA: Diagnosis present

## 2015-12-19 DIAGNOSIS — Z87891 Personal history of nicotine dependence: Secondary | ICD-10-CM

## 2015-12-19 DIAGNOSIS — E7800 Pure hypercholesterolemia, unspecified: Secondary | ICD-10-CM | POA: Diagnosis present

## 2015-12-19 DIAGNOSIS — M810 Age-related osteoporosis without current pathological fracture: Secondary | ICD-10-CM | POA: Diagnosis present

## 2015-12-19 DIAGNOSIS — R0682 Tachypnea, not elsewhere classified: Secondary | ICD-10-CM | POA: Diagnosis not present

## 2015-12-19 DIAGNOSIS — I482 Chronic atrial fibrillation: Secondary | ICD-10-CM | POA: Diagnosis not present

## 2015-12-19 DIAGNOSIS — Z801 Family history of malignant neoplasm of trachea, bronchus and lung: Secondary | ICD-10-CM

## 2015-12-19 DIAGNOSIS — R0602 Shortness of breath: Secondary | ICD-10-CM | POA: Diagnosis not present

## 2015-12-19 DIAGNOSIS — I252 Old myocardial infarction: Secondary | ICD-10-CM

## 2015-12-19 LAB — COMPREHENSIVE METABOLIC PANEL
ALK PHOS: 56 U/L (ref 38–126)
ALT: 16 U/L (ref 14–54)
ANION GAP: 8 (ref 5–15)
AST: 17 U/L (ref 15–41)
Albumin: 3.7 g/dL (ref 3.5–5.0)
BILIRUBIN TOTAL: 0.3 mg/dL (ref 0.3–1.2)
BUN: 17 mg/dL (ref 6–20)
CALCIUM: 9.4 mg/dL (ref 8.9–10.3)
CO2: 30 mmol/L (ref 22–32)
Chloride: 99 mmol/L — ABNORMAL LOW (ref 101–111)
Creatinine, Ser: 1 mg/dL (ref 0.44–1.00)
GFR calc non Af Amer: 55 mL/min — ABNORMAL LOW (ref >60)
Glucose, Bld: 156 mg/dL — ABNORMAL HIGH (ref 65–99)
Potassium: 3.1 mmol/L — ABNORMAL LOW (ref 3.5–5.1)
SODIUM: 137 mmol/L (ref 135–145)
TOTAL PROTEIN: 6.8 g/dL (ref 6.5–8.1)

## 2015-12-19 LAB — CBC WITH DIFFERENTIAL/PLATELET
Basophils Absolute: 0 10*3/uL (ref 0.0–0.1)
Basophils Relative: 0 %
EOS ABS: 0.1 10*3/uL (ref 0.0–0.7)
Eosinophils Relative: 2 %
HEMATOCRIT: 33.5 % — AB (ref 36.0–46.0)
HEMOGLOBIN: 10.1 g/dL — AB (ref 12.0–15.0)
LYMPHS ABS: 1.1 10*3/uL (ref 0.7–4.0)
Lymphocytes Relative: 20 %
MCH: 27.9 pg (ref 26.0–34.0)
MCHC: 30.1 g/dL (ref 30.0–36.0)
MCV: 92.5 fL (ref 78.0–100.0)
MONOS PCT: 6 %
Monocytes Absolute: 0.3 10*3/uL (ref 0.1–1.0)
NEUTROS PCT: 72 %
Neutro Abs: 4.2 10*3/uL (ref 1.7–7.7)
Platelets: 195 10*3/uL (ref 150–400)
RBC: 3.62 MIL/uL — ABNORMAL LOW (ref 3.87–5.11)
RDW: 14.3 % (ref 11.5–15.5)
WBC: 5.7 10*3/uL (ref 4.0–10.5)

## 2015-12-19 LAB — I-STAT TROPONIN, ED: TROPONIN I, POC: 0.01 ng/mL (ref 0.00–0.08)

## 2015-12-19 LAB — GLUCOSE, CAPILLARY: GLUCOSE-CAPILLARY: 264 mg/dL — AB (ref 65–99)

## 2015-12-19 LAB — BRAIN NATRIURETIC PEPTIDE: B NATRIURETIC PEPTIDE 5: 25 pg/mL (ref 0.0–100.0)

## 2015-12-19 MED ORDER — GUAIFENESIN ER 600 MG PO TB12
600.0000 mg | ORAL_TABLET | Freq: Two times a day (BID) | ORAL | Status: DC
  Administered 2015-12-19 – 2015-12-22 (×6): 600 mg via ORAL
  Filled 2015-12-19 (×6): qty 1

## 2015-12-19 MED ORDER — DOXYCYCLINE HYCLATE 100 MG PO TABS
100.0000 mg | ORAL_TABLET | Freq: Two times a day (BID) | ORAL | Status: DC
  Administered 2015-12-19 – 2015-12-22 (×7): 100 mg via ORAL
  Filled 2015-12-19 (×7): qty 1

## 2015-12-19 MED ORDER — ALBUTEROL SULFATE (2.5 MG/3ML) 0.083% IN NEBU
2.5000 mg | INHALATION_SOLUTION | Freq: Once | RESPIRATORY_TRACT | Status: AC
  Administered 2015-12-19: 2.5 mg via RESPIRATORY_TRACT
  Filled 2015-12-19: qty 3

## 2015-12-19 MED ORDER — ACETAMINOPHEN 650 MG RE SUPP: 650.0000 mg | Freq: Four times a day (QID) | RECTAL | Status: DC | PRN

## 2015-12-19 MED ORDER — ALBUTEROL SULFATE (2.5 MG/3ML) 0.083% IN NEBU
2.5000 mg | INHALATION_SOLUTION | RESPIRATORY_TRACT | Status: DC | PRN
  Administered 2015-12-19: 2.5 mg via RESPIRATORY_TRACT
  Filled 2015-12-19: qty 3

## 2015-12-19 MED ORDER — ACETAZOLAMIDE 250 MG PO TABS
250.0000 mg | ORAL_TABLET | Freq: Two times a day (BID) | ORAL | Status: DC
  Administered 2015-12-19 – 2015-12-22 (×6): 250 mg via ORAL
  Filled 2015-12-19 (×10): qty 1

## 2015-12-19 MED ORDER — BUSPIRONE HCL 5 MG PO TABS
15.0000 mg | ORAL_TABLET | Freq: Two times a day (BID) | ORAL | Status: DC
  Administered 2015-12-19 – 2015-12-22 (×6): 15 mg via ORAL
  Filled 2015-12-19 (×6): qty 3

## 2015-12-19 MED ORDER — IPRATROPIUM-ALBUTEROL 0.5-2.5 (3) MG/3ML IN SOLN
3.0000 mL | Freq: Once | RESPIRATORY_TRACT | Status: AC
  Administered 2015-12-19: 3 mL via RESPIRATORY_TRACT
  Filled 2015-12-19: qty 3

## 2015-12-19 MED ORDER — PNEUMOCOCCAL VAC POLYVALENT 25 MCG/0.5ML IJ INJ
0.5000 mL | INJECTION | INTRAMUSCULAR | Status: AC
  Administered 2015-12-20: 0.5 mL via INTRAMUSCULAR
  Filled 2015-12-19: qty 0.5

## 2015-12-19 MED ORDER — LISINOPRIL 10 MG PO TABS
10.0000 mg | ORAL_TABLET | Freq: Every day | ORAL | Status: DC
  Administered 2015-12-19 – 2015-12-22 (×4): 10 mg via ORAL
  Filled 2015-12-19 (×4): qty 1

## 2015-12-19 MED ORDER — SODIUM CHLORIDE 0.9 % IV SOLN
INTRAVENOUS | Status: DC
  Administered 2015-12-19 – 2015-12-21 (×5): via INTRAVENOUS

## 2015-12-19 MED ORDER — ORAL CARE MOUTH RINSE
15.0000 mL | Freq: Two times a day (BID) | OROMUCOSAL | Status: DC
  Administered 2015-12-19 – 2015-12-22 (×6): 15 mL via OROMUCOSAL

## 2015-12-19 MED ORDER — DEXTROSE 5 % IV SOLN: 500.0000 mg | INTRAVENOUS | Status: DC

## 2015-12-19 MED ORDER — ACETAMINOPHEN 325 MG PO TABS
650.0000 mg | ORAL_TABLET | Freq: Four times a day (QID) | ORAL | Status: DC | PRN
  Administered 2015-12-21: 650 mg via ORAL
  Filled 2015-12-19: qty 2

## 2015-12-19 MED ORDER — METOPROLOL TARTRATE 25 MG PO TABS
25.0000 mg | ORAL_TABLET | Freq: Two times a day (BID) | ORAL | Status: DC
  Administered 2015-12-19 – 2015-12-22 (×6): 25 mg via ORAL
  Filled 2015-12-19 (×6): qty 1

## 2015-12-19 MED ORDER — METHYLPREDNISOLONE SODIUM SUCC 125 MG IJ SOLR
60.0000 mg | Freq: Four times a day (QID) | INTRAMUSCULAR | Status: DC
  Administered 2015-12-19 – 2015-12-21 (×8): 60 mg via INTRAVENOUS
  Filled 2015-12-19 (×8): qty 2

## 2015-12-19 MED ORDER — INSULIN ASPART 100 UNIT/ML ~~LOC~~ SOLN
0.0000 [IU] | Freq: Every day | SUBCUTANEOUS | Status: DC
  Administered 2015-12-19: 2 [IU] via SUBCUTANEOUS

## 2015-12-19 MED ORDER — BUDESONIDE 0.25 MG/2ML IN SUSP
0.2500 mg | Freq: Two times a day (BID) | RESPIRATORY_TRACT | Status: DC
  Administered 2015-12-19 – 2015-12-22 (×6): 0.25 mg via RESPIRATORY_TRACT
  Filled 2015-12-19 (×6): qty 2

## 2015-12-19 MED ORDER — INSULIN ASPART 100 UNIT/ML ~~LOC~~ SOLN
0.0000 [IU] | Freq: Three times a day (TID) | SUBCUTANEOUS | Status: DC
  Administered 2015-12-20 – 2015-12-21 (×4): 2 [IU] via SUBCUTANEOUS

## 2015-12-19 MED ORDER — HYDROCHLOROTHIAZIDE 12.5 MG PO CAPS
12.5000 mg | ORAL_CAPSULE | Freq: Every day | ORAL | Status: DC
  Administered 2015-12-19 – 2015-12-22 (×4): 12.5 mg via ORAL
  Filled 2015-12-19 (×4): qty 1

## 2015-12-19 MED ORDER — ASPIRIN 81 MG PO CHEW
81.0000 mg | CHEWABLE_TABLET | Freq: Every day | ORAL | Status: DC
  Administered 2015-12-19 – 2015-12-22 (×4): 81 mg via ORAL
  Filled 2015-12-19 (×4): qty 1

## 2015-12-19 MED ORDER — IPRATROPIUM BROMIDE 0.02 % IN SOLN: 0.5000 mg | Freq: Four times a day (QID) | RESPIRATORY_TRACT | Status: DC

## 2015-12-19 MED ORDER — LISINOPRIL-HYDROCHLOROTHIAZIDE 10-12.5 MG PO TABS: 1.0000 | ORAL_TABLET | Freq: Every day | ORAL | Status: DC

## 2015-12-19 MED ORDER — ALBUTEROL SULFATE (2.5 MG/3ML) 0.083% IN NEBU: 2.5000 mg | INHALATION_SOLUTION | Freq: Four times a day (QID) | RESPIRATORY_TRACT | Status: DC

## 2015-12-19 MED ORDER — ACETAZOLAMIDE 250 MG PO TABS
ORAL_TABLET | ORAL | Status: AC
  Filled 2015-12-19: qty 1

## 2015-12-19 MED ORDER — TRAMADOL HCL 50 MG PO TABS
50.0000 mg | ORAL_TABLET | Freq: Four times a day (QID) | ORAL | Status: DC | PRN
Start: 2015-12-19 — End: 2015-12-22
  Administered 2015-12-19 – 2015-12-22 (×3): 50 mg via ORAL
  Filled 2015-12-19 (×4): qty 1

## 2015-12-19 MED ORDER — ENOXAPARIN SODIUM 40 MG/0.4ML ~~LOC~~ SOLN
40.0000 mg | SUBCUTANEOUS | Status: DC
  Administered 2015-12-19 – 2015-12-21 (×3): 40 mg via SUBCUTANEOUS
  Filled 2015-12-19 (×3): qty 0.4

## 2015-12-19 MED ORDER — FLUTICASONE FUROATE-VILANTEROL 100-25 MCG/INH IN AEPB
1.0000 | INHALATION_SPRAY | Freq: Every day | RESPIRATORY_TRACT | Status: DC
  Administered 2015-12-20 – 2015-12-22 (×3): 1 via RESPIRATORY_TRACT
  Filled 2015-12-19: qty 28

## 2015-12-19 MED ORDER — POTASSIUM CHLORIDE CRYS ER 20 MEQ PO TBCR
40.0000 meq | EXTENDED_RELEASE_TABLET | Freq: Once | ORAL | Status: AC
  Administered 2015-12-19: 40 meq via ORAL
  Filled 2015-12-19: qty 2

## 2015-12-19 MED ORDER — METHYLPREDNISOLONE SODIUM SUCC 125 MG IJ SOLR: 125.0000 mg | Freq: Once | INTRAMUSCULAR | Status: DC

## 2015-12-19 MED ORDER — BECLOMETHASONE DIPROPIONATE 40 MCG/ACT IN AERS: 1.0000 | INHALATION_SPRAY | Freq: Two times a day (BID) | RESPIRATORY_TRACT | Status: DC

## 2015-12-19 MED ORDER — IPRATROPIUM-ALBUTEROL 0.5-2.5 (3) MG/3ML IN SOLN
3.0000 mL | Freq: Four times a day (QID) | RESPIRATORY_TRACT | Status: DC
  Administered 2015-12-19 – 2015-12-22 (×10): 3 mL via RESPIRATORY_TRACT
  Filled 2015-12-19 (×10): qty 3

## 2015-12-19 NOTE — ED Triage Notes (Signed)
Pt comes in by EMS from home for shortness of breath starting this morning. Pt has hx of COPD. Pt has been given albuterol tx, 125 solumedrol, and a duoneb. Pt has pursed lip breathing at this time. Pt alert and oriented.

## 2015-12-19 NOTE — ED Notes (Signed)
Resp tx notified of orders 

## 2015-12-19 NOTE — ED Provider Notes (Signed)
[facility]-EMERGENCY DEPT Provider Note   CSN: [id number] Arrival date & time: 12/19/15  1154 By signing my name below, I, Linus Galas, attest that this documentation has been prepared under the direction and in the presence of Bethann Berkshire, MD. Electronically Signed: Linus Galas, ED Scribe. 12/19/15. 12:21 PM.  History   Chief Complaint Chief Complaint  Patient presents with  . Shortness of Breath   The history is provided by the patient. No language interpreter was used.  Shortness of Breath  This is a new problem. The current episode started 1 to 2 hours ago. The problem has not changed since onset.Associated symptoms include cough. Pertinent negatives include no headaches, no chest pain, no abdominal pain and no rash. Risk factors include smoking. She has tried nothing for the symptoms. Associated medical issues include COPD and past MI.   HPI Comments: Sheila Pierce is a 70 y.o. female who presents to the Emergency Department via EMS with a PMHx of COPD and MI complaining of SOB that began today, PTA. Pt also reports productive cough with blood in her phlegm. She denies any aggravating or alleviating factors. Pt denies taking any medications for relief. Pt denies any fevers, chills, CP, HA, rash or any other symptoms at this time.   Past Medical History:  Diagnosis Date  . Allergy   . Anemia   . Anxiety   . Arthritis   . Asthma   . Cataract   . Chronic kidney disease   . COPD (chronic obstructive pulmonary disease) (HCC)    Chronic resp failure on home O2  . Depression   . Diabetes mellitus without complication (HCC)   . Emphysema of lung (HCC)   . GERD (gastroesophageal reflux disease)   . Hypercholesteremia 12/16/2015  . Hyperglycemia    Noted 09/2011 admission in setting of steroid use with normal HgbA1C.  Marland Kitchen Hypertension   . Hypokalemia   . Hyponatremia    Noted 09/2011 admission.  . NSTEMI (non-ST elevated myocardial infarction) (HCC)    a. 09/2011 in setting of  acute on chronic resp failure/COPD exacerbation --> cath demonstrated nonobstructive CAD 10/05/11 with EF 50%;  08/2012 elevated Ti in setting of COPD flare -->Echo: EF 60-65%, Gr 1 DD -->Med Rx.  . Osteoporosis   . Oxygen deficiency   . QT prolongation    Noted on EKG 09/2011 (590 in setting of K of 3, improved to 475 by discharge)  . Tobacco abuse 09/19/2012  . Trigeminal neuralgia    Patient Active Problem List   Diagnosis Date Noted  . Hypercholesteremia 12/16/2015  . Osteoporosis 12/01/2015  . Colonoscopy refused 12/01/2015  . Macular degeneration, age related, nonexudative 11/26/2015  . Cataract incipient, senile, bilateral 11/26/2015  . Onychomycosis of toenail 11/11/2015  . Aortic atherosclerosis (HCC) 11/11/2015  . Abnormal thyroid function test 11/06/2015  . Diabetes mellitus, stable (HCC) 11/05/2015  . COPD exacerbation (HCC) 11/04/2015  . Hyponatremia 11/03/2015  . AKI (acute kidney injury) (HCC) 11/03/2015  . Normocytic anemia 11/03/2015  . Anxiety 11/03/2015  . Acute exacerbation of chronic obstructive pulmonary disease (COPD) (HCC) 09/19/2012  . CAD (coronary artery disease) 11/24/2011  . Essential hypertension   . QT prolongation   . Hypokalemia   . MI, acute, non ST segment elevation (HCC) 10/05/2011   Past Surgical History:  Procedure Laterality Date  . ABDOMINAL HYSTERECTOMY     bleeding  . CHOLECYSTECTOMY    . LEFT HEART CATHETERIZATION WITH CORONARY ANGIOGRAM N/A 10/05/2011   Procedure: LEFT HEART CATHETERIZATION  WITH CORONARY ANGIOGRAM;  Surgeon: Tonny BollmanMichael Cooper, MD;  [facility];  Service: Cardiovascular;  Laterality: N/A;  . PLANTAR'S WART EXCISION     OB History    No data available     Home Medications    Prior to Admission medications   Medication Sig Start Date End Date Taking? Authorizing Provider  acetaZOLAMIDE (DIAMOX) 250 MG tablet Take 1 tablet (250 mg total) by mouth 2 (two) times daily. 11/11/15 02/09/16  Eustace MooreYvonne Sue Nelson, MD    albuterol (PROVENTIL HFA;VENTOLIN HFA) 108 (90 Base) MCG/ACT inhaler Inhale 2 puffs into the lungs every 4 (four) hours as needed for wheezing or shortness of breath. 11/11/15   Eustace MooreYvonne Sue Nelson, MD  aspirin 81 MG tablet Take 81 mg by mouth daily.    Historical Provider, MD  beclomethasone (QVAR) 40 MCG/ACT inhaler Inhale 1 puff into the lungs 2 (two) times daily. 11/11/15   Eustace MooreYvonne Sue Nelson, MD  BREO ELLIPTA 100-25 MCG/INH AEPB Inhale 1 puff into the lungs daily. 11/11/15   Eustace MooreYvonne Sue Nelson, MD  busPIRone (BUSPAR) 15 MG tablet Take 1 tablet (15 mg total) by mouth 2 (two) times daily. 11/11/15   Eustace MooreYvonne Sue Nelson, MD  Cholecalciferol 1000 units capsule Take 1,000 Units by mouth daily.     Historical Provider, MD  doxycycline (VIBRA-TABS) 100 MG tablet Take 1 tablet (100 mg total) by mouth 2 (two) times daily. 11/11/15   Eustace MooreYvonne Sue Nelson, MD  guaiFENesin-dextromethorphan (ROBITUSSIN DM) 100-10 MG/5ML syrup Take 5 mLs by mouth every 4 (four) hours as needed for cough. 11/08/15   Elliot Cousinenise Fisher, MD  ipratropium-albuterol (DUONEB) 0.5-2.5 (3) MG/3ML SOLN Take 3 mLs by nebulization every 4 (four) hours. 11/11/15   Eustace MooreYvonne Sue Nelson, MD  lisinopril-hydrochlorothiazide (PRINZIDE,ZESTORETIC) 10-12.5 MG tablet Take 1 tablet by mouth daily. 11/11/15   Eustace MooreYvonne Sue Nelson, MD  metoprolol tartrate (LOPRESSOR) 25 MG tablet Take 1 tablet (25 mg total) by mouth 2 (two) times daily. 11/11/15   Eustace MooreYvonne Sue Nelson, MD  Multiple Vitamin (MULTIVITAMIN WITH MINERALS) TABS tablet Take 1 tablet by mouth daily.    Historical Provider, MD  potassium chloride (K-DUR) 10 MEQ tablet Take 1 tablet (10 mEq total) by mouth daily. 11/11/15   Eustace MooreYvonne Sue Nelson, MD  predniSONE (DELTASONE) 10 MG tablet Take 1 tablet (10 mg total) by mouth 2 (two) times daily with a meal. 11/11/15   Eustace MooreYvonne Sue Nelson, MD  traMADol (ULTRAM) 50 MG tablet Take 1 tablet (50 mg total) by mouth every 6 (six) hours as needed for moderate pain or severe pain.  11/11/15   Eustace MooreYvonne Sue Nelson, MD   Family History Family History  Problem Relation Age of Onset  . Cancer Father     Lung  . Cancer Mother     Liver  . Hypertension Son   . Kidney disease Brother     liveer and kidney failure  . Cancer Other     colon   Social History Social History  Substance Use Topics  . Smoking status: Former Smoker    Packs/day: 1.00    Years: 40.00    Types: Cigarettes    Start date: 01/26/1964    Quit date: 10/2014  . Smokeless tobacco: Never Used     Comment: 1 pack every 3 days  . Alcohol use No   Allergies   Penicillins; Sulfa antibiotics; Codeine; and Hydrocodone  Review of Systems Review of Systems  Constitutional: Negative for appetite change and fatigue.  HENT: Negative for  congestion, ear discharge and sinus pressure.   Eyes: Negative for discharge.  Respiratory: Positive for cough and shortness of breath.   Cardiovascular: Negative for chest pain.  Gastrointestinal: Negative for abdominal pain and diarrhea.  Genitourinary: Negative for frequency and hematuria.  Musculoskeletal: Negative for back pain.  Skin: Negative for rash.  Neurological: Negative for seizures and headaches.  Psychiatric/Behavioral: Negative for hallucinations.   Physical Exam Updated Vital Signs BP 156/83   Pulse 117   Temp 98.2 F (36.8 C) (Oral)   Resp 21   Ht 5' (1.524 m)   Wt 130 lb (59 kg)   SpO2 98%   BMI 25.39 kg/m   Physical Exam  Constitutional: She is oriented to person, place, and time. She appears well-developed.  HENT:  Head: Normocephalic.  Eyes: Conjunctivae and EOM are normal. No scleral icterus.  Neck: Neck supple. No thyromegaly present.  Cardiovascular: Regular rhythm.  Tachycardia present.  Exam reveals no gallop and no friction rub.   No murmur heard. Pulmonary/Chest: No stridor. She has no rales. She exhibits no tenderness.  Decreased breath sounds throughout with mild wheezes.   Abdominal: She exhibits no distension. There is  no tenderness. There is no rebound.  Musculoskeletal: Normal range of motion. She exhibits no edema.  Lymphadenopathy:    She has no cervical adenopathy.  Neurological: She is oriented to person, place, and time. She exhibits normal muscle tone. Coordination normal.  Skin: No rash noted. No erythema.  Psychiatric: She has a normal mood and affect. Her behavior is normal.   ED Treatments / Results  DIAGNOSTIC STUDIES: Oxygen Saturation is 98% on room air, normal by my interpretation.    COORDINATION OF CARE: 12:22 PM Discussed treatment plan with pt at bedside and pt agreed to plan.  Labs (all labs ordered are listed, but only abnormal results are displayed) Labs Reviewed  CBC WITH DIFFERENTIAL/PLATELET  COMPREHENSIVE METABOLIC PANEL  BRAIN NATRIURETIC PEPTIDE  I-STAT TROPOININ, ED    EKG  EKG Interpretation None      Radiology No results found.  Procedures Procedures (including critical care time)  Medications Ordered in ED Medications  methylPREDNISolone sodium succinate (SOLU-MEDROL) 125 mg/2 mL injection 125 mg (not administered)  ipratropium-albuterol (DUONEB) 0.5-2.5 (3) MG/3ML nebulizer solution 3 mL (not administered)  albuterol (PROVENTIL) (2.5 MG/3ML) 0.083% nebulizer solution 2.5 mg (not administered)    Initial Impression / Assessment and Plan / ED Course  I have reviewed the triage vital signs and the nursing notes.  Pertinent labs & imaging results that were available during my care of the patient were reviewed by me and considered in my medical decision making (see chart for details).  Clinical Course    COPD exacerbation. Patient will be admitted to observation  Final Clinical Impressions(s) / ED Diagnoses   Final diagnoses:  None    New Prescriptions New Prescriptions   No medications on file   The chart was scribed for me under my direct supervision.  I personally performed the history, physical, and medical decision making and all  procedures in the evaluation of this patient.Bethann Berkshire, MD 12/19/15 [phone]

## 2015-12-19 NOTE — ED Notes (Signed)
Respiratory notified.

## 2015-12-19 NOTE — H&P (Signed)
History and Physical  Sheila Pierce BCW:[id number] DOB: 1945/04/29 DOA: 12/19/2015  Referring physician: Dr Estell Harpin, ED physician PCP: [name], MD  Outpatient Specialists:   Dr. Fransico Him (endocrine)  Chief Complaint: Shortness of breath  HPI: Sheila Pierce is a 70 y.o. female with a history of chronic respiratory failure with COPD, asthma, diabetes, GERD, CAD with history of NSTEMI in 2013, osteoporosis. Patient with 2-3 days of worsening shortness of breath and productive cough treat with blood. Breathing worse with exertion and improved with rest. Patient's been using her albuterol inhaler with some improvement although this is short-lived. Has been taking her albuterol inhaler every 4-5 hours.  Emergency Department Course: Patient presented by EMS and received Solu-Medrol 125 mg and a DuoNeb. In the emergency department, the patient received other limbs. Chest x-ray shows no infiltrate or edema.  Review of Systems:   Pt denies any fevers, chills, nausea, vomiting, diarrhea, constipation, abdominal pain, palpitations, headache, vision changes, lightheadedness, dizziness, melena, rectal bleeding.  Review of systems are otherwise negative  Past Medical History:  Diagnosis Date  . Allergy   . Anemia   . Anxiety   . Arthritis   . Asthma   . Cataract   . Chronic kidney disease   . COPD (chronic obstructive pulmonary disease) (HCC)    Chronic resp failure on home O2  . Depression   . Diabetes mellitus without complication (HCC)   . Emphysema of lung (HCC)   . GERD (gastroesophageal reflux disease)   . Hypercholesteremia 12/16/2015  . Hyperglycemia    Noted 09/2011 admission in setting of steroid use with normal HgbA1C.  Marland Kitchen Hypertension   . Hypokalemia   . Hyponatremia    Noted 09/2011 admission.  . NSTEMI (non-ST elevated myocardial infarction) (HCC)    a. 09/2011 in setting of acute on chronic resp failure/COPD exacerbation --> cath demonstrated nonobstructive CAD 10/05/11  with EF 50%;  08/2012 elevated Ti in setting of COPD flare -->Echo: EF 60-65%, Gr 1 DD -->Med Rx.  . Osteoporosis   . Oxygen deficiency   . QT prolongation    Noted on EKG 09/2011 (590 in setting of K of 3, improved to 475 by discharge)  . Tobacco abuse 09/19/2012  . Trigeminal neuralgia    Past Surgical History:  Procedure Laterality Date  . ABDOMINAL HYSTERECTOMY     bleeding  . CHOLECYSTECTOMY    . LEFT HEART CATHETERIZATION WITH CORONARY ANGIOGRAM N/A 10/05/2011   Procedure: LEFT HEART CATHETERIZATION WITH CORONARY ANGIOGRAM;  Surgeon: Tonny Bollman, MD;  Location: [facility] CATH LAB;  Service: Cardiovascular;  Laterality: N/A;  . PLANTAR'S WART EXCISION     Social History:  reports that she quit smoking about 13 months ago. Her smoking use included Cigarettes. She started smoking about 51 years ago. She has a 40.00 pack-year smoking history. She has never used smokeless tobacco. She reports that she does not drink alcohol or use drugs. Patient lives at Home  Allergies  Allergen Reactions  . Penicillins Swelling  . Sulfa Antibiotics Hives    Hallucinations   . Codeine Nausea Only  . Hydrocodone Nausea Only    Family History  Problem Relation Age of Onset  . Cancer Father     Lung  . Cancer Mother     Liver  . Hypertension Son   . Kidney disease Brother     liveer and kidney failure  . Cancer Other     colon      Prior to Admission medications  Medication Sig Start Date End Date Taking? Authorizing Provider  acetaZOLAMIDE (DIAMOX) 250 MG tablet Take 1 tablet (250 mg total) by mouth 2 (two) times daily. 11/11/15 02/09/16 Yes Eustace MooreYvonne Sue Nelson, MD  Multiple Vitamin (MULTIVITAMIN WITH MINERALS) TABS tablet Take 1 tablet by mouth daily.   Yes Historical Provider, MD  albuterol (PROVENTIL HFA;VENTOLIN HFA) 108 (90 Base) MCG/ACT inhaler Inhale 2 puffs into the lungs every 4 (four) hours as needed for wheezing or shortness of breath. 11/11/15   Eustace MooreYvonne Sue Nelson, MD  aspirin 81 MG  tablet Take 81 mg by mouth daily.    Historical Provider, MD  beclomethasone (QVAR) 40 MCG/ACT inhaler Inhale 1 puff into the lungs 2 (two) times daily. 11/11/15   Eustace MooreYvonne Sue Nelson, MD  BREO ELLIPTA 100-25 MCG/INH AEPB Inhale 1 puff into the lungs daily. 11/11/15   Eustace MooreYvonne Sue Nelson, MD  busPIRone (BUSPAR) 15 MG tablet Take 1 tablet (15 mg total) by mouth 2 (two) times daily. 11/11/15   Eustace MooreYvonne Sue Nelson, MD  Cholecalciferol 1000 units capsule Take 1,000 Units by mouth daily.     Historical Provider, MD  doxycycline (VIBRA-TABS) 100 MG tablet Take 1 tablet (100 mg total) by mouth 2 (two) times daily. 11/11/15   Eustace MooreYvonne Sue Nelson, MD  ipratropium-albuterol (DUONEB) 0.5-2.5 (3) MG/3ML SOLN Take 3 mLs by nebulization every 4 (four) hours. 11/11/15   Eustace MooreYvonne Sue Nelson, MD  lisinopril-hydrochlorothiazide (PRINZIDE,ZESTORETIC) 10-12.5 MG tablet Take 1 tablet by mouth daily. 11/11/15   Eustace MooreYvonne Sue Nelson, MD  metoprolol tartrate (LOPRESSOR) 25 MG tablet Take 1 tablet (25 mg total) by mouth 2 (two) times daily. 11/11/15   Eustace MooreYvonne Sue Nelson, MD  predniSONE (DELTASONE) 10 MG tablet Take 1 tablet (10 mg total) by mouth 2 (two) times daily with a meal. 11/11/15   Eustace MooreYvonne Sue Nelson, MD  traMADol (ULTRAM) 50 MG tablet Take 1 tablet (50 mg total) by mouth every 6 (six) hours as needed for moderate pain or severe pain. 11/11/15   Eustace MooreYvonne Sue Nelson, MD    Physical Exam: BP 119/63   Pulse 113   Temp 98.2 F (36.8 C) (Oral)   Resp 18   Ht 5' (1.524 m)   Wt 59 kg (130 lb)   SpO2 98%   BMI 25.39 kg/m   General: Elderly Caucasian female. Awake and alert and oriented x3. No acute cardiopulmonary distress, other patient with purse lipped breathing HEENT: Normocephalic atraumatic.  Right and left ears normal in appearance.  Pupils equal, round, reactive to light. Extraocular muscles are intact. Sclerae anicteric and noninjected.  Moist mucosal membranes. No mucosal lesions.  Neck: Neck supple without  lymphadenopathy. No carotid bruits. No masses palpated.  Cardiovascular: Regular rate with normal S1-S2 sounds. No murmurs, rubs, gallops auscultated. No JVD.  Respiratory: Ms. breath sounds. Mild wheezing throughout. Abdomen: Soft, nontender, nondistended. Active bowel sounds. No masses or hepatosplenomegaly  Skin: No rashes, lesions, or ulcerations.  Dry, warm to touch. 2+ dorsalis pedis and radial pulses. Musculoskeletal: No calf or leg pain. All major joints not erythematous nontender.  No upper or lower joint deformation.  Good ROM.  No contractures  Psychiatric: Intact judgment and insight. Pleasant and cooperative. Neurologic: No focal neurological deficits. Strength is 5/5 and symmetric in upper and lower extremities.  Cranial nerves II through XII are grossly intact.           Labs on Admission: I have personally reviewed following labs and imaging studies  CBC:  Recent Labs Lab 12/19/15 1225  WBC 5.7  NEUTROABS 4.2  HGB 10.1*  HCT 33.5*  MCV 92.5  PLT 195   Basic Metabolic Panel:  Recent Labs Lab 12/19/15 1225  NA 137  K 3.1*  CL 99*  CO2 30  GLUCOSE 156*  BUN 17  CREATININE 1.00  CALCIUM 9.4   GFR: Estimated Creatinine Clearance: 41.5 mL/min (by C-G formula based on SCr of 1 mg/dL). Liver Function Tests:  Recent Labs Lab 12/19/15 1225  AST 17  ALT 16  ALKPHOS 56  BILITOT 0.3  PROT 6.8  ALBUMIN 3.7   No results for input(s): LIPASE, AMYLASE in the last 168 hours. No results for input(s): AMMONIA in the last 168 hours. Coagulation Profile: No results for input(s): INR, PROTIME in the last 168 hours. Cardiac Enzymes: No results for input(s): CKTOTAL, CKMB, CKMBINDEX, TROPONINI in the last 168 hours. BNP (last 3 results) No results for input(s): PROBNP in the last 8760 hours. HbA1C: No results for input(s): HGBA1C in the last 72 hours. CBG: No results for input(s): GLUCAP in the last 168 hours. Lipid Profile: No results for input(s): CHOL,  HDL, LDLCALC, TRIG, CHOLHDL, LDLDIRECT in the last 72 hours. Thyroid Function Tests: No results for input(s): TSH, T4TOTAL, FREET4, T3FREE, THYROIDAB in the last 72 hours. Anemia Panel: No results for input(s): VITAMINB12, FOLATE, FERRITIN, TIBC, IRON, RETICCTPCT in the last 72 hours. Urine analysis: No results found for: COLORURINE, APPEARANCEUR, LABSPEC, PHURINE, GLUCOSEU, HGBUR, BILIRUBINUR, KETONESUR, PROTEINUR, UROBILINOGEN, NITRITE, LEUKOCYTESUR Sepsis Labs: @LABRCNTIP (procalcitonin:4,lacticidven:4) )No results found for this or any previous visit (from the past 240 hour(s)).   Radiological Exams on Admission: Dg Chest Portable 1 View  Result Date: 12/19/2015 CLINICAL DATA:  Chronic shortness of breath, COPD EXAM: PORTABLE CHEST 1 VIEW COMPARISON:  11/07/2015 FINDINGS: Cardiomediastinal silhouette is stable. Hyperinflation and chronic mild interstitial prominence again noted. Stable bilateral basilar atelectasis or scarring. No superimposed infiltrate or pulmonary edema. Atherosclerotic calcifications of thoracic aorta. IMPRESSION: Hyperinflation and chronic mild interstitial prominence again noted. Stable bilateral basilar atelectasis or scarring. No superimposed infiltrate or pulmonary edema. Atherosclerotic calcifications of thoracic aorta. Electronically Signed   By: Natasha Mead M.D.   On: 12/19/2015 12:20    EKG: Independently reviewed. Sinus tachycardia. Right axis deviation. Age indeterminate anterior septal infarct. No acute ST changes.  Assessment/Plan: Principal Problem:   Acute on chronic respiratory failure with hypoxia (HCC) Active Problems:   Essential hypertension   Acute exacerbation of chronic obstructive pulmonary disease (COPD) (HCC)   Diabetes mellitus, stable (HCC)   COPD (chronic obstructive pulmonary disease) (HCC)    This patient was discussed with the ED physician, including pertinent vitals, physical exam findings, labs, and imaging.  We also discussed  care given by the ED provider.  #1 acute on chronic respiratory failure with hypoxia  Improved with current regimen  Observation #2 acute exacerbation of COPD  Antibiotics: Doxycycline  DuoNeb's every 6 scheduled with albuterol every 2 when necessary  Continue inhaled steroids and LA bronchodilator  Solu-Medrol 60 mg IV every 6 hours  Mucinex #3 diabetes type 2  Sliding scale insulin  CBGs before meals and daily at bedtime #4 essential hypertension  Continue home medications  DVT prophylaxis: Lovenox Consultants: None Code Status: DO NOT INTUBATE otherwise full code Family Communication: None  Disposition Plan: Observation with likely return to home tomorrow   Levie Heritage, DO [facility] Pager [phone]  If 7PM-7AM, please contact night-coverage [email or web address] Password [email or web address]

## 2015-12-19 NOTE — ED Notes (Signed)
Pt with productive cough, mucous with blood noted

## 2015-12-20 DIAGNOSIS — Z800 Family history of malignant neoplasm of digestive organs: Secondary | ICD-10-CM | POA: Diagnosis not present

## 2015-12-20 DIAGNOSIS — I10 Essential (primary) hypertension: Secondary | ICD-10-CM | POA: Diagnosis not present

## 2015-12-20 DIAGNOSIS — Z79899 Other long term (current) drug therapy: Secondary | ICD-10-CM | POA: Diagnosis not present

## 2015-12-20 DIAGNOSIS — M810 Age-related osteoporosis without current pathological fracture: Secondary | ICD-10-CM | POA: Diagnosis not present

## 2015-12-20 DIAGNOSIS — Z882 Allergy status to sulfonamides status: Secondary | ICD-10-CM | POA: Diagnosis not present

## 2015-12-20 DIAGNOSIS — Z841 Family history of disorders of kidney and ureter: Secondary | ICD-10-CM | POA: Diagnosis not present

## 2015-12-20 DIAGNOSIS — Z87891 Personal history of nicotine dependence: Secondary | ICD-10-CM | POA: Diagnosis not present

## 2015-12-20 DIAGNOSIS — J42 Unspecified chronic bronchitis: Secondary | ICD-10-CM

## 2015-12-20 DIAGNOSIS — Z7952 Long term (current) use of systemic steroids: Secondary | ICD-10-CM | POA: Diagnosis not present

## 2015-12-20 DIAGNOSIS — R69 Illness, unspecified: Secondary | ICD-10-CM | POA: Diagnosis not present

## 2015-12-20 DIAGNOSIS — Z801 Family history of malignant neoplasm of trachea, bronchus and lung: Secondary | ICD-10-CM | POA: Diagnosis not present

## 2015-12-20 DIAGNOSIS — E7800 Pure hypercholesterolemia, unspecified: Secondary | ICD-10-CM | POA: Diagnosis not present

## 2015-12-20 DIAGNOSIS — J441 Chronic obstructive pulmonary disease with (acute) exacerbation: Secondary | ICD-10-CM | POA: Diagnosis not present

## 2015-12-20 DIAGNOSIS — Z9981 Dependence on supplemental oxygen: Secondary | ICD-10-CM | POA: Diagnosis not present

## 2015-12-20 DIAGNOSIS — E119 Type 2 diabetes mellitus without complications: Secondary | ICD-10-CM | POA: Diagnosis not present

## 2015-12-20 DIAGNOSIS — I252 Old myocardial infarction: Secondary | ICD-10-CM | POA: Diagnosis not present

## 2015-12-20 DIAGNOSIS — I2510 Atherosclerotic heart disease of native coronary artery without angina pectoris: Secondary | ICD-10-CM | POA: Diagnosis not present

## 2015-12-20 DIAGNOSIS — Z23 Encounter for immunization: Secondary | ICD-10-CM | POA: Diagnosis not present

## 2015-12-20 DIAGNOSIS — Z885 Allergy status to narcotic agent status: Secondary | ICD-10-CM | POA: Diagnosis not present

## 2015-12-20 DIAGNOSIS — Z7982 Long term (current) use of aspirin: Secondary | ICD-10-CM | POA: Diagnosis not present

## 2015-12-20 DIAGNOSIS — K219 Gastro-esophageal reflux disease without esophagitis: Secondary | ICD-10-CM | POA: Diagnosis not present

## 2015-12-20 DIAGNOSIS — Z8249 Family history of ischemic heart disease and other diseases of the circulatory system: Secondary | ICD-10-CM | POA: Diagnosis not present

## 2015-12-20 DIAGNOSIS — Z880 Allergy status to penicillin: Secondary | ICD-10-CM | POA: Diagnosis not present

## 2015-12-20 DIAGNOSIS — J9621 Acute and chronic respiratory failure with hypoxia: Secondary | ICD-10-CM | POA: Diagnosis not present

## 2015-12-20 LAB — BASIC METABOLIC PANEL
ANION GAP: 6 (ref 5–15)
BUN: 20 mg/dL (ref 6–20)
CO2: 27 mmol/L (ref 22–32)
Calcium: 9.2 mg/dL (ref 8.9–10.3)
Chloride: 103 mmol/L (ref 101–111)
Creatinine, Ser: 0.83 mg/dL (ref 0.44–1.00)
GFR calc Af Amer: >60 mL/min (ref >60)
GFR calc non Af Amer: >60 mL/min (ref >60)
GLUCOSE: 129 mg/dL — AB (ref 65–99)
POTASSIUM: 4 mmol/L (ref 3.5–5.1)
Sodium: 136 mmol/L (ref 135–145)

## 2015-12-20 LAB — GLUCOSE, CAPILLARY
GLUCOSE-CAPILLARY: 130 mg/dL — AB (ref 65–99)
Glucose-Capillary: 146 mg/dL — ABNORMAL HIGH (ref 65–99)
Glucose-Capillary: 95 mg/dL (ref 65–99)
Glucose-Capillary: 135 mg/dL — ABNORMAL HIGH (ref 65–99)

## 2015-12-20 LAB — CBC
HCT: 32.1 % — ABNORMAL LOW (ref 36.0–46.0)
Hemoglobin: 10.1 g/dL — ABNORMAL LOW (ref 12.0–15.0)
MCH: 28.9 pg (ref 26.0–34.0)
MCHC: 31.5 g/dL (ref 30.0–36.0)
MCV: 91.7 fL (ref 78.0–100.0)
Platelets: 228 10*3/uL (ref 150–400)
RBC: 3.5 MIL/uL — AB (ref 3.87–5.11)
RDW: 14.4 % (ref 11.5–15.5)
WBC: 11.9 10*3/uL — AB (ref 4.0–10.5)

## 2015-12-20 NOTE — Care Management Obs Status (Signed)
MEDICARE OBSERVATION STATUS NOTIFICATION   Patient Details  Name: Sheila Pierce MRN: [id number] Date of Birth: 24-Dec-1945   Medicare Observation Status Notification Given:  Yes    Fuller Plan, RN 12/20/2015, 2:46 PM

## 2015-12-20 NOTE — Progress Notes (Signed)
- PROGRESS NOTE    Sheila Pierce  VAP:[id number] DOB: 1945-05-16 DOA: 12/19/2015 PCP: [name], MD   Brief Narrative:  55 yof with a history of  chronic respiratory failure with COPD, asthma, DM, GERD, CAD with history of NSTEMI in 2013, presented with shortness of breath and a productive cough. She was found to have Respiratory failure with hypoxia secondary to COPD exacerbation. She was started on doxycycline, nebs, bronchodilator, and solu-medrol. Patient still has a lot of wheezing. She will need several more days of treatment.   Assessment & Plan:   Principal Problem:   Acute on chronic respiratory failure with hypoxia (HCC) Active Problems:   Essential hypertension   Acute exacerbation of chronic obstructive pulmonary disease (COPD) (HCC)   Diabetes mellitus, stable (HCC)   COPD (chronic obstructive pulmonary disease) (HCC)  1. Acute on chronic respiratory failure with hypoxia. Secondary to COPD exacerbation. Will continue supplemental oxygen.  2. COPD exacerbation.Cotninue antibiotic doxycycline, DuoNebs, solu-medrol, and bronchodilator.  3. DM type 2. Continue sliding scale insulin. Continue to monitor.  4. HTN. Pressures are stable. Continue home medications.  5. HLD. Continue statin.   DVT prophylaxis: Lovenox  Code Status: PARTIAL CODE, NO INTUBATION  Family Communication: No family bedside Disposition Plan: Discharge home once improved.   Consultants:   None   Procedures:   None   Antimicrobials:   None    Subjective: Doing well.   Objective: Vitals:   12/19/15 2054 12/19/15 2056 12/19/15 2254 12/20/15 0448  BP: 130/77   132/85  Pulse: (!) 125 (!) 126  100  Resp: (!) 22 (!) 24  (!) 22  Temp: 98.2 F (36.8 C)   98.1 F (36.7 C)  TempSrc: Oral   Oral  SpO2: 98% 96% 96% 97%  Weight:      Height:        Intake/Output Summary (Last 24 hours) at 12/20/15 0802 Last data filed at 12/19/15 1820  Gross per 24 hour  Intake           261.67 ml   Output                0 ml  Net           261.67 ml   Filed Weights   12/19/15 1158 12/19/15 1746  Weight: 59 kg (130 lb) 63 kg (138 lb 14.2 oz)    Examination:  General exam: Appears calm and comfortable  Respiratory system: Clear to auscultation. Respiratory effort normal. Cardiovascular system: S1 & S2 heard, RRR. No JVD, murmurs, rubs, gallops or clicks. No pedal edema. Gastrointestinal system: Abdomen is nondistended, soft and nontender. No organomegaly or masses felt. Normal bowel sounds heard. Central nervous system: Alert and oriented. No focal neurological deficits. Extremities: Symmetric 5 x 5 power. Skin: No rashes, lesions or ulcers Psychiatry: Judgement and insight appear normal. Mood & affect appropriate.     Data Reviewed: I have personally reviewed following labs and imaging studies   Recent Labs Lab 12/19/15 1225 12/20/15 0610  WBC 5.7 11.9*  NEUTROABS 4.2  --   HGB 10.1* 10.1*  HCT 33.5* 32.1*  MCV 92.5 91.7  PLT 195 228    Recent Labs Lab 12/19/15 1225 12/20/15 0610  NA 137 136  K 3.1* 4.0  CL 99* 103  CO2 30 27  GLUCOSE 156* 129*  BUN 17 20  CREATININE 1.00 0.83  CALCIUM 9.4 9.2    Recent Labs Lab 12/19/15 1225  AST 17  ALT  16  ALKPHOS 56  BILITOT 0.3  PROT 6.8  ALBUMIN 3.7    Recent Labs Lab 12/19/15 2051 12/20/15 0741  GLUCAP 264* 130*     Radiology Studies: Dg Chest Portable 1 View  Result Date: 12/19/2015 CLINICAL DATA:  Chronic shortness of breath, COPD EXAM: PORTABLE CHEST 1 VIEW COMPARISON:  11/07/2015 FINDINGS: Cardiomediastinal silhouette is stable. Hyperinflation and chronic mild interstitial prominence again noted. Stable bilateral basilar atelectasis or scarring. No superimposed infiltrate or pulmonary edema. Atherosclerotic calcifications of thoracic aorta. IMPRESSION: Hyperinflation and chronic mild interstitial prominence again noted. Stable bilateral basilar atelectasis or scarring. No superimposed  infiltrate or pulmonary edema. Atherosclerotic calcifications of thoracic aorta. Electronically Signed   By: Natasha MeadLiviu  Pop M.D.   On: 12/19/2015 12:20        Scheduled Meds: . acetaZOLAMIDE  250 mg Oral BID  . aspirin  81 mg Oral Daily  . budesonide (PULMICORT) nebulizer solution  0.25 mg Nebulization BID  . busPIRone  15 mg Oral BID  . doxycycline  100 mg Oral Q12H  . enoxaparin (LOVENOX) injection  40 mg Subcutaneous Q24H  . fluticasone furoate-vilanterol  1 puff Inhalation Daily  . guaiFENesin  600 mg Oral BID  . lisinopril  10 mg Oral Daily   And  . hydrochlorothiazide  12.5 mg Oral Daily  . insulin aspart  0-15 Units Subcutaneous TID WC  . insulin aspart  0-5 Units Subcutaneous QHS  . ipratropium-albuterol  3 mL Nebulization Q6H  . mouth rinse  15 mL Mouth Rinse BID  . methylPREDNISolone (SOLU-MEDROL) injection  60 mg Intravenous Q6H  . metoprolol tartrate  25 mg Oral BID  . pneumococcal 23 valent vaccine  0.5 mL Intramuscular Tomorrow-1000   Continuous Infusions: . sodium chloride 100 mL/hr at 12/20/15 0400     LOS: 0 days    Time spent: 25 minutes     Houston SirenPeter Kindel Rochefort, MD FACP [facility] If 7PM-7AM, please contact night-coverage [email or web address] Password [email or web address] 12/20/2015, 8:02 AM   By signing my name below, I, Cynda AcresHailei Fulton, attest that this documentation has been prepared under the direction and in the presence of Houston SirenPeter Rondrick Barreira, MD. Electronically signed: Cynda AcresHailei Fulton, Scribe. 12/20/15

## 2015-12-21 LAB — GLUCOSE, CAPILLARY
GLUCOSE-CAPILLARY: 125 mg/dL — AB (ref 65–99)
GLUCOSE-CAPILLARY: 125 mg/dL — AB (ref 65–99)
Glucose-Capillary: 120 mg/dL — ABNORMAL HIGH (ref 65–99)
Glucose-Capillary: 156 mg/dL — ABNORMAL HIGH (ref 65–99)

## 2015-12-21 MED ORDER — PREDNISONE 20 MG PO TABS
40.0000 mg | ORAL_TABLET | Freq: Every day | ORAL | Status: DC
  Administered 2015-12-21 – 2015-12-22 (×2): 40 mg via ORAL
  Filled 2015-12-21 (×2): qty 2

## 2015-12-21 NOTE — Progress Notes (Signed)
PROGRESS NOTE    Sheila Pierce  XEN:[id number] DOB: November 22, 1945 DOA: 12/19/2015 PCP: [name], MD    Brief Narrative: 28 yof with a history of chronic respiratory failure with COPD, asthma, DM, GERD, CAD with history of NSTEMI in 2013, presented with shortness of breath and a productive cough. She was found to have Respiratory failure with hypoxia secondary to COPD exacerbation. She was started on doxycycline, nebs, bronchodilator, and solu-medrol. Patient still has a lot of wheezing. Today, she felt that she may be back to her baseline.    Assessment & Plan:   Principal Problem:   Acute on chronic respiratory failure with hypoxia (HCC) Active Problems:   Essential hypertension   Acute exacerbation of chronic obstructive pulmonary disease (COPD) (HCC)   Diabetes mellitus, stable (HCC)   COPD (chronic obstructive pulmonary disease) (HCC)  1. Acute on chronic respiratory failure with hypoxia. Secondary to COPD exacerbation. Will continue supplemental oxygen.  2. COPD exacerbation.Cotninue antibiotic doxycycline, DuoNebs, Will change IV steroid to Prednisone.  Increase activities and consider d/c tomorrow or Tuesday.  3. DM type 2. Continue sliding scale insulin. Continue to monitor.  4. HTN. Pressures are stable. Continue home medications.  5. HLD. Continue statin.   DVT prophylaxis: Lovenox  Code Status: PARTIAL CODE, NO INTUBATION  Family Communication: No family bedside Disposition Plan: Discharge home once improved.   Consultants:   None.  Procedures:   None.   Antimicrobials: Anti-infectives    Start     Dose/Rate Route Frequency Ordered Stop   12/19/15 1545  azithromycin (ZITHROMAX) 500 mg in dextrose 5 % 250 mL IVPB  Status:  Discontinued     500 mg 250 mL/hr over 60 Minutes Intravenous Every 24 hours 12/19/15 1533 12/19/15 1541   12/19/15 1545  doxycycline (VIBRA-TABS) tablet 100 mg     100 mg Oral Every 12 hours 12/19/15 1542         Subjective:    Feeling much better.  Still baseline DOE.    Objective: Vitals:   12/21/15 0558 12/21/15 0755 12/21/15 1325 12/21/15 1411  BP: (!) 133/59   (!) 161/67  Pulse: 92   92  Resp: 18   20  Temp: 98.2 F (36.8 C)   98.3 F (36.8 C)  TempSrc: Oral   Oral  SpO2: 100% 98% 100% 99%  Weight:      Height:        Intake/Output Summary (Last 24 hours) at 12/21/15 1416 Last data filed at 12/21/15 1412  Gross per 24 hour  Intake             2720 ml  Output             1200 ml  Net             1520 ml   Filed Weights   12/19/15 1158 12/19/15 1746  Weight: 59 kg (130 lb) 63 kg (138 lb 14.2 oz)    Examination:  General exam: Appears calm and comfortable  Respiratory system: Still has wheezing.  DOE.  Cardiovascular system: S1 & S2 heard, RRR. No JVD, murmurs, rubs, gallops or clicks. No pedal edema. Gastrointestinal system: Abdomen is nondistended, soft and nontender. No organomegaly or masses felt. Normal bowel sounds heard. Central nervous system: Alert and oriented. No focal neurological deficits. Extremities: Symmetric 5 x 5 power. Skin: No rashes, lesions or ulcers Psychiatry: Judgement and insight appear normal. Mood & affect appropriate.   Data Reviewed: I have personally reviewed following  labs and imaging studies  CBC:  Recent Labs Lab 12/19/15 1225 12/20/15 0610  WBC 5.7 11.9*  NEUTROABS 4.2  --   HGB 10.1* 10.1*  HCT 33.5* 32.1*  MCV 92.5 91.7  PLT 195 228   Basic Metabolic Panel:  Recent Labs Lab 12/19/15 1225 12/20/15 0610  NA 137 136  K 3.1* 4.0  CL 99* 103  CO2 30 27  GLUCOSE 156* 129*  BUN 17 20  CREATININE 1.00 0.83  CALCIUM 9.4 9.2   GFR: Estimated Creatinine Clearance: 51.5 mL/min (by C-G formula based on SCr of 0.83 mg/dL). Liver Function Tests:  Recent Labs Lab 12/19/15 1225  AST 17  ALT 16  ALKPHOS 56  BILITOT 0.3  PROT 6.8  ALBUMIN 3.7   CBG:  Recent Labs Lab 12/20/15 1130 12/20/15 1606 12/20/15 2027 12/21/15 0730  12/21/15 1142  GLUCAP 146* 95 135* 125* 125*    Radiology Studies: No results found.  Scheduled Meds: . acetaZOLAMIDE  250 mg Oral BID  . aspirin  81 mg Oral Daily  . budesonide (PULMICORT) nebulizer solution  0.25 mg Nebulization BID  . busPIRone  15 mg Oral BID  . doxycycline  100 mg Oral Q12H  . enoxaparin (LOVENOX) injection  40 mg Subcutaneous Q24H  . fluticasone furoate-vilanterol  1 puff Inhalation Daily  . guaiFENesin  600 mg Oral BID  . lisinopril  10 mg Oral Daily   And  . hydrochlorothiazide  12.5 mg Oral Daily  . insulin aspart  0-15 Units Subcutaneous TID WC  . insulin aspart  0-5 Units Subcutaneous QHS  . ipratropium-albuterol  3 mL Nebulization Q6H  . mouth rinse  15 mL Mouth Rinse BID  . metoprolol tartrate  25 mg Oral BID  . predniSONE  40 mg Oral QAC breakfast   Continuous Infusions:   LOS: 1 day   Lilianah Buffin, MD FACP Hospitalist.   If 7PM-7AM, please contact night-coverage [email or web address] Password [email or web address] 12/21/2015, 2:16 PM

## 2015-12-22 LAB — GLUCOSE, CAPILLARY
GLUCOSE-CAPILLARY: 84 mg/dL (ref 65–99)
GLUCOSE-CAPILLARY: 100 mg/dL — AB (ref 65–99)

## 2015-12-22 MED ORDER — PREDNISONE 20 MG PO TABS: 40.0000 mg | ORAL_TABLET | Freq: Two times a day (BID) | ORAL | 0 refills | Status: DC

## 2015-12-22 MED ORDER — BUDESONIDE 0.25 MG/2ML IN SUSP: 0.2500 mg | Freq: Two times a day (BID) | RESPIRATORY_TRACT | 12 refills | Status: DC

## 2015-12-22 MED ORDER — DOXYCYCLINE HYCLATE 100 MG PO TABS: 100.0000 mg | ORAL_TABLET | Freq: Two times a day (BID) | ORAL | 0 refills | Status: DC

## 2015-12-22 NOTE — Care Management Note (Signed)
Case Management Note  Patient Details  Name: Sheila Pierce MRN: [id number] Date of Birth: Aug 08, 1945  Subjective/Objective:                  Pt admitted with COPD exacerbation. Pt from home, ind with ADL's. She has oxygen and neb machine at home PTA. She has PCP, transportation and no difficulty affording medications. She plans to return home with self care today.   Action/Plan: NO CM needs.   Expected Discharge Date:    12/22/2015              Expected Discharge Plan:  Home/Self Care  In-House Referral:  NA  Discharge planning Services  CM Consult  Post Acute Care Choice:  NA Choice offered to:  NA  Status of Service:  Completed, signed off Malcolm Metro, RN 12/22/2015, 1:36 PM

## 2015-12-22 NOTE — Discharge Summary (Addendum)
Physician Discharge Summary  Sheila Pierce POL:[id number] DOB: 1945-03-04 DOA: 12/19/2015  PCP: [name], MD  Admit date: 12/19/2015 Discharge date: 12/22/2015  Admitted From: Home.  Disposition:  Home.   Recommendations for Outpatient Follow-up:  1. Follow up with PCP in 1-2 weeks  Home Health: none.  Equipment/Devices: none.  Discharge Condition:back to baseline.  CODE STATUS: Partial.   No intubation.  Diet recommendation: as tolerated.   Brief/Interim Summary: patient was admitted for COPD exacerbation by Dr Adrian Blackwater on Dec 19, 2015.  As per his H and P:  " Sheila Pierce is a 70 y.o. female with a history of chronic respiratory failure with COPD, asthma, diabetes, GERD, CAD with history of NSTEMI in 2013, osteoporosis. Patient with 2-3 days of worsening shortness of breath and productive cough treat with blood. Breathing worse with exertion and improved with rest. Patient's been using her albuterol inhaler with some improvement although this is short-lived. Has been taking her albuterol inhaler every 4-5 hours.  Emergency Department Course: Patient presented by EMS and received Solu-Medrol 125 mg and a DuoNeb. In the emergency department, the patient received other limbs. Chest x-ray shows no infiltrate or edema.  HOSPITAL:  Patient was admitted into the hospital, and she was continued with IV Solumedrol along with nebs and doxycycline.  Respiratory therapist who had taken care of her previously felt that she is at baseline.  She improved slowly, and has been able to get around without significant desat.   Her CBGs have been controlled despite her steroids.  She is now ready to be discharged.  I updated her son, and he agreed with plans.  She will be discharged on Prednisone 20mg  BID for another 10 days along with 5 more days of Doxycycline.  She felt that she is ready for discharge, and felt that she is at her baseline as well.  She will see her PCP in one week, and follow up  with Dr Juanetta Gosling, her pulmonologist, as required.  Thank you and Good Day.    Discharge Diagnoses:  Principal Problem:   Acute on chronic respiratory failure with hypoxia (HCC) Active Problems:   Essential hypertension   Acute exacerbation of chronic obstructive pulmonary disease (COPD) (HCC)   Diabetes mellitus, stable (HCC)   COPD (chronic obstructive pulmonary disease) (HCC)    Discharge Instructions  Discharge Instructions    Diet - low sodium heart healthy    Complete by:  As directed    Discharge instructions    Complete by:  As directed    Take your medication as directed.  Follow up with your PCP next week.   Increase activity slowly    Complete by:  As directed        Medication List    TAKE these medications   acetaZOLAMIDE 250 MG tablet Commonly known as:  DIAMOX Take 1 tablet (250 mg total) by mouth 2 (two) times daily.   albuterol 108 (90 Base) MCG/ACT inhaler Commonly known as:  PROVENTIL HFA;VENTOLIN HFA Inhale 2 puffs into the lungs every 4 (four) hours as needed for wheezing or shortness of breath.   aspirin 81 MG tablet Take 81 mg by mouth daily.   beclomethasone 40 MCG/ACT inhaler Commonly known as:  QVAR Inhale 1 puff into the lungs 2 (two) times daily.   BREO ELLIPTA 100-25 MCG/INH Aepb Generic drug:  fluticasone furoate-vilanterol Inhale 1 puff into the lungs daily.   budesonide 0.25 MG/2ML nebulizer solution Commonly known as:  PULMICORT Take  2 mLs (0.25 mg total) by nebulization 2 (two) times daily.   busPIRone 15 MG tablet Commonly known as:  BUSPAR Take 1 tablet (15 mg total) by mouth 2 (two) times daily.   Cholecalciferol 1000 units capsule Take 1,000 Units by mouth daily.   doxycycline 100 MG tablet Commonly known as:  VIBRA-TABS Take 1 tablet (100 mg total) by mouth every 12 (twelve) hours. What changed:  when to take this   glucose blood test strip Commonly known as:  ONE TOUCH ULTRA TEST Use 4 x daily as directed.  e11.65   insulin regular 250 units/2.665mL (100 units/mL) injection Commonly known as:  NOVOLIN R,HUMULIN R Inject into the skin 2 (two) times daily before a meal. Per sliding scale   ipratropium-albuterol 0.5-2.5 (3) MG/3ML Soln Commonly known as:  DUONEB Take 3 mLs by nebulization every 4 (four) hours.   lisinopril-hydrochlorothiazide 10-12.5 MG tablet Commonly known as:  PRINZIDE,ZESTORETIC Take 1 tablet by mouth daily.   metoprolol tartrate 25 MG tablet Commonly known as:  LOPRESSOR Take 1 tablet (25 mg total) by mouth 2 (two) times daily.   multivitamin with minerals Tabs tablet Take 1 tablet by mouth daily.   potassium chloride 10 MEQ tablet Commonly known as:  K-DUR Take 10 mEq by mouth daily.   predniSONE 20 MG tablet Commonly known as:  DELTASONE Take 2 tablets (40 mg total) by mouth 2 (two) times daily with a meal. What changed:  medication strength  how much to take   tiotropium 18 MCG inhalation capsule Commonly known as:  SPIRIVA Place 18 mcg into inhaler and inhale daily.   traMADol 50 MG tablet Commonly known as:  ULTRAM Take 1 tablet (50 mg total) by mouth every 6 (six) hours as needed for moderate pain or severe pain.       Allergies  Allergen Reactions  . Penicillins Swelling  . Sulfa Antibiotics Hives    Hallucinations   . Codeine Nausea Only  . Hydrocodone Nausea Only    Consultations:  None   Procedures/Studies: Dg Chest Portable 1 View  Result Date: 12/19/2015 CLINICAL DATA:  Chronic shortness of breath, COPD EXAM: PORTABLE CHEST 1 VIEW COMPARISON:  11/07/2015 FINDINGS: Cardiomediastinal silhouette is stable. Hyperinflation and chronic mild interstitial prominence again noted. Stable bilateral basilar atelectasis or scarring. No superimposed infiltrate or pulmonary edema. Atherosclerotic calcifications of thoracic aorta. IMPRESSION: Hyperinflation and chronic mild interstitial prominence again noted. Stable bilateral basilar  atelectasis or scarring. No superimposed infiltrate or pulmonary edema. Atherosclerotic calcifications of thoracic aorta. Electronically Signed   By: Natasha MeadLiviu  Pop M.D.   On: 12/19/2015 12:20      Subjective: Feeling better.   Ready to go home.    Discharge Exam: Vitals:   12/21/15 2039 12/22/15 0447  BP: (!) 144/57 135/61  Pulse: (!) 104 91  Resp: (!) 23 20  Temp: 97.8 F (36.6 C) 97.5 F (36.4 C)   Vitals:   12/22/15 0116 12/22/15 0447 12/22/15 0744 12/22/15 0746  BP:  135/61    Pulse:  91    Resp:  20    Temp:  97.5 F (36.4 C)    TempSrc:  Oral    SpO2: 94% 100% 100% 100%  Weight:      Height:        General: Pt is alert, awake, not in acute distress Cardiovascular: RRR, S1/S2 +, no rubs, no gallops Respiratory: CTA bilaterally, no wheezing, no rhonchi Abdominal: Soft, NT, ND, bowel sounds + Extremities: no edema, no  cyanosis    The results of significant diagnostics from this hospitalization (including imaging, microbiology, ancillary and laboratory) are listed below for reference.    Labs: BNP (last 3 results)  Recent Labs  12/19/15 1225  BNP 25.0   Basic Metabolic Panel:  Recent Labs Lab 12/19/15 1225 12/20/15 0610  NA 137 136  K 3.1* 4.0  CL 99* 103  CO2 30 27  GLUCOSE 156* 129*  BUN 17 20  CREATININE 1.00 0.83  CALCIUM 9.4 9.2   Liver Function Tests:  Recent Labs Lab 12/19/15 1225  AST 17  ALT 16  ALKPHOS 56  BILITOT 0.3  PROT 6.8  ALBUMIN 3.7   CBC:  Recent Labs Lab 12/19/15 1225 12/20/15 0610  WBC 5.7 11.9*  NEUTROABS 4.2  --   HGB 10.1* 10.1*  HCT 33.5* 32.1*  MCV 92.5 91.7  PLT 195 228   CBG:  Recent Labs Lab 12/21/15 1142 12/21/15 1610 12/21/15 2041 12/22/15 0754 12/22/15 1156  GLUCAP 125* 120* 156* 84 100*    Time coordinating discharge: Over 30 minutes SIGNED:   Houston Siren, MD FACP [facility] 12/22/2015, 1:01 PM   If 7PM-7AM, please contact night-coverage [email or web address] Password [email or web address]

## 2015-12-22 NOTE — Progress Notes (Signed)
Pt discharged home today per Dr. Le.  Pt's IV site D/C'd and WDL.  Pt's VSS.  Pt provided with home medication list, discharge instructions and prescriptions.  Verbalized understanding.  Pt left floor via WC in stable condition accompanied by NT. 

## 2015-12-22 NOTE — Care Management Important Message (Signed)
Important Message  Patient Details  Name: Sheila Pierce MRN: [id number] Date of Birth: 1945/04/03   Medicare Important Message Given:  Yes    Malcolm Metro, RN 12/22/2015, 1:37 PM

## 2015-12-23 DIAGNOSIS — J440 Chronic obstructive pulmonary disease with acute lower respiratory infection: Secondary | ICD-10-CM | POA: Diagnosis not present

## 2015-12-24 DIAGNOSIS — J440 Chronic obstructive pulmonary disease with acute lower respiratory infection: Secondary | ICD-10-CM | POA: Diagnosis not present

## 2015-12-25 DIAGNOSIS — J440 Chronic obstructive pulmonary disease with acute lower respiratory infection: Secondary | ICD-10-CM | POA: Diagnosis not present

## 2015-12-26 DIAGNOSIS — J440 Chronic obstructive pulmonary disease with acute lower respiratory infection: Secondary | ICD-10-CM | POA: Diagnosis not present

## 2015-12-26 DIAGNOSIS — R69 Illness, unspecified: Secondary | ICD-10-CM | POA: Diagnosis not present

## 2015-12-27 DIAGNOSIS — J440 Chronic obstructive pulmonary disease with acute lower respiratory infection: Secondary | ICD-10-CM | POA: Diagnosis not present

## 2015-12-28 DIAGNOSIS — J440 Chronic obstructive pulmonary disease with acute lower respiratory infection: Secondary | ICD-10-CM | POA: Diagnosis not present

## 2015-12-29 DIAGNOSIS — J440 Chronic obstructive pulmonary disease with acute lower respiratory infection: Secondary | ICD-10-CM | POA: Diagnosis not present

## 2015-12-30 DIAGNOSIS — J440 Chronic obstructive pulmonary disease with acute lower respiratory infection: Secondary | ICD-10-CM | POA: Diagnosis not present

## 2015-12-31 ENCOUNTER — Ambulatory Visit: Payer: Self-pay | Admitting: Family Medicine

## 2015-12-31 DIAGNOSIS — J440 Chronic obstructive pulmonary disease with acute lower respiratory infection: Secondary | ICD-10-CM | POA: Diagnosis not present

## 2016-01-01 ENCOUNTER — Ambulatory Visit (INDEPENDENT_AMBULATORY_CARE_PROVIDER_SITE_OTHER): Payer: Medicare HMO | Admitting: "Endocrinology

## 2016-01-01 ENCOUNTER — Encounter: Payer: Self-pay | Admitting: "Endocrinology

## 2016-01-01 VITALS — BP 136/83 | HR 79 | Ht 60.0 in

## 2016-01-01 DIAGNOSIS — E7800 Pure hypercholesterolemia, unspecified: Secondary | ICD-10-CM

## 2016-01-01 DIAGNOSIS — J440 Chronic obstructive pulmonary disease with acute lower respiratory infection: Secondary | ICD-10-CM | POA: Diagnosis not present

## 2016-01-01 DIAGNOSIS — E119 Type 2 diabetes mellitus without complications: Secondary | ICD-10-CM

## 2016-01-01 DIAGNOSIS — I10 Essential (primary) hypertension: Secondary | ICD-10-CM | POA: Diagnosis not present

## 2016-01-01 MED ORDER — METFORMIN HCL 500 MG PO TABS: 500.0000 mg | ORAL_TABLET | Freq: Two times a day (BID) | ORAL | 3 refills | Status: DC

## 2016-01-01 NOTE — Progress Notes (Signed)
Subjective:    Patient ID: Sheila Pierce, female    DOB: 02/10/1945. Patient is being seen in f/u for management of diabetes requested by  [name], MD  Past Medical History:  Diagnosis Date  . Allergy   . Anemia   . Anxiety   . Arthritis   . Asthma   . Cataract   . Chronic kidney disease   . COPD (chronic obstructive pulmonary disease) (HCC)    Chronic resp failure on home O2  . Depression   . Diabetes mellitus without complication (HCC)   . Emphysema of lung (HCC)   . GERD (gastroesophageal reflux disease)   . Hypercholesteremia 12/16/2015  . Hyperglycemia    Noted 09/2011 admission in setting of steroid use with normal HgbA1C.  Marland Kitchen Hypertension   . Hypokalemia   . Hyponatremia    Noted 09/2011 admission.  . NSTEMI (non-ST elevated myocardial infarction) (HCC)    a. 09/2011 in setting of acute on chronic resp failure/COPD exacerbation --> cath demonstrated nonobstructive CAD 10/05/11 with EF 50%;  08/2012 elevated Ti in setting of COPD flare -->Echo: EF 60-65%, Gr 1 DD -->Med Rx.  . Osteoporosis   . Oxygen deficiency   . QT prolongation    Noted on EKG 09/2011 (590 in setting of K of 3, improved to 475 by discharge)  . Tobacco abuse 09/19/2012  . Trigeminal neuralgia    Past Surgical History:  Procedure Laterality Date  . ABDOMINAL HYSTERECTOMY     bleeding  . CHOLECYSTECTOMY    . LEFT HEART CATHETERIZATION WITH CORONARY ANGIOGRAM N/A 10/05/2011   Procedure: LEFT HEART CATHETERIZATION WITH CORONARY ANGIOGRAM;  Surgeon: Tonny Bollman, MD;  Location: [facility] C[facility] LAB;  Service: Cardiovascular;  Laterality: N/A;  . PLANTAR'S WART EXCISION     Social History   Social History  . Marital status: Divorced    Spouse name: N/A  . Number of children: 3  . Years of education: 6   Occupational History  . retired    Social History Main Topics  . Smoking status: Former Smoker    Packs/day: 1.00    Years: 40.00    Types: Cigarettes    Start date: 01/26/1964    Quit date:  10/2014  . Smokeless tobacco: Never Used     Comment: 1 pack every 3 days  . Alcohol use No  . Drug use: No  . Sexual activity: Not Currently   Other Topics Concern  . None   Social History Narrative   Lives with 2 adult sons   Outpatient Encounter Prescriptions as of 01/01/2016  Medication Sig  . acetaZOLAMIDE (DIAMOX) 250 MG tablet Take 1 tablet (250 mg total) by mouth 2 (two) times daily.  Marland Kitchen albuterol (PROVENTIL HFA;VENTOLIN HFA) 108 (90 Base) MCG/ACT inhaler Inhale 2 puffs into the lungs every 4 (four) hours as needed for wheezing or shortness of breath.  Marland Kitchen aspirin 81 MG tablet Take 81 mg by mouth daily.  . beclomethasone (QVAR) 40 MCG/ACT inhaler Inhale 1 puff into the lungs 2 (two) times daily.  Marland Kitchen BREO ELLIPTA 100-25 MCG/INH AEPB Inhale 1 puff into the lungs daily.  . budesonide (PULMICORT) 0.25 MG/2ML nebulizer solution Take 2 mLs (0.25 mg total) by nebulization 2 (two) times daily.  . busPIRone (BUSPAR) 15 MG tablet Take 1 tablet (15 mg total) by mouth 2 (two) times daily.  . Cholecalciferol 1000 units capsule Take 1,000 Units by mouth daily.   Marland Kitchen doxycycline (VIBRA-TABS) 100 MG tablet Take  1 tablet (100 mg total) by mouth every 12 (twelve) hours.  Marland Kitchen. glucose blood (ONE TOUCH ULTRA TEST) test strip Use 4 x daily as directed. e11.65  . ipratropium-albuterol (DUONEB) 0.5-2.5 (3) MG/3ML SOLN Take 3 mLs by nebulization every 4 (four) hours.  Marland Kitchen. lisinopril-hydrochlorothiazide (PRINZIDE,ZESTORETIC) 10-12.5 MG tablet Take 1 tablet by mouth daily.  . metFORMIN (GLUCOPHAGE) 500 MG tablet Take 1 tablet (500 mg total) by mouth 2 (two) times daily with a meal.  . metoprolol tartrate (LOPRESSOR) 25 MG tablet Take 1 tablet (25 mg total) by mouth 2 (two) times daily.  . Multiple Vitamin (MULTIVITAMIN WITH MINERALS) TABS tablet Take 1 tablet by mouth daily.  . potassium chloride (K-DUR) 10 MEQ tablet Take 10 mEq by mouth daily.  . predniSONE (DELTASONE) 20 MG tablet Take 2 tablets (40 mg total) by  mouth 2 (two) times daily with a meal.  . tiotropium (SPIRIVA) 18 MCG inhalation capsule Place 18 mcg into inhaler and inhale daily.  . traMADol (ULTRAM) 50 MG tablet Take 1 tablet (50 mg total) by mouth every 6 (six) hours as needed for moderate pain or severe pain.  . [DISCONTINUED] insulin regular (NOVOLIN R,HUMULIN R) 250 units/2.665mL (100 units/mL) injection Inject into the skin 2 (two) times daily before a meal. Per sliding scale   No facility-administered encounter medications on file as of 01/01/2016.    ALLERGIES: Allergies  Allergen Reactions  . Penicillins Swelling  . Sulfa Antibiotics Hives    Hallucinations   . Codeine Nausea Only  . Hydrocodone Nausea Only   VACCINATION STATUS: Immunization History  Administered Date(s) Administered  . Influenza Split 10/06/2011  . Pneumococcal Conjugate-13 11/11/2015  . Pneumococcal Polysaccharide-23 12/20/2015  . Tdap 11/11/2015    Diabetes  She presents for her follow-up diabetic visit. She has type 2 diabetes mellitus. Onset time: She states she was diagnosed last year at age 70. Her disease course has been improving. There are no hypoglycemic associated symptoms. Pertinent negatives for hypoglycemia include no confusion, headaches, pallor or seizures. There are no diabetic associated symptoms. Pertinent negatives for diabetes include no chest pain and no polyuria. There are no hypoglycemic complications. Symptoms are improving. There are no diabetic complications. Risk factors for coronary artery disease include diabetes mellitus, dyslipidemia, obesity, hypertension, sedentary lifestyle and tobacco exposure. Current diabetic treatment includes insulin injections. Her weight is increasing steadily. She is following a generally unhealthy diet. When asked about meal planning, she reported none. She has not had a previous visit with a dietitian. Her breakfast blood glucose range is generally 130-140 mg/dl. Her lunch blood glucose range is  generally 130-140 mg/dl. Her dinner blood glucose range is generally 130-140 mg/dl. Her overall blood glucose range is 130-140 mg/dl. An ACE inhibitor/angiotensin II receptor blocker is being taken.  Hyperlipidemia  This is a chronic problem. The current episode started more than 1 year ago. The problem is controlled. Pertinent negatives include no chest pain, myalgias or shortness of breath. She is currently on no antihyperlipidemic treatment. Risk factors for coronary artery disease include diabetes mellitus, dyslipidemia, hypertension, obesity and a sedentary lifestyle.  Hypertension  This is a chronic problem. The current episode started more than 1 year ago. The problem is uncontrolled. Pertinent negatives include no chest pain, headaches, palpitations or shortness of breath. Risk factors for coronary artery disease include smoking/tobacco exposure, sedentary lifestyle, obesity, dyslipidemia and diabetes mellitus. Past treatments include ACE inhibitors and diuretics.       Review of Systems  Constitutional: Negative for chills,  fever and unexpected weight change.  HENT: Negative for trouble swallowing and voice change.   Eyes: Negative for visual disturbance.  Respiratory: Negative for cough, shortness of breath and wheezing.   Cardiovascular: Negative for chest pain, palpitations and leg swelling.  Gastrointestinal: Negative for diarrhea, nausea and vomiting.  Endocrine: Negative for cold intolerance, heat intolerance and polyuria.  Musculoskeletal: Positive for back pain and gait problem. Negative for arthralgias and myalgias.       Patient is wheelchair-bound.  Skin: Negative for color change, pallor, rash and wound.  Neurological: Negative for seizures and headaches.  Psychiatric/Behavioral: Negative for confusion and suicidal ideas.    Objective:    BP 136/83   Pulse 79   Ht 5' (1.524 m)   Wt Readings from Last 3 Encounters:  12/19/15 138 lb 14.2 oz (63 kg)  12/16/15 139 lb  (63 kg)  11/11/15 138 lb (62.6 kg)    Physical Exam  Constitutional: She is oriented to person, place, and time. She appears well-developed.  Agent is wheelchair-bound due to deconditioning and advancing COPD. She is also on oxygen per nasal cannula.  HENT:  Head: Normocephalic and atraumatic.  Eyes: EOM are normal.  Neck: Normal range of motion. Neck supple. No tracheal deviation present. No thyromegaly present.  Cardiovascular: Normal rate and regular rhythm.   Pulmonary/Chest: Effort normal. She has wheezes. She has rales.  Uses oxygen per nasal cannula.  Abdominal: Soft. Bowel sounds are normal. There is no tenderness. There is no guarding.  Musculoskeletal: Normal range of motion. She exhibits no edema.  Neurological: She is alert and oriented to person, place, and time. She has normal reflexes. No cranial nerve deficit. Coordination normal.  Skin: Skin is warm and dry. No rash noted. No erythema. No pallor.  Psychiatric: She has a normal mood and affect. Judgment normal.     CMP     Component Value Date/Time   NA 136 12/20/2015 0610   K 4.0 12/20/2015 0610   CL 103 12/20/2015 0610   CO2 27 12/20/2015 0610   GLUCOSE 129 (H) 12/20/2015 0610   BUN 20 12/20/2015 0610   CREATININE 0.83 12/20/2015 0610   CALCIUM 9.2 12/20/2015 0610   PROT 6.8 12/19/2015 1225   ALBUMIN 3.7 12/19/2015 1225   AST 17 12/19/2015 1225   ALT 16 12/19/2015 1225   ALKPHOS 56 12/19/2015 1225   BILITOT 0.3 12/19/2015 1225   GFRNONAA >60 12/20/2015 0610   GFRAA >60 12/20/2015 0610     Diabetic Labs (most recent): Lab Results  Component Value Date   HGBA1C 5.3 11/05/2015   HGBA1C 5.6 10/05/2011     Lipid Panel ( most recent) Lipid Panel     Component Value Date/Time   CHOL 158 10/05/2011 0143   TRIG 75 10/05/2011 0143   HDL 54 10/05/2011 0143   CHOLHDL 2.9 10/05/2011 0143   VLDL 15 10/05/2011 0143   LDLCALC 89 10/05/2011 0143      Assessment & Plan:   1. Diabetes mellitus, stable  (HCC) - Patient has  recently diagnosed asymptomatic  type 2 DM .  her most recent A1c of 5.3  %- assistant with tight control.  - Reviewing her prior records, she never had significantly elevated glycemic profile nor A1c more than 6.5%. Recent labs reviewed. - The 10 mg prednisone she is taking daily for COPD clearly is a risk for type 2 diabetes, as well as adrenal insufficiency if it is stopped suddenly.  - Patient has several severe comorbidities  including advanced COPD on oxygen supplement however she does not report direct gross diabetes complications. -  Nevertheless, Mrs. Mauel  remains at a high risk for more acute and chronic complications of diabetes which include CAD, CVA, CKD, retinopathy, and neuropathy. These are all discussed in detail with the patient.  - I have counseled the patient on diet management and weight loss, by adopting a carbohydrate restricted/protein rich diet.  - Suggestion is made for patient to avoid simple carbohydrates   from their diet including Cakes , Desserts, Ice Cream,  Soda (  diet and regular) , Sweet Tea , Candies,  Chips, Cookies, Artificial Sweeteners,   and "Sugar-free" Products . This will help patient to have stable blood glucose profile and potentially avoid unintended weight gain.  - I encouraged the patient to switch to  unprocessed or minimally processed complex starch and increased protein intake (animal or plant source), fruits, and vegetables.  - Patient is advised to stick to a routine mealtimes to eat 3 meals  a day and avoid unnecessary snacks ( to snack only to correct hypoglycemia).  - The patient will be scheduled with Norm Salt, RDN, CDE for individualized DM education.  - I have approached patient with the following individualized plan to manage diabetes and patient agrees:   - Her meter and logs shows that her average blood glucose over the last 7 days was 129. This is clearly on target with no insulin since last visit.  -  Patient is advised to stop insulin.  - She has normal renal function, she may benefit from low-dose metformin. I discussed and initiated metformin 500 mg by mouth twice a day for her.  side effects and precautions discussed with her. -Patient is encouraged to call clinic for blood glucose levels less than 70 or above 300 mg /dl.  - Patient specific target  A1c;  LDL, HDL, Triglycerides, and  Waist Circumference were discussed in detail.  2) BP/HTN:  uncontrolled .Continue current medications including ACEI/ARB. 3) Lipids/HPL:   Control unknown, no recent lipid panel. She is not on statins. I will obtain fasting lipid panel on subsequent labs.   4)  Weight/Diet: CDE Consult will be initiated ,,  she is limited on her exercise abilities, detailed carbohydrates information provided.  5) Chronic Care/Health Maintenance:  -Patient is on ACEI/ARB  medications and encouraged to continue to follow up with Ophthalmology, Podiatrist at least yearly or according to recommendations, and advised to  stay away from smoking ( she has 60+ pack year history of smoking). I have recommended yearly flu vaccine and pneumonia vaccination at least every 5 years;  and  sleep for at least 7 hours a day.  - 30 minutes of time was spent on the care of this patient , 50% of which was applied for counseling on diabetes complications and their preventions.  - Patient to bring meter and  blood glucose logs during their next visit.   - I advised patient to maintain close follow up with [name], MD for primary care needs.  Follow up plan: - Return in about 3 months (around 03/31/2016) for follow up with pre-visit labs.  Marquis Lunch, MD Phone: [phone]  Fax: [phone]   01/01/2016, 2:13 PM

## 2016-01-01 NOTE — Patient Instructions (Signed)

## 2016-01-02 DIAGNOSIS — J440 Chronic obstructive pulmonary disease with acute lower respiratory infection: Secondary | ICD-10-CM | POA: Diagnosis not present

## 2016-01-03 DIAGNOSIS — J440 Chronic obstructive pulmonary disease with acute lower respiratory infection: Secondary | ICD-10-CM | POA: Diagnosis not present

## 2016-01-04 DIAGNOSIS — J440 Chronic obstructive pulmonary disease with acute lower respiratory infection: Secondary | ICD-10-CM | POA: Diagnosis not present

## 2016-01-06 DIAGNOSIS — J440 Chronic obstructive pulmonary disease with acute lower respiratory infection: Secondary | ICD-10-CM | POA: Diagnosis not present

## 2016-01-07 ENCOUNTER — Telehealth: Payer: Self-pay

## 2016-01-07 ENCOUNTER — Ambulatory Visit (INDEPENDENT_AMBULATORY_CARE_PROVIDER_SITE_OTHER): Payer: Medicare HMO | Admitting: Family Medicine

## 2016-01-07 ENCOUNTER — Encounter: Payer: Self-pay | Admitting: Family Medicine

## 2016-01-07 VITALS — BP 122/68 | HR 72 | Temp 97.6°F | Resp 28 | Ht 60.0 in | Wt 140.1 lb

## 2016-01-07 DIAGNOSIS — Z9981 Dependence on supplemental oxygen: Secondary | ICD-10-CM

## 2016-01-07 DIAGNOSIS — J441 Chronic obstructive pulmonary disease with (acute) exacerbation: Secondary | ICD-10-CM

## 2016-01-07 DIAGNOSIS — J440 Chronic obstructive pulmonary disease with acute lower respiratory infection: Secondary | ICD-10-CM | POA: Diagnosis not present

## 2016-01-07 MED ORDER — GUAIFENESIN ER 600 MG PO TB12: 600.0000 mg | ORAL_TABLET | Freq: Two times a day (BID) | ORAL | 11 refills | Status: DC

## 2016-01-07 NOTE — Patient Instructions (Addendum)
Drink lots of fluids Use a humidifier if available Continue inhalers as directed See me in 3 months Call sooner for problems

## 2016-01-07 NOTE — Progress Notes (Signed)
Chief Complaint  Patient presents with  . Transitions Of Care   Since being seen last the patient was hospitalized from 12/22/2015 to 12/19/2015 for an exacerbation of her COPD. She states she is back now to baseline. She is still on a prednisone taper. She is not currently on antibiotics. She still feeling quite tired. She has a cough productive of thick sputum. Her son states she was advised to take Mucinex. They cannot afford this. We'll try prescribing it for better coverage. I discussed she also needs to keep up with fluid intake, and consider a humidifier in the home for extra moisture. She is compliant with her inhalers. In addition she is now using a nebulizer for bronchodilators and steroids. She is under the care of Dr. Juanetta Gosling in pulmonary medicine. She spends most of her time in her hospital bed watching television. She is very inactive. She cannot walk without assistance from one of her sons her caregivers. Apparently there is some conflict with the 2 sons regarding her finances. The son accompanying her today once cleared her mentally competent to handle her own financial peers. She would need to come back for a mental status evaluation in order for me to write a letter in this regard. The patient was diagnosed while in the hospital on a prior occasion with diabetes. I consulted Dr. Fransico Him in endocrinology and she is now off of insulin and fingersticks. She is taking metformin daily. Hemoglobin A1c will be checked in 3 months. She has not had any high or low sugars. Her family states that she is eating well. Her weight is up 2 pounds. When hospitalized her albumin was good at 3.7. They request a prescription for boost, however I don't know that it's indicated at this time. They are giving her [employer].   Patient Active Problem List   Diagnosis Date Noted  . COPD (chronic obstructive pulmonary disease) (HCC) 12/19/2015  . Acute on chronic respiratory failure with  hypoxia (HCC) 12/19/2015  . Hypercholesteremia 12/16/2015  . Osteoporosis 12/01/2015  . Colonoscopy refused 12/01/2015  . Macular degeneration, age related, nonexudative 11/26/2015  . Cataract incipient, senile, bilateral 11/26/2015  . Onychomycosis of toenail 11/11/2015  . Aortic atherosclerosis (HCC) 11/11/2015  . Abnormal thyroid function test 11/06/2015  . Diabetes mellitus, stable (HCC) 11/05/2015  . COPD exacerbation (HCC) 11/04/2015  . Hyponatremia 11/03/2015  . AKI (acute kidney injury) (HCC) 11/03/2015  . Normocytic anemia 11/03/2015  . Anxiety 11/03/2015  . Acute exacerbation of chronic obstructive pulmonary disease (COPD) (HCC) 09/19/2012  . CAD (coronary artery disease) 11/24/2011  . Essential hypertension   . QT prolongation   . Hypokalemia   . MI, acute, non ST segment elevation (HCC) 10/05/2011    Outpatient Encounter Prescriptions as of 01/07/2016  Medication Sig  . acetaZOLAMIDE (DIAMOX) 250 MG tablet Take 1 tablet (250 mg total) by mouth 2 (two) times daily.  Marland Kitchen albuterol (PROVENTIL HFA;VENTOLIN HFA) 108 (90 Base) MCG/ACT inhaler Inhale 2 puffs into the lungs every 4 (four) hours as needed for wheezing or shortness of breath.  Marland Kitchen aspirin 81 MG tablet Take 81 mg by mouth daily.  . beclomethasone (QVAR) 40 MCG/ACT inhaler Inhale 1 puff into the lungs 2 (two) times daily.  Marland Kitchen BREO ELLIPTA 100-25 MCG/INH AEPB Inhale 1 puff into the lungs daily.  . budesonide (PULMICORT) 0.25 MG/2ML nebulizer solution Take 2 mLs (0.25 mg total) by nebulization 2 (two) times daily.  . busPIRone (BUSPAR) 15 MG tablet Take 1 tablet (  15 mg total) by mouth 2 (two) times daily.  . Cholecalciferol 1000 units capsule Take 1,000 Units by mouth daily.   Marland Kitchen doxycycline (VIBRA-TABS) 100 MG tablet Take 1 tablet (100 mg total) by mouth every 12 (twelve) hours.  Marland Kitchen glucose blood (ONE TOUCH ULTRA TEST) test strip Use 4 x daily as directed. e11.65  . ipratropium-albuterol (DUONEB) 0.5-2.5 (3) MG/3ML SOLN  Take 3 mLs by nebulization every 4 (four) hours.  Marland Kitchen lisinopril-hydrochlorothiazide (PRINZIDE,ZESTORETIC) 10-12.5 MG tablet Take 1 tablet by mouth daily.  . metFORMIN (GLUCOPHAGE) 500 MG tablet Take 1 tablet (500 mg total) by mouth 2 (two) times daily with a meal.  . metoprolol tartrate (LOPRESSOR) 25 MG tablet Take 1 tablet (25 mg total) by mouth 2 (two) times daily.  . Multiple Vitamin (MULTIVITAMIN WITH MINERALS) TABS tablet Take 1 tablet by mouth daily.  . potassium chloride (K-DUR) 10 MEQ tablet Take 10 mEq by mouth daily.  . predniSONE (DELTASONE) 20 MG tablet Take 2 tablets (40 mg total) by mouth 2 (two) times daily with a meal.  . tiotropium (SPIRIVA) 18 MCG inhalation capsule Place 18 mcg into inhaler and inhale daily.  . traMADol (ULTRAM) 50 MG tablet Take 1 tablet (50 mg total) by mouth every 6 (six) hours as needed for moderate pain or severe pain.  Marland Kitchen guaiFENesin (MUCINEX) 600 MG 12 hr tablet Take 1 tablet (600 mg total) by mouth 2 (two) times daily.   No facility-administered encounter medications on file as of 01/07/2016.     Allergies  Allergen Reactions  . Penicillins Swelling  . Sulfa Antibiotics Hives    Hallucinations   . Codeine Nausea Only  . Hydrocodone Nausea Only    Review of Systems  Constitutional: Positive for activity change, appetite change and fatigue. Negative for unexpected weight change.  HENT: Negative for congestion, postnasal drip, rhinorrhea and sinus pressure.   Eyes: Negative for visual disturbance.  Respiratory: Positive for cough, shortness of breath and wheezing.        Per patient baseline  Cardiovascular: Negative for chest pain, palpitations and leg swelling.  Gastrointestinal: Negative for blood in stool, constipation and diarrhea.  Genitourinary: Negative for difficulty urinating and frequency.  Musculoskeletal: Positive for back pain and myalgias.       Hurt all over  Skin: Negative.   Neurological: Positive for weakness and  light-headedness.       Still with positional dizziness, weakness  Psychiatric/Behavioral: Positive for dysphoric mood and sleep disturbance. The patient is nervous/anxious.     BP 122/68 (BP Location: Left Arm, Patient Position: Sitting, Cuff Size: Normal)   Pulse 72   Temp 97.6 F (36.4 C) (Oral)   Resp (!) 28   Ht 5' (1.524 m)   Wt 140 lb 1.3 oz (63.5 kg)   SpO2 100% Comment: o2 3 lpm via n/c  BMI 27.36 kg/m   Physical Exam  Constitutional: She is oriented to person, place, and time. She appears well-developed and well-nourished. She appears distressed.  Short of breath. In wheelchair. Oxygen dependent. Appears fatigued.  HENT:  Head: Normocephalic and atraumatic.  Right Ear: External ear normal.  Left Ear: External ear normal.  Mouth/Throat: Oropharynx is clear and moist.  Edentulous. Mucous membranes dry  Eyes: Conjunctivae are normal. Pupils are equal, round, and reactive to light.  Neck: Normal range of motion. Neck supple. No thyromegaly present.  Cardiovascular: Normal rate, regular rhythm and normal heart sounds.   Pulmonary/Chest: She has no wheezes. She has no rales.  Decreased breath sounds. Tachypnea. No rales or wheeze  Abdominal: Soft. Bowel sounds are normal. There is no tenderness.  Musculoskeletal: Normal range of motion. She exhibits no edema.  Lymphadenopathy:    She has no cervical adenopathy.  Neurological: She is alert and oriented to person, place, and time.  Skin: Skin is warm and dry.  Psychiatric: Her behavior is normal.  Depressed affect    ASSESSMENT/PLAN:  1. COPD with exacerbation (HCC)  2. Oxygen dependent  Discussed importance of compliance with inhalers. Discussed importance of keeping all family and caregivers healthy, to reduce exposure to respiratory illness. Recommend keeping masks in the house for visitors. Discussed the importance of proper nutrition and adequate protein intake Discussed the importance of hydration and  humidification of air 25 minutes is spent with patient and family more than 50% in discussing patient's home care needs, and the seriousness of her oxygen dependent COPD  Patient Instructions  Drink lots of fluids Use a humidifier if available Continue inhalers as directed See me in 3 months Call sooner for problems    Eustace MooreYvonne Sue Thoma Paulsen, MD

## 2016-01-08 ENCOUNTER — Other Ambulatory Visit: Payer: Self-pay | Admitting: Family Medicine

## 2016-01-08 DIAGNOSIS — J440 Chronic obstructive pulmonary disease with acute lower respiratory infection: Secondary | ICD-10-CM | POA: Diagnosis not present

## 2016-01-08 MED ORDER — N-ACETYL-L-CYSTEINE 600 MG PO CAPS: 1.0000 | ORAL_CAPSULE | Freq: Two times a day (BID) | ORAL | 11 refills | Status: DC

## 2016-01-08 NOTE — Telephone Encounter (Signed)
I sent a different prescription - but cannot tell if this is covered , either.  They can call pharmacy to check.

## 2016-01-09 ENCOUNTER — Other Ambulatory Visit: Payer: Self-pay

## 2016-01-09 DIAGNOSIS — J440 Chronic obstructive pulmonary disease with acute lower respiratory infection: Secondary | ICD-10-CM | POA: Diagnosis not present

## 2016-01-09 NOTE — Telephone Encounter (Signed)
Spoke with son and he is aware the medication will not be covered due to being offered otc.  Assisted son with the generic name of the medication.  He will purchase otc.

## 2016-01-10 DIAGNOSIS — J440 Chronic obstructive pulmonary disease with acute lower respiratory infection: Secondary | ICD-10-CM | POA: Diagnosis not present

## 2016-01-11 DIAGNOSIS — J440 Chronic obstructive pulmonary disease with acute lower respiratory infection: Secondary | ICD-10-CM | POA: Diagnosis not present

## 2016-01-11 DIAGNOSIS — J9610 Chronic respiratory failure, unspecified whether with hypoxia or hypercapnia: Secondary | ICD-10-CM | POA: Diagnosis not present

## 2016-01-11 DIAGNOSIS — J449 Chronic obstructive pulmonary disease, unspecified: Secondary | ICD-10-CM | POA: Diagnosis not present

## 2016-01-12 DIAGNOSIS — J440 Chronic obstructive pulmonary disease with acute lower respiratory infection: Secondary | ICD-10-CM | POA: Diagnosis not present

## 2016-01-13 DIAGNOSIS — J440 Chronic obstructive pulmonary disease with acute lower respiratory infection: Secondary | ICD-10-CM | POA: Diagnosis not present

## 2016-01-14 DIAGNOSIS — J440 Chronic obstructive pulmonary disease with acute lower respiratory infection: Secondary | ICD-10-CM | POA: Diagnosis not present

## 2016-01-15 DIAGNOSIS — J440 Chronic obstructive pulmonary disease with acute lower respiratory infection: Secondary | ICD-10-CM | POA: Diagnosis not present

## 2016-01-16 DIAGNOSIS — J440 Chronic obstructive pulmonary disease with acute lower respiratory infection: Secondary | ICD-10-CM | POA: Diagnosis not present

## 2016-01-17 DIAGNOSIS — J440 Chronic obstructive pulmonary disease with acute lower respiratory infection: Secondary | ICD-10-CM | POA: Diagnosis not present

## 2016-01-18 DIAGNOSIS — J440 Chronic obstructive pulmonary disease with acute lower respiratory infection: Secondary | ICD-10-CM | POA: Diagnosis not present

## 2016-01-19 DIAGNOSIS — J440 Chronic obstructive pulmonary disease with acute lower respiratory infection: Secondary | ICD-10-CM | POA: Diagnosis not present

## 2016-01-20 DIAGNOSIS — J440 Chronic obstructive pulmonary disease with acute lower respiratory infection: Secondary | ICD-10-CM | POA: Diagnosis not present

## 2016-01-21 DIAGNOSIS — J440 Chronic obstructive pulmonary disease with acute lower respiratory infection: Secondary | ICD-10-CM | POA: Diagnosis not present

## 2016-01-22 DIAGNOSIS — J440 Chronic obstructive pulmonary disease with acute lower respiratory infection: Secondary | ICD-10-CM | POA: Diagnosis not present

## 2016-01-23 DIAGNOSIS — J440 Chronic obstructive pulmonary disease with acute lower respiratory infection: Secondary | ICD-10-CM | POA: Diagnosis not present

## 2016-01-23 DIAGNOSIS — J9611 Chronic respiratory failure with hypoxia: Secondary | ICD-10-CM | POA: Diagnosis not present

## 2016-01-23 DIAGNOSIS — J449 Chronic obstructive pulmonary disease, unspecified: Secondary | ICD-10-CM | POA: Diagnosis not present

## 2016-01-23 DIAGNOSIS — I10 Essential (primary) hypertension: Secondary | ICD-10-CM | POA: Diagnosis not present

## 2016-01-24 DIAGNOSIS — J440 Chronic obstructive pulmonary disease with acute lower respiratory infection: Secondary | ICD-10-CM | POA: Diagnosis not present

## 2016-01-25 DIAGNOSIS — J440 Chronic obstructive pulmonary disease with acute lower respiratory infection: Secondary | ICD-10-CM | POA: Diagnosis not present

## 2016-01-26 DIAGNOSIS — J440 Chronic obstructive pulmonary disease with acute lower respiratory infection: Secondary | ICD-10-CM | POA: Diagnosis not present

## 2016-01-27 DIAGNOSIS — J440 Chronic obstructive pulmonary disease with acute lower respiratory infection: Secondary | ICD-10-CM | POA: Diagnosis not present

## 2016-01-28 ENCOUNTER — Other Ambulatory Visit: Payer: Self-pay | Admitting: Family Medicine

## 2016-01-28 DIAGNOSIS — J440 Chronic obstructive pulmonary disease with acute lower respiratory infection: Secondary | ICD-10-CM | POA: Diagnosis not present

## 2016-01-28 DIAGNOSIS — R69 Illness, unspecified: Secondary | ICD-10-CM | POA: Diagnosis not present

## 2016-01-29 DIAGNOSIS — J440 Chronic obstructive pulmonary disease with acute lower respiratory infection: Secondary | ICD-10-CM | POA: Diagnosis not present

## 2016-01-30 DIAGNOSIS — J440 Chronic obstructive pulmonary disease with acute lower respiratory infection: Secondary | ICD-10-CM | POA: Diagnosis not present

## 2016-01-31 DIAGNOSIS — J440 Chronic obstructive pulmonary disease with acute lower respiratory infection: Secondary | ICD-10-CM | POA: Diagnosis not present

## 2016-02-01 DIAGNOSIS — J440 Chronic obstructive pulmonary disease with acute lower respiratory infection: Secondary | ICD-10-CM | POA: Diagnosis not present

## 2016-02-02 DIAGNOSIS — J440 Chronic obstructive pulmonary disease with acute lower respiratory infection: Secondary | ICD-10-CM | POA: Diagnosis not present

## 2016-02-03 ENCOUNTER — Telehealth: Payer: Self-pay

## 2016-02-03 DIAGNOSIS — J440 Chronic obstructive pulmonary disease with acute lower respiratory infection: Secondary | ICD-10-CM | POA: Diagnosis not present

## 2016-02-03 NOTE — Telephone Encounter (Signed)
Keep up with fluids Bland diet Imodium if needed per package instructions Call if not resolving in a few days

## 2016-02-03 NOTE — Telephone Encounter (Signed)
Patient and caretaker aware of advice.

## 2016-02-04 DIAGNOSIS — J440 Chronic obstructive pulmonary disease with acute lower respiratory infection: Secondary | ICD-10-CM | POA: Diagnosis not present

## 2016-02-05 DIAGNOSIS — J440 Chronic obstructive pulmonary disease with acute lower respiratory infection: Secondary | ICD-10-CM | POA: Diagnosis not present

## 2016-02-06 DIAGNOSIS — R0682 Tachypnea, not elsewhere classified: Secondary | ICD-10-CM | POA: Diagnosis not present

## 2016-02-06 DIAGNOSIS — J440 Chronic obstructive pulmonary disease with acute lower respiratory infection: Secondary | ICD-10-CM | POA: Diagnosis not present

## 2016-02-06 DIAGNOSIS — R69 Illness, unspecified: Secondary | ICD-10-CM | POA: Diagnosis not present

## 2016-02-06 DIAGNOSIS — I482 Chronic atrial fibrillation: Secondary | ICD-10-CM | POA: Diagnosis not present

## 2016-02-07 DIAGNOSIS — J440 Chronic obstructive pulmonary disease with acute lower respiratory infection: Secondary | ICD-10-CM | POA: Diagnosis not present

## 2016-02-08 DIAGNOSIS — J440 Chronic obstructive pulmonary disease with acute lower respiratory infection: Secondary | ICD-10-CM | POA: Diagnosis not present

## 2016-02-09 ENCOUNTER — Other Ambulatory Visit: Payer: Self-pay | Admitting: Family Medicine

## 2016-02-09 DIAGNOSIS — J440 Chronic obstructive pulmonary disease with acute lower respiratory infection: Secondary | ICD-10-CM | POA: Diagnosis not present

## 2016-02-10 DIAGNOSIS — J440 Chronic obstructive pulmonary disease with acute lower respiratory infection: Secondary | ICD-10-CM | POA: Diagnosis not present

## 2016-02-11 DIAGNOSIS — J440 Chronic obstructive pulmonary disease with acute lower respiratory infection: Secondary | ICD-10-CM | POA: Diagnosis not present

## 2016-02-11 DIAGNOSIS — J449 Chronic obstructive pulmonary disease, unspecified: Secondary | ICD-10-CM | POA: Diagnosis not present

## 2016-02-11 DIAGNOSIS — J9610 Chronic respiratory failure, unspecified whether with hypoxia or hypercapnia: Secondary | ICD-10-CM | POA: Diagnosis not present

## 2016-02-12 DIAGNOSIS — J440 Chronic obstructive pulmonary disease with acute lower respiratory infection: Secondary | ICD-10-CM | POA: Diagnosis not present

## 2016-02-13 DIAGNOSIS — J440 Chronic obstructive pulmonary disease with acute lower respiratory infection: Secondary | ICD-10-CM | POA: Diagnosis not present

## 2016-02-14 DIAGNOSIS — J440 Chronic obstructive pulmonary disease with acute lower respiratory infection: Secondary | ICD-10-CM | POA: Diagnosis not present

## 2016-02-15 DIAGNOSIS — J440 Chronic obstructive pulmonary disease with acute lower respiratory infection: Secondary | ICD-10-CM | POA: Diagnosis not present

## 2016-02-16 DIAGNOSIS — J440 Chronic obstructive pulmonary disease with acute lower respiratory infection: Secondary | ICD-10-CM | POA: Diagnosis not present

## 2016-02-17 DIAGNOSIS — J440 Chronic obstructive pulmonary disease with acute lower respiratory infection: Secondary | ICD-10-CM | POA: Diagnosis not present

## 2016-02-18 DIAGNOSIS — J440 Chronic obstructive pulmonary disease with acute lower respiratory infection: Secondary | ICD-10-CM | POA: Diagnosis not present

## 2016-02-19 DIAGNOSIS — J440 Chronic obstructive pulmonary disease with acute lower respiratory infection: Secondary | ICD-10-CM | POA: Diagnosis not present

## 2016-02-20 DIAGNOSIS — J440 Chronic obstructive pulmonary disease with acute lower respiratory infection: Secondary | ICD-10-CM | POA: Diagnosis not present

## 2016-02-21 DIAGNOSIS — J440 Chronic obstructive pulmonary disease with acute lower respiratory infection: Secondary | ICD-10-CM | POA: Diagnosis not present

## 2016-02-22 DIAGNOSIS — J440 Chronic obstructive pulmonary disease with acute lower respiratory infection: Secondary | ICD-10-CM | POA: Diagnosis not present

## 2016-02-23 ENCOUNTER — Other Ambulatory Visit: Payer: Self-pay | Admitting: Family Medicine

## 2016-02-23 DIAGNOSIS — J440 Chronic obstructive pulmonary disease with acute lower respiratory infection: Secondary | ICD-10-CM | POA: Diagnosis not present

## 2016-02-23 NOTE — Telephone Encounter (Signed)
Seen 01/07/16

## 2016-02-24 DIAGNOSIS — J440 Chronic obstructive pulmonary disease with acute lower respiratory infection: Secondary | ICD-10-CM | POA: Diagnosis not present

## 2016-02-25 ENCOUNTER — Ambulatory Visit: Payer: Medicare HMO | Admitting: Podiatry

## 2016-02-25 DIAGNOSIS — J440 Chronic obstructive pulmonary disease with acute lower respiratory infection: Secondary | ICD-10-CM | POA: Diagnosis not present

## 2016-02-26 DIAGNOSIS — J440 Chronic obstructive pulmonary disease with acute lower respiratory infection: Secondary | ICD-10-CM | POA: Diagnosis not present

## 2016-02-27 DIAGNOSIS — J440 Chronic obstructive pulmonary disease with acute lower respiratory infection: Secondary | ICD-10-CM | POA: Diagnosis not present

## 2016-02-28 DIAGNOSIS — J440 Chronic obstructive pulmonary disease with acute lower respiratory infection: Secondary | ICD-10-CM | POA: Diagnosis not present

## 2016-02-28 DIAGNOSIS — R69 Illness, unspecified: Secondary | ICD-10-CM | POA: Diagnosis not present

## 2016-02-29 DIAGNOSIS — J440 Chronic obstructive pulmonary disease with acute lower respiratory infection: Secondary | ICD-10-CM | POA: Diagnosis not present

## 2016-03-01 DIAGNOSIS — J440 Chronic obstructive pulmonary disease with acute lower respiratory infection: Secondary | ICD-10-CM | POA: Diagnosis not present

## 2016-03-02 DIAGNOSIS — J440 Chronic obstructive pulmonary disease with acute lower respiratory infection: Secondary | ICD-10-CM | POA: Diagnosis not present

## 2016-03-03 ENCOUNTER — Ambulatory Visit (INDEPENDENT_AMBULATORY_CARE_PROVIDER_SITE_OTHER): Payer: Medicare HMO | Admitting: Podiatry

## 2016-03-03 ENCOUNTER — Encounter: Payer: Self-pay | Admitting: Podiatry

## 2016-03-03 DIAGNOSIS — M79674 Pain in right toe(s): Secondary | ICD-10-CM

## 2016-03-03 DIAGNOSIS — J440 Chronic obstructive pulmonary disease with acute lower respiratory infection: Secondary | ICD-10-CM | POA: Diagnosis not present

## 2016-03-03 DIAGNOSIS — B351 Tinea unguium: Secondary | ICD-10-CM

## 2016-03-03 DIAGNOSIS — M79675 Pain in left toe(s): Secondary | ICD-10-CM

## 2016-03-03 NOTE — Patient Instructions (Signed)

## 2016-03-04 DIAGNOSIS — J440 Chronic obstructive pulmonary disease with acute lower respiratory infection: Secondary | ICD-10-CM | POA: Diagnosis not present

## 2016-03-04 NOTE — Progress Notes (Signed)
Patient ID: Sheila Pierce, female   DOB: 05-16-1945, 71 y.o.   MRN: [id number]   Subjective: This patient presents today for scheduled visit for debridement of toenails which are uncomfortable with direct shoe pressure. Patient has a long history of difficulty with thickened toenails and presented initially on the visit of 11/26/2015 for diabetic evaluation and nail debridement Patient is a diabetic and denies any history of foot ulceration, claudication or amputation Patient previous smoker The patient is transported to the office with her son, transportation person an intern present in the treatment room  Objective: Patient transfers from wheelchair to treatment table  Patient appears orientated 3  Vascular: No peripheral edema or calf tenderness bilaterally DP pulses 1/4 bilaterally PT pulses 2/4 bilaterally Capillary reflex delay bilaterally  Neurological: Sensation to 10 g monofilament wire intact 4/5 bilaterally Vibratory sensation reactive bilaterally Ankle reflex equal reactive bilaterally  Dermatological: No open skin lesions bilaterally The toenails are elongated, brittle, deformed, discolored and tender to direct palpation 6-10  Musculoskeletal: Patient transfer from wheelchair to treatment chair Hammertoes fifth bilaterally Living second third toes bilaterally Manual motor testing dorsi flexion, plantar flexion, inversion, eversion    Assessment: Diabetic with satisfactory neurovascular status  symptomatic onychomycoses 6-10  Plan: The toenails 6-10 were debrided mechanically analytic without any bleeding  Reappoint 3 months

## 2016-03-05 DIAGNOSIS — J440 Chronic obstructive pulmonary disease with acute lower respiratory infection: Secondary | ICD-10-CM | POA: Diagnosis not present

## 2016-03-08 DIAGNOSIS — J440 Chronic obstructive pulmonary disease with acute lower respiratory infection: Secondary | ICD-10-CM | POA: Diagnosis not present

## 2016-03-09 ENCOUNTER — Ambulatory Visit: Payer: Self-pay | Admitting: Family Medicine

## 2016-03-09 ENCOUNTER — Other Ambulatory Visit: Payer: Self-pay

## 2016-03-09 DIAGNOSIS — J440 Chronic obstructive pulmonary disease with acute lower respiratory infection: Secondary | ICD-10-CM | POA: Diagnosis not present

## 2016-03-09 MED ORDER — ALBUTEROL SULFATE HFA 108 (90 BASE) MCG/ACT IN AERS: 2.0000 | INHALATION_SPRAY | Freq: Four times a day (QID) | RESPIRATORY_TRACT | 1 refills | Status: DC | PRN

## 2016-03-11 DIAGNOSIS — R609 Edema, unspecified: Secondary | ICD-10-CM | POA: Diagnosis not present

## 2016-03-11 DIAGNOSIS — G909 Disorder of the autonomic nervous system, unspecified: Secondary | ICD-10-CM | POA: Diagnosis not present

## 2016-03-11 DIAGNOSIS — Z0000 Encounter for general adult medical examination without abnormal findings: Secondary | ICD-10-CM | POA: Diagnosis not present

## 2016-03-11 DIAGNOSIS — Z87891 Personal history of nicotine dependence: Secondary | ICD-10-CM | POA: Diagnosis not present

## 2016-03-11 DIAGNOSIS — E119 Type 2 diabetes mellitus without complications: Secondary | ICD-10-CM | POA: Diagnosis not present

## 2016-03-11 DIAGNOSIS — Z993 Dependence on wheelchair: Secondary | ICD-10-CM | POA: Diagnosis not present

## 2016-03-11 DIAGNOSIS — Z7952 Long term (current) use of systemic steroids: Secondary | ICD-10-CM | POA: Diagnosis not present

## 2016-03-11 DIAGNOSIS — I10 Essential (primary) hypertension: Secondary | ICD-10-CM | POA: Diagnosis not present

## 2016-03-11 DIAGNOSIS — R69 Illness, unspecified: Secondary | ICD-10-CM | POA: Diagnosis not present

## 2016-03-11 DIAGNOSIS — Z9981 Dependence on supplemental oxygen: Secondary | ICD-10-CM | POA: Diagnosis not present

## 2016-03-11 DIAGNOSIS — Z79891 Long term (current) use of opiate analgesic: Secondary | ICD-10-CM | POA: Diagnosis not present

## 2016-03-11 DIAGNOSIS — E876 Hypokalemia: Secondary | ICD-10-CM | POA: Diagnosis not present

## 2016-03-11 DIAGNOSIS — J449 Chronic obstructive pulmonary disease, unspecified: Secondary | ICD-10-CM | POA: Diagnosis not present

## 2016-03-13 DIAGNOSIS — J9610 Chronic respiratory failure, unspecified whether with hypoxia or hypercapnia: Secondary | ICD-10-CM | POA: Diagnosis not present

## 2016-03-13 DIAGNOSIS — J449 Chronic obstructive pulmonary disease, unspecified: Secondary | ICD-10-CM | POA: Diagnosis not present

## 2016-03-15 DIAGNOSIS — J440 Chronic obstructive pulmonary disease with acute lower respiratory infection: Secondary | ICD-10-CM | POA: Diagnosis not present

## 2016-03-16 DIAGNOSIS — J440 Chronic obstructive pulmonary disease with acute lower respiratory infection: Secondary | ICD-10-CM | POA: Diagnosis not present

## 2016-03-17 DIAGNOSIS — J440 Chronic obstructive pulmonary disease with acute lower respiratory infection: Secondary | ICD-10-CM | POA: Diagnosis not present

## 2016-03-18 ENCOUNTER — Encounter: Payer: Self-pay | Admitting: Family Medicine

## 2016-03-18 ENCOUNTER — Ambulatory Visit (INDEPENDENT_AMBULATORY_CARE_PROVIDER_SITE_OTHER): Payer: Medicare HMO | Admitting: Family Medicine

## 2016-03-18 VITALS — BP 118/68 | HR 76 | Temp 97.2°F | Resp 24 | Ht 60.0 in | Wt 139.1 lb

## 2016-03-18 DIAGNOSIS — F419 Anxiety disorder, unspecified: Secondary | ICD-10-CM | POA: Diagnosis not present

## 2016-03-18 DIAGNOSIS — Z9981 Dependence on supplemental oxygen: Secondary | ICD-10-CM | POA: Diagnosis not present

## 2016-03-18 DIAGNOSIS — J431 Panlobular emphysema: Secondary | ICD-10-CM | POA: Diagnosis not present

## 2016-03-18 DIAGNOSIS — Z659 Problem related to unspecified psychosocial circumstances: Secondary | ICD-10-CM

## 2016-03-18 DIAGNOSIS — J440 Chronic obstructive pulmonary disease with acute lower respiratory infection: Secondary | ICD-10-CM | POA: Diagnosis not present

## 2016-03-18 DIAGNOSIS — R69 Illness, unspecified: Secondary | ICD-10-CM | POA: Diagnosis not present

## 2016-03-18 NOTE — Progress Notes (Signed)
Chief Complaint  Patient presents with  . Follow-up    2 month   Here today specifically asking for a note saying she is mentally competent to pay her won bills and care for own finances.  Her son currently does this and has been irresponsible, causing potential interruption of services at the home.  Her mental status examination is normal.  She requests a letter that her son helps care for her so he can get food stamps.  She has an aid 80 hours a month, so he helps part time.  Her breathing is stable.  No recent infection or exacerbation of her COPD.  She has 3 sons.  Mardelle Matte lives independently and does not help much or see her often.  [name] works nights and lives at her home but does not help with the bills and "spends my money".  Scott lives with her and is not employed, and does not receive assistance.  He is there to care for her but is "mean to me and makes me cry". And he "yells at me".  She does not want him to leave, because with no job and no place to live, he risks going back to prison.  We discussed she might be better off in a nursing home.  She does not want to leave her home.  She is compliant with her medicine and inhalers.  Needs no refills today.  She says her "nerves are bad".  Takes buspar for anxiety.   Patient Active Problem List   Diagnosis Date Noted  . COPD (chronic obstructive pulmonary disease) (HCC) 12/19/2015  . Acute on chronic respiratory failure with hypoxia (HCC) 12/19/2015  . Hypercholesteremia 12/16/2015  . Osteoporosis 12/01/2015  . Colonoscopy refused 12/01/2015  . Macular degeneration, age related, nonexudative 11/26/2015  . Cataract incipient, senile, bilateral 11/26/2015  . Onychomycosis of toenail 11/11/2015  . Aortic atherosclerosis (HCC) 11/11/2015  . Abnormal thyroid function test 11/06/2015  . Diabetes mellitus, stable (HCC) 11/05/2015  . COPD exacerbation (HCC) 11/04/2015  . AKI (acute kidney injury) (HCC) 11/03/2015  . Normocytic  anemia 11/03/2015  . Anxiety 11/03/2015  . Acute exacerbation of chronic obstructive pulmonary disease (COPD) (HCC) 09/19/2012  . CAD (coronary artery disease) 11/24/2011  . Essential hypertension   . QT prolongation   . MI, acute, non ST segment elevation (HCC) 10/05/2011    Outpatient Encounter Prescriptions as of 03/18/2016  Medication Sig  . albuterol (VENTOLIN HFA) 108 (90 Base) MCG/ACT inhaler Inhale 2 puffs into the lungs every 6 (six) hours as needed for wheezing or shortness of breath.  Marland Kitchen aspirin 81 MG tablet Take 81 mg by mouth daily.  . beclomethasone (QVAR) 40 MCG/ACT inhaler Inhale 1 puff into the lungs 2 (two) times daily.  Marland Kitchen BREO ELLIPTA 100-25 MCG/INH AEPB Inhale 1 puff into the lungs daily.  . budesonide (PULMICORT) 0.25 MG/2ML nebulizer solution Take 2 mLs (0.25 mg total) by nebulization 2 (two) times daily.  . busPIRone (BUSPAR) 15 MG tablet TAKE 1 TABLET BY MOUTH TWICE DAILY  . Cholecalciferol 1000 units capsule Take 1,000 Units by mouth daily.   Marland Kitchen glucose blood (ONE TOUCH ULTRA TEST) test strip Use 4 x daily as directed. e11.65  . ipratropium-albuterol (DUONEB) 0.5-2.5 (3) MG/3ML SOLN Take 3 mLs by nebulization every 4 (four) hours.  Marland Kitchen lisinopril-hydrochlorothiazide (PRINZIDE,ZESTORETIC) 10-12.5 MG tablet Take 1 tablet by mouth daily.  . metFORMIN (GLUCOPHAGE) 500 MG tablet Take 1 tablet (500 mg total) by mouth 2 (two) times daily with  a meal.  . metoprolol tartrate (LOPRESSOR) 25 MG tablet Take 1 tablet (25 mg total) by mouth 2 (two) times daily.  . Multiple Vitamin (MULTIVITAMIN WITH MINERALS) TABS tablet Take 1 tablet by mouth daily.  . potassium chloride (K-DUR) 10 MEQ tablet Take 10 mEq by mouth daily.  Marland Kitchen tiotropium (SPIRIVA) 18 MCG inhalation capsule Place 18 mcg into inhaler and inhale daily.  . traMADol (ULTRAM) 50 MG tablet Take 1 tablet (50 mg total) by mouth every 6 (six) hours as needed for moderate pain or severe pain.  Marland Kitchen acetaZOLAMIDE (DIAMOX) 250 MG  tablet Take 1 tablet (250 mg total) by mouth 2 (two) times daily.   No facility-administered encounter medications on file as of 03/18/2016.     Allergies  Allergen Reactions  . Penicillins Swelling  . Sulfa Antibiotics Hives    Hallucinations   . Codeine Nausea Only  . Hydrocodone Nausea Only    Review of Systems  Constitutional: Positive for fatigue. Negative for unexpected weight change.  HENT: Negative for congestion, postnasal drip, rhinorrhea and sinus pressure.   Eyes: Negative for visual disturbance.  Respiratory: Positive for cough, shortness of breath and wheezing.        Per patient baseline  Cardiovascular: Negative for chest pain, palpitations and leg swelling.  Gastrointestinal: Negative for blood in stool, constipation and diarrhea.  Genitourinary: Negative for difficulty urinating and frequency.  Musculoskeletal: Positive for back pain and myalgias.       Hurt all over  Skin: Negative.   Neurological: Positive for weakness and light-headedness.       Still with positional dizziness, weakness.  Spends day in bed or chair  Psychiatric/Behavioral: Positive for dysphoric mood and sleep disturbance. The patient is nervous/anxious.     BP 118/68 (BP Location: Left Arm, Patient Position: Sitting, Cuff Size: Normal)   Pulse 76   Temp 97.2 F (36.2 C) (Temporal)   Resp (!) 24   Ht 5' (1.524 m)   Wt 139 lb 1.3 oz (63.1 kg)   SpO2 100% Comment: o2 3 lpm via n/c  BMI 27.16 kg/m   Physical Exam  Constitutional: She is oriented to person, place, and time. She appears well-developed and well-nourished.  Short of breath. In wheelchair. Oxygen dependent. Appears fatigued.  HENT:  Head: Normocephalic and atraumatic.  Right Ear: External ear normal.  Left Ear: External ear normal.  Mouth/Throat: Oropharynx is clear and moist.  Edentulous. Mucous membranes dry  Eyes: Conjunctivae are normal. Pupils are equal, round, and reactive to light.  Neck: Normal range of motion.  Neck supple. No thyromegaly present.  Cardiovascular: Normal rate, regular rhythm and normal heart sounds.   Pulmonary/Chest: She has no wheezes. She has no rales.  Decreased breath sounds. Tachypnea. No rales or wheeze  Abdominal: Soft. Bowel sounds are normal. There is no tenderness.  Musculoskeletal: Normal range of motion. She exhibits no edema.  Lymphadenopathy:    She has no cervical adenopathy.  Neurological: She is alert and oriented to person, place, and time.  Skin: Skin is warm and dry.  Psychiatric: Her behavior is normal.  Depressed affect.  Normal cognition    ASSESSMENT/PLAN:  1. Oxygen dependent  2. Panlobular emphysema (HCC)  3. Poor social situation  4. anxiety   Patient Instructions  Need blood work and a follow up with me in April  I will file forms with social security  Drink plenty of water   [name], MD

## 2016-03-18 NOTE — Patient Instructions (Signed)
Need blood work and a follow up with me in April  I will file forms with social security  Drink plenty of water

## 2016-03-19 DIAGNOSIS — J440 Chronic obstructive pulmonary disease with acute lower respiratory infection: Secondary | ICD-10-CM | POA: Diagnosis not present

## 2016-03-20 DIAGNOSIS — J440 Chronic obstructive pulmonary disease with acute lower respiratory infection: Secondary | ICD-10-CM | POA: Diagnosis not present

## 2016-03-21 DIAGNOSIS — J440 Chronic obstructive pulmonary disease with acute lower respiratory infection: Secondary | ICD-10-CM | POA: Diagnosis not present

## 2016-03-22 ENCOUNTER — Other Ambulatory Visit: Payer: Self-pay | Admitting: Family Medicine

## 2016-03-22 DIAGNOSIS — J440 Chronic obstructive pulmonary disease with acute lower respiratory infection: Secondary | ICD-10-CM | POA: Diagnosis not present

## 2016-03-23 DIAGNOSIS — J440 Chronic obstructive pulmonary disease with acute lower respiratory infection: Secondary | ICD-10-CM | POA: Diagnosis not present

## 2016-03-24 DIAGNOSIS — J440 Chronic obstructive pulmonary disease with acute lower respiratory infection: Secondary | ICD-10-CM | POA: Diagnosis not present

## 2016-03-25 ENCOUNTER — Other Ambulatory Visit: Payer: Self-pay

## 2016-03-25 DIAGNOSIS — J440 Chronic obstructive pulmonary disease with acute lower respiratory infection: Secondary | ICD-10-CM | POA: Diagnosis not present

## 2016-03-25 DIAGNOSIS — R69 Illness, unspecified: Secondary | ICD-10-CM | POA: Diagnosis not present

## 2016-03-25 MED ORDER — POTASSIUM CHLORIDE ER 10 MEQ PO TBCR: 10.0000 meq | EXTENDED_RELEASE_TABLET | Freq: Every day | ORAL | 3 refills | Status: DC

## 2016-03-26 ENCOUNTER — Other Ambulatory Visit: Payer: Self-pay

## 2016-03-26 DIAGNOSIS — J440 Chronic obstructive pulmonary disease with acute lower respiratory infection: Secondary | ICD-10-CM | POA: Diagnosis not present

## 2016-03-27 DIAGNOSIS — J440 Chronic obstructive pulmonary disease with acute lower respiratory infection: Secondary | ICD-10-CM | POA: Diagnosis not present

## 2016-03-28 DIAGNOSIS — J440 Chronic obstructive pulmonary disease with acute lower respiratory infection: Secondary | ICD-10-CM | POA: Diagnosis not present

## 2016-03-29 DIAGNOSIS — J440 Chronic obstructive pulmonary disease with acute lower respiratory infection: Secondary | ICD-10-CM | POA: Diagnosis not present

## 2016-03-30 DIAGNOSIS — J440 Chronic obstructive pulmonary disease with acute lower respiratory infection: Secondary | ICD-10-CM | POA: Diagnosis not present

## 2016-03-31 DIAGNOSIS — J440 Chronic obstructive pulmonary disease with acute lower respiratory infection: Secondary | ICD-10-CM | POA: Diagnosis not present

## 2016-04-01 ENCOUNTER — Other Ambulatory Visit: Payer: Self-pay | Admitting: "Endocrinology

## 2016-04-01 DIAGNOSIS — E119 Type 2 diabetes mellitus without complications: Secondary | ICD-10-CM | POA: Diagnosis not present

## 2016-04-01 DIAGNOSIS — J440 Chronic obstructive pulmonary disease with acute lower respiratory infection: Secondary | ICD-10-CM | POA: Diagnosis not present

## 2016-04-02 DIAGNOSIS — J440 Chronic obstructive pulmonary disease with acute lower respiratory infection: Secondary | ICD-10-CM | POA: Diagnosis not present

## 2016-04-02 LAB — LIPID PANEL W/O CHOL/HDL RATIO
Cholesterol, Total: 200 mg/dL — ABNORMAL HIGH (ref 100–199)
HDL: 55 mg/dL (ref >39)
LDL Calculated: 118 mg/dL — ABNORMAL HIGH (ref 0–99)
Triglycerides: 135 mg/dL (ref 0–149)
VLDL CHOLESTEROL CAL: 27 mg/dL (ref 5–40)

## 2016-04-02 LAB — COMPREHENSIVE METABOLIC PANEL
A/G RATIO: 1.7 (ref 1.2–2.2)
ALBUMIN: 4 g/dL (ref 3.5–4.8)
ALT: 13 IU/L (ref 0–32)
AST: 13 IU/L (ref 0–40)
Alkaline Phosphatase: 68 IU/L (ref 39–117)
BILIRUBIN TOTAL: 0.2 mg/dL (ref 0.0–1.2)
BUN / CREAT RATIO: 18 (ref 12–28)
BUN: 23 mg/dL (ref 8–27)
CHLORIDE: 94 mmol/L — AB (ref 96–106)
CO2: 30 mmol/L — ABNORMAL HIGH (ref 18–29)
Calcium: 9.6 mg/dL (ref 8.7–10.3)
Creatinine, Ser: 1.25 mg/dL — ABNORMAL HIGH (ref 0.57–1.00)
GFR calc non Af Amer: 43 mL/min/{1.73_m2} — ABNORMAL LOW (ref >59)
GFR, EST AFRICAN AMERICAN: 50 mL/min/{1.73_m2} — AB (ref >59)
GLUCOSE: 103 mg/dL — AB (ref 65–99)
Globulin, Total: 2.4 g/dL (ref 1.5–4.5)
Potassium: 3.8 mmol/L (ref 3.5–5.2)
Sodium: 136 mmol/L (ref 134–144)
TOTAL PROTEIN: 6.4 g/dL (ref 6.0–8.5)

## 2016-04-02 LAB — HGB A1C W/O EAG: Hgb A1c MFr Bld: 5.1 % (ref 4.8–5.6)

## 2016-04-02 LAB — AMBIG ABBREV LP DEFAULT

## 2016-04-02 LAB — TSH: TSH: 3.86 u[IU]/mL (ref 0.450–4.500)

## 2016-04-02 LAB — AMBIG ABBREV CMP14 DEFAULT

## 2016-04-02 LAB — T4, FREE: Free T4: 1.23 ng/dL (ref 0.82–1.77)

## 2016-04-02 LAB — MICROALBUMIN / CREATININE URINE RATIO
CREATININE, UR: 152.2 mg/dL
MICROALB/CREAT RATIO: 7.5 mg/g{creat} (ref 0.0–30.0)
MICROALBUM., U, RANDOM: 11.4 ug/mL

## 2016-04-03 DIAGNOSIS — J440 Chronic obstructive pulmonary disease with acute lower respiratory infection: Secondary | ICD-10-CM | POA: Diagnosis not present

## 2016-04-04 DIAGNOSIS — J440 Chronic obstructive pulmonary disease with acute lower respiratory infection: Secondary | ICD-10-CM | POA: Diagnosis not present

## 2016-04-05 DIAGNOSIS — J440 Chronic obstructive pulmonary disease with acute lower respiratory infection: Secondary | ICD-10-CM | POA: Diagnosis not present

## 2016-04-06 DIAGNOSIS — J440 Chronic obstructive pulmonary disease with acute lower respiratory infection: Secondary | ICD-10-CM | POA: Diagnosis not present

## 2016-04-07 DIAGNOSIS — J440 Chronic obstructive pulmonary disease with acute lower respiratory infection: Secondary | ICD-10-CM | POA: Diagnosis not present

## 2016-04-08 ENCOUNTER — Encounter: Payer: Self-pay | Admitting: "Endocrinology

## 2016-04-08 ENCOUNTER — Ambulatory Visit (INDEPENDENT_AMBULATORY_CARE_PROVIDER_SITE_OTHER): Payer: Medicare HMO

## 2016-04-08 ENCOUNTER — Ambulatory Visit (INDEPENDENT_AMBULATORY_CARE_PROVIDER_SITE_OTHER): Payer: Medicare HMO | Admitting: "Endocrinology

## 2016-04-08 VITALS — BP 120/78 | HR 82 | Temp 97.9°F | Ht 60.0 in | Wt 140.1 lb

## 2016-04-08 VITALS — BP 123/79 | HR 80 | Ht 60.0 in

## 2016-04-08 DIAGNOSIS — Z0000 Encounter for general adult medical examination without abnormal findings: Secondary | ICD-10-CM | POA: Diagnosis not present

## 2016-04-08 DIAGNOSIS — J440 Chronic obstructive pulmonary disease with acute lower respiratory infection: Secondary | ICD-10-CM | POA: Diagnosis not present

## 2016-04-08 DIAGNOSIS — E119 Type 2 diabetes mellitus without complications: Secondary | ICD-10-CM | POA: Diagnosis not present

## 2016-04-08 DIAGNOSIS — E7800 Pure hypercholesterolemia, unspecified: Secondary | ICD-10-CM | POA: Diagnosis not present

## 2016-04-08 DIAGNOSIS — I10 Essential (primary) hypertension: Secondary | ICD-10-CM

## 2016-04-08 NOTE — Patient Instructions (Addendum)
Sheila Pierce , Thank you for taking time to come for your Medicare Wellness Visit. I appreciate your ongoing commitment to your health goals. Please review the following plan we discussed and let me know if I can assist you in the future.   Screening recommendations/referrals: I have sent in a community resource referral for you today to assist you with your medication, housing, wheelchair, and a new mattress for your bed. You should receive a call from our care guide Amy within the next 2-4 weeks. Colonoscopy, Mammogram, Bone Density, and Hepatitis C screening are due at this time. However, Dr. Delton See does not recommend you have these screenings due to your advanced health conditions. Recommended yearly ophthalmology/optometry visit for glaucoma screening and checkup  Vaccinations: Influenza vaccine: Up to date, Due 10/2016 Pneumococcal vaccine: Up to date Tdap vaccine: Up to date  Advanced directives: Advance directive discussed with you today. I have provided a copy for you to complete at home and have notarized. Once this is complete please bring a copy in to our office so we can scan it into your chart.  Conditions/risks identified: Possible depression, recommend follow up with Dr. Delton See. If your feelings worsen before your next appointment on 4/12, please call our office for a sooner appointment.   Next appointment: Follow up with Dr. Delton See on 05/06/2016 at 11:00 am. Follow up in 1 year for your annual wellness visit.    Preventive Care 70 Years and Older, Female Preventive care refers to lifestyle choices and visits with your health care provider that can promote health and wellness. What does preventive care include?  A yearly physical exam. This is also called an annual well check.  Dental exams once or twice a year.  Routine eye exams. Ask your health care provider how often you should have your eyes checked.  Personal lifestyle choices, including:  Daily care of your teeth and  gums.  Regular physical activity.  Eating a healthy diet.  Avoiding tobacco and drug use.  Limiting alcohol use.  Practicing safe sex.  Taking low-dose aspirin every day.  Taking vitamin and mineral supplements as recommended by your health care provider. What happens during an annual well check? The services and screenings done by your health care provider during your annual well check will depend on your age, overall health, lifestyle risk factors, and family history of disease. Counseling  Your health care provider may ask you questions about your:  Alcohol use.  Tobacco use.  Drug use.  Emotional well-being.  Home and relationship well-being.  Sexual activity.  Eating habits.  History of falls.  Memory and ability to understand (cognition).  Work and work Astronomer.  Reproductive health. Screening  You may have the following tests or measurements:  Height, weight, and BMI.  Blood pressure.  Lipid and cholesterol levels. These may be checked every 5 years, or more frequently if you are over 110 years old.  Skin check.  Lung cancer screening. You may have this screening every year starting at age 79 if you have a 30-pack-year history of smoking and currently smoke or have quit within the past 15 years.  Fecal occult blood test (FOBT) of the stool. You may have this test every year starting at age 64.  Flexible sigmoidoscopy or colonoscopy. You may have a sigmoidoscopy every 5 years or a colonoscopy every 10 years starting at age 51.  Hepatitis C blood test.  Hepatitis B blood test.  Sexually transmitted disease (STD) testing.  Diabetes screening. This  is done by checking your blood sugar (glucose) after you have not eaten for a while (fasting). You may have this done every 1-3 years.  Bone density scan. This is done to screen for osteoporosis. You may have this done starting at age 23.  Mammogram. This may be done every 1-2 years. Talk to your  health care provider about how often you should have regular mammograms. Talk with your health care provider about your test results, treatment options, and if necessary, the need for more tests. Vaccines  Your health care provider may recommend certain vaccines, such as:  Influenza vaccine. This is recommended every year.  Tetanus, diphtheria, and acellular pertussis (Tdap, Td) vaccine. You may need a Td booster every 10 years.  Zoster vaccine. You may need this after age 49.  Pneumococcal 13-valent conjugate (PCV13) vaccine. One dose is recommended after age 36.  Pneumococcal polysaccharide (PPSV23) vaccine. One dose is recommended after age 58. Talk to your health care provider about which screenings and vaccines you need and how often you need them. This information is not intended to replace advice given to you by your health care provider. Make sure you discuss any questions you have with your health care provider. Document Released: 02/07/2015 Document Revised: 10/01/2015 Document Reviewed: 11/12/2014 Elsevier Interactive Patient Education  2017 [employer].  Fall Prevention in the Home Falls can cause injuries. They can happen to people of all ages. There are many things you can do to make your home safe and to help prevent falls. What can I do on the outside of my home?  Regularly fix the edges of walkways and driveways and fix any cracks.  Remove anything that might make you trip as you walk through a door, such as a raised step or threshold.  Trim any bushes or trees on the path to your home.  Use bright outdoor lighting.  Clear any walking paths of anything that might make someone trip, such as rocks or tools.  Regularly check to see if handrails are loose or broken. Make sure that both sides of any steps have handrails.  Any raised decks and porches should have guardrails on the edges.  Have any leaves, snow, or ice cleared regularly.  Use sand or salt on walking  paths during winter.  Clean up any spills in your garage right away. This includes oil or grease spills. What can I do in the bathroom?  Use night lights.  Install grab bars by the toilet and in the tub and shower. Do not use towel bars as grab bars.  Use non-skid mats or decals in the tub or shower.  If you need to sit down in the shower, use a plastic, non-slip stool.  Keep the floor dry. Clean up any water that spills on the floor as soon as it happens.  Remove soap buildup in the tub or shower regularly.  Attach bath mats securely with double-sided non-slip rug tape.  Do not have throw rugs and other things on the floor that can make you trip. What can I do in the bedroom?  Use night lights.  Make sure that you have a light by your bed that is easy to reach.  Do not use any sheets or blankets that are too big for your bed. They should not hang down onto the floor.  Have a firm chair that has side arms. You can use this for support while you get dressed.  Do not have throw rugs and other  things on the floor that can make you trip. What can I do in the kitchen?  Clean up any spills right away.  Avoid walking on wet floors.  Keep items that you use a lot in easy-to-reach places.  If you need to reach something above you, use a strong step stool that has a grab bar.  Keep electrical cords out of the way.  Do not use floor polish or wax that makes floors slippery. If you must use wax, use non-skid floor wax.  Do not have throw rugs and other things on the floor that can make you trip. What can I do with my stairs?  Do not leave any items on the stairs.  Make sure that there are handrails on both sides of the stairs and use them. Fix handrails that are broken or loose. Make sure that handrails are as long as the stairways.  Check any carpeting to make sure that it is firmly attached to the stairs. Fix any carpet that is loose or worn.  Avoid having throw rugs at  the top or bottom of the stairs. If you do have throw rugs, attach them to the floor with carpet tape.  Make sure that you have a light switch at the top of the stairs and the bottom of the stairs. If you do not have them, ask someone to add them for you. What else can I do to help prevent falls?  Wear shoes that:  Do not have high heels.  Have rubber bottoms.  Are comfortable and fit you well.  Are closed at the toe. Do not wear sandals.  If you use a stepladder:  Make sure that it is fully opened. Do not climb a closed stepladder.  Make sure that both sides of the stepladder are locked into place.  Ask someone to hold it for you, if possible.  Clearly mark and make sure that you can see:  Any grab bars or handrails.  First and last steps.  Where the edge of each step is.  Use tools that help you move around (mobility aids) if they are needed. These include:  Canes.  Walkers.  Scooters.  Crutches.  Turn on the lights when you go into a dark area. Replace any light bulbs as soon as they burn out.  Set up your furniture so you have a clear path. Avoid moving your furniture around.  If any of your floors are uneven, fix them.  If there are any pets around you, be aware of where they are.  Review your medicines with your doctor. Some medicines can make you feel dizzy. This can increase your chance of falling. Ask your doctor what other things that you can do to help prevent falls. This information is not intended to replace advice given to you by your health care provider. Make sure you discuss any questions you have with your health care provider. Document Released: 11/07/2008 Document Revised: 06/19/2015 Document Reviewed: 02/15/2014 Elsevier Interactive Patient Education  2017 [employer].

## 2016-04-08 NOTE — Progress Notes (Signed)
Subjective:   Sheila Pierce is a 71 y.o. female who presents for an Initial Medicare Annual Wellness Visit.  Review of Systems: Cardiac Risk Factors include: advanced age (>40men, >15 women);diabetes mellitus;dyslipidemia;hypertension;sedentary lifestyle     Objective:    Today's Vitals   04/08/16 1547 04/08/16 1551  BP: 120/78   Pulse: 82   Temp: 97.9 F (36.6 C)   TempSrc: Oral   SpO2: 97% 97%  Weight: 140 lb 1.9 oz (63.6 kg)   Height: 5' (1.524 m)    Body mass index is 27.37 kg/m.   Current Medications (verified) Outpatient Encounter Prescriptions as of 04/08/2016  Medication Sig  . albuterol (VENTOLIN HFA) 108 (90 Base) MCG/ACT inhaler Inhale 2 puffs into the lungs every 6 (six) hours as needed for wheezing or shortness of breath.  Marland Kitchen aspirin 81 MG tablet Take 81 mg by mouth daily.  Marland Kitchen BREO ELLIPTA 100-25 MCG/INH AEPB Inhale 1 puff into the lungs daily.  . budesonide (PULMICORT) 0.25 MG/2ML nebulizer solution Take 2 mLs (0.25 mg total) by nebulization 2 (two) times daily.  . busPIRone (BUSPAR) 15 MG tablet TAKE 1 TABLET BY MOUTH TWICE DAILY  . Cholecalciferol 1000 units capsule Take 1,000 Units by mouth daily.   Marland Kitchen ipratropium-albuterol (DUONEB) 0.5-2.5 (3) MG/3ML SOLN Take 3 mLs by nebulization every 4 (four) hours.  Marland Kitchen lisinopril-hydrochlorothiazide (PRINZIDE,ZESTORETIC) 10-12.5 MG tablet Take 1 tablet by mouth daily.  . metoprolol tartrate (LOPRESSOR) 25 MG tablet Take 1 tablet (25 mg total) by mouth 2 (two) times daily.  . Multiple Vitamin (MULTIVITAMIN WITH MINERALS) TABS tablet Take 1 tablet by mouth daily.  . potassium chloride (K-DUR) 10 MEQ tablet Take 1 tablet (10 mEq total) by mouth daily.  . predniSONE (DELTASONE) 20 MG tablet Take 20 mg by mouth daily as needed.  . tiotropium (SPIRIVA) 18 MCG inhalation capsule Place 18 mcg into inhaler and inhale daily.  . traMADol (ULTRAM) 50 MG tablet Take 1 tablet (50 mg total) by mouth every 6 (six) hours as needed for  moderate pain or severe pain.  . [DISCONTINUED] beclomethasone (QVAR) 40 MCG/ACT inhaler Inhale 1 puff into the lungs 2 (two) times daily.  Marland Kitchen acetaZOLAMIDE (DIAMOX) 250 MG tablet Take 1 tablet (250 mg total) by mouth 2 (two) times daily.  . [DISCONTINUED] glucose blood (ONE TOUCH ULTRA TEST) test strip Use 4 x daily as directed. e11.65  . [DISCONTINUED] metFORMIN (GLUCOPHAGE) 500 MG tablet Take 1 tablet (500 mg total) by mouth 2 (two) times daily with a meal.  . [DISCONTINUED] potassium chloride (K-DUR) 10 MEQ tablet Take 10 mEq by mouth daily.   No facility-administered encounter medications on file as of 04/08/2016.     Allergies (verified) Penicillins; Sulfa antibiotics; Codeine; and Hydrocodone   History: Past Medical History:  Diagnosis Date  . Allergy   . Anemia   . Anxiety   . Arthritis   . Asthma   . Cataract   . Chronic kidney disease   . COPD (chronic obstructive pulmonary disease) (HCC)    Chronic resp failure on home O2  . Depression   . Diabetes mellitus without complication (HCC)   . Emphysema of lung (HCC)   . GERD (gastroesophageal reflux disease)   . Hypercholesteremia 12/16/2015  . Hyperglycemia    Noted 09/2011 admission in setting of steroid use with normal HgbA1C.  Marland Kitchen Hypertension   . Hypokalemia   . Hyponatremia    Noted 09/2011 admission.  . NSTEMI (non-ST elevated myocardial infarction) (HCC)  a. 09/2011 in setting of acute on chronic resp failure/COPD exacerbation --> cath demonstrated nonobstructive CAD 10/05/11 with EF 50%;  08/2012 elevated Ti in setting of COPD flare -->Echo: EF 60-65%, Gr 1 DD -->Med Rx.  . Osteoporosis   . Oxygen deficiency   . QT prolongation    Noted on EKG 09/2011 (590 in setting of K of 3, improved to 475 by discharge)  . Tobacco abuse 09/19/2012  . Trigeminal neuralgia    Past Surgical History:  Procedure Laterality Date  . ABDOMINAL HYSTERECTOMY     bleeding  . CHOLECYSTECTOMY    . LEFT HEART CATHETERIZATION WITH  CORONARY ANGIOGRAM N/A 10/05/2011   Procedure: LEFT HEART CATHETERIZATION WITH CORONARY ANGIOGRAM;  Surgeon: Tonny Bollman, MD;  Location: [facility] CATH LAB;  Service: Cardiovascular;  Laterality: N/A;  . PLANTAR'S WART EXCISION     Family History  Problem Relation Age of Onset  . Cancer Father     Lung  . Cancer Mother     Liver  . Hypertension Son   . Hyperlipidemia Son   . Post-traumatic stress disorder Son   . Depression Son   . Kidney disease Brother     liver and kidney failure  . Hypertension Son   . Hyperlipidemia Son   . Cancer Other     colon  . Diabetes Son   . Lung disease Daughter     passed away at 92 months old   Social History   Occupational History  . retired    Social History Main Topics  . Smoking status: Former Smoker    Packs/day: 1.00    Years: 40.00    Types: Cigarettes    Start date: 01/26/1964    Quit date: 10/2014  . Smokeless tobacco: Never Used  . Alcohol use Yes     Comment: occasional  . Drug use: No  . Sexual activity: Not Currently    Tobacco Counseling Counseling given: Not Answered   Activities of Daily Living In your present state of health, do you have any difficulty performing the following activities: 04/08/2016 03/18/2016  Hearing? N N  Vision? Y N  Difficulty concentrating or making decisions? N N  Walking or climbing stairs? Y Y  Dressing or bathing? Y Y  Doing errands, shopping? Malvin Johns  Preparing Food and eating ? Y -  Using the Toilet? N -  In the past six months, have you accidently leaked urine? Y -  Do you have problems with loss of bowel control? Y -  Managing your Medications? Y -  Managing your Finances? Y -  Housekeeping or managing your Housekeeping? Y -  Some recent data might be hidden    Immunizations and Health Maintenance Immunization History  Administered Date(s) Administered  . Influenza Split 10/06/2011  . Pneumococcal Conjugate-13 11/11/2015  . Pneumococcal Polysaccharide-23 12/20/2015  . Tdap  11/11/2015   Health Maintenance Due  Topic Date Due  . Hepatitis C Screening  January 21, 1946  . FOOT EXAM  10/26/1954  . MAMMOGRAM  10/26/1994    Patient Care Team: [name], MD as PCP - General (Family Medicine)  Indicate any recent Medical Services you may have received from other than [facility] providers in the past year (date may be approximate).     Assessment:   This is a routine wellness examination for Sheila Pierce.  Hearing/Vision screen No exam data present  Dietary issues and exercise activities discussed: Current Exercise Habits: The patient does not participate in regular exercise at present,  Exercise limited by: respiratory conditions(s)  Goals    . Blood Pressure < 140/90    . Drink more water    . Stay out of the hospital      Depression Screen: Recommend follow up with Dr. Delton See [facility] 2/9 Scores 04/08/2016 03/18/2016 01/01/2016 12/16/2015 11/11/2015  PHQ - 2 Score 6 0 0 0 2  PHQ- 9 Score 26 - - - 5    Fall Risk Fall Risk  04/08/2016 03/18/2016 01/01/2016 12/16/2015 11/11/2015  Falls in the past year? No No No No No  Risk for fall due to : - - Impaired mobility - -    Cognitive Function: Normal by direct observation.  Screening Tests Health Maintenance  Topic Date Due  . Hepatitis C Screening  17-Jan-1946  . FOOT EXAM  10/26/1954  . MAMMOGRAM  10/26/1994  . OPHTHALMOLOGY EXAM  07/27/2016  . HEMOGLOBIN A1C  10/02/2016  . COLONOSCOPY  11/10/2025  . TETANUS/TDAP  11/10/2025  . INFLUENZA VACCINE  Completed  . DEXA SCAN  Completed  . PNA vac Low Risk Adult  Completed      Plan:  I have personally reviewed and addressed the Medicare Annual Wellness questionnaire and have noted the following in the patient's chart:  A. Medical and social history B. Use of alcohol, tobacco or illicit drugs  C. Current medications and supplements D. Functional ability and status E.  Nutritional status F.  Physical activity G. Advance directives H. List of other  physicians I.  Hospitalizations, surgeries, and ER visits in previous 12 months J.  Vitals K. Screenings to include cognitive, depression, and falls L. Referrals and appointments - Community resource referral sent today to assist patient with her medication budesonide, housing, a new wheelchair, as well as a new mattress for her bed.  In addition, I have reviewed and discussed with patient certain preventive protocols, quality metrics, and best practice recommendations. A written personalized care plan for preventive services as well as general preventive health recommendations were provided to patient.  Signed,   Candis Shine, LPN Lead Nurse Health Advisor

## 2016-04-08 NOTE — Progress Notes (Signed)
Subjective:    Patient ID: Sheila Pierce, female    DOB: Jun 09, 1945. Patient is being seen in f/u for management of diabetes requested by  [name], MD  Past Medical History:  Diagnosis Date  . Allergy   . Anemia   . Anxiety   . Arthritis   . Asthma   . Cataract   . Chronic kidney disease   . COPD (chronic obstructive pulmonary disease) (HCC)    Chronic resp failure on home O2  . Depression   . Diabetes mellitus without complication (HCC)   . Emphysema of lung (HCC)   . GERD (gastroesophageal reflux disease)   . Hypercholesteremia 12/16/2015  . Hyperglycemia    Noted 09/2011 admission in setting of steroid use with normal HgbA1C.  Marland Kitchen Hypertension   . Hypokalemia   . Hyponatremia    Noted 09/2011 admission.  . NSTEMI (non-ST elevated myocardial infarction) (HCC)    a. 09/2011 in setting of acute on chronic resp failure/COPD exacerbation --> cath demonstrated nonobstructive CAD 10/05/11 with EF 50%;  08/2012 elevated Ti in setting of COPD flare -->Echo: EF 60-65%, Gr 1 DD -->Med Rx.  . Osteoporosis   . Oxygen deficiency   . QT prolongation    Noted on EKG 09/2011 (590 in setting of K of 3, improved to 475 by discharge)  . Tobacco abuse 09/19/2012  . Trigeminal neuralgia    Past Surgical History:  Procedure Laterality Date  . ABDOMINAL HYSTERECTOMY     bleeding  . CHOLECYSTECTOMY    . LEFT HEART CATHETERIZATION WITH CORONARY ANGIOGRAM N/A 10/05/2011   Procedure: LEFT HEART CATHETERIZATION WITH CORONARY ANGIOGRAM;  Surgeon: Tonny Bollman, MD;  Location: [facility] CATH LAB;  Service: Cardiovascular;  Laterality: N/A;  . PLANTAR'S WART EXCISION     Social History   Social History  . Marital status: Divorced    Spouse name: N/A  . Number of children: 3  . Years of education: 6   Occupational History  . retired    Social History Main Topics  . Smoking status: Former Smoker    Packs/day: 1.00    Years: 40.00    Types: Cigarettes    Start date: 01/26/1964    Quit date:  10/2014  . Smokeless tobacco: Never Used     Comment: 1 pack every 3 days  . Alcohol use No  . Drug use: No  . Sexual activity: Not Currently   Other Topics Concern  . None   Social History Narrative   Lives with 2 adult sons   Outpatient Encounter Prescriptions as of 04/08/2016  Medication Sig  . acetaZOLAMIDE (DIAMOX) 250 MG tablet Take 1 tablet (250 mg total) by mouth 2 (two) times daily.  Marland Kitchen albuterol (VENTOLIN HFA) 108 (90 Base) MCG/ACT inhaler Inhale 2 puffs into the lungs every 6 (six) hours as needed for wheezing or shortness of breath.  Marland Kitchen aspirin 81 MG tablet Take 81 mg by mouth daily.  . beclomethasone (QVAR) 40 MCG/ACT inhaler Inhale 1 puff into the lungs 2 (two) times daily.  Marland Kitchen BREO ELLIPTA 100-25 MCG/INH AEPB Inhale 1 puff into the lungs daily.  . budesonide (PULMICORT) 0.25 MG/2ML nebulizer solution Take 2 mLs (0.25 mg total) by nebulization 2 (two) times daily.  . busPIRone (BUSPAR) 15 MG tablet TAKE 1 TABLET BY MOUTH TWICE DAILY  . Cholecalciferol 1000 units capsule Take 1,000 Units by mouth daily.   Marland Kitchen glucose blood (ONE TOUCH ULTRA TEST) test strip Use 4 x daily  as directed. e11.65  . ipratropium-albuterol (DUONEB) 0.5-2.5 (3) MG/3ML SOLN Take 3 mLs by nebulization every 4 (four) hours.  Marland Kitchen lisinopril-hydrochlorothiazide (PRINZIDE,ZESTORETIC) 10-12.5 MG tablet Take 1 tablet by mouth daily.  . metFORMIN (GLUCOPHAGE) 500 MG tablet Take 1 tablet (500 mg total) by mouth 2 (two) times daily with a meal.  . metoprolol tartrate (LOPRESSOR) 25 MG tablet Take 1 tablet (25 mg total) by mouth 2 (two) times daily.  . Multiple Vitamin (MULTIVITAMIN WITH MINERALS) TABS tablet Take 1 tablet by mouth daily.  . potassium chloride (K-DUR) 10 MEQ tablet Take 10 mEq by mouth daily.  . potassium chloride (K-DUR) 10 MEQ tablet Take 1 tablet (10 mEq total) by mouth daily.  Marland Kitchen tiotropium (SPIRIVA) 18 MCG inhalation capsule Place 18 mcg into inhaler and inhale daily.  . traMADol (ULTRAM) 50 MG  tablet Take 1 tablet (50 mg total) by mouth every 6 (six) hours as needed for moderate pain or severe pain.   No facility-administered encounter medications on file as of 04/08/2016.    ALLERGIES: Allergies  Allergen Reactions  . Penicillins Swelling  . Sulfa Antibiotics Hives    Hallucinations   . Codeine Nausea Only  . Hydrocodone Nausea Only   VACCINATION STATUS: Immunization History  Administered Date(s) Administered  . Influenza Split 10/06/2011  . Pneumococcal Conjugate-13 11/11/2015  . Pneumococcal Polysaccharide-23 12/20/2015  . Tdap 11/11/2015    Diabetes  She presents for her follow-up diabetic visit. She has type 2 diabetes mellitus. Onset time: She states she was diagnosed last year at age 40. Her disease course has been improving. There are no hypoglycemic associated symptoms. Pertinent negatives for hypoglycemia include no confusion, headaches, pallor or seizures. There are no diabetic associated symptoms. Pertinent negatives for diabetes include no chest pain and no polyuria. There are no hypoglycemic complications. Symptoms are improving. There are no diabetic complications. Risk factors for coronary artery disease include diabetes mellitus, dyslipidemia, obesity, hypertension, sedentary lifestyle and tobacco exposure. Current diabetic treatment includes insulin injections. Her weight is stable. She is following a generally unhealthy diet. When asked about meal planning, she reported none. She has not had a previous visit with a dietitian. An ACE inhibitor/angiotensin II receptor blocker is being taken.  Hyperlipidemia  This is a chronic problem. The current episode started more than 1 year ago. The problem is controlled. Pertinent negatives include no chest pain, myalgias or shortness of breath. She is currently on no antihyperlipidemic treatment. Risk factors for coronary artery disease include diabetes mellitus, dyslipidemia, hypertension, obesity and a sedentary  lifestyle.  Hypertension  This is a chronic problem. The current episode started more than 1 year ago. The problem is uncontrolled. Pertinent negatives include no chest pain, headaches, palpitations or shortness of breath. Risk factors for coronary artery disease include smoking/tobacco exposure, sedentary lifestyle, obesity, dyslipidemia and diabetes mellitus. Past treatments include ACE inhibitors and diuretics.       Review of Systems  Constitutional: Negative for chills, fever and unexpected weight change.  HENT: Negative for trouble swallowing and voice change.   Eyes: Negative for visual disturbance.  Respiratory: Negative for cough, shortness of breath and wheezing.   Cardiovascular: Negative for chest pain, palpitations and leg swelling.  Gastrointestinal: Negative for diarrhea, nausea and vomiting.  Endocrine: Negative for cold intolerance, heat intolerance and polyuria.  Musculoskeletal: Positive for back pain and gait problem. Negative for arthralgias and myalgias.       Patient is wheelchair-bound.  Skin: Negative for color change, pallor, rash and wound.  Neurological: Negative for seizures and headaches.  Psychiatric/Behavioral: Negative for confusion and suicidal ideas.    Objective:    BP 123/79   Pulse 80   Ht 5' (1.524 m)   Wt Readings from Last 3 Encounters:  03/18/16 139 lb 1.3 oz (63.1 kg)  01/07/16 140 lb 1.3 oz (63.5 kg)  12/19/15 138 lb 14.2 oz (63 kg)    Physical Exam  Constitutional: She is oriented to person, place, and time. She appears well-developed.  Agent is wheelchair-bound due to deconditioning and advancing COPD. She is also on oxygen per nasal cannula.  HENT:  Head: Normocephalic and atraumatic.  Eyes: EOM are normal.  Neck: Normal range of motion. Neck supple. No tracheal deviation present. No thyromegaly present.  Cardiovascular: Normal rate and regular rhythm.   Pulmonary/Chest: Effort normal. She has wheezes. She has rales.  Uses  oxygen per nasal cannula.  Abdominal: Soft. Bowel sounds are normal. There is no tenderness. There is no guarding.  Musculoskeletal: Normal range of motion. She exhibits no edema.  Neurological: She is alert and oriented to person, place, and time. She has normal reflexes. No cranial nerve deficit. Coordination normal.  Skin: Skin is warm and dry. No rash noted. No erythema. No pallor.  Psychiatric: She has a normal mood and affect. Judgment normal.     CMP     Component Value Date/Time   NA 136 04/01/2016 0943   K 3.8 04/01/2016 0943   CL 94 (L) 04/01/2016 0943   CO2 30 (H) 04/01/2016 0943   GLUCOSE 103 (H) 04/01/2016 0943   GLUCOSE 129 (H) 12/20/2015 0610   BUN 23 04/01/2016 0943   CREATININE 1.25 (H) 04/01/2016 0943   CALCIUM 9.6 04/01/2016 0943   PROT 6.4 04/01/2016 0943   ALBUMIN 4.0 04/01/2016 0943   AST 13 04/01/2016 0943   ALT 13 04/01/2016 0943   ALKPHOS 68 04/01/2016 0943   BILITOT 0.2 04/01/2016 0943   GFRNONAA 43 (L) 04/01/2016 0943   GFRAA 50 (L) 04/01/2016 0943     Diabetic Labs (most recent): Lab Results  Component Value Date   HGBA1C 5.1 04/01/2016   HGBA1C 5.3 11/05/2015   HGBA1C 5.6 10/05/2011     Lipid Panel ( most recent) Lipid Panel     Component Value Date/Time   CHOL 200 (H) 04/01/2016 0943   TRIG 135 04/01/2016 0943   HDL 55 04/01/2016 0943   CHOLHDL 2.9 10/05/2011 0143   VLDL 15 10/05/2011 0143   LDLCALC 118 (H) 04/01/2016 0943      Assessment & Plan:   1. Diabetes mellitus, stable (HCC) - Patient has  recently diagnosed asymptomatic  type 2 DM .  her most recent A1c of 5.1 %-  Consistent  with tight control.  - Reviewing her prior records, she never had significantly elevated glycemic profile nor A1c more than 6.5%. Recent labs reviewed. -  Her prednisone now is as needed instead of regular use. - She is at risk of loss of control of diabetes 2 diabetes, as well as adrenal insufficiency if it is reinitiated for regular use or   stopped suddenly.  - Patient has several severe comorbidities including advanced COPD on oxygen supplement however she does not report direct gross diabetes complications. -  Nevertheless, Mrs. Mcquary  remains at a high risk for more acute and chronic complications of diabetes which include CAD, CVA, CKD, retinopathy, and neuropathy. These are all discussed in detail with the patient.  - I have counseled the patient  on diet management and weight loss, by adopting a carbohydrate restricted/protein rich diet.  - Suggestion is made for patient to avoid simple carbohydrates   from their diet including Cakes , Desserts, Ice Cream,  Soda (  diet and regular) , Sweet Tea , Candies,  Chips, Cookies, Artificial Sweeteners,   and "Sugar-free" Products . This will help patient to have stable blood glucose profile and potentially avoid unintended weight gain.  - I encouraged the patient to switch to  unprocessed or minimally processed complex starch and increased protein intake (animal or plant source), fruits, and vegetables.  - Patient is advised to stick to a routine mealtimes to eat 3 meals  a day and avoid unnecessary snacks ( to snack only to correct hypoglycemia).  - The patient will be scheduled with Norm Salt, RDN, CDE for individualized DM education.  - I have approached patient with the following individualized plan to manage diabetes and patient agrees:   -  given her tight control of glycemia with A1c of 5.1%, she will be considered for discontinuation of metformin at this time.   -Patient is encouraged to call clinic for blood glucose levels less than 70 or above 300 mg /dl.  - Patient specific target  A1c;  LDL, HDL, Triglycerides, and  Waist Circumference were discussed in detail.  2) BP/HTN:  uncontrolled .Continue current medications including ACEI/ARB. 3) Lipids/HPL:   LDL slightly above target at 118. She will not be initiated on statin therapy.  4)  Weight/Diet: she is limited  on her exercise abilities, detailed carbohydrates information provided.  5) Chronic Care/Health Maintenance:  -Patient is on ACEI/ARB  medications and encouraged to continue to follow up with Ophthalmology, Podiatrist at least yearly or according to recommendations, and advised to  stay away from smoking ( she has 60+ pack year history of smoking). I have recommended yearly flu vaccine and pneumonia vaccination at least every 5 years;  and  sleep for at least 7 hours a day.  - 30 minutes of time was spent on the care of this patient , 50% of which was applied for counseling on diabetes complications and their preventions.  - Patient to bring meter and  blood glucose logs during their next visit.   - I advised patient to maintain close follow up with [name], MD for primary care needs.  Follow up plan: - Return in about 6 months (around 10/09/2016) for follow up with pre-visit labs.  Marquis Lunch, MD Phone: [phone]  Fax: [phone]   04/08/2016, 2:51 PM

## 2016-04-09 DIAGNOSIS — J440 Chronic obstructive pulmonary disease with acute lower respiratory infection: Secondary | ICD-10-CM | POA: Diagnosis not present

## 2016-04-10 DIAGNOSIS — J449 Chronic obstructive pulmonary disease, unspecified: Secondary | ICD-10-CM | POA: Diagnosis not present

## 2016-04-10 DIAGNOSIS — J440 Chronic obstructive pulmonary disease with acute lower respiratory infection: Secondary | ICD-10-CM | POA: Diagnosis not present

## 2016-04-10 DIAGNOSIS — J9610 Chronic respiratory failure, unspecified whether with hypoxia or hypercapnia: Secondary | ICD-10-CM | POA: Diagnosis not present

## 2016-04-11 DIAGNOSIS — J440 Chronic obstructive pulmonary disease with acute lower respiratory infection: Secondary | ICD-10-CM | POA: Diagnosis not present

## 2016-04-12 DIAGNOSIS — J440 Chronic obstructive pulmonary disease with acute lower respiratory infection: Secondary | ICD-10-CM | POA: Diagnosis not present

## 2016-04-13 DIAGNOSIS — J440 Chronic obstructive pulmonary disease with acute lower respiratory infection: Secondary | ICD-10-CM | POA: Diagnosis not present

## 2016-04-14 DIAGNOSIS — J440 Chronic obstructive pulmonary disease with acute lower respiratory infection: Secondary | ICD-10-CM | POA: Diagnosis not present

## 2016-04-15 DIAGNOSIS — J440 Chronic obstructive pulmonary disease with acute lower respiratory infection: Secondary | ICD-10-CM | POA: Diagnosis not present

## 2016-04-16 ENCOUNTER — Emergency Department (HOSPITAL_COMMUNITY): Admission: EM | Admit: 2016-04-16 | Discharge: 2016-04-16 | Discharge status: Absent without leave | Payer: Medicare HMO

## 2016-04-16 ENCOUNTER — Telehealth: Payer: Self-pay | Admitting: Family Medicine

## 2016-04-16 ENCOUNTER — Emergency Department (HOSPITAL_COMMUNITY)
Admission: EM | Admit: 2016-04-16 | Discharge: 2016-04-16 | Disposition: A | Discharge status: Home / Self Care | Payer: Medicare HMO | Attending: Emergency Medicine | Admitting: Emergency Medicine

## 2016-04-16 ENCOUNTER — Emergency Department (HOSPITAL_COMMUNITY): Payer: Medicare HMO

## 2016-04-16 ENCOUNTER — Encounter (HOSPITAL_COMMUNITY): Payer: Self-pay | Admitting: Emergency Medicine

## 2016-04-16 DIAGNOSIS — R10813 Right lower quadrant abdominal tenderness: Secondary | ICD-10-CM | POA: Diagnosis not present

## 2016-04-16 DIAGNOSIS — Z79899 Other long term (current) drug therapy: Secondary | ICD-10-CM | POA: Insufficient documentation

## 2016-04-16 DIAGNOSIS — E1122 Type 2 diabetes mellitus with diabetic chronic kidney disease: Secondary | ICD-10-CM | POA: Insufficient documentation

## 2016-04-16 DIAGNOSIS — R52 Pain, unspecified: Secondary | ICD-10-CM

## 2016-04-16 DIAGNOSIS — N3001 Acute cystitis with hematuria: Secondary | ICD-10-CM | POA: Insufficient documentation

## 2016-04-16 DIAGNOSIS — R1111 Vomiting without nausea: Secondary | ICD-10-CM | POA: Diagnosis not present

## 2016-04-16 DIAGNOSIS — R000 Tachycardia, unspecified: Secondary | ICD-10-CM | POA: Diagnosis not present

## 2016-04-16 DIAGNOSIS — N189 Chronic kidney disease, unspecified: Secondary | ICD-10-CM | POA: Insufficient documentation

## 2016-04-16 DIAGNOSIS — R112 Nausea with vomiting, unspecified: Secondary | ICD-10-CM | POA: Diagnosis not present

## 2016-04-16 DIAGNOSIS — J440 Chronic obstructive pulmonary disease with acute lower respiratory infection: Secondary | ICD-10-CM | POA: Diagnosis not present

## 2016-04-16 DIAGNOSIS — J45909 Unspecified asthma, uncomplicated: Secondary | ICD-10-CM | POA: Diagnosis not present

## 2016-04-16 DIAGNOSIS — I129 Hypertensive chronic kidney disease with stage 1 through stage 4 chronic kidney disease, or unspecified chronic kidney disease: Secondary | ICD-10-CM | POA: Insufficient documentation

## 2016-04-16 DIAGNOSIS — J449 Chronic obstructive pulmonary disease, unspecified: Secondary | ICD-10-CM | POA: Diagnosis not present

## 2016-04-16 DIAGNOSIS — R109 Unspecified abdominal pain: Secondary | ICD-10-CM | POA: Diagnosis not present

## 2016-04-16 DIAGNOSIS — Z87891 Personal history of nicotine dependence: Secondary | ICD-10-CM | POA: Insufficient documentation

## 2016-04-16 DIAGNOSIS — B9629 Other Escherichia coli [E. coli] as the cause of diseases classified elsewhere: Secondary | ICD-10-CM | POA: Diagnosis not present

## 2016-04-16 DIAGNOSIS — K381 Appendicular concretions: Secondary | ICD-10-CM | POA: Diagnosis not present

## 2016-04-16 LAB — CBC WITH DIFFERENTIAL/PLATELET
BASOS ABS: 0 10*3/uL (ref 0.0–0.1)
BASOS PCT: 0 %
Eosinophils Absolute: 0.3 10*3/uL (ref 0.0–0.7)
Eosinophils Relative: 4 %
HEMATOCRIT: 34.9 % — AB (ref 36.0–46.0)
HEMOGLOBIN: 10.7 g/dL — AB (ref 12.0–15.0)
Lymphocytes Relative: 15 %
Lymphs Abs: 0.9 10*3/uL (ref 0.7–4.0)
MCH: 28.2 pg (ref 26.0–34.0)
MCHC: 30.7 g/dL (ref 30.0–36.0)
MCV: 91.8 fL (ref 78.0–100.0)
MONO ABS: 0.6 10*3/uL (ref 0.1–1.0)
Monocytes Relative: 9 %
NEUTROS ABS: 4.4 10*3/uL (ref 1.7–7.7)
NEUTROS PCT: 72 %
Platelets: 294 10*3/uL (ref 150–400)
RBC: 3.8 MIL/uL — ABNORMAL LOW (ref 3.87–5.11)
RDW: 14.1 % (ref 11.5–15.5)
WBC: 6.2 10*3/uL (ref 4.0–10.5)

## 2016-04-16 LAB — COMPREHENSIVE METABOLIC PANEL
ALBUMIN: 4 g/dL (ref 3.5–5.0)
ALT: 19 U/L (ref 14–54)
AST: 20 U/L (ref 15–41)
Alkaline Phosphatase: 64 U/L (ref 38–126)
Anion gap: 8 (ref 5–15)
BILIRUBIN TOTAL: 0.3 mg/dL (ref 0.3–1.2)
BUN: 18 mg/dL (ref 6–20)
CO2: 31 mmol/L (ref 22–32)
Calcium: 9.9 mg/dL (ref 8.9–10.3)
Chloride: 96 mmol/L — ABNORMAL LOW (ref 101–111)
Creatinine, Ser: 1.21 mg/dL — ABNORMAL HIGH (ref 0.44–1.00)
GFR calc Af Amer: 51 mL/min — ABNORMAL LOW (ref >60)
GFR calc non Af Amer: 44 mL/min — ABNORMAL LOW (ref >60)
GLUCOSE: 142 mg/dL — AB (ref 65–99)
POTASSIUM: 3.6 mmol/L (ref 3.5–5.1)
Sodium: 135 mmol/L (ref 135–145)
TOTAL PROTEIN: 7.7 g/dL (ref 6.5–8.1)

## 2016-04-16 LAB — URINALYSIS, ROUTINE W REFLEX MICROSCOPIC
Bilirubin Urine: NEGATIVE
Glucose, UA: NEGATIVE mg/dL
KETONES UR: NEGATIVE mg/dL
Nitrite: POSITIVE — AB
PH: 6 (ref 5.0–8.0)
PROTEIN: NEGATIVE mg/dL
Specific Gravity, Urine: 1.006 (ref 1.005–1.030)

## 2016-04-16 LAB — LIPASE, BLOOD: Lipase: 26 U/L (ref 11–51)

## 2016-04-16 MED ORDER — PROCHLORPERAZINE EDISYLATE 5 MG/ML IJ SOLN
10.0000 mg | Freq: Once | INTRAMUSCULAR | Status: AC
  Administered 2016-04-16: 10 mg via INTRAVENOUS
  Filled 2016-04-16: qty 2

## 2016-04-16 MED ORDER — NITROFURANTOIN MONOHYD MACRO 100 MG PO CAPS: 100.0000 mg | ORAL_CAPSULE | Freq: Two times a day (BID) | ORAL | 0 refills | Status: DC

## 2016-04-16 MED ORDER — NITROFURANTOIN MONOHYD MACRO 100 MG PO CAPS
100.0000 mg | ORAL_CAPSULE | Freq: Once | ORAL | Status: AC
  Administered 2016-04-16: 100 mg via ORAL
  Filled 2016-04-16: qty 1

## 2016-04-16 MED ORDER — SODIUM CHLORIDE 0.9 % IV BOLUS (SEPSIS)
1000.0000 mL | Freq: Once | INTRAVENOUS | Status: AC
  Administered 2016-04-16: 1000 mL via INTRAVENOUS

## 2016-04-16 MED ORDER — ONDANSETRON HCL 4 MG/2ML IJ SOLN
4.0000 mg | Freq: Once | INTRAMUSCULAR | Status: AC
  Administered 2016-04-16: 4 mg via INTRAVENOUS
  Filled 2016-04-16: qty 2

## 2016-04-16 MED ORDER — PROCHLORPERAZINE MALEATE 10 MG PO TABS: 10.0000 mg | ORAL_TABLET | Freq: Two times a day (BID) | ORAL | 0 refills | Status: DC | PRN

## 2016-04-16 NOTE — ED Triage Notes (Signed)
Sent here for dehydration per bloodwork by pcp.  Has been vomiting x 2-3 days

## 2016-04-16 NOTE — Telephone Encounter (Signed)
Spoke to son, stastes shes been throwing up for 2 weeks, cant keep anything down, no fever, no diarrhea, and actually hasnt had a BM in ~ 4 days. Is going to call ambulance to have her taken to er for eval.

## 2016-04-16 NOTE — Telephone Encounter (Signed)
Sheila Pierce please call.  Is she keeping down water/  Medication.  Is the vomiting associated with cough?  Does she have diarrhea?  Fever?  Call in zofran to take 1-2 times a day, #20.  Clear liquid diet, advance to bland diet.  Call if not better by Monday.  GO TO ER if dehydrated.

## 2016-04-16 NOTE — Telephone Encounter (Signed)
Sheila Pierce is calling on behalf of his mother Sheila Pierce, he states that she has been throwing up a lot and can't keep anything down, please advise?

## 2016-04-16 NOTE — ED Notes (Signed)
Patient transported to CT 

## 2016-04-16 NOTE — Discharge Instructions (Signed)
Drink plenty of fluids follow-up your doctor next week. Return sooner if you are unable to keep her medicine down

## 2016-04-16 NOTE — ED Notes (Signed)
Pt has had no vomiting in ED

## 2016-04-16 NOTE — ED Notes (Signed)
Took pt bedside potty chair

## 2016-04-16 NOTE — ED Notes (Signed)
Received report on pt, pt reports that she is ready to go home, urine sample still pending, explained to pt, pt assisted to bedside commode, urine sample obtained, sent to lab, update given,

## 2016-04-16 NOTE — ED Provider Notes (Signed)
[facility]-EM[facility]Y DEPT Provider Note   CSN: [id number] Arrival date & time: 04/16/16  1452     History   Chief Complaint Chief Complaint  Patient presents with  . Dehydration    HPI Sheila Pierce is a 71 y.o. female.  Patient complains of some vomiting today. No fevers no chills   The history is provided by the patient. No language interpreter was used.  Emesis   This is a new problem. The current episode started 12 to 24 hours ago. The problem occurs 2 to 4 times per day. The problem has been resolved. The emesis has an appearance of stomach contents. Pertinent negatives include no abdominal pain, no cough, no diarrhea and no headaches.    Past Medical History:  Diagnosis Date  . Allergy   . Anemia   . Anxiety   . Arthritis   . Asthma   . Cataract   . Chronic kidney disease   . COPD (chronic obstructive pulmonary disease) (HCC)    Chronic resp failure on home O2  . Depression   . Diabetes mellitus without complication (HCC)   . Emphysema of lung (HCC)   . GERD (gastroesophageal reflux disease)   . Hypercholesteremia 12/16/2015  . Hyperglycemia    Noted 09/2011 admission in setting of steroid use with normal HgbA1C.  Marland Kitchen Hypertension   . Hypokalemia   . Hyponatremia    Noted 09/2011 admission.  . NSTEMI (non-ST elevated myocardial infarction) (HCC)    a. 09/2011 in setting of acute on chronic resp failure/COPD exacerbation --> cath demonstrated nonobstructive CAD 10/05/11 with EF 50%;  08/2012 elevated Ti in setting of COPD flare -->Echo: EF 60-65%, Gr 1 DD -->Med Rx.  . Osteoporosis   . Oxygen deficiency   . QT prolongation    Noted on EKG 09/2011 (590 in setting of K of 3, improved to 475 by discharge)  . Tobacco abuse 09/19/2012  . Trigeminal neuralgia     Patient Active Problem List   Diagnosis Date Noted  . COPD (chronic obstructive pulmonary disease) (HCC) 12/19/2015  . Acute on chronic respiratory failure with hypoxia (HCC) 12/19/2015  . Hypercholesteremia  12/16/2015  . Osteoporosis 12/01/2015  . Colonoscopy refused 12/01/2015  . Macular degeneration, age related, nonexudative 11/26/2015  . Cataract incipient, senile, bilateral 11/26/2015  . Onychomycosis of toenail 11/11/2015  . Aortic atherosclerosis (HCC) 11/11/2015  . Abnormal thyroid function test 11/06/2015  . Diabetes mellitus, stable (HCC) 11/05/2015  . COPD exacerbation (HCC) 11/04/2015  . AKI (acute kidney injury) (HCC) 11/03/2015  . Normocytic anemia 11/03/2015  . Anxiety 11/03/2015  . Acute exacerbation of chronic obstructive pulmonary disease (COPD) (HCC) 09/19/2012  . CAD (coronary artery disease) 11/24/2011  . Essential hypertension   . QT prolongation   . MI, acute, non ST segment elevation (HCC) 10/05/2011    Past Surgical History:  Procedure Laterality Date  . ABDOMINAL HYSTERECTOMY     bleeding  . CHOLECYSTECTOMY    . LEFT HEART CATHETERIZATION WITH CORONARY ANGIOGRAM N/A 10/05/2011   Procedure: LEFT HEART CATHETERIZATION WITH CORONARY ANGIOGRAM;  Surgeon: Tonny Bollman, MD;  Location: [facility] CATH LAB;  Service: Cardiovascular;  Laterality: N/A;  . PLANTAR'S WART EXCISION      OB History    No data available       Home Medications    Prior to Admission medications   Medication Sig Start Date End Date Taking? Authorizing Provider  acetaZOLAMIDE (DIAMOX) 250 MG tablet Take 250 mg by mouth 2 (two) times daily.  Yes Historical Provider, MD  albuterol (VENTOLIN HFA) 108 (90 Base) MCG/ACT inhaler Inhale 2 puffs into the lungs every 6 (six) hours as needed for wheezing or shortness of breath. 03/09/16  Yes [name], MD  aspirin EC 81 MG tablet Take 81 mg by mouth daily.   Yes Historical Provider, MD  BREO ELLIPTA 100-25 MCG/INH AEPB Inhale 1 puff into the lungs daily. 11/11/15  Yes [name], MD  budesonide (PULMICORT) 0.5 MG/2ML nebulizer solution Take 0.5 mg by nebulization 2 (two) times daily.   Yes Historical Provider, MD  busPIRone (BUSPAR) 15  MG tablet TAKE 1 TABLET BY MOUTH TWICE DAILY 02/23/16  Yes [name], MD  cholecalciferol (VITAMIN D) 1000 units tablet Take 1,000 Units by mouth daily.   Yes Historical Provider, MD  ipratropium-albuterol (DUONEB) 0.5-2.5 (3) MG/3ML SOLN Take 3 mLs by nebulization every 4 (four) hours. 11/11/15  Yes [name], MD  lisinopril-hydrochlorothiazide (PRINZIDE,ZESTORETIC) 10-12.5 MG tablet Take 1 tablet by mouth daily. 11/11/15  Yes [name], MD  metoprolol tartrate (LOPRESSOR) 25 MG tablet Take 1 tablet (25 mg total) by mouth 2 (two) times daily. 11/11/15  Yes [name], MD  Multiple Vitamin (MULTIVITAMIN WITH MINERALS) TABS tablet Take 1 tablet by mouth daily.   Yes Historical Provider, MD  potassium chloride (K-DUR) 10 MEQ tablet Take 1 tablet (10 mEq total) by mouth daily. 03/25/16  Yes [name], MD  predniSONE (DELTASONE) 20 MG tablet Take 20 mg by mouth daily as needed (for breathing problems).    Yes Historical Provider, MD  tiotropium (SPIRIVA) 18 MCG inhalation capsule Place 18 mcg into inhaler and inhale daily.   Yes Historical Provider, MD  traMADol (ULTRAM) 50 MG tablet Take 1 tablet (50 mg total) by mouth every 6 (six) hours as needed for moderate pain or severe pain. 11/11/15  Yes [name], MD  nitrofurantoin, macrocrystal-monohydrate, (MACROBID) 100 MG capsule Take 1 capsule (100 mg total) by mouth 2 (two) times daily. X 7 days 04/16/16   Bethann Berkshire, MD  prochlorperazine (COMPAZINE) 10 MG tablet Take 1 tablet (10 mg total) by mouth 2 (two) times daily as needed for nausea or vomiting. 04/16/16   Bethann Berkshire, MD    Family History Family History  Problem Relation Age of Onset  . Cancer Father     Lung  . Cancer Mother     Liver  . Hypertension Son   . Hyperlipidemia Son   . Post-traumatic stress disorder Son   . Depression Son   . Kidney disease Brother     liver and kidney failure  . Hypertension Son   . Hyperlipidemia Son   .  Cancer Other     colon  . Diabetes Son   . Lung disease Daughter     passed away at 38 months old    Social History Social History  Substance Use Topics  . Smoking status: Former Smoker    Packs/day: 1.00    Years: 40.00    Types: Cigarettes    Start date: 01/26/1964    Quit date: 10/2014  . Smokeless tobacco: Never Used  . Alcohol use Yes     Comment: occasional     Allergies   Penicillins; Sulfa antibiotics; Codeine; and Hydrocodone   Review of Systems Review of Systems  Constitutional: Negative for appetite change and fatigue.  HENT: Negative for congestion, ear discharge and sinus pressure.   Eyes: Negative for discharge.  Respiratory: Negative  for cough.   Cardiovascular: Negative for chest pain.  Gastrointestinal: Positive for vomiting. Negative for abdominal pain and diarrhea.  Genitourinary: Negative for frequency and hematuria.  Musculoskeletal: Negative for back pain.  Skin: Negative for rash.  Neurological: Negative for seizures and headaches.  Psychiatric/Behavioral: Negative for hallucinations.     Physical Exam Updated Vital Signs BP (!) 129/52   Pulse (!) 117   Temp 97.8 F (36.6 C) (Oral)   Resp (!) 24   Ht 5' (1.524 m)   Wt 135 lb (61.2 kg)   SpO2 99%   BMI 26.37 kg/m   Physical Exam  Constitutional: She is oriented to person, place, and time. She appears well-developed.  HENT:  Head: Normocephalic.  Eyes: Conjunctivae and EOM are normal. No scleral icterus.  Neck: Neck supple. No thyromegaly present.  Cardiovascular: Normal rate and regular rhythm.  Exam reveals no gallop and no friction rub.   No murmur heard. Pulmonary/Chest: No stridor. She has no wheezes. She has no rales. She exhibits no tenderness.  Abdominal: She exhibits no distension. There is no tenderness. There is no rebound.  Musculoskeletal: Normal range of motion. She exhibits no edema.  Lymphadenopathy:    She has no cervical adenopathy.  Neurological: She is oriented  to person, place, and time. She exhibits normal muscle tone. Coordination normal.  Skin: No rash noted. No erythema.  Psychiatric: She has a normal mood and affect. Her behavior is normal.     ED Treatments / Results  Labs (all labs ordered are listed, but only abnormal results are displayed) Labs Reviewed  CBC WITH DIFFERENTIAL/PLATELET - Abnormal; Notable for the following:       Result Value   RBC 3.80 (*)    Hemoglobin 10.7 (*)    HCT 34.9 (*)    All other components within normal limits  COMPREHENSIVE METABOLIC PANEL - Abnormal; Notable for the following:    Chloride 96 (*)    Glucose, Bld 142 (*)    Creatinine, Ser 1.21 (*)    GFR calc non Af Amer 44 (*)    GFR calc Af Amer 51 (*)    All other components within normal limits  URINALYSIS, ROUTINE W REFLEX MICROSCOPIC - Abnormal; Notable for the following:    APPearance HAZY (*)    Hgb urine dipstick SMALL (*)    Nitrite POSITIVE (*)    Leukocytes, UA LARGE (*)    Bacteria, UA MANY (*)    Squamous Epithelial / LPF 0-5 (*)    All other components within normal limits  URINE CULTURE  LIPASE, BLOOD    EKG  EKG Interpretation None       Radiology Ct Renal Stone Study  Result Date: 04/16/2016 CLINICAL DATA:  Vomiting for 2-3 days EXAM: CT ABDOMEN AND PELVIS WITHOUT CONTRAST TECHNIQUE: Multidetector CT imaging of the abdomen and pelvis was performed following the standard protocol without IV contrast. COMPARISON:  07/10/2011 FINDINGS: Lower chest: Moderate emphysematous disease at the lung bases, progressed since prior CT. Patchy dependent atelectasis right base. Subpleural 4 mm right middle lobe pulmonary nodule. Normal heart size. Hepatobiliary: No focal hepatic abnormality. Surgical clips in the gallbladder fossa. Prominent extrahepatic common bile duct, similar compared to prior. Pancreas: Unremarkable. No pancreatic ductal dilatation or surrounding inflammatory changes. Spleen: Normal in size without focal abnormality.  Adrenals/Urinary Tract: The adrenal glands are within normal limits. The kidneys show no hydronephrosis. There is a punctate nonobstructing stone in the lower pole of the right kidney on the  coronal views. Hyperdense exophytic nodule, superior pole left kidney measuring 14 mm, slightly increased and more dense compared to prior CT. No ureteral stones. The bladder is nearly empty Stomach/Bowel: The stomach is nonenlarged. There is no dilated small bowel. Moderate stool in the colon. Small calcifications in the appendix but no inflammation. Numerous sigmoid colon diverticula but no acute inflammatory change. Vascular/Lymphatic: Atherosclerosis. Non aneurysmal aorta. No grossly enlarged lymph nodes Reproductive: Uterus and bilateral adnexa are unremarkable. Other: There is no free air or free fluid. Musculoskeletal: Suspect transitional anatomy. The first non rib-bearing lumbar vertebra will be L1. Osteopenia. Multiple mild to moderate compression fracture deformities at L1, L2, L4, L5 and S1, progressed since prior study. IMPRESSION: 1. Punctate nonobstructing stone in the lower pole of the right kidney. No hydronephrosis or ureteral stones. 2. Moderate emphysematous disease. 4 mm right middle lobe pulmonary nodule. No follow-up needed if patient is low-risk. Non-contrast chest CT can be considered in 12 months if patient is high-risk. This recommendation follows the consensus statement: Guidelines for Management of Incidental Pulmonary Nodules Detected on CT Images: From the Fleischner Society 2017; Radiology 2017; 284:228-243. 3. 14 mm hyperdense nodule upper pole left kidney may represent a hemorrhagic or proteinaceous cyst, correlation with nonemergent ultrasound could be considered for further evaluation 4. Small stones in the appendix but no acute inflammation 5. No evidence for a bowel obstruction 6. Sigmoid colon diverticula without acute inflammation 7. Suspect transitional anatomy of the lumbar spine.  Multiple compression deformities, progressed since prior CT. Osteopenia. Electronically Signed   By: Jasmine Pang M.D.   On: 04/16/2016 18:07    Procedures Procedures (including critical care time)  Medications Ordered in ED Medications  nitrofurantoin (macrocrystal-monohydrate) (MACROBID) capsule 100 mg (not administered)  sodium chloride 0.9 % bolus 1,000 mL (0 mLs Intravenous Stopped 04/16/16 1926)  ondansetron (ZOFRAN) injection 4 mg (4 mg Intravenous Given 04/16/16 1828)  prochlorperazine (COMPAZINE) injection 10 mg (10 mg Intravenous Given 04/16/16 1829)     Initial Impression / Assessment and Plan / ED Course  I have reviewed the triage vital signs and the nursing notes.  Pertinent labs & imaging results that were available during my care of the patient were reviewed by me and considered in my medical decision making (see chart for details).     Blood work and CT of abdomen unremarkable. Urine shows urinary tract infection. Patient's urine will be cultured she will be put on Macrobid and given Compazine for nausea and is to follow-up with her PCP next week  Final Clinical Impressions(s) / ED Diagnoses   Final diagnoses:  Pain  Acute cystitis with hematuria    New Prescriptions New Prescriptions   NITROFURANTOIN, MACROCRYSTAL-MONOHYDRATE, (MACROBID) 100 MG CAPSULE    Take 1 capsule (100 mg total) by mouth 2 (two) times daily. X 7 days   PROCHLORPERAZINE (COMPAZINE) 10 MG TABLET    Take 1 tablet (10 mg total) by mouth 2 (two) times daily as needed for nausea or vomiting.     Bethann Berkshire, MD 04/16/16 2023

## 2016-04-17 DIAGNOSIS — J440 Chronic obstructive pulmonary disease with acute lower respiratory infection: Secondary | ICD-10-CM | POA: Diagnosis not present

## 2016-04-19 DIAGNOSIS — J440 Chronic obstructive pulmonary disease with acute lower respiratory infection: Secondary | ICD-10-CM | POA: Diagnosis not present

## 2016-04-19 LAB — URINE CULTURE

## 2016-04-20 ENCOUNTER — Telehealth: Payer: Self-pay | Admitting: *Deleted

## 2016-04-20 ENCOUNTER — Other Ambulatory Visit: Payer: Self-pay | Admitting: Family Medicine

## 2016-04-20 DIAGNOSIS — J440 Chronic obstructive pulmonary disease with acute lower respiratory infection: Secondary | ICD-10-CM | POA: Diagnosis not present

## 2016-04-20 NOTE — Telephone Encounter (Signed)
Post ED Visit - Positive Culture Follow-up  Culture report reviewed by antimicrobial stewardship pharmacist:  []  Enzo Bi, Pharm.D. []  Celedonio Miyamoto, Pharm.D., BCPS AQ-ID []  Garvin Fila, Pharm.D., BCPS [x]  Georgina Pillion, [address].D., BCPS []  [place], [address].D., BCPS, AAHIVP []  Estella Husk, Pharm.D., BCPS, AAHIVP []  Lysle Pearl, PharmD, BCPS []  Casilda Carls, PharmD, BCPS []  Pollyann Samples, PharmD, BCPS  Positive urine culture Treated with Nitrofurantoin Monohyd Macro, organism sensitive to the same and no further patient follow-up is required at this time.  Virl Axe [facility] 04/20/2016, 12:30 PM

## 2016-04-20 NOTE — Telephone Encounter (Signed)
Seen 2 22 18 

## 2016-04-21 DIAGNOSIS — J440 Chronic obstructive pulmonary disease with acute lower respiratory infection: Secondary | ICD-10-CM | POA: Diagnosis not present

## 2016-04-22 ENCOUNTER — Ambulatory Visit (INDEPENDENT_AMBULATORY_CARE_PROVIDER_SITE_OTHER): Payer: Medicare HMO | Admitting: Family Medicine

## 2016-04-22 ENCOUNTER — Encounter: Payer: Self-pay | Admitting: Family Medicine

## 2016-04-22 VITALS — BP 136/64 | HR 98 | Temp 98.4°F | Resp 24 | Ht 60.0 in | Wt 142.0 lb

## 2016-04-22 DIAGNOSIS — J441 Chronic obstructive pulmonary disease with (acute) exacerbation: Secondary | ICD-10-CM

## 2016-04-22 DIAGNOSIS — N3001 Acute cystitis with hematuria: Secondary | ICD-10-CM

## 2016-04-22 DIAGNOSIS — J440 Chronic obstructive pulmonary disease with acute lower respiratory infection: Secondary | ICD-10-CM | POA: Diagnosis not present

## 2016-04-22 MED ORDER — ALBUTEROL SULFATE HFA 108 (90 BASE) MCG/ACT IN AERS: 2.0000 | INHALATION_SPRAY | Freq: Four times a day (QID) | RESPIRATORY_TRACT | 2 refills | Status: DC | PRN

## 2016-04-22 NOTE — Patient Instructions (Addendum)
Continue to drink lots of water Pro-air is refilled Take antibiotic until all gone See me in 2 months Please re schedule the April visit

## 2016-04-22 NOTE — Progress Notes (Signed)
Chief Complaint  Patient presents with  . Follow-up    [facility] ER   We got a call that [name] had been vomiting every day for weeks and that she had no BM for 4 days and son thought she was dehydrated.  She was advised to go to the ER. ER notes and all results reviewed At the ER Sheila Pierce told the physician that she had been vomiting a day or two.  Workup indicated a UTI with positive culture, treated with macrodantin.  She is still taking.  No side effects.  Feels back to her baseline with no more stomach upset. Her COPD is at baseline Appetite is OK, no weight loss. No fever or sign resp infection   Patient Active Problem List   Diagnosis Date Noted  . COPD (chronic obstructive pulmonary disease) (HCC) 12/19/2015  . Acute on chronic respiratory failure with hypoxia (HCC) 12/19/2015  . Hypercholesteremia 12/16/2015  . Osteoporosis 12/01/2015  . Colonoscopy refused 12/01/2015  . Macular degeneration, age related, nonexudative 11/26/2015  . Cataract incipient, senile, bilateral 11/26/2015  . Onychomycosis of toenail 11/11/2015  . Aortic atherosclerosis (HCC) 11/11/2015  . Abnormal thyroid function test 11/06/2015  . Diabetes mellitus, stable (HCC) 11/05/2015  . COPD exacerbation (HCC) 11/04/2015  . AKI (acute kidney injury) (HCC) 11/03/2015  . Normocytic anemia 11/03/2015  . Anxiety 11/03/2015  . Acute exacerbation of chronic obstructive pulmonary disease (COPD) (HCC) 09/19/2012  . CAD (coronary artery disease) 11/24/2011  . Essential hypertension   . QT prolongation   . MI, acute, non ST segment elevation (HCC) 10/05/2011    Outpatient Encounter Prescriptions as of 04/22/2016  Medication Sig  . acetaZOLAMIDE (DIAMOX) 250 MG tablet Take 250 mg by mouth 2 (two) times daily.  Marland Kitchen albuterol (VENTOLIN HFA) 108 (90 Base) MCG/ACT inhaler Inhale 2 puffs into the lungs every 6 (six) hours as needed for wheezing or shortness of breath.  Marland Kitchen aspirin EC 81 MG tablet Take 81 mg by mouth daily.  Marland Kitchen  BREO ELLIPTA 100-25 MCG/INH AEPB Inhale 1 puff into the lungs daily.  . budesonide (PULMICORT) 0.5 MG/2ML nebulizer solution Take 0.5 mg by nebulization 2 (two) times daily.  . busPIRone (BUSPAR) 15 MG tablet TAKE 1 TABLET BY MOUTH TWICE DAILY  . cholecalciferol (VITAMIN D) 1000 units tablet Take 1,000 Units by mouth daily.  Marland Kitchen ipratropium-albuterol (DUONEB) 0.5-2.5 (3) MG/3ML SOLN Take 3 mLs by nebulization every 4 (four) hours.  Marland Kitchen lisinopril-hydrochlorothiazide (PRINZIDE,ZESTORETIC) 10-12.5 MG tablet Take 1 tablet by mouth daily.  . metoprolol tartrate (LOPRESSOR) 25 MG tablet Take 1 tablet (25 mg total) by mouth 2 (two) times daily.  . Multiple Vitamin (MULTIVITAMIN WITH MINERALS) TABS tablet Take 1 tablet by mouth daily.  . nitrofurantoin, macrocrystal-monohydrate, (MACROBID) 100 MG capsule Take 1 capsule (100 mg total) by mouth 2 (two) times daily. X 7 days  . potassium chloride (K-DUR) 10 MEQ tablet Take 1 tablet (10 mEq total) by mouth daily.  . potassium chloride (K-DUR,KLOR-CON) 10 MEQ tablet TAKE 1 TABLET BY MOUTH EVERY DAY  . predniSONE (DELTASONE) 20 MG tablet Take 20 mg by mouth daily as needed (for breathing problems).   . prochlorperazine (COMPAZINE) 10 MG tablet Take 1 tablet (10 mg total) by mouth 2 (two) times daily as needed for nausea or vomiting.  . tiotropium (SPIRIVA) 18 MCG inhalation capsule Place 18 mcg into inhaler and inhale daily.  . traMADol (ULTRAM) 50 MG tablet Take 1 tablet (50 mg total) by mouth every 6 (six) hours  as needed for moderate pain or severe pain.  . [DISCONTINUED] albuterol (VENTOLIN HFA) 108 (90 Base) MCG/ACT inhaler Inhale 2 puffs into the lungs every 6 (six) hours as needed for wheezing or shortness of breath.   No facility-administered encounter medications on file as of 04/22/2016.     Allergies  Allergen Reactions  . Penicillins Swelling and Other (See Comments)    Reaction:  Unspecified swelling reaction Has patient had a PCN reaction causing  immediate rash, facial/tongue/throat swelling, SOB or lightheadedness with hypotension: Yes Has patient had a PCN reaction causing severe rash involving mucus membranes or skin necrosis: No Has patient had a PCN reaction that required hospitalization No Has patient had a PCN reaction occurring within the last 10 years: Yes If all of the above answers are "NO", then may proceed with Cephalosporin use.  . Sulfa Antibiotics Hives and Other (See Comments)    Reaction:  Hallucinations   . Codeine Nausea Only  . Hydrocodone Nausea Only    Review of Systems  Constitutional: Negative for activity change, appetite change and unexpected weight change.  HENT: Negative for congestion, dental problem, postnasal drip and rhinorrhea.   Eyes: Negative for redness and visual disturbance.  Respiratory: Positive for cough, shortness of breath and wheezing.   Cardiovascular: Negative for chest pain, palpitations and leg swelling.  Gastrointestinal: Negative for abdominal pain, constipation and diarrhea.  Genitourinary: Negative for difficulty urinating and frequency.  Musculoskeletal: Negative for arthralgias and back pain.  Neurological: Negative for dizziness and headaches.  Psychiatric/Behavioral: Negative for dysphoric mood and sleep disturbance. The patient is not nervous/anxious.     BP 136/64 (BP Location: Left Arm, Patient Position: Sitting, Cuff Size: Normal)   Pulse 98   Temp 98.4 F (36.9 C) (Temporal)   Resp (!) 24   Ht 5' (1.524 m)   Wt 142 lb (64.4 kg)   SpO2 94%   BMI 27.73 kg/m   Physical Exam  Constitutional: She is oriented to person, place, and time. She appears well-developed and well-nourished.  Short of breath. In wheelchair. Oxygen dependent. Appears fatigued.  HENT:  Head: Normocephalic and atraumatic.  Right Ear: External ear normal.  Left Ear: External ear normal.  Mouth/Throat: Oropharynx is clear and moist.  Edentulous. Mucous membranes dry  Eyes: Conjunctivae are  normal. Pupils are equal, round, and reactive to light.  Neck: Normal range of motion. Neck supple. No thyromegaly present.  Cardiovascular: Normal rate, regular rhythm and normal heart sounds.   Pulmonary/Chest: She has no wheezes. She has no rales.  Decreased breath sounds. Tachypnea. No rales or wheeze  Abdominal: Soft. Bowel sounds are normal. There is no tenderness.  No CVAT or abdominal tenderness  Musculoskeletal: Normal range of motion. She exhibits no edema.  Lymphadenopathy:    She has no cervical adenopathy.  Neurological: She is alert and oriented to person, place, and time.  Skin: Skin is warm and dry.  Psychiatric: Her behavior is normal.  Stable mood. Normal cognition    ASSESSMENT/PLAN:  1. Acute cystitis with hematuria improved  2. COPD with exacerbation (HCC) Stable  Handicap parking permit signed Greater than 50% of this visit was spent in counseling and coordinating care. Car discussed with Son Lorin Picket. Total face to face time:  25 min    Patient Instructions  Continue to drink lots of water Pro-air is refilled Take antibiotic until all gone See me in 2 months Please re schedule the April visit    [name], MD

## 2016-04-23 DIAGNOSIS — J440 Chronic obstructive pulmonary disease with acute lower respiratory infection: Secondary | ICD-10-CM | POA: Diagnosis not present

## 2016-04-24 DIAGNOSIS — J440 Chronic obstructive pulmonary disease with acute lower respiratory infection: Secondary | ICD-10-CM | POA: Diagnosis not present

## 2016-04-26 DIAGNOSIS — R69 Illness, unspecified: Secondary | ICD-10-CM | POA: Diagnosis not present

## 2016-04-26 DIAGNOSIS — J440 Chronic obstructive pulmonary disease with acute lower respiratory infection: Secondary | ICD-10-CM | POA: Diagnosis not present

## 2016-04-27 DIAGNOSIS — J440 Chronic obstructive pulmonary disease with acute lower respiratory infection: Secondary | ICD-10-CM | POA: Diagnosis not present

## 2016-04-28 DIAGNOSIS — J440 Chronic obstructive pulmonary disease with acute lower respiratory infection: Secondary | ICD-10-CM | POA: Diagnosis not present

## 2016-04-29 DIAGNOSIS — J440 Chronic obstructive pulmonary disease with acute lower respiratory infection: Secondary | ICD-10-CM | POA: Diagnosis not present

## 2016-04-30 DIAGNOSIS — J440 Chronic obstructive pulmonary disease with acute lower respiratory infection: Secondary | ICD-10-CM | POA: Diagnosis not present

## 2016-05-01 DIAGNOSIS — J440 Chronic obstructive pulmonary disease with acute lower respiratory infection: Secondary | ICD-10-CM | POA: Diagnosis not present

## 2016-05-03 DIAGNOSIS — J440 Chronic obstructive pulmonary disease with acute lower respiratory infection: Secondary | ICD-10-CM | POA: Diagnosis not present

## 2016-05-04 DIAGNOSIS — J440 Chronic obstructive pulmonary disease with acute lower respiratory infection: Secondary | ICD-10-CM | POA: Diagnosis not present

## 2016-05-05 DIAGNOSIS — J440 Chronic obstructive pulmonary disease with acute lower respiratory infection: Secondary | ICD-10-CM | POA: Diagnosis not present

## 2016-05-06 ENCOUNTER — Ambulatory Visit: Payer: Self-pay | Admitting: Family Medicine

## 2016-05-06 ENCOUNTER — Encounter: Payer: Self-pay | Admitting: Family Medicine

## 2016-05-06 DIAGNOSIS — J440 Chronic obstructive pulmonary disease with acute lower respiratory infection: Secondary | ICD-10-CM | POA: Diagnosis not present

## 2016-05-07 DIAGNOSIS — J440 Chronic obstructive pulmonary disease with acute lower respiratory infection: Secondary | ICD-10-CM | POA: Diagnosis not present

## 2016-05-08 DIAGNOSIS — J440 Chronic obstructive pulmonary disease with acute lower respiratory infection: Secondary | ICD-10-CM | POA: Diagnosis not present

## 2016-05-09 DIAGNOSIS — J440 Chronic obstructive pulmonary disease with acute lower respiratory infection: Secondary | ICD-10-CM | POA: Diagnosis not present

## 2016-05-10 ENCOUNTER — Telehealth: Payer: Self-pay

## 2016-05-10 DIAGNOSIS — J440 Chronic obstructive pulmonary disease with acute lower respiratory infection: Secondary | ICD-10-CM | POA: Diagnosis not present

## 2016-05-10 NOTE — Telephone Encounter (Signed)
Telephone outreach to patient to follow up on community resource referral request received from Candis Shine, Community education officer. Patient states that son is handling medication and that if they have any questions or need anything they will call back. -ANR

## 2016-05-11 DIAGNOSIS — J9610 Chronic respiratory failure, unspecified whether with hypoxia or hypercapnia: Secondary | ICD-10-CM | POA: Diagnosis not present

## 2016-05-11 DIAGNOSIS — J440 Chronic obstructive pulmonary disease with acute lower respiratory infection: Secondary | ICD-10-CM | POA: Diagnosis not present

## 2016-05-11 DIAGNOSIS — J449 Chronic obstructive pulmonary disease, unspecified: Secondary | ICD-10-CM | POA: Diagnosis not present

## 2016-05-12 DIAGNOSIS — J440 Chronic obstructive pulmonary disease with acute lower respiratory infection: Secondary | ICD-10-CM | POA: Diagnosis not present

## 2016-05-13 DIAGNOSIS — J440 Chronic obstructive pulmonary disease with acute lower respiratory infection: Secondary | ICD-10-CM | POA: Diagnosis not present

## 2016-05-14 DIAGNOSIS — J440 Chronic obstructive pulmonary disease with acute lower respiratory infection: Secondary | ICD-10-CM | POA: Diagnosis not present

## 2016-05-15 ENCOUNTER — Emergency Department (HOSPITAL_COMMUNITY): Payer: Medicare HMO

## 2016-05-15 ENCOUNTER — Encounter (HOSPITAL_COMMUNITY): Payer: Self-pay | Admitting: *Deleted

## 2016-05-15 ENCOUNTER — Observation Stay (HOSPITAL_COMMUNITY)
Admission: EM | Admit: 2016-05-15 | Discharge: 2016-05-17 | Disposition: A | Discharge status: Home / Self Care | Payer: Medicare HMO | Attending: Family Medicine | Admitting: Family Medicine

## 2016-05-15 DIAGNOSIS — J440 Chronic obstructive pulmonary disease with acute lower respiratory infection: Secondary | ICD-10-CM | POA: Diagnosis not present

## 2016-05-15 DIAGNOSIS — I509 Heart failure, unspecified: Secondary | ICD-10-CM | POA: Insufficient documentation

## 2016-05-15 DIAGNOSIS — R0602 Shortness of breath: Secondary | ICD-10-CM | POA: Diagnosis not present

## 2016-05-15 DIAGNOSIS — Z87891 Personal history of nicotine dependence: Secondary | ICD-10-CM | POA: Diagnosis not present

## 2016-05-15 DIAGNOSIS — I130 Hypertensive heart and chronic kidney disease with heart failure and stage 1 through stage 4 chronic kidney disease, or unspecified chronic kidney disease: Secondary | ICD-10-CM | POA: Diagnosis not present

## 2016-05-15 DIAGNOSIS — J189 Pneumonia, unspecified organism: Principal | ICD-10-CM | POA: Insufficient documentation

## 2016-05-15 DIAGNOSIS — J45909 Unspecified asthma, uncomplicated: Secondary | ICD-10-CM | POA: Insufficient documentation

## 2016-05-15 DIAGNOSIS — N189 Chronic kidney disease, unspecified: Secondary | ICD-10-CM | POA: Diagnosis not present

## 2016-05-15 DIAGNOSIS — J441 Chronic obstructive pulmonary disease with (acute) exacerbation: Secondary | ICD-10-CM | POA: Diagnosis not present

## 2016-05-15 DIAGNOSIS — Z7982 Long term (current) use of aspirin: Secondary | ICD-10-CM | POA: Diagnosis not present

## 2016-05-15 DIAGNOSIS — Z79899 Other long term (current) drug therapy: Secondary | ICD-10-CM | POA: Diagnosis not present

## 2016-05-15 DIAGNOSIS — I10 Essential (primary) hypertension: Secondary | ICD-10-CM | POA: Diagnosis present

## 2016-05-15 DIAGNOSIS — R069 Unspecified abnormalities of breathing: Secondary | ICD-10-CM | POA: Diagnosis not present

## 2016-05-15 DIAGNOSIS — J449 Chronic obstructive pulmonary disease, unspecified: Secondary | ICD-10-CM | POA: Insufficient documentation

## 2016-05-15 DIAGNOSIS — E119 Type 2 diabetes mellitus without complications: Secondary | ICD-10-CM

## 2016-05-15 LAB — BASIC METABOLIC PANEL
ANION GAP: 8 (ref 5–15)
BUN: 21 mg/dL — AB (ref 6–20)
CO2: 29 mmol/L (ref 22–32)
Calcium: 9.4 mg/dL (ref 8.9–10.3)
Chloride: 98 mmol/L — ABNORMAL LOW (ref 101–111)
Creatinine, Ser: 1.26 mg/dL — ABNORMAL HIGH (ref 0.44–1.00)
GFR calc Af Amer: 48 mL/min — ABNORMAL LOW (ref >60)
GFR, EST NON AFRICAN AMERICAN: 42 mL/min — AB (ref >60)
Glucose, Bld: 145 mg/dL — ABNORMAL HIGH (ref 65–99)
POTASSIUM: 3.1 mmol/L — AB (ref 3.5–5.1)
SODIUM: 135 mmol/L (ref 135–145)

## 2016-05-15 LAB — LACTIC ACID, PLASMA: Lactic Acid, Venous: 1.6 mmol/L (ref 0.5–1.9)

## 2016-05-15 LAB — CBC
HEMATOCRIT: 33.4 % — AB (ref 36.0–46.0)
HEMOGLOBIN: 10.1 g/dL — AB (ref 12.0–15.0)
MCH: 28.5 pg (ref 26.0–34.0)
MCHC: 30.2 g/dL (ref 30.0–36.0)
MCV: 94.1 fL (ref 78.0–100.0)
Platelets: 214 10*3/uL (ref 150–400)
RBC: 3.55 MIL/uL — ABNORMAL LOW (ref 3.87–5.11)
RDW: 13.8 % (ref 11.5–15.5)
WBC: 6.5 10*3/uL (ref 4.0–10.5)

## 2016-05-15 LAB — TROPONIN I

## 2016-05-15 LAB — BRAIN NATRIURETIC PEPTIDE: B NATRIURETIC PEPTIDE 5: 19 pg/mL (ref 0.0–100.0)

## 2016-05-15 MED ORDER — IPRATROPIUM BROMIDE 0.02 % IN SOLN
RESPIRATORY_TRACT | Status: AC
  Administered 2016-05-15: 1 mg
  Filled 2016-05-15: qty 5

## 2016-05-15 MED ORDER — LEVALBUTEROL HCL 0.63 MG/3ML IN NEBU
INHALATION_SOLUTION | RESPIRATORY_TRACT | Status: AC
  Administered 2016-05-15: 1.26 mg
  Filled 2016-05-15: qty 6

## 2016-05-15 MED ORDER — METHYLPREDNISOLONE SODIUM SUCC 125 MG IJ SOLR
125.0000 mg | Freq: Once | INTRAMUSCULAR | Status: AC
Start: 2016-05-15 — End: 2016-05-15
  Administered 2016-05-15: 125 mg via INTRAVENOUS
  Filled 2016-05-15: qty 2

## 2016-05-15 NOTE — ED Triage Notes (Signed)
Pt arrived to er by [facility] with c/o sob, pt states that she felt bad yesterday and started getting sob today, pt was given two albuterol treatments prior to arrival to er with improvement in symptoms, pt arrived to er with pursed lip breathing, pt wears oxygen at 3 lpm via [place] continuously at home,

## 2016-05-15 NOTE — ED Notes (Signed)
ekg given to Dr Zackowski 

## 2016-05-15 NOTE — ED Provider Notes (Signed)
[facility]-EMERG[facility]Y DEPT Provider Note   CSN: [id number] Arrival date & time: 05/15/16  2235  By signing my name below, I, Bing Neighbors., attest that this documentation has been prepared under the direction and in the presence of Hillary Bow, DO. Electronically signed: Bing Neighbors., ED Scribe. 05/16/16. 3:42 AM.  Time seen 23:22 PM  History   Chief Complaint Chief Complaint  Patient presents with  . Shortness of Breath    HPI Sheila Pierce is a 71 y.o. female who presents to the Emergency Department via EMS complaining of SOB that got acutely worse before dark tonight. She reportedly tried her nebulizer breathing treatments today with no relief. Pt reports productive cough (whitish and yellow sputum.) She denies any modifying factors. Pt denies wheezing, chest tightness, fever, leg swelling. Of note, pt is on 3L O2 at baseline.Pt is a former smoker (x5 years.) Pt states that she lives at home with 2 of her sons. She denies any recent hospital admission, the last was Nov, 2017.   The history is provided by the patient. No language interpreter was used.   PCP [name], MD Pulmonary Dr Juanetta Gosling  Past Medical History:  Diagnosis Date  . Allergy   . Anemia   . Anxiety   . Arthritis   . Asthma   . Cataract   . Chronic kidney disease   . COPD (chronic obstructive pulmonary disease) (HCC)    Chronic resp failure on home O2  . Depression   . Diabetes mellitus without complication (HCC)   . Emphysema of lung (HCC)   . GERD (gastroesophageal reflux disease)   . Hypercholesteremia 12/16/2015  . Hyperglycemia    Noted 09/2011 admission in setting of steroid use with normal HgbA1C.  Marland Kitchen Hypertension   . Hypokalemia   . Hyponatremia    Noted 09/2011 admission.  . NSTEMI (non-ST elevated myocardial infarction) (HCC)    a. 09/2011 in setting of acute on chronic resp failure/COPD exacerbation --> cath demonstrated nonobstructive CAD 10/05/11 with EF 50%;   08/2012 elevated Ti in setting of COPD flare -->Echo: EF 60-65%, Gr 1 DD -->Med Rx.  . Osteoporosis   . Oxygen deficiency   . QT prolongation    Noted on EKG 09/2011 (590 in setting of K of 3, improved to 475 by discharge)  . Tobacco abuse 09/19/2012  . Trigeminal neuralgia     Patient Active Problem List   Diagnosis Date Noted  . COPD (chronic obstructive pulmonary disease) (HCC) 12/19/2015  . Acute on chronic respiratory failure with hypoxia (HCC) 12/19/2015  . Hypercholesteremia 12/16/2015  . Osteoporosis 12/01/2015  . Colonoscopy refused 12/01/2015  . Macular degeneration, age related, nonexudative 11/26/2015  . Cataract incipient, senile, bilateral 11/26/2015  . Onychomycosis of toenail 11/11/2015  . Aortic atherosclerosis (HCC) 11/11/2015  . Abnormal thyroid function test 11/06/2015  . Diabetes mellitus, stable (HCC) 11/05/2015  . COPD exacerbation (HCC) 11/04/2015  . AKI (acute kidney injury) (HCC) 11/03/2015  . Normocytic anemia 11/03/2015  . Anxiety 11/03/2015  . Acute exacerbation of chronic obstructive pulmonary disease (COPD) (HCC) 09/19/2012  . CAD (coronary artery disease) 11/24/2011  . Essential hypertension   . QT prolongation   . MI, acute, non ST segment elevation (HCC) 10/05/2011    Past Surgical History:  Procedure Laterality Date  . ABDOMINAL HYSTERECTOMY     bleeding  . CHOLECYSTECTOMY    . LEFT HEART CATHETERIZATION WITH CORONARY ANGIOGRAM N/A 10/05/2011   Procedure: LEFT HEART CATHETERIZATION WITH  CORONARY ANGIOGRAM;  Surgeon: Tonny Bollman, MD;  Location: [facility] CATH LAB;  Service: Cardiovascular;  Laterality: N/A;  . PLANTAR'S WART EXCISION      OB History    No data available       Home Medications    Prior to Admission medications   Medication Sig Start Date End Date Taking? Authorizing Provider  acetaZOLAMIDE (DIAMOX) 250 MG tablet Take 250 mg by mouth 2 (two) times daily.    Historical Provider, MD  albuterol (VENTOLIN HFA) 108 (90 Base)  MCG/ACT inhaler Inhale 2 puffs into the lungs every 6 (six) hours as needed for wheezing or shortness of breath. 04/22/16   [name], MD  aspirin EC 81 MG tablet Take 81 mg by mouth daily.    Historical Provider, MD  BREO ELLIPTA 100-25 MCG/INH AEPB Inhale 1 puff into the lungs daily. 11/11/15   [name], MD  budesonide (PULMICORT) 0.5 MG/2ML nebulizer solution Take 0.5 mg by nebulization 2 (two) times daily.    Historical Provider, MD  busPIRone (BUSPAR) 15 MG tablet TAKE 1 TABLET BY MOUTH TWICE DAILY 02/23/16   [name], MD  cholecalciferol (VITAMIN D) 1000 units tablet Take 1,000 Units by mouth daily.    Historical Provider, MD  ipratropium-albuterol (DUONEB) 0.5-2.5 (3) MG/3ML SOLN Take 3 mLs by nebulization every 4 (four) hours. 11/11/15   [name], MD  lisinopril-hydrochlorothiazide (PRINZIDE,ZESTORETIC) 10-12.5 MG tablet Take 1 tablet by mouth daily. 11/11/15   [name], MD  metoprolol tartrate (LOPRESSOR) 25 MG tablet Take 1 tablet (25 mg total) by mouth 2 (two) times daily. 11/11/15   [name], MD  Multiple Vitamin (MULTIVITAMIN WITH MINERALS) TABS tablet Take 1 tablet by mouth daily.    Historical Provider, MD  potassium chloride (K-DUR) 10 MEQ tablet Take 1 tablet (10 mEq total) by mouth daily. 03/25/16   [name], MD  predniSONE (DELTASONE) 20 MG tablet Take 20 mg by mouth daily as needed (for breathing problems).     Historical Provider, MD  prochlorperazine (COMPAZINE) 10 MG tablet Take 1 tablet (10 mg total) by mouth 2 (two) times daily as needed for nausea or vomiting. 04/16/16   Bethann Berkshire, MD  tiotropium (SPIRIVA) 18 MCG inhalation capsule Place 18 mcg into inhaler and inhale daily.    Historical Provider, MD  traMADol (ULTRAM) 50 MG tablet Take 1 tablet (50 mg total) by mouth every 6 (six) hours as needed for moderate pain or severe pain. 11/11/15   [name], MD    Family History Family History  Problem  Relation Age of Onset  . Cancer Father     Lung  . Cancer Mother     Liver  . Hypertension Son   . Hyperlipidemia Son   . Post-traumatic stress disorder Son   . Depression Son   . Kidney disease Brother     liver and kidney failure  . Hypertension Son   . Hyperlipidemia Son   . Cancer Other     colon  . Diabetes Son   . Lung disease Daughter     passed away at 83 months old    Social History Social History  Substance Use Topics  . Smoking status: Former Smoker    Packs/day: 1.00    Years: 40.00    Types: Cigarettes    Start date: 01/26/1964    Quit date: 10/2014  . Smokeless tobacco: Never Used  . Alcohol use Yes  Comment: occasional  pt is on home oxygen 3 lpm [place] 24/7 2 sons live with her Quit smoking 5 years ago   Allergies   Penicillins; Sulfa antibiotics; Codeine; and Hydrocodone   Review of Systems Review of Systems  Constitutional: Negative for fever.  Respiratory: Positive for cough and shortness of breath. Negative for chest tightness and wheezing.   Cardiovascular: Negative for leg swelling.  All other systems reviewed and are negative.    Physical Exam Updated Vital Signs BP (!) 140/56   Pulse (!) 109   Temp 97.8 F (36.6 C) (Oral)   Resp (!) 30   Ht 5' (1.524 m)   Wt 142 lb (64.4 kg)   SpO2 99%   BMI 27.73 kg/m   Vital signs normal except for tachycardia   Physical Exam  Constitutional: She is oriented to person, place, and time. She appears well-developed and well-nourished.  Non-toxic appearance. She does not appear ill. She appears distressed.  HENT:  Head: Normocephalic and atraumatic.  Right Ear: External ear normal.  Left Ear: External ear normal.  Nose: Nose normal. No mucosal edema or rhinorrhea.  Mouth/Throat: Oropharynx is clear and moist and mucous membranes are normal. No dental abscesses or uvula swelling.  Eyes: Conjunctivae and EOM are normal. Pupils are equal, round, and reactive to light.  Neck: Normal range of  motion and full passive range of motion without pain. Neck supple.  Cardiovascular: Normal rate, regular rhythm and normal heart sounds.  Exam reveals no gallop and no friction rub.   No murmur heard. Pulmonary/Chest: Accessory muscle usage present. Tachypnea noted. She is in respiratory distress. She has decreased breath sounds. She has no wheezes. She has no rhonchi. She has no rales. She exhibits no tenderness and no crepitus.  Pursed lip breathing.  Abdominal: Soft. Normal appearance and bowel sounds are normal. She exhibits no distension. There is no tenderness. There is no rebound and no guarding.  Musculoskeletal: Normal range of motion. She exhibits no edema or tenderness.  Moves all extremities well.   Neurological: She is alert and oriented to person, place, and time. She has normal strength. No cranial nerve deficit.  Skin: Skin is warm, dry and intact. No rash noted. No erythema. No pallor.  Psychiatric: She has a normal mood and affect. Her speech is normal and behavior is normal. Her mood appears not anxious.  Nursing note and vitals reviewed.    ED Treatments / Results   ,DIAGNOSTIC STUDIES: Oxygen Saturation is 99% on RA, normal by my interpretation.    Labs (all labs ordered are listed, but only abnormal results are displayed) Results for orders placed or performed during the hospital encounter of 05/15/16  Culture, blood (routine x 2)  Result Value Ref Range   Specimen Description RIGHT ANTECUBITAL    Special Requests      BOTTLES DRAWN AEROBIC AND ANAEROBIC Blood Culture adequate volume   Culture PENDING    Report Status PENDING   Culture, blood (routine x 2)  Result Value Ref Range   Specimen Description BLOOD RIGHT ARM    Special Requests      BOTTLES DRAWN AEROBIC ONLY Blood Culture results may not be optimal due to an inadequate volume of blood received in culture bottles   Culture PENDING    Report Status PENDING   Basic metabolic panel  Result Value  Ref Range   Sodium 135 135 - 145 mmol/L   Potassium 3.1 (L) 3.5 - 5.1 mmol/L   Chloride 98 (  L) 101 - 111 mmol/L   CO2 29 22 - 32 mmol/L   Glucose, Bld 145 (H) 65 - 99 mg/dL   BUN 21 (H) 6 - 20 mg/dL   Creatinine, Ser 1.61 (H) 0.44 - 1.00 mg/dL   Calcium 9.4 8.9 - 09.6 mg/dL   GFR calc non Af Amer 42 (L) >60 mL/min   GFR calc Af Amer 48 (L) >60 mL/min   Anion gap 8 5 - 15  CBC  Result Value Ref Range   WBC 6.5 4.0 - 10.5 K/uL   RBC 3.55 (L) 3.87 - 5.11 MIL/uL   Hemoglobin 10.1 (L) 12.0 - 15.0 g/dL   HCT 04.5 (L) 40.9 - 81.1 %   MCV 94.1 78.0 - 100.0 fL   MCH 28.5 26.0 - 34.0 pg   MCHC 30.2 30.0 - 36.0 g/dL   RDW 91.4 78.2 - 95.6 %   Platelets 214 150 - 400 K/uL  Lactic acid, plasma  Result Value Ref Range   Lactic Acid, Venous 1.6 0.5 - 1.9 mmol/L  Lactic acid, plasma  Result Value Ref Range   Lactic Acid, Venous 1.2 0.5 - 1.9 mmol/L  Brain natriuretic peptide  Result Value Ref Range   B Natriuretic Peptide 19.0 0.0 - 100.0 pg/mL  Troponin I  Result Value Ref Range   Troponin I <0.03 <0.03 ng/mL   Laboratory interpretation all normal except stable anemia    EKG  EKG Interpretation  Date/Time:  Saturday May 15 2016 22:43:45 EDT Ventricular Rate:  109 PR Interval:    QRS Duration: 100 QT Interval:  318 QTC Calculation: 429 R Axis:   80 Text Interpretation:  Sinus tachycardia Minimal ST depression, lateral leads No significant change since last tracing 19 Dec 2015 Confirmed by [facility]  MD-I, Evelyna Folker ([id number]) on 05/16/2016 1:00:55 AM       Radiology Dg Chest Portable 1 View  Result Date: 05/15/2016 CLINICAL DATA:  Acute onset of shortness of breath. Initial encounter. EXAM: PORTABLE CHEST 1 VIEW COMPARISON:  Chest radiograph performed 12/19/2015 FINDINGS: The lungs are well-aerated. Vascular congestion is noted. Patchy bibasilar airspace opacities raise concern for pulmonary edema. The lung bases are incompletely imaged on this study. No pneumothorax is seen. The  cardiomediastinal silhouette is within normal limits. No acute osseous abnormalities are seen. IMPRESSION: Vascular congestion. Patchy bibasilar airspace opacities raise concern for pulmonary edema. Electronically Signed   By: Roanna Raider M.D.   On: 05/15/2016 23:17    Procedures Procedures (including critical care time)  Medications Ordered in ED Medications  doxycycline (VIBRAMYCIN) 100 mg in dextrose 5 % 250 mL IVPB (0 mg Intravenous Stopped 05/16/16 0320)  levalbuterol (XOPENEX) 0.63 MG/3ML nebulizer solution (1.26 mg  Given 05/15/16 2308)  ipratropium (ATROVENT) 0.02 % nebulizer solution (1 mg  Given 05/15/16 2308)  methylPREDNISolone sodium succinate (SOLU-MEDROL) 125 mg/2 mL injection 125 mg (125 mg Intravenous Given 05/15/16 2351)  potassium chloride SA (K-DUR,KLOR-CON) CR tablet 40 mEq (40 mEq Oral Given 05/16/16 0109)     Initial Impression / Assessment and Plan / ED Course  I have reviewed the triage vital signs and the nursing notes.  Pertinent labs & imaging results that were available during my care of the patient were reviewed by me and considered in my medical decision making (see chart for details).   COORDINATION OF CARE: -Discussed next steps with pt. Pt verbalized understanding and is agreeable with the plan. Patient received albuterol and Atrovent nebulizer. She was given IV Solu-Medrol for her COPD  exacerbation.  After reviewing her chest x-ray in her lab results, she does not appear to have pulmonary edema with a normal BMP. Blood cultures were obtained and she was started on doxycycline IV. She has prolonged QT and therefore Zithromax and Levaquin were a concern to be given.  Recheck at 1 AM, patient states her breathing is at its baseline, however she still appears to be a little bit tachypnea. She still has diminished breath sounds but no wheezing. She still has mild retractions. Her son agrees this appears to be her baseline. However with her chest x-ray being  concerning for pneumonia and she already is on home oxygen 3 L/m it was felt she would need inpatient admission. Patient is agreeable.  01:40 AM Dr Laban Emperor, hospitalist will admit.      Final Clinical Impressions(s) / ED Diagnoses   Final diagnoses:  COPD exacerbation (HCC)  Community acquired pneumonia, unspecified laterality    Plan admission  Devoria Albe, MD, FACEP   I personally performed the services described in this documentation, which was scribed in my presence. The recorded information has been reviewed and considered.  Devoria Albe, MD, Concha Pyo, MD 05/16/16 [phone]

## 2016-05-16 ENCOUNTER — Encounter (HOSPITAL_COMMUNITY): Payer: Self-pay

## 2016-05-16 DIAGNOSIS — J441 Chronic obstructive pulmonary disease with (acute) exacerbation: Secondary | ICD-10-CM

## 2016-05-16 DIAGNOSIS — J189 Pneumonia, unspecified organism: Principal | ICD-10-CM | POA: Diagnosis present

## 2016-05-16 DIAGNOSIS — E119 Type 2 diabetes mellitus without complications: Secondary | ICD-10-CM | POA: Diagnosis not present

## 2016-05-16 DIAGNOSIS — I10 Essential (primary) hypertension: Secondary | ICD-10-CM

## 2016-05-16 LAB — GLUCOSE, CAPILLARY
GLUCOSE-CAPILLARY: 130 mg/dL — AB (ref 65–99)
Glucose-Capillary: 166 mg/dL — ABNORMAL HIGH (ref 65–99)
Glucose-Capillary: 142 mg/dL — ABNORMAL HIGH (ref 65–99)

## 2016-05-16 LAB — LACTIC ACID, PLASMA: Lactic Acid, Venous: 1.2 mmol/L (ref 0.5–1.9)

## 2016-05-16 MED ORDER — SODIUM CHLORIDE 0.9 % IV BOLUS (SEPSIS): 1000.0000 mL | Freq: Once | INTRAVENOUS | Status: DC

## 2016-05-16 MED ORDER — BUDESONIDE 0.5 MG/2ML IN SUSP
0.5000 mg | Freq: Two times a day (BID) | RESPIRATORY_TRACT | Status: DC
  Administered 2016-05-16 – 2016-05-17 (×3): 0.5 mg via RESPIRATORY_TRACT
  Filled 2016-05-16 (×3): qty 2

## 2016-05-16 MED ORDER — PROCHLORPERAZINE MALEATE 5 MG PO TABS: 10.0000 mg | ORAL_TABLET | Freq: Two times a day (BID) | ORAL | Status: DC | PRN

## 2016-05-16 MED ORDER — DOXYCYCLINE HYCLATE 100 MG IV SOLR
INTRAVENOUS | Status: AC
  Filled 2016-05-16: qty 100

## 2016-05-16 MED ORDER — PREDNISONE 20 MG PO TABS
40.0000 mg | ORAL_TABLET | Freq: Every day | ORAL | Status: DC
  Administered 2016-05-16 – 2016-05-17 (×2): 40 mg via ORAL
  Filled 2016-05-16 (×2): qty 2

## 2016-05-16 MED ORDER — PHENOL 1.4 % MT LIQD
1.0000 | OROMUCOSAL | Status: DC | PRN
  Administered 2016-05-16: 1 via OROMUCOSAL
  Filled 2016-05-16: qty 177

## 2016-05-16 MED ORDER — ALBUTEROL SULFATE (2.5 MG/3ML) 0.083% IN NEBU: 2.5000 mg | INHALATION_SOLUTION | RESPIRATORY_TRACT | Status: DC | PRN

## 2016-05-16 MED ORDER — ORAL CARE MOUTH RINSE
15.0000 mL | Freq: Two times a day (BID) | OROMUCOSAL | Status: DC
  Administered 2016-05-16 (×2): 15 mL via OROMUCOSAL

## 2016-05-16 MED ORDER — POTASSIUM CHLORIDE CRYS ER 20 MEQ PO TBCR
40.0000 meq | EXTENDED_RELEASE_TABLET | Freq: Once | ORAL | Status: AC
  Administered 2016-05-16: 40 meq via ORAL
  Filled 2016-05-16: qty 2

## 2016-05-16 MED ORDER — ASPIRIN EC 81 MG PO TBEC
81.0000 mg | DELAYED_RELEASE_TABLET | Freq: Every day | ORAL | Status: DC
  Administered 2016-05-16 – 2016-05-17 (×2): 81 mg via ORAL
  Filled 2016-05-16 (×2): qty 1

## 2016-05-16 MED ORDER — POTASSIUM CHLORIDE CRYS ER 20 MEQ PO TBCR
20.0000 meq | EXTENDED_RELEASE_TABLET | Freq: Two times a day (BID) | ORAL | Status: DC
  Administered 2016-05-16 – 2016-05-17 (×2): 20 meq via ORAL
  Filled 2016-05-16 (×2): qty 1

## 2016-05-16 MED ORDER — TRAMADOL HCL 50 MG PO TABS
50.0000 mg | ORAL_TABLET | Freq: Four times a day (QID) | ORAL | Status: DC | PRN
  Administered 2016-05-16: 50 mg via ORAL
  Filled 2016-05-16: qty 1

## 2016-05-16 MED ORDER — LISINOPRIL 10 MG PO TABS
10.0000 mg | ORAL_TABLET | Freq: Every day | ORAL | Status: DC
  Administered 2016-05-16 – 2016-05-17 (×2): 10 mg via ORAL
  Filled 2016-05-16 (×2): qty 1

## 2016-05-16 MED ORDER — FLUTICASONE FUROATE-VILANTEROL 100-25 MCG/INH IN AEPB
1.0000 | INHALATION_SPRAY | Freq: Every day | RESPIRATORY_TRACT | Status: DC
  Administered 2016-05-16 – 2016-05-17 (×2): 1 via RESPIRATORY_TRACT
  Filled 2016-05-16: qty 28

## 2016-05-16 MED ORDER — HYDROCHLOROTHIAZIDE 12.5 MG PO CAPS
12.5000 mg | ORAL_CAPSULE | Freq: Every day | ORAL | Status: DC
  Administered 2016-05-16 – 2016-05-17 (×2): 12.5 mg via ORAL
  Filled 2016-05-16 (×2): qty 1

## 2016-05-16 MED ORDER — POTASSIUM CHLORIDE CRYS ER 10 MEQ PO TBCR
10.0000 meq | EXTENDED_RELEASE_TABLET | Freq: Every day | ORAL | Status: DC
  Administered 2016-05-16: 10 meq via ORAL
  Filled 2016-05-16 (×3): qty 1

## 2016-05-16 MED ORDER — ACETAZOLAMIDE 250 MG PO TABS
250.0000 mg | ORAL_TABLET | Freq: Two times a day (BID) | ORAL | Status: DC
  Administered 2016-05-16 – 2016-05-17 (×3): 250 mg via ORAL
  Filled 2016-05-16 (×7): qty 1

## 2016-05-16 MED ORDER — METOPROLOL TARTRATE 25 MG PO TABS
25.0000 mg | ORAL_TABLET | Freq: Two times a day (BID) | ORAL | Status: DC
  Administered 2016-05-16 – 2016-05-17 (×3): 25 mg via ORAL
  Filled 2016-05-16 (×3): qty 1

## 2016-05-16 MED ORDER — ALBUTEROL SULFATE (2.5 MG/3ML) 0.083% IN NEBU: 3.0000 mL | INHALATION_SOLUTION | Freq: Four times a day (QID) | RESPIRATORY_TRACT | Status: DC | PRN

## 2016-05-16 MED ORDER — BUSPIRONE HCL 15 MG PO TABS
15.0000 mg | ORAL_TABLET | Freq: Two times a day (BID) | ORAL | Status: DC
  Administered 2016-05-16 – 2016-05-17 (×4): 15 mg via ORAL
  Filled 2016-05-16 (×4): qty 1

## 2016-05-16 MED ORDER — DEXTROSE 5 % IV SOLN
100.0000 mg | Freq: Two times a day (BID) | INTRAVENOUS | Status: DC
  Administered 2016-05-16 – 2016-05-17 (×3): 100 mg via INTRAVENOUS
  Filled 2016-05-16 (×8): qty 100

## 2016-05-16 MED ORDER — IPRATROPIUM-ALBUTEROL 0.5-2.5 (3) MG/3ML IN SOLN
3.0000 mL | RESPIRATORY_TRACT | Status: DC
  Administered 2016-05-16 – 2016-05-17 (×7): 3 mL via RESPIRATORY_TRACT
  Filled 2016-05-16 (×7): qty 3

## 2016-05-16 MED ORDER — TIOTROPIUM BROMIDE MONOHYDRATE 18 MCG IN CAPS
18.0000 ug | ORAL_CAPSULE | Freq: Every day | RESPIRATORY_TRACT | Status: DC
  Administered 2016-05-16 – 2016-05-17 (×2): 18 ug via RESPIRATORY_TRACT
  Filled 2016-05-16: qty 5

## 2016-05-16 MED ORDER — DEXTROSE 5 % IV SOLN: 1.0000 g | INTRAVENOUS | Status: DC

## 2016-05-16 MED ORDER — ADULT MULTIVITAMIN W/MINERALS CH
1.0000 | ORAL_TABLET | Freq: Every day | ORAL | Status: DC
  Administered 2016-05-16 – 2016-05-17 (×2): 1 via ORAL
  Filled 2016-05-16 (×2): qty 1

## 2016-05-16 MED ORDER — VITAMIN D 1000 UNITS PO TABS
1000.0000 [IU] | ORAL_TABLET | Freq: Every day | ORAL | Status: DC
  Administered 2016-05-16 – 2016-05-17 (×2): 1000 [IU] via ORAL
  Filled 2016-05-16 (×2): qty 1

## 2016-05-16 MED ORDER — ENOXAPARIN SODIUM 40 MG/0.4ML ~~LOC~~ SOLN
40.0000 mg | SUBCUTANEOUS | Status: DC
  Administered 2016-05-16 – 2016-05-17 (×2): 40 mg via SUBCUTANEOUS
  Filled 2016-05-16 (×2): qty 0.4

## 2016-05-16 NOTE — ED Notes (Signed)
Family at bedside.  Admitting physician at bedside.

## 2016-05-16 NOTE — H&P (Signed)
History and Physical    Sheila Pierce DGL:[id number] DOB: 1946/01/20 DOA: 05/15/2016  PCP: [name], MD  Patient coming from: Home  I have personally briefly reviewed patient's old medical records in [facility]  Chief Complaint: SOB  HPI: Sheila Pierce is a 71 y.o. female with medical history significant of end stage COPD requiring 3L O2 via [place] at baseline.  Looks like she also takes Prednisone 20mg  as needed for breathing at home, and is on Diamox chronically (presumably as respiratory stimulant?).  Patient presents to the ED with severe SOB and wheezing.  Dr. Juanetta Gosling is patients pulmonologist.  Patient is a former smoker (quit several years ago but before that smoked for several decades).  She lives at home with 2 sons.  Patient reports 4 hr history of SOB, neb treatments with no relief.  Productive cough.  No fevers, chills.   ED Course: CXR shows bibasilar patchy opacities, no WBC, no fever.  After breathing treatments her breathing is back to baseline per patient although she looks to still be working to breath.  O2 sat is currently 97% on 2.5L   Review of Systems: As per HPI otherwise 10 point review of systems negative.   Past Medical History:  Diagnosis Date  . Allergy   . Anemia   . Anxiety   . Arthritis   . Asthma   . Cataract   . Chronic kidney disease   . COPD (chronic obstructive pulmonary disease) (HCC)    Chronic resp failure on home O2  . Depression   . Diabetes mellitus without complication (HCC)   . Emphysema of lung (HCC)   . GERD (gastroesophageal reflux disease)   . Hypercholesteremia 12/16/2015  . Hyperglycemia    Noted 09/2011 admission in setting of steroid use with normal HgbA1C.  Marland Kitchen Hypertension   . Hypokalemia   . Hyponatremia    Noted 09/2011 admission.  . NSTEMI (non-ST elevated myocardial infarction) (HCC)    a. 09/2011 in setting of acute on chronic resp failure/COPD exacerbation --> cath demonstrated nonobstructive CAD 10/05/11  with EF 50%;  08/2012 elevated Ti in setting of COPD flare -->Echo: EF 60-65%, Gr 1 DD -->Med Rx.  . Osteoporosis   . Oxygen deficiency   . QT prolongation    Noted on EKG 09/2011 (590 in setting of K of 3, improved to 475 by discharge)  . Tobacco abuse 09/19/2012  . Trigeminal neuralgia     Past Surgical History:  Procedure Laterality Date  . ABDOMINAL HYSTERECTOMY     bleeding  . CHOLECYSTECTOMY    . LEFT HEART CATHETERIZATION WITH CORONARY ANGIOGRAM N/A 10/05/2011   Procedure: LEFT HEART CATHETERIZATION WITH CORONARY ANGIOGRAM;  Surgeon: Tonny Bollman, MD;  Location: [facility] CATH LAB;  Service: Cardiovascular;  Laterality: N/A;  . PLANTAR'S WART EXCISION       reports that she quit smoking about 18 months ago. Her smoking use included Cigarettes. She started smoking about 52 years ago. She has a 40.00 pack-year smoking history. She has never used smokeless tobacco. She reports that she drinks alcohol. She reports that she does not use drugs.  Allergies  Allergen Reactions  . Penicillins Swelling and Other (See Comments)    Reaction:  Unspecified swelling reaction Has patient had a PCN reaction causing immediate rash, facial/tongue/throat swelling, SOB or lightheadedness with hypotension: Yes Has patient had a PCN reaction causing severe rash involving mucus membranes or skin necrosis: No Has patient had a PCN reaction that required  hospitalization No Has patient had a PCN reaction occurring within the last 10 years: Yes If all of the above answers are "NO", then may proceed with Cephalosporin use.  . Sulfa Antibiotics Hives and Other (See Comments)    Reaction:  Hallucinations   . Codeine Nausea Only  . Hydrocodone Nausea Only    Family History  Problem Relation Age of Onset  . Cancer Father     Lung  . Cancer Mother     Liver  . Hypertension Son   . Hyperlipidemia Son   . Post-traumatic stress disorder Son   . Depression Son   . Kidney disease Brother     liver and kidney  failure  . Hypertension Son   . Hyperlipidemia Son   . Cancer Other     colon  . Diabetes Son   . Lung disease Daughter     passed away at 25 months old     Prior to Admission medications   Medication Sig Start Date End Date Taking? Authorizing Provider  acetaZOLAMIDE (DIAMOX) 250 MG tablet Take 250 mg by mouth 2 (two) times daily.    Historical Provider, MD  albuterol (VENTOLIN HFA) 108 (90 Base) MCG/ACT inhaler Inhale 2 puffs into the lungs every 6 (six) hours as needed for wheezing or shortness of breath. 04/22/16   [name], MD  aspirin EC 81 MG tablet Take 81 mg by mouth daily.    Historical Provider, MD  BREO ELLIPTA 100-25 MCG/INH AEPB Inhale 1 puff into the lungs daily. 11/11/15   [name], MD  budesonide (PULMICORT) 0.5 MG/2ML nebulizer solution Take 0.5 mg by nebulization 2 (two) times daily.    Historical Provider, MD  busPIRone (BUSPAR) 15 MG tablet TAKE 1 TABLET BY MOUTH TWICE DAILY 02/23/16   [name], MD  cholecalciferol (VITAMIN D) 1000 units tablet Take 1,000 Units by mouth daily.    Historical Provider, MD  ipratropium-albuterol (DUONEB) 0.5-2.5 (3) MG/3ML SOLN Take 3 mLs by nebulization every 4 (four) hours. 11/11/15   [name], MD  lisinopril-hydrochlorothiazide (PRINZIDE,ZESTORETIC) 10-12.5 MG tablet Take 1 tablet by mouth daily. 11/11/15   [name], MD  metoprolol tartrate (LOPRESSOR) 25 MG tablet Take 1 tablet (25 mg total) by mouth 2 (two) times daily. 11/11/15   [name], MD  Multiple Vitamin (MULTIVITAMIN WITH MINERALS) TABS tablet Take 1 tablet by mouth daily.    Historical Provider, MD  potassium chloride (K-DUR) 10 MEQ tablet Take 1 tablet (10 mEq total) by mouth daily. 03/25/16   [name], MD  predniSONE (DELTASONE) 20 MG tablet Take 20 mg by mouth daily as needed (for breathing problems).     Historical Provider, MD  prochlorperazine (COMPAZINE) 10 MG tablet Take 1 tablet (10 mg total) by mouth 2 (two)  times daily as needed for nausea or vomiting. 04/16/16   Bethann Berkshire, MD  tiotropium (SPIRIVA) 18 MCG inhalation capsule Place 18 mcg into inhaler and inhale daily.    Historical Provider, MD  traMADol (ULTRAM) 50 MG tablet Take 1 tablet (50 mg total) by mouth every 6 (six) hours as needed for moderate pain or severe pain. 11/11/15   [name], MD    Physical Exam: Vitals:   05/16/16 0000 05/16/16 0030 05/16/16 0100 05/16/16 0130  BP: 121/67 (!) 143/54 (!) 166/76 (!) 113/55  Pulse: (!) 110 (!) 111 (!) 114 (!) 110  Resp: 16 19 18 17   Temp:  TempSrc:      SpO2: 98% 98% 99% 97%  Weight:      Height:        Constitutional: NAD, calm, comfortable Eyes: PERRL, lids and conjunctivae normal ENMT: Mucous membranes are moist. Posterior pharynx clear of any exudate or lesions.Normal dentition.  Neck: normal, supple, no masses, no thyromegaly Respiratory: diffuse wheezing Cardiovascular: Regular rate and rhythm, no murmurs / rubs / gallops. No extremity edema. 2+ pedal pulses. No carotid bruits.  Abdomen: no tenderness, no masses palpated. No hepatosplenomegaly. Bowel sounds positive.  Musculoskeletal: no clubbing / cyanosis. No joint deformity upper and lower extremities. Good ROM, no contractures. Normal muscle tone.  Skin: no rashes, lesions, ulcers. No induration Neurologic: CN 2-12 grossly intact. Sensation intact, DTR normal. Strength 5/5 in all 4.  Psychiatric: Normal judgment and insight. Alert and oriented x 3. Normal mood.    Labs on Admission: I have personally reviewed following labs and imaging studies  CBC:  Recent Labs Lab 05/15/16 2247  WBC 6.5  HGB 10.1*  HCT 33.4*  MCV 94.1  PLT 214   Basic Metabolic Panel:  Recent Labs Lab 05/15/16 2247  NA 135  K 3.1*  CL 98*  CO2 29  GLUCOSE 145*  BUN 21*  CREATININE 1.26*  CALCIUM 9.4   GFR: Estimated Creatinine Clearance: 34.3 mL/min (A) (by C-G formula based on SCr of 1.26 mg/dL (H)). Liver Function  Tests: No results for input(s): AST, ALT, ALKPHOS, BILITOT, PROT, ALBUMIN in the last 168 hours. No results for input(s): LIPASE, AMYLASE in the last 168 hours. No results for input(s): AMMONIA in the last 168 hours. Coagulation Profile: No results for input(s): INR, PROTIME in the last 168 hours. Cardiac Enzymes:  Recent Labs Lab 05/15/16 2247  TROPONINI <0.03   BNP (last 3 results) No results for input(s): PROBNP in the last 8760 hours. HbA1C: No results for input(s): HGBA1C in the last 72 hours. CBG: No results for input(s): GLUCAP in the last 168 hours. Lipid Profile: No results for input(s): CHOL, HDL, LDLCALC, TRIG, CHOLHDL, LDLDIRECT in the last 72 hours. Thyroid Function Tests: No results for input(s): TSH, T4TOTAL, FREET4, T3FREE, THYROIDAB in the last 72 hours. Anemia Panel: No results for input(s): VITAMINB12, FOLATE, FERRITIN, TIBC, IRON, RETICCTPCT in the last 72 hours. Urine analysis:    Component Value Date/Time   COLORURINE YELLOW 04/16/2016 1920   APPEARANCEUR HAZY (A) 04/16/2016 1920   LABSPEC 1.006 04/16/2016 1920   PHURINE 6.0 04/16/2016 1920   GLUCOSEU NEGATIVE 04/16/2016 1920   HGBUR SMALL (A) 04/16/2016 1920   BILIRUBINUR NEGATIVE 04/16/2016 1920   KETONESUR NEGATIVE 04/16/2016 1920   PROTEINUR NEGATIVE 04/16/2016 1920   NITRITE POSITIVE (A) 04/16/2016 1920   LEUKOCYTESUR LARGE (A) 04/16/2016 1920    Radiological Exams on Admission: Dg Chest Portable 1 View  Result Date: 05/15/2016 CLINICAL DATA:  Acute onset of shortness of breath. Initial encounter. EXAM: PORTABLE CHEST 1 VIEW COMPARISON:  Chest radiograph performed 12/19/2015 FINDINGS: The lungs are well-aerated. Vascular congestion is noted. Patchy bibasilar airspace opacities raise concern for pulmonary edema. The lung bases are incompletely imaged on this study. No pneumothorax is seen. The cardiomediastinal silhouette is within normal limits. No acute osseous abnormalities are seen.  IMPRESSION: Vascular congestion. Patchy bibasilar airspace opacities raise concern for pulmonary edema. Electronically Signed   By: Roanna Raider M.D.   On: 05/15/2016 23:17    EKG: Independently reviewed.  Assessment/Plan Principal Problem:   Acute exacerbation of chronic obstructive pulmonary disease (COPD) (  HCC) Active Problems:   Essential hypertension   Diabetes mellitus, stable (HCC)    1. COPD exacerbation - 1. COPD pathway 2. Prednisone 3. Albuterol 4. spireva 5. Will continue doxycycline for now, if fever or WBC develops add rocephin but patient breathing seems to be baseline per her and son 2. HTN - continue home meds 3. DM - 1. Appears to be diet controlled 2. CBG checks AC/HS 3. Add SSI if needed while on steroids  DVT prophylaxis: lovenox Code Status: Full - per patient Family Communication: Son at bedside Disposition Plan: Home after admit Consults called: None Admission status: Place in [place], Heywood Iles. DO [facility] Pager [phone]  If 7AM-7PM, please contact day team taking care of patient [email or web address] Password [email or web address]  05/16/2016, 2:40 AM

## 2016-05-16 NOTE — Progress Notes (Signed)
PROGRESS NOTE    Sheila Pierce  RUE:[id number] DOB: [birth year] DOA: 05/15/2016 PCP: [name], MD    Brief Narrative:  Sheila Pierce is a 71 y.o. female with medical history significant of end stage COPD requiring 3L O2 via [place] at baseline.  Looks like she also takes Prednisone 20mg  as needed for breathing at home, and is on Diamox chronically (presumably as respiratory stimulant?).  Patient presents to the ED with severe SOB and wheezing.  Dr. Juanetta Gosling is patients pulmonologist.  Patient is a former smoker (quit several years ago but before that smoked for several decades).  She lives at home with 2 sons.  Patient reports 4 hr history of SOB, neb treatments with no relief.  Productive cough.  No fevers, chills.   Assessment & Plan:   Principal Problem:   Acute exacerbation of chronic obstructive pulmonary disease (COPD) (HCC) Active Problems:   Essential hypertension   COPD exacerbation (HCC)   Diabetes mellitus, stable (HCC)  COPD exacerbation  -COPD pathway - Prednisone 40mg  daily - Albuterol, breo ellipta, duoneb, spiriva - continue doxycycline  HTN  - continue acetazolamide, HCTZ, lisinopril and lopressor  DM  - Appears to be diet controlled - CBG checks AC/HS - Add SSI if needed while on steroids  DVT prophylaxis: lovenox Code Status: Full  Family Communication: no family bedside Disposition Plan: likely home tomorrow if respiratory status improves    Consultants:   None  Procedures:   None  Antimicrobials:   Doxycycline    Subjective: Patient sitting up in bed breathing heavily.  She is breathing with pursed lips.  She is speaking in phrases not sentences.    Objective: Vitals:   05/16/16 0736 05/16/16 0741 05/16/16 1125 05/16/16 1446  BP:    (!) 152/67  Pulse:    (!) 103  Resp:    20  Temp:    98.7 F (37.1 C)  TempSrc:    Oral  SpO2: 95% 100% 96% 95%  Weight:      Height:        Intake/Output Summary (Last 24 hours) at 05/16/16  1641 Last data filed at 05/16/16 1500  Gross per 24 hour  Intake              980 ml  Output                0 ml  Net              980 ml   Filed Weights   05/15/16 2237 05/16/16 0350  Weight: 64.4 kg (142 lb) 64.5 kg (142 lb 3.2 oz)    Examination:  General exam: moderate distress  Respiratory system: minimal air movement of bilateral lower lobes, wheezing in upper lobes bilaterally Cardiovascular system: S1 & S2 heard, RRR. No JVD, murmurs, rubs, gallops or clicks. No pedal edema. Gastrointestinal system: Abdomen is nondistended, soft and nontender. No organomegaly or masses felt. Normal bowel sounds heard. Central nervous system: Alert and oriented. No focal neurological deficits. Extremities: Symmetric 5 x 5 power. Skin: No rashes, lesions or ulcers Psychiatry: Judgement and insight appear normal. Mood & affect appropriate.     Data Reviewed: I have personally reviewed following labs and imaging studies  CBC:  Recent Labs Lab 05/15/16 2247  WBC 6.5  HGB 10.1*  HCT 33.4*  MCV 94.1  PLT 214   Basic Metabolic Panel:  Recent Labs Lab 05/15/16 2247  NA 135  K 3.1*  CL 98*  CO2 29  GLUCOSE 145*  BUN 21*  CREATININE 1.26*  CALCIUM 9.4   GFR: Estimated Creatinine Clearance: 34.3 mL/min (A) (by C-G formula based on SCr of 1.26 mg/dL (H)). Liver Function Tests: No results for input(s): AST, ALT, ALKPHOS, BILITOT, PROT, ALBUMIN in the last 168 hours. No results for input(s): LIPASE, AMYLASE in the last 168 hours. No results for input(s): AMMONIA in the last 168 hours. Coagulation Profile: No results for input(s): INR, PROTIME in the last 168 hours. Cardiac Enzymes:  Recent Labs Lab 05/15/16 2247  TROPONINI <0.03   BNP (last 3 results) No results for input(s): PROBNP in the last 8760 hours. HbA1C: No results for input(s): HGBA1C in the last 72 hours. CBG:  Recent Labs Lab 05/16/16 0600  GLUCAP 166*   Lipid Profile: No results for input(s): CHOL,  HDL, LDLCALC, TRIG, CHOLHDL, LDLDIRECT in the last 72 hours. Thyroid Function Tests: No results for input(s): TSH, T4TOTAL, FREET4, T3FREE, THYROIDAB in the last 72 hours. Anemia Panel: No results for input(s): VITAMINB12, FOLATE, FERRITIN, TIBC, IRON, RETICCTPCT in the last 72 hours. Sepsis Labs:  Recent Labs Lab 05/15/16 2247 05/16/16 0024  LATICACIDVEN 1.6 1.2    Recent Results (from the past 240 hour(s))  Culture, blood (routine x 2)     Status: None (Preliminary result)   Collection Time: 05/16/16 12:22 AM  Result Value Ref Range Status   Specimen Description RIGHT ANTECUBITAL  Final   Special Requests   Final    BOTTLES DRAWN AEROBIC AND ANAEROBIC Blood Culture adequate volume   Culture NO GROWTH < 12 HOURS  Final   Report Status PENDING  Incomplete  Culture, blood (routine x 2)     Status: None (Preliminary result)   Collection Time: 05/16/16 12:34 AM  Result Value Ref Range Status   Specimen Description BLOOD RIGHT ARM  Final   Special Requests   Final    BOTTLES DRAWN AEROBIC ONLY Blood Culture results may not be optimal due to an inadequate volume of blood received in culture bottles   Culture NO GROWTH < 12 HOURS  Final   Report Status PENDING  Incomplete         Radiology Studies: Dg Chest Portable 1 View  Result Date: 05/15/2016 CLINICAL DATA:  Acute onset of shortness of breath. Initial encounter. EXAM: PORTABLE CHEST 1 VIEW COMPARISON:  Chest radiograph performed 12/19/2015 FINDINGS: The lungs are well-aerated. Vascular congestion is noted. Patchy bibasilar airspace opacities raise concern for pulmonary edema. The lung bases are incompletely imaged on this study. No pneumothorax is seen. The cardiomediastinal silhouette is within normal limits. No acute osseous abnormalities are seen. IMPRESSION: Vascular congestion. Patchy bibasilar airspace opacities raise concern for pulmonary edema. Electronically Signed   By: Roanna Raider M.D.   On: 05/15/2016 23:17         Scheduled Meds: . acetaZOLAMIDE  250 mg Oral BID  . aspirin EC  81 mg Oral Daily  . budesonide  0.5 mg Nebulization BID  . busPIRone  15 mg Oral BID  . cholecalciferol  1,000 Units Oral Daily  . enoxaparin (LOVENOX) injection  40 mg Subcutaneous Q24H  . fluticasone furoate-vilanterol  1 puff Inhalation Daily  . hydrochlorothiazide  12.5 mg Oral Daily  . ipratropium-albuterol  3 mL Nebulization Q4H  . lisinopril  10 mg Oral Daily  . mouth rinse  15 mL Mouth Rinse BID  . metoprolol tartrate  25 mg Oral BID  . multivitamin with minerals  1 tablet Oral Daily  .  potassium chloride  20 mEq Oral BID  . predniSONE  40 mg Oral Q breakfast  . tiotropium  18 mcg Inhalation Daily   Continuous Infusions: . doxycycline (VIBRAMYCIN) IV Stopped (05/16/16 1350)     LOS: 0 days    Time spent: 30 minutes    Katrinka Blazing, MD [facility] Pager [phone]  If 7PM-7AM, please contact night-coverage [email or web address] Password [facility] 05/16/2016, 4:41 PM

## 2016-05-17 DIAGNOSIS — J440 Chronic obstructive pulmonary disease with acute lower respiratory infection: Secondary | ICD-10-CM | POA: Diagnosis not present

## 2016-05-17 DIAGNOSIS — I10 Essential (primary) hypertension: Secondary | ICD-10-CM | POA: Diagnosis not present

## 2016-05-17 DIAGNOSIS — J441 Chronic obstructive pulmonary disease with (acute) exacerbation: Secondary | ICD-10-CM | POA: Diagnosis not present

## 2016-05-17 DIAGNOSIS — E119 Type 2 diabetes mellitus without complications: Secondary | ICD-10-CM | POA: Diagnosis not present

## 2016-05-17 DIAGNOSIS — J189 Pneumonia, unspecified organism: Secondary | ICD-10-CM | POA: Diagnosis not present

## 2016-05-17 LAB — GLUCOSE, CAPILLARY
GLUCOSE-CAPILLARY: 81 mg/dL (ref 65–99)
Glucose-Capillary: 106 mg/dL — ABNORMAL HIGH (ref 65–99)

## 2016-05-17 MED ORDER — DOXYCYCLINE HYCLATE 100 MG PO TABS
100.0000 mg | ORAL_TABLET | Freq: Two times a day (BID) | ORAL | Status: DC
  Administered 2016-05-17: 100 mg via ORAL
  Filled 2016-05-17: qty 1

## 2016-05-17 MED ORDER — DOXYCYCLINE HYCLATE 50 MG PO CAPS: 100.0000 mg | ORAL_CAPSULE | Freq: Two times a day (BID) | ORAL | 0 refills | Status: AC

## 2016-05-17 MED ORDER — PREDNISONE 20 MG PO TABS: 40.0000 mg | ORAL_TABLET | Freq: Every day | ORAL | 0 refills | Status: AC

## 2016-05-17 NOTE — Discharge Summary (Signed)
Physician Discharge Summary  Sheila Pierce NWG:[id number] DOB: 1945-11-09 DOA: 05/15/2016  PCP: [name], MD  Admit date: 05/15/2016 Discharge date: 05/17/2016  Admitted From: Home  Disposition:  Home   Recommendations for Outpatient Follow-up:  1. Follow up with PCP in 1-2 weeks 2. Please obtain BMP/CBC in one week 3. Please follow up on the following pending results:  Home Health: No Equipment/Devices: Oxygen at 3L [place]   Discharge Condition: Stable CODE STATUS: Full code Diet recommendation: Heart Healthy  Brief/Interim Summary: Sheila Pierce a 71 y.o.femalewith medical history significant of end stage COPD requiring 3L O2 via [place] at baseline. Looks like she also takes Prednisone 20mg  as needed for breathing at home, and is on Diamox chronically.  Patient presents to the ED with severe SOB and wheezing. Dr. Juanetta Gosling is patients pulmonologist. Patient is a former smoker (quit several years ago but before that smoked for several decades). She lives at home with 2 sons. Patient reports 4 hr history of SOB, neb treatments with no relief. Productive cough. No fevers, chills. Patient was brought in for observation.  She improved and was on home oxygen requirement of 3L on day of discharge.  She was tolerating PO and was discharge with doxycycline and prednisone.  She has an appointment on 4/26 with her PCP.  Discharge Diagnoses:  Principal Problem:   Acute exacerbation of chronic obstructive pulmonary disease (COPD) (HCC) Active Problems:   Essential hypertension   COPD exacerbation (HCC)   Diabetes mellitus, stable (HCC)    Discharge Instructions  Discharge Instructions    Call MD for:  difficulty breathing, headache or visual disturbances    Complete by:  As directed    Call MD for:  extreme fatigue    Complete by:  As directed    Call MD for:  hives    Complete by:  As directed    Call MD for:  persistant dizziness or light-headedness    Complete by:  As  directed    Call MD for:  persistant nausea and vomiting    Complete by:  As directed    Call MD for:  severe uncontrolled pain    Complete by:  As directed    Call MD for:  temperature >100.4    Complete by:  As directed    Diet - low sodium heart healthy    Complete by:  As directed    Discharge instructions    Complete by:  As directed    Keep scheduled appointment with PCP in 3 days Take new prescriptions as prescribed Schedule follow up with Dr. Juanetta Gosling in 2-3 weeks   Increase activity slowly    Complete by:  As directed      Allergies as of 05/17/2016      Reactions   Penicillins Swelling, Other (See Comments)   Reaction:  Unspecified swelling reaction Has patient had a PCN reaction causing immediate rash, facial/tongue/throat swelling, SOB or lightheadedness with hypotension: Yes Has patient had a PCN reaction causing severe rash involving mucus membranes or skin necrosis: No Has patient had a PCN reaction that required hospitalization No Has patient had a PCN reaction occurring within the last 10 years: Yes If all of the above answers are "NO", then may proceed with Cephalosporin use.   Sulfa Antibiotics Hives, Other (See Comments)   Reaction:  Hallucinations   Codeine Nausea Only   Hydrocodone Nausea Only      Medication List    TAKE these medications  acetaZOLAMIDE 250 MG tablet Commonly known as:  DIAMOX Take 250 mg by mouth 2 (two) times daily.   albuterol 108 (90 Base) MCG/ACT inhaler Commonly known as:  VENTOLIN HFA Inhale 2 puffs into the lungs every 6 (six) hours as needed for wheezing or shortness of breath.   aspirin EC 81 MG tablet Take 81 mg by mouth daily.   BREO ELLIPTA 100-25 MCG/INH Aepb Generic drug:  fluticasone furoate-vilanterol Inhale 1 puff into the lungs daily.   budesonide 0.5 MG/2ML nebulizer solution Commonly known as:  PULMICORT Take 0.5 mg by nebulization 2 (two) times daily.   busPIRone 15 MG tablet Commonly known as:   BUSPAR TAKE 1 TABLET BY MOUTH TWICE DAILY   cholecalciferol 1000 units tablet Commonly known as:  VITAMIN D Take 1,000 Units by mouth daily.   ipratropium-albuterol 0.5-2.5 (3) MG/3ML Soln Commonly known as:  DUONEB Take 3 mLs by nebulization every 4 (four) hours.   lisinopril-hydrochlorothiazide 10-12.5 MG tablet Commonly known as:  PRINZIDE,ZESTORETIC Take 1 tablet by mouth daily.   metoprolol tartrate 25 MG tablet Commonly known as:  LOPRESSOR Take 1 tablet (25 mg total) by mouth 2 (two) times daily.   multivitamin with minerals Tabs tablet Take 1 tablet by mouth daily.   potassium chloride 10 MEQ tablet Commonly known as:  K-DUR Take 1 tablet (10 mEq total) by mouth daily.   predniSONE 20 MG tablet Commonly known as:  DELTASONE Take 2 tablets (40 mg total) by mouth daily with breakfast. Start taking on:  05/18/2016 What changed:  how much to take  when to take this  reasons to take this   prochlorperazine 10 MG tablet Commonly known as:  COMPAZINE Take 1 tablet (10 mg total) by mouth 2 (two) times daily as needed for nausea or vomiting.   tiotropium 18 MCG inhalation capsule Commonly known as:  SPIRIVA Place 18 mcg into inhaler and inhale daily.   traMADol 50 MG tablet Commonly known as:  ULTRAM Take 1 tablet (50 mg total) by mouth every 6 (six) hours as needed for moderate pain or severe pain.       Allergies  Allergen Reactions  . Penicillins Swelling and Other (See Comments)    Reaction:  Unspecified swelling reaction Has patient had a PCN reaction causing immediate rash, facial/tongue/throat swelling, SOB or lightheadedness with hypotension: Yes Has patient had a PCN reaction causing severe rash involving mucus membranes or skin necrosis: No Has patient had a PCN reaction that required hospitalization No Has patient had a PCN reaction occurring within the last 10 years: Yes If all of the above answers are "NO", then may proceed with Cephalosporin  use.  . Sulfa Antibiotics Hives and Other (See Comments)    Reaction:  Hallucinations   . Codeine Nausea Only  . Hydrocodone Nausea Only    Consultations:  None   Procedures/Studies: Dg Chest Portable 1 View  Result Date: 05/15/2016 CLINICAL DATA:  Acute onset of shortness of breath. Initial encounter. EXAM: PORTABLE CHEST 1 VIEW COMPARISON:  Chest radiograph performed 12/19/2015 FINDINGS: The lungs are well-aerated. Vascular congestion is noted. Patchy bibasilar airspace opacities raise concern for pulmonary edema. The lung bases are incompletely imaged on this study. No pneumothorax is seen. The cardiomediastinal silhouette is within normal limits. No acute osseous abnormalities are seen. IMPRESSION: Vascular congestion. Patchy bibasilar airspace opacities raise concern for pulmonary edema. Electronically Signed   By: Roanna Raider M.D.   On: 05/15/2016 23:17       Subjective:  Patient reports that her breathing is improved from yesterday. She has oxygen at home.  She understands that she needs prednisone and antibiotics after discharge.  Discharge Exam: Vitals:   05/16/16 2245 05/17/16 0330  BP: 126/75 (!) 144/67  Pulse: (!) 108 86  Resp: 18 18  Temp: 97.7 F (36.5 C) 98.2 F (36.8 C)   Vitals:   05/16/16 2245 05/16/16 2255 05/17/16 0330 05/17/16 0731  BP: 126/75  (!) 144/67   Pulse: (!) 108  86   Resp: 18  18   Temp: 97.7 F (36.5 C)  98.2 F (36.8 C)   TempSrc: Oral  Oral   SpO2: 94% 98% 97% 99%  Weight:      Height:        General: Pt is alert, awake, not in acute distress Cardiovascular: RRR, S1/S2 +, no rubs, no gallops Respiratory: poor air movement in lower lung fields bilaterally, wheezing and scattered crackles in middle and upper lung fields bilaterally Abdominal: Soft, NT, ND, bowel sounds + Extremities: no edema, no cyanosis    The results of significant diagnostics from this hospitalization (including imaging, microbiology, ancillary and  laboratory) are listed below for reference.     Microbiology: Recent Results (from the past 240 hour(s))  Culture, blood (routine x 2)     Status: None (Preliminary result)   Collection Time: 05/16/16 12:22 AM  Result Value Ref Range Status   Specimen Description RIGHT ANTECUBITAL  Final   Special Requests   Final    BOTTLES DRAWN AEROBIC AND ANAEROBIC Blood Culture adequate volume   Culture NO GROWTH 1 DAY  Final   Report Status PENDING  Incomplete  Culture, blood (routine x 2)     Status: None (Preliminary result)   Collection Time: 05/16/16 12:34 AM  Result Value Ref Range Status   Specimen Description BLOOD RIGHT ARM  Final   Special Requests   Final    BOTTLES DRAWN AEROBIC ONLY Blood Culture results may not be optimal due to an inadequate volume of blood received in culture bottles   Culture NO GROWTH 1 DAY  Final   Report Status PENDING  Incomplete     Labs: BNP (last 3 results)  Recent Labs  12/19/15 1225 05/15/16 2247  BNP 25.0 19.0   Basic Metabolic Panel:  Recent Labs Lab 05/15/16 2247  NA 135  K 3.1*  CL 98*  CO2 29  GLUCOSE 145*  BUN 21*  CREATININE 1.26*  CALCIUM 9.4   Liver Function Tests: No results for input(s): AST, ALT, ALKPHOS, BILITOT, PROT, ALBUMIN in the last 168 hours. No results for input(s): LIPASE, AMYLASE in the last 168 hours. No results for input(s): AMMONIA in the last 168 hours. CBC:  Recent Labs Lab 05/15/16 2247  WBC 6.5  HGB 10.1*  HCT 33.4*  MCV 94.1  PLT 214   Cardiac Enzymes:  Recent Labs Lab 05/15/16 2247  TROPONINI <0.03   BNP: Invalid input(s): POCBNP CBG:  Recent Labs Lab 05/16/16 0600 05/16/16 1653 05/16/16 2150 05/17/16 0732  GLUCAP 166* 142* 130* 81   D-Dimer No results for input(s): DDIMER in the last 72 hours. Hgb A1c No results for input(s): HGBA1C in the last 72 hours. Lipid Profile No results for input(s): CHOL, HDL, LDLCALC, TRIG, CHOLHDL, LDLDIRECT in the last 72 hours. Thyroid  function studies No results for input(s): TSH, T4TOTAL, T3FREE, THYROIDAB in the last 72 hours.  Invalid input(s): FREET3 Anemia work up No results for input(s): VITAMINB12, FOLATE, FERRITIN, TIBC,  IRON, RETICCTPCT in the last 72 hours. Urinalysis    Component Value Date/Time   COLORURINE YELLOW 04/16/2016 1920   APPEARANCEUR HAZY (A) 04/16/2016 1920   LABSPEC 1.006 04/16/2016 1920   PHURINE 6.0 04/16/2016 1920   GLUCOSEU NEGATIVE 04/16/2016 1920   HGBUR SMALL (A) 04/16/2016 1920   BILIRUBINUR NEGATIVE 04/16/2016 1920   KETONESUR NEGATIVE 04/16/2016 1920   PROTEINUR NEGATIVE 04/16/2016 1920   NITRITE POSITIVE (A) 04/16/2016 1920   LEUKOCYTESUR LARGE (A) 04/16/2016 1920   Sepsis Labs Invalid input(s): PROCALCITONIN,  WBC,  LACTICIDVEN Microbiology Recent Results (from the past 240 hour(s))  Culture, blood (routine x 2)     Status: None (Preliminary result)   Collection Time: 05/16/16 12:22 AM  Result Value Ref Range Status   Specimen Description RIGHT ANTECUBITAL  Final   Special Requests   Final    BOTTLES DRAWN AEROBIC AND ANAEROBIC Blood Culture adequate volume   Culture NO GROWTH 1 DAY  Final   Report Status PENDING  Incomplete  Culture, blood (routine x 2)     Status: None (Preliminary result)   Collection Time: 05/16/16 12:34 AM  Result Value Ref Range Status   Specimen Description BLOOD RIGHT ARM  Final   Special Requests   Final    BOTTLES DRAWN AEROBIC ONLY Blood Culture results may not be optimal due to an inadequate volume of blood received in culture bottles   Culture NO GROWTH 1 DAY  Final   Report Status PENDING  Incomplete     Time coordinating discharge: 30 minutes  SIGNED:   Katrinka Blazing, MD  [facility] 05/17/2016, 10:59 AM Pager [phone] If 7PM-7AM, please contact night-coverage [email or web address] Password [email or web address]

## 2016-05-17 NOTE — Care Management Note (Signed)
Case Management Note  Patient Details  Name: Sheila Pierce MRN: [id number] Date of Birth: September 12, 1945  Subjective/Objective:                  Pt admitted with COPD exacerbation. She is from home, lives with her two sons. She has PCP, transportation to appointments and insurance with drug coverage. She has neb machine and oxygen through [employer] and will need port oxygen for transport home.  Pt has PCS aid 3 hrs a day 5 days a week. Pt's son will pick up and transport home. No further needs communicated.  Action/Plan: [facility] contacted and they will deliver port tank to hospital for pt to transport home with. Pt discharging home today with resumption of previous care giving arrangements.   Expected Discharge Date:  05/17/16               Expected Discharge Plan:  Home/Self Care  In-House Referral:  NA  Discharge planning Services  CM Consult  Post Acute Care Choice:  NA Choice offered to:  NA  Status of Service:  Completed, signed off Malcolm Metro, RN 05/17/2016, 11:31 AM

## 2016-05-17 NOTE — Care Management Obs Status (Signed)
MEDICARE OBSERVATION STATUS NOTIFICATION   Patient Details  Name: Sheila Pierce MRN: [id number] Date of Birth: 1946-01-19   Medicare Observation Status Notification Given:  Yes    Malcolm Metro, RN 05/17/2016, 11:31 AM

## 2016-05-17 NOTE — Progress Notes (Signed)
Patient is to be discharged home and in stable condition. Patient's son states he will pick her up from the hospital later this afternoon. Patient verbalizes understanding of changes to medication regimen. Patient will be escorted out by staff upon son's arrival.  Quita Skye, RN

## 2016-05-18 DIAGNOSIS — J440 Chronic obstructive pulmonary disease with acute lower respiratory infection: Secondary | ICD-10-CM | POA: Diagnosis not present

## 2016-05-19 DIAGNOSIS — J440 Chronic obstructive pulmonary disease with acute lower respiratory infection: Secondary | ICD-10-CM | POA: Diagnosis not present

## 2016-05-20 ENCOUNTER — Encounter: Payer: Self-pay | Admitting: Family Medicine

## 2016-05-20 ENCOUNTER — Ambulatory Visit (INDEPENDENT_AMBULATORY_CARE_PROVIDER_SITE_OTHER): Payer: Medicare HMO | Admitting: Family Medicine

## 2016-05-20 VITALS — BP 146/70 | HR 96 | Temp 97.6°F | Resp 24 | Ht 60.0 in | Wt 143.0 lb

## 2016-05-20 DIAGNOSIS — J440 Chronic obstructive pulmonary disease with acute lower respiratory infection: Secondary | ICD-10-CM | POA: Diagnosis not present

## 2016-05-20 DIAGNOSIS — J441 Chronic obstructive pulmonary disease with (acute) exacerbation: Secondary | ICD-10-CM | POA: Diagnosis not present

## 2016-05-20 DIAGNOSIS — Z09 Encounter for follow-up examination after completed treatment for conditions other than malignant neoplasm: Secondary | ICD-10-CM | POA: Diagnosis not present

## 2016-05-20 MED ORDER — BRIEFS OVERNIGHT MEDIUM MISC: 11 refills | Status: DC

## 2016-05-20 NOTE — Patient Instructions (Signed)
Continue current medicine and care plan  See me in 3 months  Call sooner for problems

## 2016-05-20 NOTE — Progress Notes (Signed)
Chief Complaint  Patient presents with  . Transitions Of Care   Patient is here for hospital discharge follow-up. She was hospitalized on 05/15/2016 for exacerbation of COPD, community-acquired pneumonia. She was discharged on 05/17/2016. She is end-stage COPD oxygen dependent with multiple hospitalizations for exacerbation. She had no change in her chronic medications. She is taking her antibiotics and prednisone. She is feeling well. No fever or chills. Her weight is stable. No falls. She spends much of her day in a hospital bed. She is cared for by her son and caregiver. I ordered her incontinence supplies again today. I also gave her a handicap parking. I reordered a CMC and BMP as recommended by the discharge summary. The patient is instructed to the laboratory after leaving.  Patient Active Problem List   Diagnosis Date Noted  . COPD (chronic obstructive pulmonary disease) (HCC) 12/19/2015  . Acute on chronic respiratory failure with hypoxia (HCC) 12/19/2015  . Hypercholesteremia 12/16/2015  . Osteoporosis 12/01/2015  . Colonoscopy refused 12/01/2015  . Macular degeneration, age related, nonexudative 11/26/2015  . Cataract incipient, senile, bilateral 11/26/2015  . Onychomycosis of toenail 11/11/2015  . Aortic atherosclerosis (HCC) 11/11/2015  . Abnormal thyroid function test 11/06/2015  . Diabetes mellitus, stable (HCC) 11/05/2015  . COPD exacerbation (HCC) 11/04/2015  . AKI (acute kidney injury) (HCC) 11/03/2015  . Normocytic anemia 11/03/2015  . Anxiety 11/03/2015  . Acute exacerbation of chronic obstructive pulmonary disease (COPD) (HCC) 09/19/2012  . CAD (coronary artery disease) 11/24/2011  . Essential hypertension   . QT prolongation   . MI, acute, non ST segment elevation (HCC) 10/05/2011    Outpatient Encounter Prescriptions as of 05/20/2016  Medication Sig  . acetaZOLAMIDE (DIAMOX) 250 MG tablet Take 250 mg by mouth 2 (two) times daily.  Marland Kitchen albuterol (VENTOLIN  HFA) 108 (90 Base) MCG/ACT inhaler Inhale 2 puffs into the lungs every 6 (six) hours as needed for wheezing or shortness of breath.  Marland Kitchen aspirin EC 81 MG tablet Take 81 mg by mouth daily.  Marland Kitchen BREO ELLIPTA 100-25 MCG/INH AEPB Inhale 1 puff into the lungs daily.  . budesonide (PULMICORT) 0.5 MG/2ML nebulizer solution Take 0.5 mg by nebulization 2 (two) times daily.  . busPIRone (BUSPAR) 15 MG tablet TAKE 1 TABLET BY MOUTH TWICE DAILY  . cholecalciferol (VITAMIN D) 1000 units tablet Take 1,000 Units by mouth daily.  Marland Kitchen doxycycline (VIBRAMYCIN) 50 MG capsule Take 2 capsules (100 mg total) by mouth 2 (two) times daily.  Marland Kitchen ipratropium-albuterol (DUONEB) 0.5-2.5 (3) MG/3ML SOLN Take 3 mLs by nebulization every 4 (four) hours.  Marland Kitchen lisinopril-hydrochlorothiazide (PRINZIDE,ZESTORETIC) 10-12.5 MG tablet Take 1 tablet by mouth daily.  . metoprolol tartrate (LOPRESSOR) 25 MG tablet Take 1 tablet (25 mg total) by mouth 2 (two) times daily.  . Multiple Vitamin (MULTIVITAMIN WITH MINERALS) TABS tablet Take 1 tablet by mouth daily.  . potassium chloride (K-DUR) 10 MEQ tablet Take 1 tablet (10 mEq total) by mouth daily.  . predniSONE (DELTASONE) 20 MG tablet Take 2 tablets (40 mg total) by mouth daily with breakfast.  . prochlorperazine (COMPAZINE) 10 MG tablet Take 1 tablet (10 mg total) by mouth 2 (two) times daily as needed for nausea or vomiting.  . tiotropium (SPIRIVA) 18 MCG inhalation capsule Place 18 mcg into inhaler and inhale daily.  . traMADol (ULTRAM) 50 MG tablet Take 1 tablet (50 mg total) by mouth every 6 (six) hours as needed for moderate pain or severe pain.  . Incontinence Supply Disposable (BRIEFS  OVERNIGHT MEDIUM) MISC Four daily, as needed   No facility-administered encounter medications on file as of 05/20/2016.     Allergies  Allergen Reactions  . Penicillins Swelling and Other (See Comments)    Reaction:  Unspecified swelling reaction Has patient had a PCN reaction causing immediate rash,  facial/tongue/throat swelling, SOB or lightheadedness with hypotension: Yes Has patient had a PCN reaction causing severe rash involving mucus membranes or skin necrosis: No Has patient had a PCN reaction that required hospitalization No Has patient had a PCN reaction occurring within the last 10 years: Yes If all of the above answers are "NO", then may proceed with Cephalosporin use.  . Sulfa Antibiotics Hives and Other (See Comments)    Reaction:  Hallucinations   . Codeine Nausea Only  . Hydrocodone Nausea Only    Review of Systems  Constitutional: Negative for activity change, appetite change and unexpected weight change.  HENT: Negative for congestion, dental problem, postnasal drip and rhinorrhea.   Eyes: Negative for redness and visual disturbance.  Respiratory: Positive for cough, shortness of breath and wheezing.   Cardiovascular: Negative for chest pain, palpitations and leg swelling.  Gastrointestinal: Negative for abdominal pain, constipation and diarrhea.  Genitourinary: Negative for difficulty urinating and frequency.  Musculoskeletal: Negative for arthralgias and back pain.  Neurological: Negative for dizziness and headaches.  Psychiatric/Behavioral: Positive for dysphoric mood. Negative for sleep disturbance. The patient is not nervous/anxious.        At times, lonely    BP (!) 146/70 (BP Location: Left Arm, Patient Position: Sitting, Cuff Size: Normal)   Pulse 96   Temp 97.6 F (36.4 C) (Temporal)   Resp (!) 24   Ht 5' (1.524 m)   Wt 143 lb (64.9 kg)   SpO2 97% Comment: o2 3 lpm via n/c  BMI 27.93 kg/m   Physical Exam  Constitutional: She is oriented to person, place, and time. She appears well-developed and well-nourished.  Short of breath. In wheelchair. Oxygen dependent. Appears fatigued.  HENT:  Head: Normocephalic and atraumatic.  Right Ear: External ear normal.  Left Ear: External ear normal.  Mouth/Throat: Oropharynx is clear and moist.    Edentulous. Mucous membranes dry  Eyes: Conjunctivae are normal. Pupils are equal, round, and reactive to light.  Neck: Normal range of motion. Neck supple. No thyromegaly present.  Cardiovascular: Normal rate, regular rhythm and normal heart sounds.   Pulmonary/Chest: She has no wheezes. She has no rales.  Decreased breath sounds. Tachypnea. No wheeze. Faint rales in bases    Abdominal: Soft. Bowel sounds are normal. There is no tenderness.  No CVAT or abdominal tenderness  Musculoskeletal: Normal range of motion. She exhibits no edema.  Lymphadenopathy:    She has no cervical adenopathy.  Neurological: She is alert and oriented to person, place, and time.  Skin: Skin is warm and dry.  Psychiatric: Her behavior is normal.  Stable mood. Normal cognition. Smiling today    ASSESSMENT/PLAN:  1. COPD with exacerbation [facility]) Discussed a COPD care plan when patient starts feeling sick. She feels like she gets sick very suddenly and is unable to manage at home, that is why they called 911.  2. Hospital discharge follow-up Labs ordered   Patient Instructions  Continue current medicine and care plan  See me in 3 months  Call sooner for problems   [name], MD

## 2016-05-21 DIAGNOSIS — J440 Chronic obstructive pulmonary disease with acute lower respiratory infection: Secondary | ICD-10-CM | POA: Diagnosis not present

## 2016-05-21 LAB — CULTURE, BLOOD (ROUTINE X 2)
CULTURE: NO GROWTH
Culture: NO GROWTH
Special Requests: ADEQUATE

## 2016-05-22 DIAGNOSIS — J440 Chronic obstructive pulmonary disease with acute lower respiratory infection: Secondary | ICD-10-CM | POA: Diagnosis not present

## 2016-05-23 DIAGNOSIS — J440 Chronic obstructive pulmonary disease with acute lower respiratory infection: Secondary | ICD-10-CM | POA: Diagnosis not present

## 2016-05-24 DIAGNOSIS — M818 Other osteoporosis without current pathological fracture: Secondary | ICD-10-CM | POA: Diagnosis not present

## 2016-05-24 DIAGNOSIS — R32 Unspecified urinary incontinence: Secondary | ICD-10-CM | POA: Diagnosis not present

## 2016-05-24 DIAGNOSIS — J438 Other emphysema: Secondary | ICD-10-CM | POA: Diagnosis not present

## 2016-05-24 DIAGNOSIS — J449 Chronic obstructive pulmonary disease, unspecified: Secondary | ICD-10-CM | POA: Diagnosis not present

## 2016-05-28 ENCOUNTER — Other Ambulatory Visit: Payer: Self-pay | Admitting: Family Medicine

## 2016-05-28 DIAGNOSIS — R69 Illness, unspecified: Secondary | ICD-10-CM | POA: Diagnosis not present

## 2016-05-31 ENCOUNTER — Other Ambulatory Visit: Payer: Self-pay

## 2016-05-31 NOTE — Telephone Encounter (Signed)
[phone] 

## 2016-06-02 ENCOUNTER — Encounter: Payer: Self-pay | Admitting: Podiatry

## 2016-06-02 ENCOUNTER — Ambulatory Visit (INDEPENDENT_AMBULATORY_CARE_PROVIDER_SITE_OTHER): Payer: Medicare HMO | Admitting: Family Medicine

## 2016-06-02 ENCOUNTER — Encounter: Payer: Self-pay | Admitting: Family Medicine

## 2016-06-02 ENCOUNTER — Ambulatory Visit (INDEPENDENT_AMBULATORY_CARE_PROVIDER_SITE_OTHER): Payer: Medicare HMO | Admitting: Podiatry

## 2016-06-02 VITALS — BP 120/60 | HR 100 | Temp 98.8°F | Resp 28 | Ht 60.0 in | Wt 140.0 lb

## 2016-06-02 DIAGNOSIS — M79674 Pain in right toe(s): Secondary | ICD-10-CM | POA: Diagnosis not present

## 2016-06-02 DIAGNOSIS — J449 Chronic obstructive pulmonary disease, unspecified: Secondary | ICD-10-CM | POA: Diagnosis not present

## 2016-06-02 DIAGNOSIS — R0989 Other specified symptoms and signs involving the circulatory and respiratory systems: Secondary | ICD-10-CM

## 2016-06-02 DIAGNOSIS — M79675 Pain in left toe(s): Secondary | ICD-10-CM | POA: Diagnosis not present

## 2016-06-02 DIAGNOSIS — B351 Tinea unguium: Secondary | ICD-10-CM | POA: Diagnosis not present

## 2016-06-02 DIAGNOSIS — L989 Disorder of the skin and subcutaneous tissue, unspecified: Secondary | ICD-10-CM

## 2016-06-02 DIAGNOSIS — E119 Type 2 diabetes mellitus without complications: Secondary | ICD-10-CM | POA: Diagnosis not present

## 2016-06-02 DIAGNOSIS — L820 Inflamed seborrheic keratosis: Secondary | ICD-10-CM | POA: Diagnosis not present

## 2016-06-02 MED ORDER — UNABLE TO FIND: 0 refills | Status: DC

## 2016-06-02 NOTE — Progress Notes (Signed)
Patient ID: Sheila Pierce, female   DOB: 01/19/1946, 71 y.o.   MRN: [id number]     Subjective: Patient presents for scheduled visit for debridement of symptomatic mycotic toenails. The patient's son and representative from social service are present in the treatment room today Patient is a known diabetic without history of foot ulceration or claudication or amputation Patient is a former smoker Patient's son stated that she is recently discontinue diabetic medication   Objective: Patient transfers from wheelchair to treatment table  Patient appears orientated 3  Vascular: No peripheral edema or calf tenderness bilaterally DP pulses 1/4 bilaterally PT pulses 2/4 bilaterally Capillary reflex delay bilaterally  Neurological: Sensation to 10 g monofilament wire intact 4/5 bilaterally Vibratory sensation reactive bilaterally Ankle reflex equal reactive bilaterally  Dermatological: Atrophic skin with absent hair growth bilaterally No open skin lesions bilaterally The toenails are elongated, brittle, deformed, discolored and tender to direct palpation 6-10  Musculoskeletal: Patient transfer from wheelchair to treatment chair Hammertoes fifth bilaterally (overlapping fifth toes) Living second third toes bilaterally Manual motor testing dorsi flexion, plantar flexion, inversion, eversion    Assessment: Diabetic with decreased pedal pulses symptomatic onychomycoses 6-10  Plan: The toenails 6-10 were debrided mechanically analytic without any bleeding  Reappoint 3 months 

## 2016-06-02 NOTE — Patient Instructions (Signed)

## 2016-06-02 NOTE — Progress Notes (Signed)
Avian is her to have skin lesion removed from her forearm She picks at it a lot.  It is growing.  On examination I cannot tell if it is scarring/granuloma from repeated trauma or a skin cancer.  It warrants removal.  BP 120/60 (BP Location: Right Arm, Patient Position: Sitting, Cuff Size: Normal)   Pulse 100   Temp 98.8 F (37.1 C) (Temporal)   Resp (!) 28   Ht 5' (1.524 m)   Wt 140 lb (63.5 kg)   SpO2 100%   BMI 27.34 kg/m   PROCEDURE:  Time out Consent Area cleansed with betadine Second wash with alcohol anesthestized with 1% lidocaine She briefly became pale, so we put procedure on hold for 5 minutes.  She quickly recovered and we were able to continue.  Lesion identified.  Elliptical incision around the borders.  dissected down to sub cutaneous space and removed with scissors. Sent to pathology. Wound closed with 3 interrupted 5-0 ethilon sutures, closed nicely. Area washed, dried and dressing placed.  Wound care discussed Nurse to check in a couple of days Sutures out in 10 days

## 2016-06-02 NOTE — Patient Instructions (Signed)
Keep area clean and dry Change dressing daily The nurse can check the dressing or wound any time Watch for infection Stitches out in 7 -10 days with me Call for problems

## 2016-06-09 ENCOUNTER — Ambulatory Visit (INDEPENDENT_AMBULATORY_CARE_PROVIDER_SITE_OTHER): Payer: Medicare HMO

## 2016-06-09 DIAGNOSIS — Z4889 Encounter for other specified surgical aftercare: Secondary | ICD-10-CM | POA: Diagnosis not present

## 2016-06-09 NOTE — Progress Notes (Signed)
Karim in for suture removal - 3 sutures removed LFA per v/o from dr Delton See. Pt tolerated well. No reddness, swelling pain noted at site.

## 2016-06-10 DIAGNOSIS — J9610 Chronic respiratory failure, unspecified whether with hypoxia or hypercapnia: Secondary | ICD-10-CM | POA: Diagnosis not present

## 2016-06-10 DIAGNOSIS — J449 Chronic obstructive pulmonary disease, unspecified: Secondary | ICD-10-CM | POA: Diagnosis not present

## 2016-06-11 ENCOUNTER — Ambulatory Visit: Payer: Self-pay | Admitting: Family Medicine

## 2016-06-11 DIAGNOSIS — J449 Chronic obstructive pulmonary disease, unspecified: Secondary | ICD-10-CM | POA: Diagnosis not present

## 2016-06-11 DIAGNOSIS — R32 Unspecified urinary incontinence: Secondary | ICD-10-CM | POA: Diagnosis not present

## 2016-06-11 DIAGNOSIS — M818 Other osteoporosis without current pathological fracture: Secondary | ICD-10-CM | POA: Diagnosis not present

## 2016-06-11 DIAGNOSIS — J438 Other emphysema: Secondary | ICD-10-CM | POA: Diagnosis not present

## 2016-06-28 DIAGNOSIS — R69 Illness, unspecified: Secondary | ICD-10-CM | POA: Diagnosis not present

## 2016-06-29 ENCOUNTER — Telehealth: Payer: Self-pay | Admitting: Family Medicine

## 2016-06-29 NOTE — Telephone Encounter (Signed)
Arlean Hopping called in says she is patients nurse, states that everytime patient eats anything she throws up, this has been going on a few weeks. Cb#: [phone]

## 2016-06-29 NOTE — Telephone Encounter (Signed)
Called and spoke to [place], states pt is throwing up shortly after she eats. States she is not choking on her food. states she does not appear to have lost weight, but the have no scales at home.

## 2016-06-29 NOTE — Telephone Encounter (Signed)
We can give Rx for zofran to try.  This can be taken a couple of times a day.  May need GI consult.  Have family make sure she is drinking , urinating and moving bowels regularly.  Eat small amounts.  Call for problems

## 2016-06-30 MED ORDER — ONDANSETRON HCL 4 MG PO TABS: 4.0000 mg | ORAL_TABLET | Freq: Three times a day (TID) | ORAL | 0 refills | Status: DC | PRN

## 2016-06-30 NOTE — Telephone Encounter (Signed)
Called and spoke to son, aware rx sent to [facility], and of [facility] advice.

## 2016-07-11 DIAGNOSIS — J449 Chronic obstructive pulmonary disease, unspecified: Secondary | ICD-10-CM | POA: Diagnosis not present

## 2016-07-11 DIAGNOSIS — J9610 Chronic respiratory failure, unspecified whether with hypoxia or hypercapnia: Secondary | ICD-10-CM | POA: Diagnosis not present

## 2016-07-12 DIAGNOSIS — M818 Other osteoporosis without current pathological fracture: Secondary | ICD-10-CM | POA: Diagnosis not present

## 2016-07-12 DIAGNOSIS — R32 Unspecified urinary incontinence: Secondary | ICD-10-CM | POA: Diagnosis not present

## 2016-07-12 DIAGNOSIS — J438 Other emphysema: Secondary | ICD-10-CM | POA: Diagnosis not present

## 2016-07-12 DIAGNOSIS — J449 Chronic obstructive pulmonary disease, unspecified: Secondary | ICD-10-CM | POA: Diagnosis not present

## 2016-07-22 DIAGNOSIS — R32 Unspecified urinary incontinence: Secondary | ICD-10-CM | POA: Diagnosis not present

## 2016-07-22 DIAGNOSIS — J449 Chronic obstructive pulmonary disease, unspecified: Secondary | ICD-10-CM | POA: Diagnosis not present

## 2016-07-22 DIAGNOSIS — M818 Other osteoporosis without current pathological fracture: Secondary | ICD-10-CM | POA: Diagnosis not present

## 2016-07-22 DIAGNOSIS — J438 Other emphysema: Secondary | ICD-10-CM | POA: Diagnosis not present

## 2016-07-25 DIAGNOSIS — R0682 Tachypnea, not elsewhere classified: Secondary | ICD-10-CM | POA: Diagnosis not present

## 2016-07-25 DIAGNOSIS — J441 Chronic obstructive pulmonary disease with (acute) exacerbation: Secondary | ICD-10-CM | POA: Diagnosis not present

## 2016-07-25 DIAGNOSIS — Z720 Tobacco use: Secondary | ICD-10-CM | POA: Diagnosis not present

## 2016-07-25 DIAGNOSIS — R0602 Shortness of breath: Secondary | ICD-10-CM | POA: Diagnosis not present

## 2016-07-26 DIAGNOSIS — R69 Illness, unspecified: Secondary | ICD-10-CM | POA: Diagnosis not present

## 2016-07-26 DIAGNOSIS — R32 Unspecified urinary incontinence: Secondary | ICD-10-CM | POA: Diagnosis not present

## 2016-07-26 DIAGNOSIS — I10 Essential (primary) hypertension: Secondary | ICD-10-CM | POA: Diagnosis not present

## 2016-07-26 DIAGNOSIS — E871 Hypo-osmolality and hyponatremia: Secondary | ICD-10-CM | POA: Diagnosis not present

## 2016-07-26 DIAGNOSIS — F17200 Nicotine dependence, unspecified, uncomplicated: Secondary | ICD-10-CM | POA: Diagnosis not present

## 2016-07-26 DIAGNOSIS — D649 Anemia, unspecified: Secondary | ICD-10-CM | POA: Diagnosis not present

## 2016-07-26 DIAGNOSIS — M818 Other osteoporosis without current pathological fracture: Secondary | ICD-10-CM | POA: Diagnosis not present

## 2016-07-26 DIAGNOSIS — J9601 Acute respiratory failure with hypoxia: Secondary | ICD-10-CM | POA: Diagnosis not present

## 2016-07-26 DIAGNOSIS — Z825 Family history of asthma and other chronic lower respiratory diseases: Secondary | ICD-10-CM | POA: Diagnosis not present

## 2016-07-26 DIAGNOSIS — J438 Other emphysema: Secondary | ICD-10-CM | POA: Diagnosis not present

## 2016-07-26 DIAGNOSIS — J449 Chronic obstructive pulmonary disease, unspecified: Secondary | ICD-10-CM | POA: Diagnosis not present

## 2016-07-26 DIAGNOSIS — J441 Chronic obstructive pulmonary disease with (acute) exacerbation: Secondary | ICD-10-CM | POA: Diagnosis not present

## 2016-07-26 DIAGNOSIS — Z720 Tobacco use: Secondary | ICD-10-CM | POA: Diagnosis not present

## 2016-07-26 DIAGNOSIS — R0602 Shortness of breath: Secondary | ICD-10-CM | POA: Diagnosis not present

## 2016-07-26 DIAGNOSIS — J9622 Acute and chronic respiratory failure with hypercapnia: Secondary | ICD-10-CM | POA: Diagnosis not present

## 2016-07-26 DIAGNOSIS — Z886 Allergy status to analgesic agent status: Secondary | ICD-10-CM | POA: Diagnosis not present

## 2016-07-26 DIAGNOSIS — R000 Tachycardia, unspecified: Secondary | ICD-10-CM | POA: Diagnosis not present

## 2016-07-29 ENCOUNTER — Other Ambulatory Visit: Payer: Self-pay | Admitting: Family Medicine

## 2016-07-29 NOTE — Telephone Encounter (Signed)
Seen [phone] 

## 2016-08-10 DIAGNOSIS — J9610 Chronic respiratory failure, unspecified whether with hypoxia or hypercapnia: Secondary | ICD-10-CM | POA: Diagnosis not present

## 2016-08-10 DIAGNOSIS — J449 Chronic obstructive pulmonary disease, unspecified: Secondary | ICD-10-CM | POA: Diagnosis not present

## 2016-08-13 DIAGNOSIS — Z7982 Long term (current) use of aspirin: Secondary | ICD-10-CM | POA: Diagnosis not present

## 2016-08-13 DIAGNOSIS — J441 Chronic obstructive pulmonary disease with (acute) exacerbation: Secondary | ICD-10-CM | POA: Diagnosis not present

## 2016-08-13 DIAGNOSIS — I10 Essential (primary) hypertension: Secondary | ICD-10-CM | POA: Diagnosis not present

## 2016-08-13 DIAGNOSIS — R69 Illness, unspecified: Secondary | ICD-10-CM | POA: Diagnosis not present

## 2016-08-13 DIAGNOSIS — R0602 Shortness of breath: Secondary | ICD-10-CM | POA: Diagnosis not present

## 2016-08-13 DIAGNOSIS — Z9981 Dependence on supplemental oxygen: Secondary | ICD-10-CM | POA: Diagnosis not present

## 2016-08-13 DIAGNOSIS — Z87891 Personal history of nicotine dependence: Secondary | ICD-10-CM | POA: Diagnosis not present

## 2016-08-13 DIAGNOSIS — R069 Unspecified abnormalities of breathing: Secondary | ICD-10-CM | POA: Diagnosis not present

## 2016-08-13 DIAGNOSIS — R0902 Hypoxemia: Secondary | ICD-10-CM | POA: Diagnosis not present

## 2016-08-13 DIAGNOSIS — J9602 Acute respiratory failure with hypercapnia: Secondary | ICD-10-CM | POA: Diagnosis not present

## 2016-08-13 DIAGNOSIS — Z79899 Other long term (current) drug therapy: Secondary | ICD-10-CM | POA: Diagnosis not present

## 2016-08-13 DIAGNOSIS — J9622 Acute and chronic respiratory failure with hypercapnia: Secondary | ICD-10-CM | POA: Diagnosis not present

## 2016-08-18 DIAGNOSIS — Z87891 Personal history of nicotine dependence: Secondary | ICD-10-CM | POA: Diagnosis not present

## 2016-08-18 DIAGNOSIS — I10 Essential (primary) hypertension: Secondary | ICD-10-CM | POA: Diagnosis not present

## 2016-08-18 DIAGNOSIS — J9612 Chronic respiratory failure with hypercapnia: Secondary | ICD-10-CM | POA: Diagnosis not present

## 2016-08-18 DIAGNOSIS — Z9981 Dependence on supplemental oxygen: Secondary | ICD-10-CM | POA: Diagnosis not present

## 2016-08-18 DIAGNOSIS — J441 Chronic obstructive pulmonary disease with (acute) exacerbation: Secondary | ICD-10-CM | POA: Diagnosis not present

## 2016-08-19 ENCOUNTER — Ambulatory Visit: Payer: Self-pay | Admitting: Family Medicine

## 2016-08-20 ENCOUNTER — Ambulatory Visit: Payer: Self-pay | Admitting: Family Medicine

## 2016-08-21 DIAGNOSIS — Z87891 Personal history of nicotine dependence: Secondary | ICD-10-CM | POA: Diagnosis not present

## 2016-08-21 DIAGNOSIS — J441 Chronic obstructive pulmonary disease with (acute) exacerbation: Secondary | ICD-10-CM | POA: Diagnosis not present

## 2016-08-21 DIAGNOSIS — I10 Essential (primary) hypertension: Secondary | ICD-10-CM | POA: Diagnosis not present

## 2016-08-21 DIAGNOSIS — Z9981 Dependence on supplemental oxygen: Secondary | ICD-10-CM | POA: Diagnosis not present

## 2016-08-21 DIAGNOSIS — J9612 Chronic respiratory failure with hypercapnia: Secondary | ICD-10-CM | POA: Diagnosis not present

## 2016-08-23 DIAGNOSIS — Z9981 Dependence on supplemental oxygen: Secondary | ICD-10-CM | POA: Diagnosis not present

## 2016-08-23 DIAGNOSIS — I10 Essential (primary) hypertension: Secondary | ICD-10-CM | POA: Diagnosis not present

## 2016-08-23 DIAGNOSIS — J441 Chronic obstructive pulmonary disease with (acute) exacerbation: Secondary | ICD-10-CM | POA: Diagnosis not present

## 2016-08-23 DIAGNOSIS — Z87891 Personal history of nicotine dependence: Secondary | ICD-10-CM | POA: Diagnosis not present

## 2016-08-23 DIAGNOSIS — J9612 Chronic respiratory failure with hypercapnia: Secondary | ICD-10-CM | POA: Diagnosis not present

## 2016-08-25 DIAGNOSIS — Z9981 Dependence on supplemental oxygen: Secondary | ICD-10-CM | POA: Diagnosis not present

## 2016-08-25 DIAGNOSIS — J9612 Chronic respiratory failure with hypercapnia: Secondary | ICD-10-CM | POA: Diagnosis not present

## 2016-08-25 DIAGNOSIS — I10 Essential (primary) hypertension: Secondary | ICD-10-CM | POA: Diagnosis not present

## 2016-08-25 DIAGNOSIS — J441 Chronic obstructive pulmonary disease with (acute) exacerbation: Secondary | ICD-10-CM | POA: Diagnosis not present

## 2016-08-25 DIAGNOSIS — Z87891 Personal history of nicotine dependence: Secondary | ICD-10-CM | POA: Diagnosis not present

## 2016-08-26 DIAGNOSIS — R69 Illness, unspecified: Secondary | ICD-10-CM | POA: Diagnosis not present

## 2016-08-26 DIAGNOSIS — J441 Chronic obstructive pulmonary disease with (acute) exacerbation: Secondary | ICD-10-CM | POA: Diagnosis not present

## 2016-08-26 DIAGNOSIS — Z87891 Personal history of nicotine dependence: Secondary | ICD-10-CM | POA: Diagnosis not present

## 2016-08-26 DIAGNOSIS — J9612 Chronic respiratory failure with hypercapnia: Secondary | ICD-10-CM | POA: Diagnosis not present

## 2016-08-26 DIAGNOSIS — Z9981 Dependence on supplemental oxygen: Secondary | ICD-10-CM | POA: Diagnosis not present

## 2016-08-26 DIAGNOSIS — I10 Essential (primary) hypertension: Secondary | ICD-10-CM | POA: Diagnosis not present

## 2016-08-27 ENCOUNTER — Encounter: Payer: Self-pay | Admitting: Family Medicine

## 2016-08-27 ENCOUNTER — Telehealth: Payer: Self-pay | Admitting: Family Medicine

## 2016-08-27 ENCOUNTER — Ambulatory Visit (INDEPENDENT_AMBULATORY_CARE_PROVIDER_SITE_OTHER): Payer: Medicare HMO | Admitting: Family Medicine

## 2016-08-27 VITALS — BP 120/58 | HR 91 | Temp 98.1°F | Resp 16 | Ht 61.0 in | Wt 140.0 lb

## 2016-08-27 DIAGNOSIS — J441 Chronic obstructive pulmonary disease with (acute) exacerbation: Secondary | ICD-10-CM

## 2016-08-27 DIAGNOSIS — J438 Other emphysema: Secondary | ICD-10-CM | POA: Diagnosis not present

## 2016-08-27 DIAGNOSIS — R32 Unspecified urinary incontinence: Secondary | ICD-10-CM | POA: Diagnosis not present

## 2016-08-27 DIAGNOSIS — Z87891 Personal history of nicotine dependence: Secondary | ICD-10-CM | POA: Diagnosis not present

## 2016-08-27 DIAGNOSIS — M818 Other osteoporosis without current pathological fracture: Secondary | ICD-10-CM | POA: Diagnosis not present

## 2016-08-27 DIAGNOSIS — Z9981 Dependence on supplemental oxygen: Secondary | ICD-10-CM | POA: Diagnosis not present

## 2016-08-27 DIAGNOSIS — J431 Panlobular emphysema: Secondary | ICD-10-CM

## 2016-08-27 DIAGNOSIS — J9612 Chronic respiratory failure with hypercapnia: Secondary | ICD-10-CM | POA: Diagnosis not present

## 2016-08-27 DIAGNOSIS — I10 Essential (primary) hypertension: Secondary | ICD-10-CM | POA: Diagnosis not present

## 2016-08-27 DIAGNOSIS — J449 Chronic obstructive pulmonary disease, unspecified: Secondary | ICD-10-CM | POA: Diagnosis not present

## 2016-08-27 NOTE — Patient Instructions (Signed)
Drink plenty of water Continue to encourage good diet  Please call and get a follow up with Dr Juanetta Gosling  See me in 2 months  Call sooner for problems

## 2016-08-27 NOTE — Telephone Encounter (Signed)
I spoke to [place] today and encouraged her to continue PT.

## 2016-08-27 NOTE — Progress Notes (Signed)
Chief Complaint  Patient presents with  . Follow-up  routine visit She has been in the hospital 2 x since last seen.  Both times in [facility].  I was unaware.  Records requested, but not received prior to patient discharge.  Her son, who is demanding, but unfortunately mentally ill, insists they told them she needed a BiPap at home to prevent future hospital stays.  We called [facility], the oxygen provider, and they do not have order.  After leaving, we got the hosptial record that states she did NOT qualify for BiPap.  We will call the home. She eats poorly.  Seems subdued today.  Weight stable.  Complexion ashen. Feels back to her baseline. Son says home is condemned , they are having a hard time finding another.  She feels safe in her bedroom, wehre she spends the majority of her time. We got notice that he would not cooperate with the PT who came to the house today.  She told me she did not like his bossy attitude when he told her she should do what he told her.  The son, unreliable, says he "cursed at her" and he is going to get him fired.    No chest pain or palpitations.  Mild stable pedal edema   Patient Active Problem List   Diagnosis Date Noted  . COPD (chronic obstructive pulmonary disease) (HCC) 12/19/2015  . Acute on chronic respiratory failure with hypoxia (HCC) 12/19/2015  . Hypercholesteremia 12/16/2015  . Osteoporosis 12/01/2015  . Colonoscopy refused 12/01/2015  . Macular degeneration, age related, nonexudative 11/26/2015  . Cataract incipient, senile, bilateral 11/26/2015  . Onychomycosis of toenail 11/11/2015  . Aortic atherosclerosis (HCC) 11/11/2015  . Abnormal thyroid function test 11/06/2015  . Diabetes mellitus, stable (HCC) 11/05/2015  . COPD exacerbation (HCC) 11/04/2015  . AKI (acute kidney injury) (HCC) 11/03/2015  . Normocytic anemia 11/03/2015  . Anxiety 11/03/2015  . Acute exacerbation of chronic obstructive pulmonary disease (COPD) (HCC)  09/19/2012  . CAD (coronary artery disease) 11/24/2011  . Essential hypertension   . QT prolongation   . MI, acute, non ST segment elevation (HCC) 10/05/2011    Outpatient Encounter Prescriptions as of 08/27/2016  Medication Sig  . acetaZOLAMIDE (DIAMOX) 250 MG tablet TAKE 1 TABLET BY MOUTH TWICE DAILY  . albuterol (VENTOLIN HFA) 108 (90 Base) MCG/ACT inhaler Inhale 2 puffs into the lungs every 6 (six) hours as needed for wheezing or shortness of breath.  Marland Kitchen aspirin EC 81 MG tablet Take 81 mg by mouth daily.  Marland Kitchen BREO ELLIPTA 100-25 MCG/INH AEPB Inhale 1 puff into the lungs daily.  . budesonide (PULMICORT) 0.5 MG/2ML nebulizer solution Take 0.5 mg by nebulization 2 (two) times daily.  . busPIRone (BUSPAR) 15 MG tablet TAKE 1 TABLET BY MOUTH TWICE DAILY  . cholecalciferol (VITAMIN D) 1000 units tablet Take 1,000 Units by mouth daily.  . Incontinence Supply Disposable (BRIEFS OVERNIGHT MEDIUM) MISC Four daily, as needed  . ipratropium-albuterol (DUONEB) 0.5-2.5 (3) MG/3ML SOLN Take 3 mLs by nebulization every 4 (four) hours.  Marland Kitchen lisinopril-hydrochlorothiazide (PRINZIDE,ZESTORETIC) 10-12.5 MG tablet TAKE 1 TABLET BY MOUTH EVERY DAY  . metoprolol tartrate (LOPRESSOR) 25 MG tablet TAKE 1 TABLET BY MOUTH TWICE DAILY  . Multiple Vitamin (MULTIVITAMIN WITH MINERALS) TABS tablet Take 1 tablet by mouth daily.  . ondansetron (ZOFRAN) 4 MG tablet TAKE ONE TABLET BY MOUTH EVERY 8 HOURS AS NEEDED FOR NAUSEA OR VOMITING  . potassium chloride (K-DUR) 10 MEQ tablet Take 1  tablet (10 mEq total) by mouth daily.  . predniSONE (DELTASONE) 5 MG tablet TAKE 4 TABLETS BY MOUTH DAILY AT 730  . prochlorperazine (COMPAZINE) 10 MG tablet Take 1 tablet (10 mg total) by mouth 2 (two) times daily as needed for nausea or vomiting.  . tiotropium (SPIRIVA) 18 MCG inhalation capsule Place 18 mcg into inhaler and inhale daily.  . traMADol (ULTRAM) 50 MG tablet Take 1 tablet (50 mg total) by mouth every 6 (six) hours as needed for  moderate pain or severe pain.  Marland Kitchen UNABLE TO FIND Home oxygen concentrator  o2 3 lpm vian n/c   No facility-administered encounter medications on file as of 08/27/2016.     Allergies  Allergen Reactions  . Penicillins Swelling and Other (See Comments)    Reaction:  Unspecified swelling reaction Has patient had a PCN reaction causing immediate rash, facial/tongue/throat swelling, SOB or lightheadedness with hypotension: Yes Has patient had a PCN reaction causing severe rash involving mucus membranes or skin necrosis: No Has patient had a PCN reaction that required hospitalization No Has patient had a PCN reaction occurring within the last 10 years: Yes If all of the above answers are "NO", then may proceed with Cephalosporin use.  . Sulfa Antibiotics Hives and Other (See Comments)    Reaction:  Hallucinations   . Codeine Nausea Only  . Hydrocodone Nausea Only    Review of Systems  Constitutional: Positive for fatigue. Negative for activity change, appetite change and unexpected weight change.  HENT: Negative for congestion, dental problem, postnasal drip and rhinorrhea.   Eyes: Negative for redness and visual disturbance.  Respiratory: Positive for cough, shortness of breath and wheezing.   Cardiovascular: Negative for chest pain, palpitations and leg swelling.  Gastrointestinal: Negative for abdominal pain, constipation and diarrhea.  Genitourinary: Negative for difficulty urinating and frequency.  Musculoskeletal: Negative for arthralgias and back pain.  Neurological: Positive for weakness. Negative for dizziness and headaches.  Psychiatric/Behavioral: Positive for dysphoric mood. Negative for sleep disturbance. The patient is not nervous/anxious.        At times, lonely.  Son says more confused    BP (!) 120/58 (BP Location: Left Arm, Patient Position: Sitting, Cuff Size: Normal)   Pulse 91   Temp 98.1 F (36.7 C) (Other (Comment))   Resp 16   Ht 5\' 1"  (1.549 m)   Wt 140 lb  (63.5 kg)   SpO2 92%   BMI 26.45 kg/m   Physical Exam  Constitutional: She is oriented to person, place, and time. She appears well-developed and well-nourished.  Short of breath. In wheelchair. Oxygen dependent. Appears fatigued.  HENT:  Head: Normocephalic and atraumatic.  Right Ear: External ear normal.  Left Ear: External ear normal.  Mouth/Throat: Oropharynx is clear and moist.  Edentulous. Mucous membranes dry  Eyes: Pupils are equal, round, and reactive to light. Conjunctivae are normal.  Neck: Normal range of motion. Neck supple. No thyromegaly present.  Cardiovascular: Normal rate, regular rhythm and normal heart sounds.   Pulmonary/Chest: She has no wheezes. She has no rales.  Decreased breath sounds. Tachypnea. No wheeze. Faint rales in bases    Abdominal: Soft. Bowel sounds are normal.  Musculoskeletal: Normal range of motion. She exhibits no edema.  Lymphadenopathy:    She has no cervical adenopathy.  Neurological: She is alert and oriented to person, place, and time.  Skin: Skin is warm and dry.  Psychiatric: Her behavior is normal.  Quiet, depressed.  At  Times looks irritated  ASSESSMENT/PLAN:  1. Panlobular emphysema (HCC) worsening  2. COPD with exacerbation (HCC)  3. Oxygen dependent poor social situation.  She is devoted to dysfunctional family and would be better off in a supervised setting.   Patient Instructions  Drink plenty of water Continue to encourage good diet  Please call and get a follow up with Dr Juanetta Gosling  See me in 2 months  Call sooner for problems    [name], MD

## 2016-08-27 NOTE — Telephone Encounter (Signed)
Error

## 2016-08-27 NOTE — Telephone Encounter (Signed)
Vira Blanco, nurse clinical manager with [facility] called and left message on nurse line regarding patient. She states that physical therapy went out to work with patient this morning and that patient was agitated and that she didn't understand why she was supposed to receive therapy services. She states the therapy visit was not completed and she does not think patient will actively participate in therapy although she will certainly benefit. She was just calling to follow up with Dr. Delton See and make her aware. Callback# [phone]

## 2016-08-31 DIAGNOSIS — Z87891 Personal history of nicotine dependence: Secondary | ICD-10-CM | POA: Diagnosis not present

## 2016-08-31 DIAGNOSIS — Z9981 Dependence on supplemental oxygen: Secondary | ICD-10-CM | POA: Diagnosis not present

## 2016-08-31 DIAGNOSIS — J441 Chronic obstructive pulmonary disease with (acute) exacerbation: Secondary | ICD-10-CM | POA: Diagnosis not present

## 2016-08-31 DIAGNOSIS — I10 Essential (primary) hypertension: Secondary | ICD-10-CM | POA: Diagnosis not present

## 2016-08-31 DIAGNOSIS — J9612 Chronic respiratory failure with hypercapnia: Secondary | ICD-10-CM | POA: Diagnosis not present

## 2016-09-01 ENCOUNTER — Telehealth: Payer: Self-pay

## 2016-09-01 NOTE — Telephone Encounter (Signed)
-----   Message from [name], MD sent at 08/27/2016  5:14 PM EDT ----- Please call family and tell them she did NOT qualify for home BiPAP according to [facility] records.

## 2016-09-01 NOTE — Telephone Encounter (Signed)
Called and spoke to son, aware.

## 2016-09-10 DIAGNOSIS — J9610 Chronic respiratory failure, unspecified whether with hypoxia or hypercapnia: Secondary | ICD-10-CM | POA: Diagnosis not present

## 2016-09-10 DIAGNOSIS — J449 Chronic obstructive pulmonary disease, unspecified: Secondary | ICD-10-CM | POA: Diagnosis not present

## 2016-09-14 ENCOUNTER — Encounter (HOSPITAL_COMMUNITY): Payer: Self-pay

## 2016-09-14 ENCOUNTER — Emergency Department (HOSPITAL_COMMUNITY): Payer: MEDICARE

## 2016-09-14 ENCOUNTER — Inpatient Hospital Stay (HOSPITAL_COMMUNITY)
Admission: EM | Admit: 2016-09-14 | Discharge: 2016-09-16 | DRG: 189 | Disposition: A | Discharge status: Home / Self Care | Payer: MEDICARE | Attending: Internal Medicine | Admitting: Internal Medicine

## 2016-09-14 DIAGNOSIS — J9621 Acute and chronic respiratory failure with hypoxia: Principal | ICD-10-CM

## 2016-09-14 DIAGNOSIS — M810 Age-related osteoporosis without current pathological fracture: Secondary | ICD-10-CM | POA: Diagnosis present

## 2016-09-14 DIAGNOSIS — F419 Anxiety disorder, unspecified: Secondary | ICD-10-CM | POA: Diagnosis not present

## 2016-09-14 DIAGNOSIS — N179 Acute kidney failure, unspecified: Secondary | ICD-10-CM | POA: Diagnosis present

## 2016-09-14 DIAGNOSIS — L8990 Pressure ulcer of unspecified site, unspecified stage: Secondary | ICD-10-CM | POA: Diagnosis present

## 2016-09-14 DIAGNOSIS — Z87891 Personal history of nicotine dependence: Secondary | ICD-10-CM | POA: Diagnosis not present

## 2016-09-14 DIAGNOSIS — I482 Chronic atrial fibrillation: Secondary | ICD-10-CM | POA: Diagnosis not present

## 2016-09-14 DIAGNOSIS — K219 Gastro-esophageal reflux disease without esophagitis: Secondary | ICD-10-CM | POA: Diagnosis present

## 2016-09-14 DIAGNOSIS — Z880 Allergy status to penicillin: Secondary | ICD-10-CM

## 2016-09-14 DIAGNOSIS — Z66 Do not resuscitate: Secondary | ICD-10-CM | POA: Diagnosis present

## 2016-09-14 DIAGNOSIS — R069 Unspecified abnormalities of breathing: Secondary | ICD-10-CM | POA: Diagnosis not present

## 2016-09-14 DIAGNOSIS — Z993 Dependence on wheelchair: Secondary | ICD-10-CM | POA: Diagnosis not present

## 2016-09-14 DIAGNOSIS — I130 Hypertensive heart and chronic kidney disease with heart failure and stage 1 through stage 4 chronic kidney disease, or unspecified chronic kidney disease: Secondary | ICD-10-CM | POA: Diagnosis present

## 2016-09-14 DIAGNOSIS — E872 Acidosis: Secondary | ICD-10-CM | POA: Diagnosis present

## 2016-09-14 DIAGNOSIS — Z79899 Other long term (current) drug therapy: Secondary | ICD-10-CM

## 2016-09-14 DIAGNOSIS — Z882 Allergy status to sulfonamides status: Secondary | ICD-10-CM

## 2016-09-14 DIAGNOSIS — I10 Essential (primary) hypertension: Secondary | ICD-10-CM | POA: Diagnosis not present

## 2016-09-14 DIAGNOSIS — Z9981 Dependence on supplemental oxygen: Secondary | ICD-10-CM

## 2016-09-14 DIAGNOSIS — R0602 Shortness of breath: Secondary | ICD-10-CM | POA: Diagnosis not present

## 2016-09-14 DIAGNOSIS — J441 Chronic obstructive pulmonary disease with (acute) exacerbation: Secondary | ICD-10-CM

## 2016-09-14 DIAGNOSIS — I252 Old myocardial infarction: Secondary | ICD-10-CM

## 2016-09-14 DIAGNOSIS — I509 Heart failure, unspecified: Secondary | ICD-10-CM | POA: Diagnosis present

## 2016-09-14 DIAGNOSIS — Z886 Allergy status to analgesic agent status: Secondary | ICD-10-CM

## 2016-09-14 DIAGNOSIS — F329 Major depressive disorder, single episode, unspecified: Secondary | ICD-10-CM | POA: Diagnosis present

## 2016-09-14 DIAGNOSIS — I2510 Atherosclerotic heart disease of native coronary artery without angina pectoris: Secondary | ICD-10-CM | POA: Diagnosis present

## 2016-09-14 DIAGNOSIS — E7800 Pure hypercholesterolemia, unspecified: Secondary | ICD-10-CM | POA: Diagnosis present

## 2016-09-14 DIAGNOSIS — Z7982 Long term (current) use of aspirin: Secondary | ICD-10-CM

## 2016-09-14 DIAGNOSIS — N183 Chronic kidney disease, stage 3 (moderate): Secondary | ICD-10-CM | POA: Diagnosis present

## 2016-09-14 DIAGNOSIS — R69 Illness, unspecified: Secondary | ICD-10-CM | POA: Diagnosis not present

## 2016-09-14 LAB — CBC WITH DIFFERENTIAL/PLATELET
BASOS ABS: 0 10*3/uL (ref 0.0–0.1)
BASOS PCT: 0 %
Eosinophils Absolute: 0.1 10*3/uL (ref 0.0–0.7)
Eosinophils Relative: 2 %
HEMATOCRIT: 31 % — AB (ref 36.0–46.0)
HEMOGLOBIN: 9.3 g/dL — AB (ref 12.0–15.0)
LYMPHS PCT: 11 %
Lymphs Abs: 0.7 10*3/uL (ref 0.7–4.0)
MCH: 28.9 pg (ref 26.0–34.0)
MCHC: 30 g/dL (ref 30.0–36.0)
MCV: 96.3 fL (ref 78.0–100.0)
MONO ABS: 0.4 10*3/uL (ref 0.1–1.0)
Monocytes Relative: 7 %
NEUTROS ABS: 4.9 10*3/uL (ref 1.7–7.7)
NEUTROS PCT: 80 %
Platelets: 251 10*3/uL (ref 150–400)
RBC: 3.22 MIL/uL — AB (ref 3.87–5.11)
RDW: 15.1 % (ref 11.5–15.5)
WBC: 6.2 10*3/uL (ref 4.0–10.5)

## 2016-09-14 LAB — BASIC METABOLIC PANEL
ANION GAP: 6 (ref 5–15)
BUN: 24 mg/dL — AB (ref 6–20)
CHLORIDE: 98 mmol/L — AB (ref 101–111)
CO2: 33 mmol/L — ABNORMAL HIGH (ref 22–32)
Calcium: 9.8 mg/dL (ref 8.9–10.3)
Creatinine, Ser: 1.37 mg/dL — ABNORMAL HIGH (ref 0.44–1.00)
GFR, EST AFRICAN AMERICAN: 44 mL/min — AB (ref >60)
GFR, EST NON AFRICAN AMERICAN: 38 mL/min — AB (ref >60)
Glucose, Bld: 121 mg/dL — ABNORMAL HIGH (ref 65–99)
POTASSIUM: 3.6 mmol/L (ref 3.5–5.1)
SODIUM: 137 mmol/L (ref 135–145)

## 2016-09-14 LAB — I-STAT TROPONIN, ED: Troponin i, poc: 0 ng/mL (ref 0.00–0.08)

## 2016-09-14 MED ORDER — ASPIRIN EC 81 MG PO TBEC
81.0000 mg | DELAYED_RELEASE_TABLET | Freq: Every day | ORAL | Status: DC
  Administered 2016-09-15 – 2016-09-16 (×2): 81 mg via ORAL
  Filled 2016-09-14 (×2): qty 1

## 2016-09-14 MED ORDER — BUSPIRONE HCL 5 MG PO TABS
15.0000 mg | ORAL_TABLET | Freq: Two times a day (BID) | ORAL | Status: DC
  Administered 2016-09-14 – 2016-09-16 (×4): 15 mg via ORAL
  Filled 2016-09-14 (×4): qty 3

## 2016-09-14 MED ORDER — POLYETHYLENE GLYCOL 3350 17 G PO PACK: 17.0000 g | PACK | Freq: Every day | ORAL | Status: DC | PRN

## 2016-09-14 MED ORDER — POTASSIUM CHLORIDE CRYS ER 10 MEQ PO TBCR
10.0000 meq | EXTENDED_RELEASE_TABLET | Freq: Every day | ORAL | Status: DC
  Administered 2016-09-15 – 2016-09-16 (×2): 10 meq via ORAL
  Filled 2016-09-14 (×3): qty 1

## 2016-09-14 MED ORDER — IPRATROPIUM-ALBUTEROL 0.5-2.5 (3) MG/3ML IN SOLN: 3.0000 mL | Freq: Once | RESPIRATORY_TRACT | Status: DC

## 2016-09-14 MED ORDER — KCL IN DEXTROSE-NACL 20-5-0.45 MEQ/L-%-% IV SOLN
INTRAVENOUS | Status: DC
  Administered 2016-09-14: 23:00:00 via INTRAVENOUS
  Filled 2016-09-14: qty 1000

## 2016-09-14 MED ORDER — VITAMIN D 1000 UNITS PO TABS
1000.0000 [IU] | ORAL_TABLET | Freq: Every day | ORAL | Status: DC
  Administered 2016-09-15 – 2016-09-16 (×2): 1000 [IU] via ORAL
  Filled 2016-09-14 (×2): qty 1

## 2016-09-14 MED ORDER — METHYLPREDNISOLONE SODIUM SUCC 125 MG IJ SOLR
60.0000 mg | Freq: Four times a day (QID) | INTRAMUSCULAR | Status: AC
  Administered 2016-09-14 – 2016-09-15 (×4): 60 mg via INTRAVENOUS
  Filled 2016-09-14 (×4): qty 2

## 2016-09-14 MED ORDER — ENOXAPARIN SODIUM 40 MG/0.4ML ~~LOC~~ SOLN
40.0000 mg | SUBCUTANEOUS | Status: DC
  Administered 2016-09-14 – 2016-09-15 (×2): 40 mg via SUBCUTANEOUS
  Filled 2016-09-14 (×2): qty 0.4

## 2016-09-14 MED ORDER — HYDROCHLOROTHIAZIDE 12.5 MG PO CAPS
12.5000 mg | ORAL_CAPSULE | Freq: Every day | ORAL | Status: DC
  Administered 2016-09-15 – 2016-09-16 (×2): 12.5 mg via ORAL
  Filled 2016-09-14 (×2): qty 1

## 2016-09-14 MED ORDER — GI COCKTAIL ~~LOC~~: 30.0000 mL | Freq: Once | ORAL | Status: DC

## 2016-09-14 MED ORDER — IPRATROPIUM-ALBUTEROL 0.5-2.5 (3) MG/3ML IN SOLN
3.0000 mL | RESPIRATORY_TRACT | Status: AC
  Administered 2016-09-14 (×3): 3 mL via RESPIRATORY_TRACT
  Filled 2016-09-14: qty 9

## 2016-09-14 MED ORDER — NITROGLYCERIN 0.4 MG SL SUBL: 0.4000 mg | SUBLINGUAL_TABLET | Freq: Once | SUBLINGUAL | Status: DC

## 2016-09-14 MED ORDER — ACETAMINOPHEN 650 MG RE SUPP: 650.0000 mg | Freq: Four times a day (QID) | RECTAL | Status: DC | PRN

## 2016-09-14 MED ORDER — METOPROLOL TARTRATE 25 MG PO TABS
25.0000 mg | ORAL_TABLET | Freq: Two times a day (BID) | ORAL | Status: DC
  Administered 2016-09-14 – 2016-09-16 (×4): 25 mg via ORAL
  Filled 2016-09-14 (×4): qty 1

## 2016-09-14 MED ORDER — LISINOPRIL 10 MG PO TABS
10.0000 mg | ORAL_TABLET | Freq: Every day | ORAL | Status: DC
  Administered 2016-09-15 – 2016-09-16 (×2): 10 mg via ORAL
  Filled 2016-09-14 (×2): qty 1

## 2016-09-14 MED ORDER — IPRATROPIUM-ALBUTEROL 0.5-2.5 (3) MG/3ML IN SOLN
3.0000 mL | Freq: Four times a day (QID) | RESPIRATORY_TRACT | Status: DC
  Administered 2016-09-14 – 2016-09-16 (×8): 3 mL via RESPIRATORY_TRACT
  Filled 2016-09-14 (×8): qty 3

## 2016-09-14 MED ORDER — ACETAMINOPHEN 325 MG PO TABS
650.0000 mg | ORAL_TABLET | Freq: Four times a day (QID) | ORAL | Status: DC | PRN
  Administered 2016-09-15: 650 mg via ORAL
  Filled 2016-09-14: qty 2

## 2016-09-14 MED ORDER — LISINOPRIL-HYDROCHLOROTHIAZIDE 10-12.5 MG PO TABS: 1.0000 | ORAL_TABLET | Freq: Every day | ORAL | Status: DC

## 2016-09-14 MED ORDER — ADULT MULTIVITAMIN W/MINERALS CH
1.0000 | ORAL_TABLET | Freq: Every day | ORAL | Status: DC
  Administered 2016-09-15: 1 via ORAL
  Filled 2016-09-14: qty 1

## 2016-09-14 MED ORDER — ALBUTEROL SULFATE (2.5 MG/3ML) 0.083% IN NEBU
2.5000 mg | INHALATION_SOLUTION | RESPIRATORY_TRACT | Status: DC | PRN
Start: 2016-09-14 — End: 2016-09-16

## 2016-09-14 MED ORDER — DOXYCYCLINE HYCLATE 100 MG PO TABS
100.0000 mg | ORAL_TABLET | Freq: Once | ORAL | Status: AC
  Administered 2016-09-14: 100 mg via ORAL
  Filled 2016-09-14: qty 1

## 2016-09-14 MED ORDER — ALBUTEROL SULFATE (2.5 MG/3ML) 0.083% IN NEBU: 2.5000 mg | INHALATION_SOLUTION | Freq: Once | RESPIRATORY_TRACT | Status: DC

## 2016-09-14 NOTE — ED Notes (Signed)
Assisted pt with a female urinal

## 2016-09-14 NOTE — ED Triage Notes (Signed)
Patient here from home for increased shortness of breath that started yesterday. States she has not been able to take her medications due to no help at home. Patient had 7.5 albuterol and 125mg  solumedrol enroute per EMS. PAtient on 3 lpm of home oxygen.

## 2016-09-14 NOTE — ED Notes (Signed)
Provided pt with supper tray

## 2016-09-14 NOTE — ED Notes (Signed)
EKg given to Dr. Reynolds Bowl.

## 2016-09-14 NOTE — ED Provider Notes (Signed)
[facility]-EMERGENCY DEPT Provider Note   CSN: [id number] Arrival date & time: 09/14/16  1522     History   Chief Complaint Chief Complaint  Patient presents with  . Shortness of Breath    HPI Sheila Pierce is a 71 y.o. female.  The history is provided by the patient.  Shortness of Breath  This is a new problem. The average episode lasts 2 days. The problem occurs frequently.The current episode started 2 days ago. The problem has been gradually worsening. Associated symptoms include cough, sputum production and wheezing. Pertinent negatives include no fever, no rhinorrhea, no chest pain, no syncope, no vomiting, no abdominal pain, no leg pain and no leg swelling. It is unknown what precipitated the problem. She has tried beta-agonist inhalers for the symptoms. The treatment provided mild relief. She has had prior hospitalizations. She has had prior ED visits. Associated medical issues include COPD and CAD.       71 year old female who presents for shortness of breath. She has a history of COPD with chronic respiratory failure on home 3 L oxygen, hypertension, hyperlipidemia, diabetes, coronary artery disease, chronic kidney disease. she reports 2 days of progressively worsening shortness of breath, now unable to do any activities and even eat due to shortness of breath.reports being in and out of the hospital for the past 6 months, and was recently admitted to [facility] for COPD exacerbation.Denies any chest pain, lower extremity edema, calf tenderness, nausea or vomiting,fevers or chills. States baseline cough with baseline sputum production. She had a home health nurse who recommended that she come to the hospital. EMS did evaluate patient and she received 7.5 mg of albuterol and 125 mg of Solu-Medrol in route. She does state that with breathing treatment her breathing is mildly improved. Past Medical History:  Diagnosis Date  . Allergy   . Anemia   . Anxiety   . Arthritis   .  Asthma   . Cataract   . Chronic kidney disease   . COPD (chronic obstructive pulmonary disease) (HCC)    Chronic resp failure on home O2  . Depression   . Diabetes mellitus without complication (HCC)   . Emphysema of lung (HCC)   . GERD (gastroesophageal reflux disease)   . Hypercholesteremia 12/16/2015  . Hyperglycemia    Noted 09/2011 admission in setting of steroid use with normal HgbA1C.  Marland Kitchen Hypertension   . Hypokalemia   . Hyponatremia    Noted 09/2011 admission.  . NSTEMI (non-ST elevated myocardial infarction) (HCC)    a. 09/2011 in setting of acute on chronic resp failure/COPD exacerbation --> cath demonstrated nonobstructive CAD 10/05/11 with EF 50%;  08/2012 elevated Ti in setting of COPD flare -->Echo: EF 60-65%, Gr 1 DD -->Med Rx.  . Osteoporosis   . Oxygen deficiency   . QT prolongation    Noted on EKG 09/2011 (590 in setting of K of 3, improved to 475 by discharge)  . Tobacco abuse 09/19/2012  . Trigeminal neuralgia     Patient Active Problem List   Diagnosis Date Noted  . COPD (chronic obstructive pulmonary disease) (HCC) 12/19/2015  . Acute on chronic respiratory failure with hypoxia (HCC) 12/19/2015  . Hypercholesteremia 12/16/2015  . Osteoporosis 12/01/2015  . Colonoscopy refused 12/01/2015  . Macular degeneration, age related, nonexudative 11/26/2015  . Cataract incipient, senile, bilateral 11/26/2015  . Onychomycosis of toenail 11/11/2015  . Aortic atherosclerosis (HCC) 11/11/2015  . Abnormal thyroid function test 11/06/2015  . Diabetes mellitus, stable (HCC) 11/05/2015  .  COPD exacerbation (HCC) 11/04/2015  . AKI (acute kidney injury) (HCC) 11/03/2015  . Normocytic anemia 11/03/2015  . Anxiety 11/03/2015  . Acute exacerbation of chronic obstructive pulmonary disease (COPD) (HCC) 09/19/2012  . CAD (coronary artery disease) 11/24/2011  . Essential hypertension   . QT prolongation   . MI, acute, non ST segment elevation (HCC) 10/05/2011    Past Surgical  History:  Procedure Laterality Date  . ABDOMINAL HYSTERECTOMY     bleeding  . CHOLECYSTECTOMY    . LEFT HEART CATHETERIZATION WITH CORONARY ANGIOGRAM N/A 10/05/2011   Procedure: LEFT HEART CATHETERIZATION WITH CORONARY ANGIOGRAM;  Surgeon: Tonny Bollman, MD;  Location: [facility] CATH LAB;  Service: Cardiovascular;  Laterality: N/A;  . PLANTAR'S WART EXCISION      OB History    No data available       Home Medications    Prior to Admission medications   Medication Sig Start Date End Date Taking? Authorizing Provider  acetaZOLAMIDE (DIAMOX) 250 MG tablet TAKE 1 TABLET BY MOUTH TWICE DAILY 05/31/16  Yes [name], MD  albuterol (VENTOLIN HFA) 108 (90 Base) MCG/ACT inhaler Inhale 2 puffs into the lungs every 6 (six) hours as needed for wheezing or shortness of breath. 04/22/16  Yes [name], MD  aspirin EC 81 MG tablet Take 81 mg by mouth daily.   Yes [provider]  BREO ELLIPTA 100-25 MCG/INH AEPB Inhale 1 puff into the lungs daily. 11/11/15  Yes [name], MD  busPIRone (BUSPAR) 15 MG tablet TAKE 1 TABLET BY MOUTH TWICE DAILY 05/31/16  Yes [name], MD  cholecalciferol (VITAMIN D) 1000 units tablet Take 1,000 Units by mouth daily.   Yes [provider]  ipratropium-albuterol (DUONEB) 0.5-2.5 (3) MG/3ML SOLN Take 3 mLs by nebulization every 4 (four) hours. 11/11/15  Yes [name], MD  lisinopril-hydrochlorothiazide (PRINZIDE,ZESTORETIC) 10-12.5 MG tablet TAKE 1 TABLET BY MOUTH EVERY DAY 05/31/16  Yes [name], MD  metoprolol tartrate (LOPRESSOR) 25 MG tablet TAKE 1 TABLET BY MOUTH TWICE DAILY 05/31/16  Yes [name], MD  Multiple Vitamin (MULTIVITAMIN WITH MINERALS) TABS tablet Take 1 tablet by mouth daily.   Yes [provider]  OXYGEN Inhale 3 L into the lungs every evening.   Yes [provider]  potassium chloride (K-DUR) 10 MEQ tablet Take 1 tablet (10 mEq total) by mouth daily. 03/25/16  Yes  [name], MD  predniSONE (DELTASONE) 5 MG tablet TAKE 4 TABLETS BY MOUTH DAILY AT 730 08/21/16  Yes [provider]  budesonide (PULMICORT) 0.5 MG/2ML nebulizer solution Take 0.5 mg by nebulization 2 (two) times daily.    [provider]  Incontinence Supply Disposable (BRIEFS OVERNIGHT MEDIUM) MISC Four daily, as needed Patient not taking: Reported on 09/14/2016 05/20/16   [name], MD  ondansetron (ZOFRAN) 4 MG tablet TAKE ONE TABLET BY MOUTH EVERY 8 HOURS AS NEEDED FOR NAUSEA OR VOMITING 07/29/16   [name], MD  prochlorperazine (COMPAZINE) 10 MG tablet Take 1 tablet (10 mg total) by mouth 2 (two) times daily as needed for nausea or vomiting. 04/16/16   Bethann Berkshire, MD  tiotropium (SPIRIVA) 18 MCG inhalation capsule Place 18 mcg into inhaler and inhale daily.    [provider]    Family History Family History  Problem Relation Age of Onset  . Cancer Father        Lung  . Cancer Mother  Liver  . Hypertension Son   . Hyperlipidemia Son   . Post-traumatic stress disorder Son   . Depression Son   . Kidney disease Brother        liver and kidney failure  . Hypertension Son   . Hyperlipidemia Son   . Cancer Other        colon  . Diabetes Son   . Lung disease Daughter        passed away at 85 months old    Social History Social History  Substance Use Topics  . Smoking status: Former Smoker    Packs/day: 1.00    Years: 40.00    Types: Cigarettes    Start date: 01/26/1964    Quit date: 10/2014  . Smokeless tobacco: Never Used  . Alcohol use No     Comment: occasional     Allergies   Penicillins; Sulfa antibiotics; Codeine; and Hydrocodone   Review of Systems Review of Systems  Constitutional: Negative for fever.  HENT: Negative for rhinorrhea.   Respiratory: Positive for cough, sputum production, shortness of breath and wheezing.   Cardiovascular: Negative for chest pain, leg swelling and syncope.    Gastrointestinal: Negative for abdominal pain and vomiting.  All other systems reviewed and are negative.    Physical Exam Updated Vital Signs BP 106/82   Pulse 92   Temp 98.1 F (36.7 C) (Oral)   Resp 15   Ht 5\' 1"  (1.549 m)   Wt 63.5 kg (140 lb)   SpO2 100%   BMI 26.45 kg/m   Physical Exam Physical Exam  Nursing note and vitals reviewed. Constitutional: chronically ill-appearing, non-toxic, and in mild respiratorydistress Head: Normocephalic and atraumatic.  Mouth/Throat: Oropharynx is clear and moist.  Neck: Normal range of motion. Neck supple.  Cardiovascular: Normal rate and regular rhythm.   Pulmonary/Chest: Effort increased, poor air movement throughout with faint expiratory wheeze, tachypnea  Abdominal: Soft. There is no tenderness. There is no rebound and no guarding.  Musculoskeletal: Normal range of motion. no lower extremity edema. Neurological: Alert, no facial droop, fluent speech, moves all extremities symmetrically Skin: Skin is warm and dry.  Psychiatric: Cooperative   ED Treatments / Results  Labs (all labs ordered are listed, but only abnormal results are displayed) Labs Reviewed  CBC WITH DIFFERENTIAL/PLATELET - Abnormal; Notable for the following:       Result Value   RBC 3.22 (*)    Hemoglobin 9.3 (*)    HCT 31.0 (*)    All other components within normal limits  BASIC METABOLIC PANEL - Abnormal; Notable for the following:    Chloride 98 (*)    CO2 33 (*)    Glucose, Bld 121 (*)    BUN 24 (*)    Creatinine, Ser 1.37 (*)    GFR calc non Af Amer 38 (*)    GFR calc Af Amer 44 (*)    All other components within normal limits  I-STAT TROPONIN, ED    EKG  EKG Interpretation None       Radiology Dg Chest 2 View  Result Date: 09/14/2016 CLINICAL DATA:  Shortness of breath which began 3 days ago. Patient reports being unable to take her medicines. The patient is on home oxygen and has history of COPD, previous MI, hypertension, diabetes,  former smoker. EXAM: CHEST  2 VIEW COMPARISON:  Portable chest x-ray of August 13, 2016 FINDINGS: The lungs are well-expanded. The interstitial markings are increased and slightly more conspicuous than on  the previous study. The cardiac silhouette is top-normal in size. The pulmonary vascularity is not clearly engorged. There is calcification in the wall of the thoracic aorta. There are coarse lung markings at the left lung base. The bony thorax exhibits no acute abnormality. IMPRESSION: COPD with superimposed low-grade CHF. Increased interstitial density at the lung bases likely reflects subsegmental atelectasis but early pneumonia is not excluded. Electronically Signed   By: David  [place] M.D.   On: 09/14/2016 16:06    Procedures Procedures (including critical care time) CRITICAL CARE Performed by: Lavera Guise   Total critical care time: 31 minutes  Critical care time was exclusive of separately billable procedures and treating other patients.  Critical care was necessary to treat or prevent imminent or life-threatening deterioration.  Critical care was time spent personally by me on the following activities: development of treatment plan with patient and/or surrogate as well as nursing, evaluation of patient's response to treatment, examination of patient, obtaining history from patient or surrogate, ordering and performing treatments and interventions, ordering and review of laboratory studies, ordering and review of radiographic studies, pulse oximetry and re-evaluation of patient's condition.  Medications Ordered in ED Medications  ipratropium-albuterol (DUONEB) 0.5-2.5 (3) MG/3ML nebulizer solution 3 mL (3 mLs Nebulization Given 09/14/16 1618)  doxycycline (VIBRA-TABS) tablet 100 mg (100 mg Oral Given 09/14/16 1721)     Initial Impression / Assessment and Plan / ED Course  I have reviewed the triage vital signs and the nursing notes.  Pertinent labs & imaging results that were  available during my care of the patient were reviewed by me and considered in my medical decision making (see chart for details).     71 year old who presents for shortness of breath. Presentation consistent with acute COPD exacerbation. Chronically ill, mild respiratory distress, with poor air movement on exam. she had received 7.5 albuterol per EMS. Received additional dual neb 3 with reported mild improvement in her symptoms but persistent shortness of breath. She received steroids by EMS. Chest x-ray visualized and shows no obvious infiltrate or edema. Did receive doxycyline.   EKG without acute ischemia. Normal troponin. Given ongoing shortness of breath, will admit to hospitalist service for further treatment. Spoke with Dr. Arta Silence  Final Clinical Impressions(s) / ED Diagnoses   Final diagnoses:  Acute exacerbation of chronic obstructive pulmonary disease (COPD) (HCC)    New Prescriptions New Prescriptions   No medications on file     Lavera Guise, MD 09/14/16 [phone]

## 2016-09-14 NOTE — H&P (Addendum)
[facility] History and Physical  SHEVAWN HOFER NGE:[id number] DOB: [birth year] DOA: 09/14/2016  Referring physician: Dr Verdie Mosher PCP: [name], MD   Chief Complaint: SOB  HPI: Sheila Pierce is a 71 y.o. female with history of tobacco use, HTN and COPD presenting with SOB that started 2-3 days ago and has been getting worse each day. No ^sputum or purulent sputum, no fevers, no chills.  No abd pain or CP.  Not coughing a lot just SOB.  No orthopnea or leg swelling.  Rec'd nebs in route and in ED and feeling about "50%" better now.  Has had prior admits for COPD exac and for CP w/ negative heart cath in the past.    Patient is on home O2 at 3L at home.  Doesn't know the names of most of her medications.  Does NOT want heroic measures in case of cardiac/ resp arrest, saying "just let me go" and noted that she has had DNR order in the past as well.  Her PCP works across the street.    ROS  denies CP  no joint pain   no HA  no blurry vision  no rash  no diarrhea  no nausea/ vomiting  no dysuria  no difficulty voiding  no change in urine color    Past Medical History  Past Medical History:  Diagnosis Date  . Allergy   . Anemia   . Anxiety   . Arthritis   . Asthma   . Cataract   . Chronic kidney disease   . COPD (chronic obstructive pulmonary disease) (HCC)    Chronic resp failure on home O2  . Depression   . Diabetes mellitus without complication (HCC)   . Emphysema of lung (HCC)   . GERD (gastroesophageal reflux disease)   . Hypercholesteremia 12/16/2015  . Hyperglycemia    Noted 09/2011 admission in setting of steroid use with normal HgbA1C.  Marland Kitchen Hypertension   . Hypokalemia   . Hyponatremia    Noted 09/2011 admission.  . NSTEMI (non-ST elevated myocardial infarction) (HCC)    a. 09/2011 in setting of acute on chronic resp failure/COPD exacerbation --> cath demonstrated nonobstructive CAD 10/05/11 with EF 50%;  08/2012 elevated Ti in setting of COPD flare -->Echo: EF  60-65%, Gr 1 DD -->Med Rx.  . Osteoporosis   . Oxygen deficiency   . QT prolongation    Noted on EKG 09/2011 (590 in setting of K of 3, improved to 475 by discharge)  . Tobacco abuse 09/19/2012  . Trigeminal neuralgia    Past Surgical History  Past Surgical History:  Procedure Laterality Date  . ABDOMINAL HYSTERECTOMY     bleeding  . CHOLECYSTECTOMY    . LEFT HEART CATHETERIZATION WITH CORONARY ANGIOGRAM N/A 10/05/2011   Procedure: LEFT HEART CATHETERIZATION WITH CORONARY ANGIOGRAM;  Surgeon: Tonny Bollman, MD;  Location: [facility] CATH LAB;  Service: Cardiovascular;  Laterality: N/A;  . PLANTAR'S WART EXCISION     Family History  Family History  Problem Relation Age of Onset  . Cancer Father        Lung  . Cancer Mother        Liver  . Hypertension Son   . Hyperlipidemia Son   . Post-traumatic stress disorder Son   . Depression Son   . Kidney disease Brother        liver and kidney failure  . Hypertension Son   . Hyperlipidemia Son   . Cancer Other  colon  . Diabetes Son   . Lung disease Daughter        passed away at 74 months old   Social History  reports that she quit smoking about 22 months ago. Her smoking use included Cigarettes. She started smoking about 52 years ago. She has a 40.00 pack-year smoking history. She has never used smokeless tobacco. She reports that she does not drink alcohol or use drugs. Allergies  Allergies  Allergen Reactions  . Penicillins Swelling and Other (See Comments)    Reaction:  Unspecified swelling reaction Has patient had a PCN reaction causing immediate rash, facial/tongue/throat swelling, SOB or lightheadedness with hypotension: Yes Has patient had a PCN reaction causing severe rash involving mucus membranes or skin necrosis: No Has patient had a PCN reaction that required hospitalization No Has patient had a PCN reaction occurring within the last 10 years: Yes If all of the above answers are "NO", then may proceed with  Cephalosporin use.  . Sulfa Antibiotics Hives and Other (See Comments)    Reaction:  Hallucinations   . Codeine Nausea Only  . Hydrocodone Nausea Only   Home medications Prior to Admission medications   Medication Sig Start Date End Date Taking? Authorizing Provider  albuterol (VENTOLIN HFA) 108 (90 Base) MCG/ACT inhaler Inhale 2 puffs into the lungs every 6 (six) hours as needed for wheezing or shortness of breath. 04/22/16  Yes [name], MD  aspirin EC 81 MG tablet Take 81 mg by mouth daily.   Yes [provider]  BREO ELLIPTA 100-25 MCG/INH AEPB Inhale 1 puff into the lungs daily. 11/11/15  Yes [name], MD  busPIRone (BUSPAR) 15 MG tablet TAKE 1 TABLET BY MOUTH TWICE DAILY 05/31/16  Yes [name], MD  cholecalciferol (VITAMIN D) 1000 units tablet Take 1,000 Units by mouth daily.   Yes [provider]  ipratropium-albuterol (DUONEB) 0.5-2.5 (3) MG/3ML SOLN Take 3 mLs by nebulization every 4 (four) hours. 11/11/15  Yes [name], MD  lisinopril-hydrochlorothiazide (PRINZIDE,ZESTORETIC) 10-12.5 MG tablet TAKE 1 TABLET BY MOUTH EVERY DAY 05/31/16  Yes [name], MD  metoprolol tartrate (LOPRESSOR) 25 MG tablet TAKE 1 TABLET BY MOUTH TWICE DAILY 05/31/16  Yes [name], MD  Multiple Vitamin (MULTIVITAMIN WITH MINERALS) TABS tablet Take 1 tablet by mouth daily.   Yes [provider]  OXYGEN Inhale 3 L into the lungs continuous.    Yes [provider]  potassium chloride (K-DUR) 10 MEQ tablet Take 1 tablet (10 mEq total) by mouth daily. 03/25/16  Yes [name], MD  tiotropium (SPIRIVA) 18 MCG inhalation capsule Place 18 mcg into inhaler and inhale daily.   Yes [provider]   Liver Function Tests No results for input(s): AST, ALT, ALKPHOS, BILITOT, PROT, ALBUMIN in the last 168 hours. No results for input(s): LIPASE, AMYLASE in the last 168 hours. CBC  Recent Labs Lab 09/14/16 1559  WBC  6.2  NEUTROABS 4.9  HGB 9.3*  HCT 31.0*  MCV 96.3  PLT 251   Basic Metabolic Panel  Recent Labs Lab 09/14/16 1559  NA 137  K 3.6  CL 98*  CO2 33*  GLUCOSE 121*  BUN 24*  CREATININE 1.37*  CALCIUM 9.8     Vitals:   09/14/16 1613 09/14/16 1618 09/14/16 1700 09/14/16 1903  BP:   106/82 114/87  Pulse:   92 (!) 101  Resp:   15 (!) 24  Temp:  TempSrc:      SpO2: 100% 100% 100% 97%  Weight:      Height:       Exam: Gen chron ill, pursed lip breathing No rash, cyanosis or gangrene Sclera anicteric, throat clear  No jvd or bruit Chest diffusely decreased air movement, some mild/ mod exp wheezing, no bronch BS RRR no MRG Abd soft ntnd no mass or ascites +bs GU defer MS no joint effusions or deformity Ext no LE edema / no wounds or ulcers Neuro is alert, Ox 3 , nf    Home meds: -not sure if taking > buspar -not sure if taking > lisinopril/ HCT, metoprolol 25 bid,  -not sure if taking > Breo Ellipta, Spiriva, Duoneb, Albuterol neb -not taking > pred 40 /d  -for sure taking > diamox 250 qd, KCl, vit D and asa  Cr 1.37  BUN 24 CO2 33   EKG (independ reviewed) > NSR, old AS infarct CXR (independ reviewed) > COPD, read as early CHF as well    Assessment: 1.  Acute / chronic resp failure - due to COPD exacerbation.  Plan admit, serial nebs, IV steroids. No fever or sputum change , will hold abx for now.  Not sure she had CHF, CXR looks same as films in July 2018 and may be chronic IS changes.  Not vol overloaded on exam.  Will give cautious IVF's 2.  DNR - pt requests  3   COPD / chron resp failure on home O2 4.  HTN - cont metop/ lisinopril/ HCT 5.  Resp acidosis - cont diamox 6.  Anxiety - cont Buspar 7.  AKI - cautious IVF's 8.  Pharmacy - pt not sure what she is supposed to be taking at home, consider check with PCP during daytime hours   Plan - as above      Talisa Petrak D [facility] Pager [phone]   If 7PM-7AM, please contact  night-coverage [email or web address] Password [facility] 09/14/2016, 7:33 PM

## 2016-09-14 NOTE — ED Notes (Signed)
ED Provider at bedside. 

## 2016-09-15 ENCOUNTER — Other Ambulatory Visit: Payer: Self-pay | Admitting: Family Medicine

## 2016-09-15 DIAGNOSIS — I10 Essential (primary) hypertension: Secondary | ICD-10-CM | POA: Diagnosis not present

## 2016-09-15 DIAGNOSIS — J441 Chronic obstructive pulmonary disease with (acute) exacerbation: Secondary | ICD-10-CM | POA: Diagnosis not present

## 2016-09-15 DIAGNOSIS — R69 Illness, unspecified: Secondary | ICD-10-CM | POA: Diagnosis not present

## 2016-09-15 DIAGNOSIS — N179 Acute kidney failure, unspecified: Secondary | ICD-10-CM | POA: Diagnosis not present

## 2016-09-15 DIAGNOSIS — J9621 Acute and chronic respiratory failure with hypoxia: Secondary | ICD-10-CM | POA: Diagnosis not present

## 2016-09-15 DIAGNOSIS — L8990 Pressure ulcer of unspecified site, unspecified stage: Secondary | ICD-10-CM | POA: Insufficient documentation

## 2016-09-15 HISTORY — DX: Pressure ulcer of unspecified site, unspecified stage: L89.90

## 2016-09-15 LAB — BASIC METABOLIC PANEL
Anion gap: 8 (ref 5–15)
BUN: 28 mg/dL — AB (ref 6–20)
CHLORIDE: 96 mmol/L — AB (ref 101–111)
CO2: 29 mmol/L (ref 22–32)
CREATININE: 1.24 mg/dL — AB (ref 0.44–1.00)
Calcium: 9.5 mg/dL (ref 8.9–10.3)
GFR calc Af Amer: 49 mL/min — ABNORMAL LOW (ref >60)
GFR calc non Af Amer: 43 mL/min — ABNORMAL LOW (ref >60)
Glucose, Bld: 220 mg/dL — ABNORMAL HIGH (ref 65–99)
POTASSIUM: 4 mmol/L (ref 3.5–5.1)
Sodium: 133 mmol/L — ABNORMAL LOW (ref 135–145)

## 2016-09-15 LAB — CBC
HEMATOCRIT: 28.9 % — AB (ref 36.0–46.0)
Hemoglobin: 8.8 g/dL — ABNORMAL LOW (ref 12.0–15.0)
MCH: 28.8 pg (ref 26.0–34.0)
MCHC: 30.4 g/dL (ref 30.0–36.0)
MCV: 94.4 fL (ref 78.0–100.0)
PLATELETS: 252 10*3/uL (ref 150–400)
RBC: 3.06 MIL/uL — AB (ref 3.87–5.11)
RDW: 15 % (ref 11.5–15.5)
WBC: 6.8 10*3/uL (ref 4.0–10.5)

## 2016-09-15 MED ORDER — DOXYCYCLINE HYCLATE 100 MG PO TABS
100.0000 mg | ORAL_TABLET | Freq: Two times a day (BID) | ORAL | Status: DC
  Administered 2016-09-15 – 2016-09-16 (×3): 100 mg via ORAL
  Filled 2016-09-15 (×3): qty 1

## 2016-09-15 MED ORDER — GUAIFENESIN ER 600 MG PO TB12
1200.0000 mg | ORAL_TABLET | Freq: Two times a day (BID) | ORAL | Status: DC
  Administered 2016-09-15 – 2016-09-16 (×3): 1200 mg via ORAL
  Filled 2016-09-15 (×3): qty 2

## 2016-09-15 MED ORDER — ADULT MULTIVITAMIN W/MINERALS CH
1.0000 | ORAL_TABLET | Freq: Every day | ORAL | Status: DC
  Administered 2016-09-16: 1 via ORAL
  Filled 2016-09-15: qty 1

## 2016-09-15 NOTE — Progress Notes (Signed)
RN placed pts pink wallet/purse in drawer in room per pt request.

## 2016-09-15 NOTE — Plan of Care (Signed)
Problem: Pain Managment: Goal: General experience of comfort will improve Outcome: Progressing Pt c/o bilateral leg pain, given Tylenol x1. No other c/o pain. Will continue to monitor pt

## 2016-09-15 NOTE — Care Management Note (Addendum)
Case Management Note  Patient Details  Name: Sheila Pierce MRN: [id number] Date of Birth: [birth year]  Subjective/Objective: Adm with acute exacerbation of COPD. From home, two sons live with her. She is ind with ADL's, reports furniture walking at home, uses WC in the community. Discussed need for RW for home use, patient declines. Patient has continuous oxygen provided by [employer]. She has CAP aides, M-F 3 hours a day, Sat/Sun -aides "in and out".                 Action/Plan: Anticipate DC home with self care. Patient has port tank for transport home.  CM contacted pt's son to notify of need for tank at time of DC per patient request.    Expected Discharge Date:       09/16/2016           Expected Discharge Plan:  Home/Self Care  In-House Referral:     Discharge planning Services  CM Consult  Post Acute Care Choice:  NA Choice offered to:  NA  DME Arranged:    DME Agency:     HH Arranged:    HH Agency:     Status of Service:  In process, will continue to follow  If discussed at Long Length of Stay Meetings, dates discussed:    Additional Comments:  Sharelle Burditt, Chrystine Oiler, RN 09/15/2016, 11:12 AM

## 2016-09-15 NOTE — Progress Notes (Signed)
Inpatient Diabetes Program Recommendations  AACE/ADA: New Consensus Statement on Inpatient Glycemic Control (2015)  Target Ranges:  Prepandial:   less than 140 mg/dL      Peak postprandial:   less than 180 mg/dL (1-2 hours)      Critically ill patients:  140 - 180 mg/dL   Results for NEVEYAH, THEILEN (MRN [id number]) as of 09/15/2016 11:57  Ref. Range 09/14/2016 15:59 09/15/2016 05:29  Glucose Latest Ref Range: 65 - 99 mg/dL 789 (H) 381 (H)  Results for MCKYNZIE, COWEN (MRN [id number]) as of 09/15/2016 11:57  Ref. Range 04/01/2016 09:43  Hemoglobin A1C Latest Ref Range: 4.8 - 5.6 % 5.1   Review of Glycemic Control  Diabetes history: DM2 (per H&P) Outpatient Diabetes medications: None Current orders for Inpatient glycemic control: None  Inpatient Diabetes Program Recommendations: Correction (SSI): While inpatient, please consider ordering CBGs with Novolog correction scale ACHS.  NOTE: Patient has a history of DM2 and is ordered steroids which is contributing to hyperglycemia.   Thanks, Orlando Penner, RN, MSN, CDE Diabetes Coordinator Inpatient Diabetes Program [phone] (Team Pager from 8am to 5pm)

## 2016-09-15 NOTE — Progress Notes (Signed)
PROGRESS NOTE    Sheila Pierce  FRT:[id number] DOB: [birth year] DOA: 09/14/2016 PCP: [name], MD    Brief Narrative:  71 year old female with a history of COPD and chronic respiratory failure presents to the hospital with shortness of breath. She was found to have COPD exacerbation and admitted for further treatments.   Assessment & Plan:   Principal Problem:   Acute exacerbation of chronic obstructive pulmonary disease (COPD) (HCC) Active Problems:   Essential hypertension   CAD (coronary artery disease)   AKI (acute kidney injury) (HCC)   Anxiety   COPD exacerbation (HCC)   Acute on chronic respiratory failure with hypoxia (HCC)   Pressure injury of skin   1. COPD exacerbation. Started on steroids, antibiotics and nebulizer treatments. Continue pulmonary hygiene. 2. Acute on chronic respiratory failure with hypoxia. Patient appears to have quite advanced pulmonary disease at baseline has very limited functional capacity. She feels like her respiratory status is approaching baseline. Continue current treatments for another 24 hours and if stable, can consider discharge home tomorrow. 3. Hypertension. Continue metoprolol/lisinopril/hydrochlorothiazide. Blood pressure stable. 4. Respiratory acidosis. Continue on Diamox. 5. Acute kidney injury. Mildly improved with intravenous fluids. We'll hold off on further IV fluids.   DVT prophylaxis: Lovenox Code Status: DO NOT RESUSCITATE Family Communication: No family present Disposition Plan: Discharge home once improved   Consultants:     Procedures:     Antimicrobials:   Doxycycline 8/21>>   Subjective: Reports that she is feeling better today, no significant cough, still appears to be short of breath.  Objective: Vitals:   09/15/16 0428 09/15/16 0816 09/15/16 1300 09/15/16 1503  BP:   (!) 145/89   Pulse:   (!) 102   Resp:   20   Temp:   98.3 F (36.8 C)   TempSrc:   Oral   SpO2:  95% 96% 98%    Weight: 62.9 kg (138 lb 10.7 oz)     Height:        Intake/Output Summary (Last 24 hours) at 09/15/16 1612 Last data filed at 09/15/16 1200  Gross per 24 hour  Intake          1979.33 ml  Output              350 ml  Net          1629.33 ml   Filed Weights   09/14/16 1525 09/14/16 2031 09/15/16 0428  Weight: 63.5 kg (140 lb) 63 kg (138 lb 12.8 oz) 62.9 kg (138 lb 10.7 oz)    Examination:  General exam: Appears calm and comfortable  Respiratory system: Clear to auscultation. Increased respiratory effort Cardiovascular system: S1 & S2 heard, RRR. No JVD, murmurs, rubs, gallops or clicks. No pedal edema. Gastrointestinal system: Abdomen is nondistended, soft and nontender. No organomegaly or masses felt. Normal bowel sounds heard. Central nervous system: Alert and oriented. No focal neurological deficits. Extremities: Symmetric 5 x 5 power. Skin: No rashes, lesions or ulcers Psychiatry: Judgement and insight appear normal. Mood & affect appropriate.     Data Reviewed: I have personally reviewed following labs and imaging studies  CBC:  Recent Labs Lab 09/14/16 1559 09/15/16 0529  WBC 6.2 6.8  NEUTROABS 4.9  --   HGB 9.3* 8.8*  HCT 31.0* 28.9*  MCV 96.3 94.4  PLT 251 252   Basic Metabolic Panel:  Recent Labs Lab 09/14/16 1559 09/15/16 0529  NA 137 133*  K 3.6 4.0  CL 98* 96*  CO2  33* 29  GLUCOSE 121* 220*  BUN 24* 28*  CREATININE 1.37* 1.24*  CALCIUM 9.8 9.5   GFR: Estimated Creatinine Clearance: 34.5 mL/min (A) (by C-G formula based on SCr of 1.24 mg/dL (H)). Liver Function Tests: No results for input(s): AST, ALT, ALKPHOS, BILITOT, PROT, ALBUMIN in the last 168 hours. No results for input(s): LIPASE, AMYLASE in the last 168 hours. No results for input(s): AMMONIA in the last 168 hours. Coagulation Profile: No results for input(s): INR, PROTIME in the last 168 hours. Cardiac Enzymes: No results for input(s): CKTOTAL, CKMB, CKMBINDEX, TROPONINI in the  last 168 hours. BNP (last 3 results) No results for input(s): PROBNP in the last 8760 hours. HbA1C: No results for input(s): HGBA1C in the last 72 hours. CBG: No results for input(s): GLUCAP in the last 168 hours. Lipid Profile: No results for input(s): CHOL, HDL, LDLCALC, TRIG, CHOLHDL, LDLDIRECT in the last 72 hours. Thyroid Function Tests: No results for input(s): TSH, T4TOTAL, FREET4, T3FREE, THYROIDAB in the last 72 hours. Anemia Panel: No results for input(s): VITAMINB12, FOLATE, FERRITIN, TIBC, IRON, RETICCTPCT in the last 72 hours. Sepsis Labs: No results for input(s): PROCALCITON, LATICACIDVEN in the last 168 hours.  No results found for this or any previous visit (from the past 240 hour(s)).       Radiology Studies: Dg Chest 2 View  Result Date: 09/14/2016 CLINICAL DATA:  Shortness of breath which began 3 days ago. Patient reports being unable to take her medicines. The patient is on home oxygen and has history of COPD, previous MI, hypertension, diabetes, former smoker. EXAM: CHEST  2 VIEW COMPARISON:  Portable chest x-ray of August 13, 2016 FINDINGS: The lungs are well-expanded. The interstitial markings are increased and slightly more conspicuous than on the previous study. The cardiac silhouette is top-normal in size. The pulmonary vascularity is not clearly engorged. There is calcification in the wall of the thoracic aorta. There are coarse lung markings at the left lung base. The bony thorax exhibits no acute abnormality. IMPRESSION: COPD with superimposed low-grade CHF. Increased interstitial density at the lung bases likely reflects subsegmental atelectasis but early pneumonia is not excluded. Electronically Signed   By: David  [place] M.D.   On: 09/14/2016 16:06        Scheduled Meds: . aspirin EC  81 mg Oral Daily  . busPIRone  15 mg Oral BID  . cholecalciferol  1,000 Units Oral Daily  . doxycycline  100 mg Oral Q12H  . enoxaparin (LOVENOX) injection  40 mg  Subcutaneous Q24H  . guaiFENesin  1,200 mg Oral BID  . lisinopril  10 mg Oral Daily   And  . hydrochlorothiazide  12.5 mg Oral Daily  . ipratropium-albuterol  3 mL Nebulization Q6H  . methylPREDNISolone (SOLU-MEDROL) injection  60 mg Intravenous Q6H  . metoprolol tartrate  25 mg Oral BID  . [START ON 09/16/2016] multivitamin with minerals  1 tablet Oral QAC breakfast  . potassium chloride  10 mEq Oral Daily   Continuous Infusions:   LOS: 1 day    Time spent:    Boris Engelmann, MD [facility] Pager [phone]  If 7PM-7AM, please contact night-coverage [email or web address] Password [email or web address] 09/15/2016, 4:12 PM

## 2016-09-16 DIAGNOSIS — R69 Illness, unspecified: Secondary | ICD-10-CM | POA: Diagnosis not present

## 2016-09-16 DIAGNOSIS — N179 Acute kidney failure, unspecified: Secondary | ICD-10-CM | POA: Diagnosis not present

## 2016-09-16 DIAGNOSIS — I10 Essential (primary) hypertension: Secondary | ICD-10-CM | POA: Diagnosis not present

## 2016-09-16 DIAGNOSIS — J441 Chronic obstructive pulmonary disease with (acute) exacerbation: Secondary | ICD-10-CM | POA: Diagnosis not present

## 2016-09-16 DIAGNOSIS — J9621 Acute and chronic respiratory failure with hypoxia: Secondary | ICD-10-CM | POA: Diagnosis not present

## 2016-09-16 MED ORDER — DOXYCYCLINE HYCLATE 100 MG PO TABS: 100.0000 mg | ORAL_TABLET | Freq: Two times a day (BID) | ORAL | 0 refills | Status: DC

## 2016-09-16 MED ORDER — GUAIFENESIN ER 600 MG PO TB12: 600.0000 mg | ORAL_TABLET | Freq: Two times a day (BID) | ORAL | 0 refills | Status: DC

## 2016-09-16 MED ORDER — PREDNISONE 10 MG PO TABS: ORAL_TABLET | ORAL | 0 refills | Status: DC

## 2016-09-16 MED ORDER — PREDNISONE 10 MG PO TABS: 50.0000 mg | ORAL_TABLET | ORAL | Status: DC

## 2016-09-16 NOTE — Care Management Note (Addendum)
Case Management Note  Patient Details  Name: Sheila Pierce MRN: [id number] Date of Birth: 1945/05/19    Expected Discharge Date:  09/16/16               Expected Discharge Plan:  Home/Self Care with home health  In-House Referral:     Discharge planning Services  CM Consult  Post Acute Care Choice:  NA Choice offered to:  NA  DME Arranged:    DME Agency:     HH Arranged:  RN HH Agency:  [facility]  Status of Service:  Completed, signed off  If discussed at Long Length of Stay Meetings, dates discussed:    Additional Comments: Patient discharging home today. MD ordered [facility] RN. Patient agreeable, has no preference of HH agencies. Sheila Pierce of [facility] notified and will obtain orders from chart. Patient aware [facility] has 48 hours to initiate services. Aava Deland, Chrystine Oiler, RN 09/16/2016, 3:21 PM

## 2016-09-16 NOTE — Care Management Important Message (Signed)
Important Message  Patient Details  Name: Sheila Pierce MRN: [id number] Date of Birth: 10-21-1945   Medicare Important Message Given:  Yes    Marky Buresh, Chrystine Oiler, RN 09/16/2016, 9:03 AM

## 2016-09-16 NOTE — Discharge Summary (Addendum)
Physician Discharge Summary  Sheila Pierce ZOX:[id number] DOB: January 24, 1946 DOA: 09/14/2016  PCP: [name], MD  Admit date: 09/14/2016 Discharge date: 09/16/2016  Admitted From: home Disposition:  home  Recommendations for Outpatient Follow-up:  1. Follow up with PCP in 1-2 weeks 2. Please obtain BMP/CBC in one week 3. Repeat chest xray in 3-4 week to ensure resolution  Home Health:[facility] Equipment/Devices: oxygen 3L  Discharge Condition: stable CODE STATUS: DNR Diet recommendation: Heart Healthy   Brief/Interim Summary: 71 y/o female with history of advanced COPD on 3L of oxygen and very poor functional capacity at baseline, presented to the hospital with shortness of breath and found to have COPD exacerbation. She was started on intravenous steroids, antibiotics and nebulizer treatments. With supportive treatments, her respiratory status has improved. She is breathing comfortably on room air and feels that her shortness of breath is approaching baseline. She is mostly wheelchair bound, since any ambulation significantly impacts her breathing. She will be discharged home on a course of doxycycline, prednisone taper and continue on neb treatments. She appears stable for discharge home.  Discharge Diagnoses:  Principal Problem:   Acute exacerbation of chronic obstructive pulmonary disease (COPD) (HCC) Active Problems:   Essential hypertension   CAD (coronary artery disease)   AKI (acute kidney injury) (HCC)   Anxiety   COPD exacerbation (HCC)   Acute on chronic respiratory failure with hypoxia (HCC)   Pressure injury of skin Chronic kidney disease stage III     Discharge Instructions  Discharge Instructions    Diet - low sodium heart healthy    Complete by:  As directed    Increase activity slowly    Complete by:  As directed      Allergies as of 09/16/2016      Reactions   Penicillins Swelling, Other (See Comments)   Reaction:  Unspecified swelling reaction Has  patient had a PCN reaction causing immediate rash, facial/tongue/throat swelling, SOB or lightheadedness with hypotension: Yes Has patient had a PCN reaction causing severe rash involving mucus membranes or skin necrosis: No Has patient had a PCN reaction that required hospitalization No Has patient had a PCN reaction occurring within the last 10 years: Yes If all of the above answers are "NO", then may proceed with Cephalosporin use.   Sulfa Antibiotics Hives, Other (See Comments)   Reaction:  Hallucinations   Codeine Nausea Only   Hydrocodone Nausea Only      Medication List    TAKE these medications   aspirin EC 81 MG tablet Take 81 mg by mouth daily.   BREO ELLIPTA 100-25 MCG/INH Aepb Generic drug:  fluticasone furoate-vilanterol Inhale 1 puff into the lungs daily.   busPIRone 15 MG tablet Commonly known as:  BUSPAR TAKE 1 TABLET BY MOUTH TWICE DAILY   cholecalciferol 1000 units tablet Commonly known as:  VITAMIN D Take 1,000 Units by mouth daily.   doxycycline 100 MG tablet Commonly known as:  VIBRA-TABS Take 1 tablet (100 mg total) by mouth every 12 (twelve) hours.   guaiFENesin 600 MG 12 hr tablet Commonly known as:  MUCINEX Take 1 tablet (600 mg total) by mouth 2 (two) times daily.   ipratropium-albuterol 0.5-2.5 (3) MG/3ML Soln Commonly known as:  DUONEB Take 3 mLs by nebulization every 4 (four) hours.   lisinopril-hydrochlorothiazide 10-12.5 MG tablet Commonly known as:  PRINZIDE,ZESTORETIC TAKE 1 TABLET BY MOUTH EVERY DAY   metoprolol tartrate 25 MG tablet Commonly known as:  LOPRESSOR TAKE 1 TABLET BY MOUTH  TWICE DAILY   multivitamin with minerals Tabs tablet Take 1 tablet by mouth daily.   OXYGEN Inhale 3 L into the lungs continuous.   potassium chloride 10 MEQ tablet Commonly known as:  K-DUR Take 1 tablet (10 mEq total) by mouth daily.   predniSONE 10 MG tablet Commonly known as:  DELTASONE Take 40mg  po daily for 2 days then 30mg  daily for 2  days then 20mg  daily for 2 days then 10mg  daily for 2 days then stop   tiotropium 18 MCG inhalation capsule Commonly known as:  SPIRIVA Place 18 mcg into inhaler and inhale daily.   VENTOLIN HFA 108 (90 Base) MCG/ACT inhaler Generic drug:  albuterol INHALE 2 PUFFS INTO THE LUNGS EVERY SIX HOURS AS NEEDED FOR WHEEZING OR SHORTNESS OF BREATH What changed:  See the new instructions.            Discharge Care Instructions        Start     Ordered   09/16/16 0000  guaiFENesin (MUCINEX) 600 MG 12 hr tablet  2 times daily     09/16/16 1449   09/16/16 0000  predniSONE (DELTASONE) 10 MG tablet     09/16/16 1449   09/16/16 0000  doxycycline (VIBRA-TABS) 100 MG tablet  Every 12 hours     09/16/16 1449   09/16/16 0000  Increase activity slowly     09/16/16 1449   09/16/16 0000  Diet - low sodium heart healthy     09/16/16 1449      Allergies  Allergen Reactions  . Penicillins Swelling and Other (See Comments)    Reaction:  Unspecified swelling reaction Has patient had a PCN reaction causing immediate rash, facial/tongue/throat swelling, SOB or lightheadedness with hypotension: Yes Has patient had a PCN reaction causing severe rash involving mucus membranes or skin necrosis: No Has patient had a PCN reaction that required hospitalization No Has patient had a PCN reaction occurring within the last 10 years: Yes If all of the above answers are "NO", then may proceed with Cephalosporin use.  . Sulfa Antibiotics Hives and Other (See Comments)    Reaction:  Hallucinations   . Codeine Nausea Only  . Hydrocodone Nausea Only    Consultations:     Procedures/Studies: Dg Chest 2 View  Result Date: 09/14/2016 CLINICAL DATA:  Shortness of breath which began 3 days ago. Patient reports being unable to take her medicines. The patient is on home oxygen and has history of COPD, previous MI, hypertension, diabetes, former smoker. EXAM: CHEST  2 VIEW COMPARISON:  Portable chest x-ray of  August 13, 2016 FINDINGS: The lungs are well-expanded. The interstitial markings are increased and slightly more conspicuous than on the previous study. The cardiac silhouette is top-normal in size. The pulmonary vascularity is not clearly engorged. There is calcification in the wall of the thoracic aorta. There are coarse lung markings at the left lung base. The bony thorax exhibits no acute abnormality. IMPRESSION: COPD with superimposed low-grade CHF. Increased interstitial density at the lung bases likely reflects subsegmental atelectasis but early pneumonia is not excluded. Electronically Signed   By: David  [place] M.D.   On: 09/14/2016 16:06       Subjective: Shortness of breath improving. No cough, wheezing is better  Discharge Exam: Vitals:   09/16/16 0956 09/16/16 1347  BP:    Pulse:    Resp:    Temp:    SpO2: 98% 99%   Vitals:   09/16/16 0205 09/16/16 0539 09/16/16  6244 09/16/16 1347  BP:  (!) 115/50    Pulse:  93    Resp:  20    Temp:  98.9 F (37.2 C)    TempSrc:  Oral    SpO2: 98% 93% 98% 99%  Weight:  64.7 kg (142 lb 10.2 oz)    Height:        General: Pt is alert, awake, not in acute distress Cardiovascular: RRR, S1/S2 +, no rubs, no gallops Respiratory: CTA bilaterally, no wheezing, no rhonchi Abdominal: Soft, NT, ND, bowel sounds + Extremities: no edema, no cyanosis    The results of significant diagnostics from this hospitalization (including imaging, microbiology, ancillary and laboratory) are listed below for reference.     Microbiology: No results found for this or any previous visit (from the past 240 hour(s)).   Labs: BNP (last 3 results)  Recent Labs  12/19/15 1225 05/15/16 2247  BNP 25.0 19.0   Basic Metabolic Panel:  Recent Labs Lab 09/14/16 1559 09/15/16 0529  NA 137 133*  K 3.6 4.0  CL 98* 96*  CO2 33* 29  GLUCOSE 121* 220*  BUN 24* 28*  CREATININE 1.37* 1.24*  CALCIUM 9.8 9.5   Liver Function Tests: No results for  input(s): AST, ALT, ALKPHOS, BILITOT, PROT, ALBUMIN in the last 168 hours. No results for input(s): LIPASE, AMYLASE in the last 168 hours. No results for input(s): AMMONIA in the last 168 hours. CBC:  Recent Labs Lab 09/14/16 1559 09/15/16 0529  WBC 6.2 6.8  NEUTROABS 4.9  --   HGB 9.3* 8.8*  HCT 31.0* 28.9*  MCV 96.3 94.4  PLT 251 252   Cardiac Enzymes: No results for input(s): CKTOTAL, CKMB, CKMBINDEX, TROPONINI in the last 168 hours. BNP: Invalid input(s): POCBNP CBG: No results for input(s): GLUCAP in the last 168 hours. D-Dimer No results for input(s): DDIMER in the last 72 hours. Hgb A1c No results for input(s): HGBA1C in the last 72 hours. Lipid Profile No results for input(s): CHOL, HDL, LDLCALC, TRIG, CHOLHDL, LDLDIRECT in the last 72 hours. Thyroid function studies No results for input(s): TSH, T4TOTAL, T3FREE, THYROIDAB in the last 72 hours.  Invalid input(s): FREET3 Anemia work up No results for input(s): VITAMINB12, FOLATE, FERRITIN, TIBC, IRON, RETICCTPCT in the last 72 hours. Urinalysis    Component Value Date/Time   COLORURINE YELLOW 04/16/2016 1920   APPEARANCEUR HAZY (A) 04/16/2016 1920   LABSPEC 1.006 04/16/2016 1920   PHURINE 6.0 04/16/2016 1920   GLUCOSEU NEGATIVE 04/16/2016 1920   HGBUR SMALL (A) 04/16/2016 1920   BILIRUBINUR NEGATIVE 04/16/2016 1920   KETONESUR NEGATIVE 04/16/2016 1920   PROTEINUR NEGATIVE 04/16/2016 1920   NITRITE POSITIVE (A) 04/16/2016 1920   LEUKOCYTESUR LARGE (A) 04/16/2016 1920   Sepsis Labs Invalid input(s): PROCALCITONIN,  WBC,  LACTICIDVEN Microbiology No results found for this or any previous visit (from the past 240 hour(s)).   Time coordinating discharge: Over 30 minutes  SIGNED:   Erick Blinks, MD  [facility] 09/16/2016, 2:50 PM Pager   If 7PM-7AM, please contact night-coverage [email or web address] Password [email or web address]

## 2016-09-19 DIAGNOSIS — I2510 Atherosclerotic heart disease of native coronary artery without angina pectoris: Secondary | ICD-10-CM | POA: Diagnosis not present

## 2016-09-19 DIAGNOSIS — R69 Illness, unspecified: Secondary | ICD-10-CM | POA: Diagnosis not present

## 2016-09-19 DIAGNOSIS — J441 Chronic obstructive pulmonary disease with (acute) exacerbation: Secondary | ICD-10-CM | POA: Diagnosis not present

## 2016-09-19 DIAGNOSIS — Z87891 Personal history of nicotine dependence: Secondary | ICD-10-CM | POA: Diagnosis not present

## 2016-09-19 DIAGNOSIS — I10 Essential (primary) hypertension: Secondary | ICD-10-CM | POA: Diagnosis not present

## 2016-09-19 DIAGNOSIS — E119 Type 2 diabetes mellitus without complications: Secondary | ICD-10-CM | POA: Diagnosis not present

## 2016-09-19 DIAGNOSIS — Z7982 Long term (current) use of aspirin: Secondary | ICD-10-CM | POA: Diagnosis not present

## 2016-09-19 DIAGNOSIS — Z7951 Long term (current) use of inhaled steroids: Secondary | ICD-10-CM | POA: Diagnosis not present

## 2016-09-19 DIAGNOSIS — J9621 Acute and chronic respiratory failure with hypoxia: Secondary | ICD-10-CM | POA: Diagnosis not present

## 2016-09-19 DIAGNOSIS — L89151 Pressure ulcer of sacral region, stage 1: Secondary | ICD-10-CM | POA: Diagnosis not present

## 2016-09-20 ENCOUNTER — Encounter: Payer: Self-pay | Admitting: Podiatry

## 2016-09-20 ENCOUNTER — Ambulatory Visit (INDEPENDENT_AMBULATORY_CARE_PROVIDER_SITE_OTHER): Payer: Medicare HMO | Admitting: Podiatry

## 2016-09-20 DIAGNOSIS — B351 Tinea unguium: Secondary | ICD-10-CM | POA: Diagnosis not present

## 2016-09-20 DIAGNOSIS — R0989 Other specified symptoms and signs involving the circulatory and respiratory systems: Secondary | ICD-10-CM

## 2016-09-20 DIAGNOSIS — M79675 Pain in left toe(s): Secondary | ICD-10-CM | POA: Diagnosis not present

## 2016-09-20 DIAGNOSIS — M79674 Pain in right toe(s): Secondary | ICD-10-CM | POA: Diagnosis not present

## 2016-09-20 NOTE — Progress Notes (Signed)
Patient ID: Sheila Pierce, female   DOB: Jan 10, 1946, 71 y.o.   MRN: [id number]     Subjective: Patient presents for scheduled visit for debridement of symptomatic mycotic toenails. The patient's son and representative from social service are present in the treatment room today Patient is a known diabetic without history of foot ulceration or claudication or amputation Patient is a former smoker Patient's son stated that she is recently discontinue diabetic medication   Objective: Patient transfers from wheelchair to treatment table  Patient appears orientated 3  Vascular: No peripheral edema or calf tenderness bilaterally DP pulses 1/4 bilaterally PT pulses 2/4 bilaterally Capillary reflex delay bilaterally  Neurological: Sensation to 10 g monofilament wire intact 4/5 bilaterally Vibratory sensation reactive bilaterally Ankle reflex equal reactive bilaterally  Dermatological: Atrophic skin with absent hair growth bilaterally No open skin lesions bilaterally The toenails are elongated, brittle, deformed, discolored and tender to direct palpation 6-10  Musculoskeletal: Patient transfer from wheelchair to treatment chair Hammertoes fifth bilaterally (overlapping fifth toes) Living second third toes bilaterally Manual motor testing dorsi flexion, plantar flexion, inversion, eversion    Assessment: Diabetic with decreased pedal pulses symptomatic onychomycoses 6-10  Plan: The toenails 6-10 were debrided mechanically analytic without any bleeding  Reappoint 3 months

## 2016-09-20 NOTE — Patient Instructions (Signed)

## 2016-09-22 DIAGNOSIS — J9612 Chronic respiratory failure with hypercapnia: Secondary | ICD-10-CM | POA: Diagnosis not present

## 2016-09-22 DIAGNOSIS — I10 Essential (primary) hypertension: Secondary | ICD-10-CM | POA: Diagnosis not present

## 2016-09-22 DIAGNOSIS — J449 Chronic obstructive pulmonary disease, unspecified: Secondary | ICD-10-CM | POA: Diagnosis not present

## 2016-09-22 DIAGNOSIS — J9611 Chronic respiratory failure with hypoxia: Secondary | ICD-10-CM | POA: Diagnosis not present

## 2016-09-26 DIAGNOSIS — R0902 Hypoxemia: Secondary | ICD-10-CM | POA: Diagnosis not present

## 2016-09-26 DIAGNOSIS — J439 Emphysema, unspecified: Secondary | ICD-10-CM | POA: Diagnosis not present

## 2016-09-26 DIAGNOSIS — Z87891 Personal history of nicotine dependence: Secondary | ICD-10-CM | POA: Diagnosis not present

## 2016-09-26 DIAGNOSIS — J441 Chronic obstructive pulmonary disease with (acute) exacerbation: Secondary | ICD-10-CM | POA: Diagnosis not present

## 2016-09-26 DIAGNOSIS — R069 Unspecified abnormalities of breathing: Secondary | ICD-10-CM | POA: Diagnosis not present

## 2016-09-26 DIAGNOSIS — J9600 Acute respiratory failure, unspecified whether with hypoxia or hypercapnia: Secondary | ICD-10-CM | POA: Diagnosis not present

## 2016-09-26 DIAGNOSIS — J189 Pneumonia, unspecified organism: Secondary | ICD-10-CM | POA: Diagnosis not present

## 2016-09-26 DIAGNOSIS — R062 Wheezing: Secondary | ICD-10-CM | POA: Diagnosis not present

## 2016-09-26 DIAGNOSIS — I482 Chronic atrial fibrillation: Secondary | ICD-10-CM | POA: Diagnosis not present

## 2016-09-27 DIAGNOSIS — M818 Other osteoporosis without current pathological fracture: Secondary | ICD-10-CM | POA: Diagnosis not present

## 2016-09-27 DIAGNOSIS — J449 Chronic obstructive pulmonary disease, unspecified: Secondary | ICD-10-CM | POA: Diagnosis not present

## 2016-09-27 DIAGNOSIS — J438 Other emphysema: Secondary | ICD-10-CM | POA: Diagnosis not present

## 2016-09-27 DIAGNOSIS — J441 Chronic obstructive pulmonary disease with (acute) exacerbation: Secondary | ICD-10-CM | POA: Diagnosis not present

## 2016-09-27 DIAGNOSIS — R32 Unspecified urinary incontinence: Secondary | ICD-10-CM | POA: Diagnosis not present

## 2016-09-28 DIAGNOSIS — E119 Type 2 diabetes mellitus without complications: Secondary | ICD-10-CM | POA: Diagnosis not present

## 2016-09-28 DIAGNOSIS — L270 Generalized skin eruption due to drugs and medicaments taken internally: Secondary | ICD-10-CM | POA: Diagnosis not present

## 2016-09-28 DIAGNOSIS — I2510 Atherosclerotic heart disease of native coronary artery without angina pectoris: Secondary | ICD-10-CM | POA: Diagnosis not present

## 2016-09-28 DIAGNOSIS — J441 Chronic obstructive pulmonary disease with (acute) exacerbation: Secondary | ICD-10-CM | POA: Diagnosis not present

## 2016-09-28 DIAGNOSIS — I10 Essential (primary) hypertension: Secondary | ICD-10-CM | POA: Diagnosis not present

## 2016-09-28 DIAGNOSIS — E7800 Pure hypercholesterolemia, unspecified: Secondary | ICD-10-CM | POA: Diagnosis not present

## 2016-09-28 DIAGNOSIS — R0902 Hypoxemia: Secondary | ICD-10-CM | POA: Diagnosis not present

## 2016-09-28 DIAGNOSIS — E871 Hypo-osmolality and hyponatremia: Secondary | ICD-10-CM | POA: Diagnosis not present

## 2016-09-28 DIAGNOSIS — I252 Old myocardial infarction: Secondary | ICD-10-CM | POA: Diagnosis not present

## 2016-09-28 DIAGNOSIS — R69 Illness, unspecified: Secondary | ICD-10-CM | POA: Diagnosis not present

## 2016-09-28 DIAGNOSIS — J9622 Acute and chronic respiratory failure with hypercapnia: Secondary | ICD-10-CM | POA: Diagnosis not present

## 2016-10-01 ENCOUNTER — Ambulatory Visit: Payer: Self-pay | Admitting: Family Medicine

## 2016-10-06 ENCOUNTER — Ambulatory Visit: Payer: Medicare HMO | Admitting: Podiatry

## 2016-10-07 ENCOUNTER — Other Ambulatory Visit: Payer: Self-pay | Admitting: Family Medicine

## 2016-10-07 NOTE — Telephone Encounter (Signed)
Seen 8 3 18 

## 2016-10-08 DIAGNOSIS — Z79899 Other long term (current) drug therapy: Secondary | ICD-10-CM | POA: Diagnosis not present

## 2016-10-08 DIAGNOSIS — Z87891 Personal history of nicotine dependence: Secondary | ICD-10-CM | POA: Diagnosis not present

## 2016-10-08 DIAGNOSIS — Z8709 Personal history of other diseases of the respiratory system: Secondary | ICD-10-CM | POA: Diagnosis not present

## 2016-10-08 DIAGNOSIS — R0602 Shortness of breath: Secondary | ICD-10-CM | POA: Diagnosis not present

## 2016-10-08 DIAGNOSIS — R0682 Tachypnea, not elsewhere classified: Secondary | ICD-10-CM | POA: Diagnosis not present

## 2016-10-08 DIAGNOSIS — Z7982 Long term (current) use of aspirin: Secondary | ICD-10-CM | POA: Diagnosis not present

## 2016-10-08 DIAGNOSIS — I482 Chronic atrial fibrillation: Secondary | ICD-10-CM | POA: Diagnosis not present

## 2016-10-08 DIAGNOSIS — R000 Tachycardia, unspecified: Secondary | ICD-10-CM | POA: Diagnosis not present

## 2016-10-08 DIAGNOSIS — R079 Chest pain, unspecified: Secondary | ICD-10-CM | POA: Diagnosis not present

## 2016-10-08 DIAGNOSIS — I10 Essential (primary) hypertension: Secondary | ICD-10-CM | POA: Diagnosis not present

## 2016-10-08 DIAGNOSIS — J441 Chronic obstructive pulmonary disease with (acute) exacerbation: Secondary | ICD-10-CM | POA: Diagnosis not present

## 2016-10-11 DIAGNOSIS — J9610 Chronic respiratory failure, unspecified whether with hypoxia or hypercapnia: Secondary | ICD-10-CM | POA: Diagnosis not present

## 2016-10-11 DIAGNOSIS — J449 Chronic obstructive pulmonary disease, unspecified: Secondary | ICD-10-CM | POA: Diagnosis not present

## 2016-10-17 ENCOUNTER — Emergency Department (HOSPITAL_COMMUNITY)
Admission: EM | Admit: 2016-10-17 | Discharge: 2016-10-17 | Disposition: A | Discharge status: Home / Self Care | Payer: Medicare HMO | Attending: Emergency Medicine | Admitting: Emergency Medicine

## 2016-10-17 ENCOUNTER — Emergency Department (HOSPITAL_COMMUNITY): Payer: Medicare HMO

## 2016-10-17 ENCOUNTER — Encounter (HOSPITAL_COMMUNITY): Payer: Self-pay | Admitting: Emergency Medicine

## 2016-10-17 DIAGNOSIS — Z79899 Other long term (current) drug therapy: Secondary | ICD-10-CM | POA: Insufficient documentation

## 2016-10-17 DIAGNOSIS — J45909 Unspecified asthma, uncomplicated: Secondary | ICD-10-CM | POA: Diagnosis not present

## 2016-10-17 DIAGNOSIS — I129 Hypertensive chronic kidney disease with stage 1 through stage 4 chronic kidney disease, or unspecified chronic kidney disease: Secondary | ICD-10-CM | POA: Diagnosis not present

## 2016-10-17 DIAGNOSIS — R05 Cough: Secondary | ICD-10-CM | POA: Diagnosis not present

## 2016-10-17 DIAGNOSIS — R0602 Shortness of breath: Secondary | ICD-10-CM | POA: Diagnosis not present

## 2016-10-17 DIAGNOSIS — Z87891 Personal history of nicotine dependence: Secondary | ICD-10-CM | POA: Insufficient documentation

## 2016-10-17 DIAGNOSIS — N189 Chronic kidney disease, unspecified: Secondary | ICD-10-CM | POA: Diagnosis not present

## 2016-10-17 DIAGNOSIS — E1122 Type 2 diabetes mellitus with diabetic chronic kidney disease: Secondary | ICD-10-CM | POA: Insufficient documentation

## 2016-10-17 DIAGNOSIS — R069 Unspecified abnormalities of breathing: Secondary | ICD-10-CM | POA: Diagnosis not present

## 2016-10-17 DIAGNOSIS — E1165 Type 2 diabetes mellitus with hyperglycemia: Secondary | ICD-10-CM | POA: Diagnosis not present

## 2016-10-17 DIAGNOSIS — J441 Chronic obstructive pulmonary disease with (acute) exacerbation: Secondary | ICD-10-CM | POA: Diagnosis not present

## 2016-10-17 DIAGNOSIS — I2510 Atherosclerotic heart disease of native coronary artery without angina pectoris: Secondary | ICD-10-CM | POA: Insufficient documentation

## 2016-10-17 DIAGNOSIS — Z7982 Long term (current) use of aspirin: Secondary | ICD-10-CM | POA: Diagnosis not present

## 2016-10-17 DIAGNOSIS — I252 Old myocardial infarction: Secondary | ICD-10-CM | POA: Insufficient documentation

## 2016-10-17 LAB — BASIC METABOLIC PANEL
ANION GAP: 7 (ref 5–15)
BUN: 11 mg/dL (ref 6–20)
CHLORIDE: 90 mmol/L — AB (ref 101–111)
CO2: 39 mmol/L — AB (ref 22–32)
CREATININE: 1.05 mg/dL — AB (ref 0.44–1.00)
Calcium: 10.4 mg/dL — ABNORMAL HIGH (ref 8.9–10.3)
GFR calc non Af Amer: 52 mL/min — ABNORMAL LOW (ref >60)
Glucose, Bld: 112 mg/dL — ABNORMAL HIGH (ref 65–99)
Potassium: 4.1 mmol/L (ref 3.5–5.1)
Sodium: 136 mmol/L (ref 135–145)

## 2016-10-17 LAB — CBC
HCT: 31.9 % — ABNORMAL LOW (ref 36.0–46.0)
HEMOGLOBIN: 9.9 g/dL — AB (ref 12.0–15.0)
MCH: 28.5 pg (ref 26.0–34.0)
MCHC: 31 g/dL (ref 30.0–36.0)
MCV: 91.9 fL (ref 78.0–100.0)
Platelets: 230 10*3/uL (ref 150–400)
RBC: 3.47 MIL/uL — AB (ref 3.87–5.11)
RDW: 14.4 % (ref 11.5–15.5)
WBC: 5.7 10*3/uL (ref 4.0–10.5)

## 2016-10-17 MED ORDER — PREDNISONE 10 MG PO TABS: 40.0000 mg | ORAL_TABLET | Freq: Every day | ORAL | 0 refills | Status: DC

## 2016-10-17 MED ORDER — ALBUTEROL SULFATE (2.5 MG/3ML) 0.083% IN NEBU
5.0000 mg | INHALATION_SOLUTION | Freq: Once | RESPIRATORY_TRACT | Status: AC
  Administered 2016-10-17: 5 mg via RESPIRATORY_TRACT
  Filled 2016-10-17: qty 6

## 2016-10-17 MED ORDER — METHYLPREDNISOLONE SODIUM SUCC 125 MG IJ SOLR
125.0000 mg | Freq: Once | INTRAMUSCULAR | Status: AC
  Administered 2016-10-17: 125 mg via INTRAVENOUS
  Filled 2016-10-17: qty 2

## 2016-10-17 NOTE — Discharge Instructions (Signed)
Take prednisone as directed. Make appointment follow-up with your doctor. Continue to use the 3 L of oxygen. Return for any new or worse symptoms.

## 2016-10-17 NOTE — ED Triage Notes (Signed)
Pt reports shortness of breath increasing for the past 3 days with dry cough.  Lung sounds very diminished on auscultation.  Pt last used neb 30 mins pta.

## 2016-10-17 NOTE — ED Provider Notes (Signed)
[facility]-EMERGENCY DEPT Provider Note   CSN: [id number] Arrival date & time: 10/17/16  1648     History   Chief Complaint Chief Complaint  Patient presents with  . Shortness of Breath    HPI Sheila Pierce is a 71 y.o. female.  Patient brought in for shortness of breath. Patient states she's had increased work of breathing for the past 3 days and a dry cough. Patient is known to have very bad COPD. Currently not on steroids. Does have nebulizer treatments at home have not been helping. Patient normally on 3 L of oxygen at all times. EMS did not given nebulizer treatment or steroids. Patient received nebulizer treatments shortly after upon arrival here.      Past Medical History:  Diagnosis Date  . Allergy   . Anemia   . Anxiety   . Arthritis   . Asthma   . Cataract   . Chronic kidney disease   . COPD (chronic obstructive pulmonary disease) (HCC)    Chronic resp failure on home O2  . Depression   . Diabetes mellitus without complication (HCC)   . Emphysema of lung (HCC)   . GERD (gastroesophageal reflux disease)   . Hypercholesteremia 12/16/2015  . Hyperglycemia    Noted 09/2011 admission in setting of steroid use with normal HgbA1C.  Marland Kitchen Hypertension   . Hypokalemia   . Hyponatremia    Noted 09/2011 admission.  . NSTEMI (non-ST elevated myocardial infarction) (HCC)    a. 09/2011 in setting of acute on chronic resp failure/COPD exacerbation --> cath demonstrated nonobstructive CAD 10/05/11 with EF 50%;  08/2012 elevated Ti in setting of COPD flare -->Echo: EF 60-65%, Gr 1 DD -->Med Rx.  . Osteoporosis   . Oxygen deficiency   . QT prolongation    Noted on EKG 09/2011 (590 in setting of K of 3, improved to 475 by discharge)  . Tobacco abuse 09/19/2012  . Trigeminal neuralgia     Patient Active Problem List   Diagnosis Date Noted  . Pressure injury of skin 09/15/2016  . COPD (chronic obstructive pulmonary disease) (HCC) 12/19/2015  . Acute on chronic respiratory failure with  hypoxia (HCC) 12/19/2015  . Hypercholesteremia 12/16/2015  . Osteoporosis 12/01/2015  . Colonoscopy refused 12/01/2015  . Macular degeneration, age related, nonexudative 11/26/2015  . Cataract incipient, senile, bilateral 11/26/2015  . Onychomycosis of toenail 11/11/2015  . Aortic atherosclerosis (HCC) 11/11/2015  . Abnormal thyroid function test 11/06/2015  . Diabetes mellitus, stable (HCC) 11/05/2015  . COPD exacerbation (HCC) 11/04/2015  . AKI (acute kidney injury) (HCC) 11/03/2015  . Normocytic anemia 11/03/2015  . Anxiety 11/03/2015  . Acute exacerbation of chronic obstructive pulmonary disease (COPD) (HCC) 09/19/2012  . CAD (coronary artery disease) 11/24/2011  . Essential hypertension   . QT prolongation   . MI, acute, non ST segment elevation (HCC) 10/05/2011    Past Surgical History:  Procedure Laterality Date  . ABDOMINAL HYSTERECTOMY     bleeding  . CHOLECYSTECTOMY    . LEFT HEART CATHETERIZATION WITH CORONARY ANGIOGRAM N/A 10/05/2011   Procedure: LEFT HEART CATHETERIZATION WITH CORONARY ANGIOGRAM;  Surgeon: Tonny Bollman, MD;  Location: [facility] CATH LAB;  Service: Cardiovascular;  Laterality: N/A;  . PLANTAR'S WART EXCISION      OB History    No data available       Home Medications    Prior to Admission medications   Medication Sig Start Date End Date Taking? Authorizing Provider  acetaZOLAMIDE (DIAMOX) 250 MG tablet Take 250  mg by mouth 2 (two) times daily. 08/26/16   [provider]  aspirin EC 81 MG tablet Take 81 mg by mouth daily.    [provider]  BREO ELLIPTA 100-25 MCG/INH AEPB Inhale 1 puff into the lungs daily. 11/11/15   [name], MD  busPIRone (BUSPAR) 15 MG tablet TAKE 1 TABLET BY MOUTH TWICE DAILY 05/31/16   [name], MD  cholecalciferol (VITAMIN D) 1000 units tablet Take 1,000 Units by mouth daily.    [provider]  doxycycline (VIBRA-TABS) 100 MG tablet Take 1 tablet (100 mg total) by mouth every 12  (twelve) hours. Patient not taking: Reported on 10/17/2016 09/16/16   Erick Blinks, MD  guaiFENesin (MUCINEX) 600 MG 12 hr tablet Take 1 tablet (600 mg total) by mouth 2 (two) times daily. 09/16/16   Erick Blinks, MD  ipratropium-albuterol (DUONEB) 0.5-2.5 (3) MG/3ML SOLN Take 3 mLs by nebulization every 4 (four) hours. 11/11/15   [name], MD  lisinopril-hydrochlorothiazide (PRINZIDE,ZESTORETIC) 10-12.5 MG tablet TAKE 1 TABLET BY MOUTH EVERY DAY 05/31/16   [name], MD  metoprolol tartrate (LOPRESSOR) 25 MG tablet TAKE 1 TABLET BY MOUTH TWICE DAILY 05/31/16   [name], MD  Multiple Vitamin (MULTIVITAMIN WITH MINERALS) TABS tablet Take 1 tablet by mouth daily.    [provider]  OXYGEN Inhale 3 L into the lungs continuous.     [provider]  potassium chloride (K-DUR) 10 MEQ tablet Take 1 tablet (10 mEq total) by mouth daily. 03/25/16   [name], MD  predniSONE (DELTASONE) 10 MG tablet Take 4 tablets (40 mg total) by mouth daily. 10/17/16   Vanetta Mulders, MD  tiotropium (SPIRIVA) 18 MCG inhalation capsule Place 18 mcg into inhaler and inhale daily.    [provider]  VENTOLIN HFA 108 (90 Base) MCG/ACT inhaler INHALE 2 PUFFS EVERY FOUR HOURS AS NEEDED FOR WHEEZING OR SHORTNESS OF BREATH 10/07/16   [name], MD    Family History Family History  Problem Relation Age of Onset  . Cancer Father        Lung  . Cancer Mother        Liver  . Hypertension Son   . Hyperlipidemia Son   . Post-traumatic stress disorder Son   . Depression Son   . Kidney disease Brother        liver and kidney failure  . Hypertension Son   . Hyperlipidemia Son   . Cancer Other        colon  . Diabetes Son   . Lung disease Daughter        passed away at 60 months old    Social History Social History  Substance Use Topics  . Smoking status: Former Smoker    Packs/day: 1.00    Years: 40.00    Types: Cigarettes    Start date:  01/26/1964    Quit date: 10/2014  . Smokeless tobacco: Never Used  . Alcohol use No     Comment: occasional     Allergies   Penicillins; Sulfa antibiotics; Codeine; and Hydrocodone   Review of Systems Review of Systems  Constitutional: Negative for fever.  HENT: Negative for congestion.   Eyes: Negative for redness.  Respiratory: Positive for cough and shortness of breath.   Cardiovascular: Negative for chest pain.  Gastrointestinal: Negative for abdominal pain.  Genitourinary: Negative for dysuria.  Musculoskeletal: Negative for back pain.  Neurological: Negative for headaches.  Hematological:  Does not bruise/bleed easily.  Psychiatric/Behavioral: Negative for confusion.     Physical Exam Updated Vital Signs BP (!) 152/74   Pulse 90   Temp 98.8 F (37.1 C) (Oral)   Resp 17   Ht 1.524 m (5')   Wt 63.5 kg (140 lb)   SpO2 99%   BMI 27.34 kg/m   Physical Exam  Constitutional: She is oriented to person, place, and time. She appears well-developed and well-nourished.  HENT:  Head: Normocephalic and atraumatic.  Eyes: Pupils are equal, round, and reactive to light. EOM are normal.  Neck: Neck supple.  Cardiovascular: Normal rate.   Pulmonary/Chest: She is in respiratory distress. She has no wheezes.  Abdominal: Soft. Bowel sounds are normal. There is no tenderness.  Musculoskeletal: Normal range of motion.  Neurological: She is alert and oriented to person, place, and time. No cranial nerve deficit or sensory deficit. She exhibits normal muscle tone. Coordination normal.  Skin: Skin is warm.  Nursing note and vitals reviewed.    ED Treatments / Results  Labs (all labs ordered are listed, but only abnormal results are displayed) Labs Reviewed  BASIC METABOLIC PANEL - Abnormal; Notable for the following:       Result Value   Chloride 90 (*)    CO2 39 (*)    Glucose, Bld 112 (*)    Creatinine, Ser 1.05 (*)    Calcium 10.4 (*)    GFR calc non Af Amer 52 (*)     All other components within normal limits  CBC - Abnormal; Notable for the following:    RBC 3.47 (*)    Hemoglobin 9.9 (*)    HCT 31.9 (*)    All other components within normal limits    EKG  EKG Interpretation  Date/Time:  Sunday October 17 2016 16:52:02 EDT Ventricular Rate:  94 PR Interval:    QRS Duration: 84 QT Interval:  338 QTC Calculation: 423 R Axis:   80 Text Interpretation:  Sinus rhythm Confirmed by Vanetta Mulders [phone]) on 10/17/2016 5:03:14 PM       Radiology Dg Chest 2 View  Result Date: 10/17/2016 CLINICAL DATA:  Shortness of breath worsening over the past 3 days with dry cough. EXAM: CHEST  2 VIEW COMPARISON:  10/08/2016, 09/14/2016 and 05/15/2016 FINDINGS: Lungs are adequately inflated without focal consolidation or effusion. Mild chronic interstitial changes over the mid to lower lungs. Flattened diaphragms on the lateral film. Cardiac silhouette is within normal. There is calcified plaque over the thoracic aorta. Remainder of the exam is unchanged. IMPRESSION: No acute cardiopulmonary disease. Aortic Atherosclerosis (ICD10-I70.0) and Emphysema (ICD10-J43.9). Electronically Signed   By: Elberta Fortis M.D.   On: 10/17/2016 17:33    Procedures Procedures (including critical care time)  Medications Ordered in ED Medications  albuterol (PROVENTIL) (2.5 MG/3ML) 0.083% nebulizer solution 5 mg (5 mg Nebulization Given 10/17/16 1656)  methylPREDNISolone sodium succinate (SOLU-MEDROL) 125 mg/2 mL injection 125 mg (125 mg Intravenous Given 10/17/16 1757)     Initial Impression / Assessment and Plan / ED Course  I have reviewed the triage vital signs and the nursing notes.  Pertinent labs & imaging results that were available during my care of the patient were reviewed by me and considered in my medical decision making (see chart for details).     Chest x-ray negative for pneumonia or pulmonary edema. Patient was significant improvement with nebulizer  treatment here 2 and also received Solu-Medrol. Patient really wants to go home she feels  she is back to baseline. Patient is essentially nonambulatory at home since most of her time in bed and has a bedside commode. This has been baseline for her for the past several months. Wheezing has resolved although we didn't hear any recent wheezing initially the patient states she feels that wheezing has resolved. Patient will be discharged home. Patient was ambulated some she did get short of breath and did drop her sats down to 90%. Patient insisted she still needed to go home. Patient will be continued on prednisone.  Final Clinical Impressions(s) / ED Diagnoses   Final diagnoses:  COPD exacerbation (HCC)    New Prescriptions New Prescriptions   PREDNISONE (DELTASONE) 10 MG TABLET    Take 4 tablets (40 mg total) by mouth daily.     Vanetta Mulders, MD 10/17/16 2019

## 2016-10-17 NOTE — ED Notes (Signed)
22 gauge IV removed from left hand- started by previous shift- cath intact upon removal.

## 2016-10-19 DIAGNOSIS — J438 Other emphysema: Secondary | ICD-10-CM | POA: Diagnosis not present

## 2016-10-19 DIAGNOSIS — J449 Chronic obstructive pulmonary disease, unspecified: Secondary | ICD-10-CM | POA: Diagnosis not present

## 2016-10-19 DIAGNOSIS — I2510 Atherosclerotic heart disease of native coronary artery without angina pectoris: Secondary | ICD-10-CM | POA: Diagnosis not present

## 2016-10-19 DIAGNOSIS — R32 Unspecified urinary incontinence: Secondary | ICD-10-CM | POA: Diagnosis not present

## 2016-10-19 DIAGNOSIS — J441 Chronic obstructive pulmonary disease with (acute) exacerbation: Secondary | ICD-10-CM | POA: Diagnosis not present

## 2016-10-19 DIAGNOSIS — I10 Essential (primary) hypertension: Secondary | ICD-10-CM | POA: Diagnosis not present

## 2016-10-19 DIAGNOSIS — J9621 Acute and chronic respiratory failure with hypoxia: Secondary | ICD-10-CM | POA: Diagnosis not present

## 2016-10-19 DIAGNOSIS — M818 Other osteoporosis without current pathological fracture: Secondary | ICD-10-CM | POA: Diagnosis not present

## 2016-10-26 DIAGNOSIS — R69 Illness, unspecified: Secondary | ICD-10-CM | POA: Diagnosis not present

## 2016-10-27 DIAGNOSIS — M818 Other osteoporosis without current pathological fracture: Secondary | ICD-10-CM | POA: Diagnosis not present

## 2016-10-27 DIAGNOSIS — J441 Chronic obstructive pulmonary disease with (acute) exacerbation: Secondary | ICD-10-CM | POA: Diagnosis not present

## 2016-10-27 DIAGNOSIS — J438 Other emphysema: Secondary | ICD-10-CM | POA: Diagnosis not present

## 2016-10-27 DIAGNOSIS — R32 Unspecified urinary incontinence: Secondary | ICD-10-CM | POA: Diagnosis not present

## 2016-10-27 DIAGNOSIS — J449 Chronic obstructive pulmonary disease, unspecified: Secondary | ICD-10-CM | POA: Diagnosis not present

## 2016-10-28 ENCOUNTER — Ambulatory Visit: Payer: Self-pay | Admitting: Family Medicine

## 2016-10-28 DIAGNOSIS — E119 Type 2 diabetes mellitus without complications: Secondary | ICD-10-CM | POA: Diagnosis not present

## 2016-10-29 LAB — CMP14+EGFR
ALK PHOS: 72 IU/L (ref 39–117)
ALT: 18 IU/L (ref 0–32)
AST: 20 IU/L (ref 0–40)
Albumin/Globulin Ratio: 1.9 (ref 1.2–2.2)
Albumin: 4 g/dL (ref 3.5–4.8)
BUN / CREAT RATIO: 12 (ref 12–28)
BUN: 15 mg/dL (ref 8–27)
Bilirubin Total: 0.2 mg/dL (ref 0.0–1.2)
CO2: 32 mmol/L — AB (ref 20–29)
CREATININE: 1.22 mg/dL — AB (ref 0.57–1.00)
Calcium: 10.5 mg/dL — ABNORMAL HIGH (ref 8.7–10.3)
Chloride: 97 mmol/L (ref 96–106)
GFR, EST AFRICAN AMERICAN: 51 mL/min/{1.73_m2} — AB (ref >59)
GFR, EST NON AFRICAN AMERICAN: 44 mL/min/{1.73_m2} — AB (ref >59)
GLOBULIN, TOTAL: 2.1 g/dL (ref 1.5–4.5)
GLUCOSE: 104 mg/dL — AB (ref 65–99)
Potassium: 4.7 mmol/L (ref 3.5–5.2)
SODIUM: 144 mmol/L (ref 134–144)
TOTAL PROTEIN: 6.1 g/dL (ref 6.0–8.5)

## 2016-10-30 ENCOUNTER — Emergency Department (HOSPITAL_COMMUNITY): Payer: Medicare HMO

## 2016-10-30 ENCOUNTER — Encounter (HOSPITAL_COMMUNITY): Payer: Self-pay | Admitting: Emergency Medicine

## 2016-10-30 ENCOUNTER — Inpatient Hospital Stay (HOSPITAL_COMMUNITY)
Admission: EM | Admit: 2016-10-30 | Discharge: 2016-11-02 | DRG: 190 | Disposition: A | Discharge status: Home Health | Payer: Medicare HMO | Attending: Internal Medicine | Admitting: Internal Medicine

## 2016-10-30 DIAGNOSIS — I10 Essential (primary) hypertension: Secondary | ICD-10-CM

## 2016-10-30 DIAGNOSIS — N182 Chronic kidney disease, stage 2 (mild): Secondary | ICD-10-CM | POA: Diagnosis present

## 2016-10-30 DIAGNOSIS — R0902 Hypoxemia: Secondary | ICD-10-CM | POA: Diagnosis not present

## 2016-10-30 DIAGNOSIS — Z841 Family history of disorders of kidney and ureter: Secondary | ICD-10-CM | POA: Diagnosis not present

## 2016-10-30 DIAGNOSIS — F419 Anxiety disorder, unspecified: Secondary | ICD-10-CM | POA: Diagnosis present

## 2016-10-30 DIAGNOSIS — I2510 Atherosclerotic heart disease of native coronary artery without angina pectoris: Secondary | ICD-10-CM | POA: Diagnosis present

## 2016-10-30 DIAGNOSIS — J439 Emphysema, unspecified: Principal | ICD-10-CM | POA: Diagnosis present

## 2016-10-30 DIAGNOSIS — I5032 Chronic diastolic (congestive) heart failure: Secondary | ICD-10-CM | POA: Diagnosis not present

## 2016-10-30 DIAGNOSIS — R05 Cough: Secondary | ICD-10-CM | POA: Diagnosis not present

## 2016-10-30 DIAGNOSIS — Z79899 Other long term (current) drug therapy: Secondary | ICD-10-CM

## 2016-10-30 DIAGNOSIS — R69 Illness, unspecified: Secondary | ICD-10-CM | POA: Diagnosis not present

## 2016-10-30 DIAGNOSIS — I130 Hypertensive heart and chronic kidney disease with heart failure and stage 1 through stage 4 chronic kidney disease, or unspecified chronic kidney disease: Secondary | ICD-10-CM | POA: Diagnosis present

## 2016-10-30 DIAGNOSIS — R0603 Acute respiratory distress: Secondary | ICD-10-CM

## 2016-10-30 DIAGNOSIS — I252 Old myocardial infarction: Secondary | ICD-10-CM

## 2016-10-30 DIAGNOSIS — Z87891 Personal history of nicotine dependence: Secondary | ICD-10-CM

## 2016-10-30 DIAGNOSIS — Z833 Family history of diabetes mellitus: Secondary | ICD-10-CM

## 2016-10-30 DIAGNOSIS — Z818 Family history of other mental and behavioral disorders: Secondary | ICD-10-CM

## 2016-10-30 DIAGNOSIS — D649 Anemia, unspecified: Secondary | ICD-10-CM | POA: Diagnosis present

## 2016-10-30 DIAGNOSIS — Z9981 Dependence on supplemental oxygen: Secondary | ICD-10-CM | POA: Diagnosis not present

## 2016-10-30 DIAGNOSIS — J441 Chronic obstructive pulmonary disease with (acute) exacerbation: Secondary | ICD-10-CM | POA: Diagnosis not present

## 2016-10-30 DIAGNOSIS — E7800 Pure hypercholesterolemia, unspecified: Secondary | ICD-10-CM | POA: Diagnosis present

## 2016-10-30 DIAGNOSIS — Z800 Family history of malignant neoplasm of digestive organs: Secondary | ICD-10-CM

## 2016-10-30 DIAGNOSIS — E119 Type 2 diabetes mellitus without complications: Secondary | ICD-10-CM

## 2016-10-30 DIAGNOSIS — Z880 Allergy status to penicillin: Secondary | ICD-10-CM

## 2016-10-30 DIAGNOSIS — D509 Iron deficiency anemia, unspecified: Secondary | ICD-10-CM

## 2016-10-30 DIAGNOSIS — Z885 Allergy status to narcotic agent status: Secondary | ICD-10-CM

## 2016-10-30 DIAGNOSIS — R000 Tachycardia, unspecified: Secondary | ICD-10-CM | POA: Diagnosis present

## 2016-10-30 DIAGNOSIS — Z9049 Acquired absence of other specified parts of digestive tract: Secondary | ICD-10-CM

## 2016-10-30 DIAGNOSIS — Z801 Family history of malignant neoplasm of trachea, bronchus and lung: Secondary | ICD-10-CM | POA: Diagnosis not present

## 2016-10-30 DIAGNOSIS — I700 Atherosclerosis of aorta: Secondary | ICD-10-CM | POA: Diagnosis present

## 2016-10-30 DIAGNOSIS — Z90710 Acquired absence of both cervix and uterus: Secondary | ICD-10-CM

## 2016-10-30 DIAGNOSIS — Z66 Do not resuscitate: Secondary | ICD-10-CM | POA: Diagnosis present

## 2016-10-30 DIAGNOSIS — M810 Age-related osteoporosis without current pathological fracture: Secondary | ICD-10-CM | POA: Diagnosis present

## 2016-10-30 DIAGNOSIS — J9621 Acute and chronic respiratory failure with hypoxia: Secondary | ICD-10-CM | POA: Diagnosis not present

## 2016-10-30 DIAGNOSIS — Z8249 Family history of ischemic heart disease and other diseases of the circulatory system: Secondary | ICD-10-CM

## 2016-10-30 DIAGNOSIS — E1122 Type 2 diabetes mellitus with diabetic chronic kidney disease: Secondary | ICD-10-CM | POA: Diagnosis present

## 2016-10-30 DIAGNOSIS — K219 Gastro-esophageal reflux disease without esophagitis: Secondary | ICD-10-CM | POA: Diagnosis present

## 2016-10-30 DIAGNOSIS — Z7982 Long term (current) use of aspirin: Secondary | ICD-10-CM

## 2016-10-30 DIAGNOSIS — R0682 Tachypnea, not elsewhere classified: Secondary | ICD-10-CM | POA: Diagnosis not present

## 2016-10-30 DIAGNOSIS — Z882 Allergy status to sulfonamides status: Secondary | ICD-10-CM

## 2016-10-30 DIAGNOSIS — Z7952 Long term (current) use of systemic steroids: Secondary | ICD-10-CM

## 2016-10-30 HISTORY — DX: Chronic diastolic (congestive) heart failure: I50.32

## 2016-10-30 LAB — CBC WITH DIFFERENTIAL/PLATELET
Basophils Absolute: 0 10*3/uL (ref 0.0–0.1)
Basophils Relative: 0 %
EOS PCT: 1 %
Eosinophils Absolute: 0.1 10*3/uL (ref 0.0–0.7)
HEMATOCRIT: 31.4 % — AB (ref 36.0–46.0)
HEMOGLOBIN: 9.7 g/dL — AB (ref 12.0–15.0)
LYMPHS PCT: 5 %
Lymphs Abs: 0.5 10*3/uL — ABNORMAL LOW (ref 0.7–4.0)
MCH: 28.4 pg (ref 26.0–34.0)
MCHC: 30.9 g/dL (ref 30.0–36.0)
MCV: 92.1 fL (ref 78.0–100.0)
Monocytes Absolute: 0.3 10*3/uL (ref 0.1–1.0)
Monocytes Relative: 3 %
Neutro Abs: 7.9 10*3/uL — ABNORMAL HIGH (ref 1.7–7.7)
Neutrophils Relative %: 91 %
Platelets: 247 10*3/uL (ref 150–400)
RBC: 3.41 MIL/uL — AB (ref 3.87–5.11)
RDW: 14.4 % (ref 11.5–15.5)
WBC: 8.8 10*3/uL (ref 4.0–10.5)

## 2016-10-30 LAB — COMPREHENSIVE METABOLIC PANEL
ALT: 21 U/L (ref 14–54)
ANION GAP: 10 (ref 5–15)
AST: 22 U/L (ref 15–41)
Albumin: 3.5 g/dL (ref 3.5–5.0)
Alkaline Phosphatase: 76 U/L (ref 38–126)
BILIRUBIN TOTAL: 0.4 mg/dL (ref 0.3–1.2)
BUN: 16 mg/dL (ref 6–20)
CO2: 35 mmol/L — ABNORMAL HIGH (ref 22–32)
Calcium: 9.7 mg/dL (ref 8.9–10.3)
Chloride: 95 mmol/L — ABNORMAL LOW (ref 101–111)
Creatinine, Ser: 1.34 mg/dL — ABNORMAL HIGH (ref 0.44–1.00)
GFR calc Af Amer: 45 mL/min — ABNORMAL LOW (ref >60)
GFR, EST NON AFRICAN AMERICAN: 39 mL/min — AB (ref >60)
Glucose, Bld: 134 mg/dL — ABNORMAL HIGH (ref 65–99)
POTASSIUM: 3.9 mmol/L (ref 3.5–5.1)
Sodium: 140 mmol/L (ref 135–145)
TOTAL PROTEIN: 6.8 g/dL (ref 6.5–8.1)

## 2016-10-30 LAB — TROPONIN I: Troponin I: 0.04 ng/mL (ref <0.03)

## 2016-10-30 MED ORDER — ALBUTEROL (5 MG/ML) CONTINUOUS INHALATION SOLN
10.0000 mg/h | INHALATION_SOLUTION | RESPIRATORY_TRACT | Status: DC
Start: 2016-10-30 — End: 2016-10-30
  Administered 2016-10-30: 10 mg/h via RESPIRATORY_TRACT
  Filled 2016-10-30: qty 20

## 2016-10-30 MED ORDER — SODIUM CHLORIDE 0.9% FLUSH
3.0000 mL | Freq: Two times a day (BID) | INTRAVENOUS | Status: DC
  Administered 2016-11-01 – 2016-11-02 (×4): 3 mL via INTRAVENOUS

## 2016-10-30 MED ORDER — VITAMIN D 1000 UNITS PO TABS
1000.0000 [IU] | ORAL_TABLET | Freq: Every day | ORAL | Status: DC
  Administered 2016-10-31 – 2016-11-02 (×3): 1000 [IU] via ORAL
  Filled 2016-10-30 (×5): qty 1

## 2016-10-30 MED ORDER — SODIUM CHLORIDE 0.9% FLUSH: 3.0000 mL | INTRAVENOUS | Status: DC | PRN

## 2016-10-30 MED ORDER — HYDROCODONE-ACETAMINOPHEN 5-325 MG PO TABS
1.0000 | ORAL_TABLET | ORAL | Status: DC | PRN
  Administered 2016-10-31: 1 via ORAL
  Administered 2016-11-01 (×2): 2 via ORAL
  Filled 2016-10-30: qty 1
  Filled 2016-10-30 (×2): qty 2

## 2016-10-30 MED ORDER — IPRATROPIUM BROMIDE 0.02 % IN SOLN
0.5000 mg | RESPIRATORY_TRACT | Status: AC
  Administered 2016-10-30: 0.5 mg via RESPIRATORY_TRACT
  Filled 2016-10-30: qty 2.5

## 2016-10-30 MED ORDER — HEPARIN SODIUM (PORCINE) 5000 UNIT/ML IJ SOLN
5000.0000 [IU] | Freq: Three times a day (TID) | INTRAMUSCULAR | Status: DC
  Administered 2016-10-31 – 2016-11-02 (×7): 5000 [IU] via SUBCUTANEOUS
  Filled 2016-10-30 (×7): qty 1

## 2016-10-30 MED ORDER — METHYLPREDNISOLONE SODIUM SUCC 125 MG IJ SOLR
60.0000 mg | Freq: Four times a day (QID) | INTRAMUSCULAR | Status: DC
  Administered 2016-10-31 – 2016-11-02 (×10): 60 mg via INTRAVENOUS
  Filled 2016-10-30 (×10): qty 2

## 2016-10-30 MED ORDER — ACETAMINOPHEN 325 MG PO TABS
650.0000 mg | ORAL_TABLET | Freq: Four times a day (QID) | ORAL | Status: DC | PRN
  Administered 2016-10-31: 650 mg via ORAL
  Filled 2016-10-30: qty 2

## 2016-10-30 MED ORDER — SENNOSIDES-DOCUSATE SODIUM 8.6-50 MG PO TABS: 1.0000 | ORAL_TABLET | Freq: Every evening | ORAL | Status: DC | PRN

## 2016-10-30 MED ORDER — SODIUM CHLORIDE 0.9% FLUSH
3.0000 mL | Freq: Two times a day (BID) | INTRAVENOUS | Status: DC
  Administered 2016-10-31 – 2016-11-01 (×3): 3 mL via INTRAVENOUS

## 2016-10-30 MED ORDER — ONDANSETRON HCL 4 MG PO TABS: 4.0000 mg | ORAL_TABLET | Freq: Four times a day (QID) | ORAL | Status: DC | PRN

## 2016-10-30 MED ORDER — ASPIRIN EC 81 MG PO TBEC
81.0000 mg | DELAYED_RELEASE_TABLET | Freq: Every day | ORAL | Status: DC
  Administered 2016-10-31 – 2016-11-02 (×3): 81 mg via ORAL
  Filled 2016-10-30 (×3): qty 1

## 2016-10-30 MED ORDER — ADULT MULTIVITAMIN W/MINERALS CH
1.0000 | ORAL_TABLET | Freq: Every day | ORAL | Status: DC
  Administered 2016-10-31 – 2016-11-02 (×3): 1 via ORAL
  Filled 2016-10-30 (×3): qty 1

## 2016-10-30 MED ORDER — ONDANSETRON HCL 4 MG/2ML IJ SOLN: 4.0000 mg | Freq: Four times a day (QID) | INTRAMUSCULAR | Status: DC | PRN

## 2016-10-30 MED ORDER — DEXTROSE 5 % IV SOLN
2.0000 g | Freq: Once | INTRAVENOUS | Status: DC
  Administered 2016-10-30: 2 g via INTRAVENOUS
  Filled 2016-10-30: qty 2

## 2016-10-30 MED ORDER — ACETAZOLAMIDE 250 MG PO TABS
250.0000 mg | ORAL_TABLET | Freq: Two times a day (BID) | ORAL | Status: DC
  Administered 2016-10-31 – 2016-11-02 (×5): 250 mg via ORAL
  Filled 2016-10-30 (×8): qty 1

## 2016-10-30 MED ORDER — HYDROCHLOROTHIAZIDE 12.5 MG PO CAPS
12.5000 mg | ORAL_CAPSULE | Freq: Every day | ORAL | Status: DC
  Administered 2016-10-31 – 2016-11-02 (×3): 12.5 mg via ORAL
  Filled 2016-10-30 (×3): qty 1

## 2016-10-30 MED ORDER — BUSPIRONE HCL 5 MG PO TABS
15.0000 mg | ORAL_TABLET | Freq: Two times a day (BID) | ORAL | Status: DC
  Administered 2016-10-31 – 2016-11-02 (×5): 15 mg via ORAL
  Filled 2016-10-30 (×2): qty 1
  Filled 2016-10-30: qty 3
  Filled 2016-10-30 (×2): qty 1
  Filled 2016-10-30 (×4): qty 3

## 2016-10-30 MED ORDER — ACETAMINOPHEN 650 MG RE SUPP: 650.0000 mg | Freq: Four times a day (QID) | RECTAL | Status: DC | PRN

## 2016-10-30 MED ORDER — LISINOPRIL 10 MG PO TABS: 10.0000 mg | ORAL_TABLET | Freq: Every day | ORAL | Status: DC

## 2016-10-30 MED ORDER — SODIUM CHLORIDE 0.9 % IV SOLN: 250.0000 mL | INTRAVENOUS | Status: DC | PRN

## 2016-10-30 MED ORDER — METOPROLOL TARTRATE 25 MG PO TABS
25.0000 mg | ORAL_TABLET | Freq: Two times a day (BID) | ORAL | Status: DC
  Administered 2016-10-31 – 2016-11-02 (×5): 25 mg via ORAL
  Filled 2016-10-30 (×5): qty 1

## 2016-10-30 MED ORDER — GUAIFENESIN ER 600 MG PO TB12
600.0000 mg | ORAL_TABLET | Freq: Two times a day (BID) | ORAL | Status: DC
  Administered 2016-10-31 – 2016-11-02 (×5): 600 mg via ORAL
  Filled 2016-10-30 (×5): qty 1

## 2016-10-30 MED ORDER — IPRATROPIUM-ALBUTEROL 0.5-2.5 (3) MG/3ML IN SOLN
3.0000 mL | RESPIRATORY_TRACT | Status: DC
  Administered 2016-10-30 – 2016-10-31 (×2): 3 mL via RESPIRATORY_TRACT
  Filled 2016-10-30: qty 3

## 2016-10-30 MED ORDER — FLUTICASONE FUROATE-VILANTEROL 100-25 MCG/INH IN AEPB
1.0000 | INHALATION_SPRAY | Freq: Every day | RESPIRATORY_TRACT | Status: DC
  Administered 2016-10-31 – 2016-11-02 (×3): 1 via RESPIRATORY_TRACT
  Filled 2016-10-30: qty 28

## 2016-10-30 MED ORDER — POTASSIUM CHLORIDE CRYS ER 10 MEQ PO TBCR
10.0000 meq | EXTENDED_RELEASE_TABLET | Freq: Every day | ORAL | Status: DC
  Administered 2016-10-31 – 2016-11-02 (×3): 10 meq via ORAL
  Filled 2016-10-30 (×5): qty 1

## 2016-10-30 MED ORDER — DEXTROSE 5 % IV SOLN
500.0000 mg | INTRAVENOUS | Status: DC
  Administered 2016-10-31 – 2016-11-01 (×2): 500 mg via INTRAVENOUS
  Filled 2016-10-30 (×4): qty 500

## 2016-10-30 NOTE — H&P (Signed)
History and Physical    Sheila Pierce ZOX:[id number] DOB: 03-09-1945 DOA: 10/30/2016  PCP: [name], MD   Patient coming from: Home  Chief Complaint: SOB, cough, low sat on usual FiO2  HPI: Sheila Pierce is a 71 y.o. female with medical history significant for COPD with chronic 3 L/m supplemental oxygen requirement, now presented to the emergency department with hypoxia at home, increased shortness of breath, worsening cough, and wheezing. Patient reports that she had recently been treated with antibiotics for similar symptoms, improved transiently, but has begun to worsen again. She denies any lower extremity swelling or tenderness, denies chest pain, and denies fevers. She had increased her supplemental oxygen to 4 L/m at home, but continued to have saturations in the mid 80s. She called EMS for transport to the hospital and was treated with 125 mg of Solu-Medrol and placed on CPAP prior to arrival.   ED Course: Upon arrival to the ED, patient is found to be afebrile, saturating adequately with BiPAP and 4 L/m supplemental oxygen, and with vitals otherwise stable. EKG features a sinus tachycardia with rate 108. Chest x-ray is notable for mild vascular congestion and mild basilar opacity. Chemistry panel features a serum creatinine 1.34, slightly up from her prior baseline. Troponin is slightly elevated to 0.04. CBC features a stable chronic normocytic anemia with hemoglobin of 9.7. Patient was kept on BiPAP in the ED, and treated with Atrovent and antibiotics. She remained hemodynamically stable, but in acute respiratory distress. She will be admitted to the stepdown unit for ongoing evaluation and management of acute on chronic hypoxic respiratory failure suspected secondary to exacerbation in COPD.  Review of Systems:  Unable to complete ROS secondary to patient's clinical condition with acute respiratory distress.  Past Medical History:  Diagnosis Date  . Allergy   . Anemia   .  Anxiety   . Arthritis   . Asthma   . Cataract   . Chronic diastolic CHF (congestive heart failure) (HCC) 10/30/2016  . Chronic kidney disease   . COPD (chronic obstructive pulmonary disease) (HCC)    Chronic resp failure on home O2  . Depression   . Diabetes mellitus without complication (HCC)   . Emphysema of lung (HCC)   . GERD (gastroesophageal reflux disease)   . Hypercholesteremia 12/16/2015  . Hyperglycemia    Noted 09/2011 admission in setting of steroid use with normal HgbA1C.  Marland Kitchen Hypertension   . Hypokalemia   . Hyponatremia    Noted 09/2011 admission.  . NSTEMI (non-ST elevated myocardial infarction) (HCC)    a. 09/2011 in setting of acute on chronic resp failure/COPD exacerbation --> cath demonstrated nonobstructive CAD 10/05/11 with EF 50%;  08/2012 elevated Ti in setting of COPD flare -->Echo: EF 60-65%, Gr 1 DD -->Med Rx.  . Osteoporosis   . Oxygen deficiency   . QT prolongation    Noted on EKG 09/2011 (590 in setting of K of 3, improved to 475 by discharge)  . Tobacco abuse 09/19/2012  . Trigeminal neuralgia     Past Surgical History:  Procedure Laterality Date  . ABDOMINAL HYSTERECTOMY     bleeding  . CHOLECYSTECTOMY    . LEFT HEART CATHETERIZATION WITH CORONARY ANGIOGRAM N/A 10/05/2011   Procedure: LEFT HEART CATHETERIZATION WITH CORONARY ANGIOGRAM;  Surgeon: Tonny Bollman, MD;  Location: [facility] CATH LAB;  Service: Cardiovascular;  Laterality: N/A;  . PLANTAR'S WART EXCISION       reports that she quit smoking about 2 years ago. Her  smoking use included Cigarettes. She started smoking about 52 years ago. She has a 40.00 pack-year smoking history. She has never used smokeless tobacco. She reports that she does not drink alcohol or use drugs.  Allergies  Allergen Reactions  . Penicillins Swelling and Other (See Comments)    Reaction:  Unspecified swelling reaction Has patient had a PCN reaction causing immediate rash, facial/tongue/throat swelling, SOB or  lightheadedness with hypotension: Yes Has patient had a PCN reaction causing severe rash involving mucus membranes or skin necrosis: No Has patient had a PCN reaction that required hospitalization No Has patient had a PCN reaction occurring within the last 10 years: Yes If all of the above answers are "NO", then may proceed with Cephalosporin use.  . Sulfa Antibiotics Hives and Other (See Comments)    Reaction:  Hallucinations   . Codeine Nausea Only  . Hydrocodone Nausea Only    Family History  Problem Relation Age of Onset  . Cancer Father        Lung  . Cancer Mother        Liver  . Hypertension Son   . Hyperlipidemia Son   . Post-traumatic stress disorder Son   . Depression Son   . Kidney disease Brother        liver and kidney failure  . Hypertension Son   . Hyperlipidemia Son   . Cancer Other        colon  . Diabetes Son   . Lung disease Daughter        passed away at 21 months old     Prior to Admission medications   Medication Sig Start Date End Date Taking? Authorizing Provider  acetaZOLAMIDE (DIAMOX) 250 MG tablet Take 250 mg by mouth 2 (two) times daily. 08/26/16   [provider]  aspirin EC 81 MG tablet Take 81 mg by mouth daily.    [provider]  BREO ELLIPTA 100-25 MCG/INH AEPB Inhale 1 puff into the lungs daily. 11/11/15   [name], MD  busPIRone (BUSPAR) 15 MG tablet TAKE 1 TABLET BY MOUTH TWICE DAILY 05/31/16   [name], MD  cholecalciferol (VITAMIN D) 1000 units tablet Take 1,000 Units by mouth daily.    [provider]  doxycycline (VIBRA-TABS) 100 MG tablet Take 1 tablet (100 mg total) by mouth every 12 (twelve) hours. Patient not taking: Reported on 10/17/2016 09/16/16   Erick Blinks, MD  guaiFENesin (MUCINEX) 600 MG 12 hr tablet Take 1 tablet (600 mg total) by mouth 2 (two) times daily. 09/16/16   Erick Blinks, MD  ipratropium-albuterol (DUONEB) 0.5-2.5 (3) MG/3ML SOLN Take 3 mLs by nebulization every 4  (four) hours. 11/11/15   [name], MD  lisinopril-hydrochlorothiazide (PRINZIDE,ZESTORETIC) 10-12.5 MG tablet TAKE 1 TABLET BY MOUTH EVERY DAY 05/31/16   [name], MD  metoprolol tartrate (LOPRESSOR) 25 MG tablet TAKE 1 TABLET BY MOUTH TWICE DAILY 05/31/16   [name], MD  Multiple Vitamin (MULTIVITAMIN WITH MINERALS) TABS tablet Take 1 tablet by mouth daily.    [provider]  OXYGEN Inhale 3 L into the lungs continuous.     [provider]  potassium chloride (K-DUR) 10 MEQ tablet Take 1 tablet (10 mEq total) by mouth daily. 03/25/16   [name], MD  predniSONE (DELTASONE) 10 MG tablet Take 4 tablets (40 mg total) by mouth daily. 10/17/16   Vanetta Mulders, MD  tiotropium (SPIRIVA) 18 MCG inhalation capsule Place 18 mcg into  inhaler and inhale daily.    [provider]  VENTOLIN HFA 108 (90 Base) MCG/ACT inhaler INHALE 2 PUFFS EVERY FOUR HOURS AS NEEDED FOR WHEEZING OR SHORTNESS OF BREATH 10/07/16   [name], MD    Physical Exam: Vitals:   10/30/16 1944 10/30/16 2005 10/30/16 2010 10/30/16 2018  BP: 127/78   (!) 104/54  Pulse: (!) 106   99  Resp: 20   19  Temp: 100.3 F (37.9 C)     TempSrc: Rectal     SpO2: 98% 99% 100% 100%  Weight:      Height:          Constitutional: Tachypnea, labored breathing.  No pallor, no diaphoresis Eyes: PERTLA, lids and conjunctivae normal ENMT: Mucous membranes are moist. Posterior pharynx clear of any exudate or lesions.   Neck: normal, supple, no masses, no thyromegaly Respiratory: Diminished bilaterally, wheezing throughout, long expiratory phase. Increased WOB.  Cardiovascular: S1 & S2 heard, regular rate and rhythm. No extremity edema. No significant JVD. Abdomen: No distension, no tenderness, no masses palpated. Bowel sounds normal.  Musculoskeletal: no clubbing / cyanosis. No joint deformity upper and lower extremities.   Skin: no significant rashes, lesions, ulcers. Warm,  dry, well-perfused. Neurologic: CN 2-12 grossly intact. Sensation intact. Strength 5/5 in all 4 limbs.  Psychiatric: Alert and oriented x 3. Calm, cooperative.     Labs on Admission: I have personally reviewed following labs and imaging studies  CBC:  Recent Labs Lab 10/30/16 2032  WBC 8.8  NEUTROABS 7.9*  HGB 9.7*  HCT 31.4*  MCV 92.1  PLT 247   Basic Metabolic Panel:  Recent Labs Lab 10/28/16 0915 10/30/16 2032  NA 144 140  K 4.7 3.9  CL 97 95*  CO2 32* 35*  GLUCOSE 104* 134*  BUN 15 16  CREATININE 1.22* 1.34*  CALCIUM 10.5* 9.7   GFR: Estimated Creatinine Clearance: 31.6 mL/min (A) (by C-G formula based on SCr of 1.34 mg/dL (H)). Liver Function Tests:  Recent Labs Lab 10/28/16 0915 10/30/16 2032  AST 20 22  ALT 18 21  ALKPHOS 72 76  BILITOT 0.2 0.4  PROT 6.1 6.8  ALBUMIN 4.0 3.5   No results for input(s): LIPASE, AMYLASE in the last 168 hours. No results for input(s): AMMONIA in the last 168 hours. Coagulation Profile: No results for input(s): INR, PROTIME in the last 168 hours. Cardiac Enzymes:  Recent Labs Lab 10/30/16 2032  TROPONINI 0.04*   BNP (last 3 results) No results for input(s): PROBNP in the last 8760 hours. HbA1C: No results for input(s): HGBA1C in the last 72 hours. CBG: No results for input(s): GLUCAP in the last 168 hours. Lipid Profile: No results for input(s): CHOL, HDL, LDLCALC, TRIG, CHOLHDL, LDLDIRECT in the last 72 hours. Thyroid Function Tests: No results for input(s): TSH, T4TOTAL, FREET4, T3FREE, THYROIDAB in the last 72 hours. Anemia Panel: No results for input(s): VITAMINB12, FOLATE, FERRITIN, TIBC, IRON, RETICCTPCT in the last 72 hours. Urine analysis:    Component Value Date/Time   COLORURINE YELLOW 04/16/2016 1920   APPEARANCEUR HAZY (A) 04/16/2016 1920   LABSPEC 1.006 04/16/2016 1920   PHURINE 6.0 04/16/2016 1920   GLUCOSEU NEGATIVE 04/16/2016 1920   HGBUR SMALL (A) 04/16/2016 1920   BILIRUBINUR  NEGATIVE 04/16/2016 1920   KETONESUR NEGATIVE 04/16/2016 1920   PROTEINUR NEGATIVE 04/16/2016 1920   NITRITE POSITIVE (A) 04/16/2016 1920   LEUKOCYTESUR LARGE (A) 04/16/2016 1920   Sepsis Labs: @LABRCNTIP (procalcitonin:4,lacticidven:4) )No results found for this  or any previous visit (from the past 240 hour(s)).   Radiological Exams on Admission: Dg Chest Port 1 View  Result Date: 10/30/2016 CLINICAL DATA:  Acute onset of cough, wheezing and shortness of breath. Initial encounter. EXAM: PORTABLE CHEST 1 VIEW COMPARISON:  Chest radiograph performed 10/17/2016 FINDINGS: The lungs are well-aerated. Mild vascular congestion is noted. Mild bibasilar opacities raise concern for mild interstitial edema. There is no evidence of pleural effusion or pneumothorax. The cardiomediastinal silhouette is within normal limits. No acute osseous abnormalities are seen. IMPRESSION: Mild vascular congestion noted. Mild bibasilar airspace opacities raise concern for mild interstitial edema. Electronically Signed   By: Roanna Raider M.D.   On: 10/30/2016 20:42    EKG: Independently reviewed. Sinus tachycardia (rate 108).   Assessment/Plan  1. COPD exacerbation, acute on chronic hypoxic respiratory failure  - Pt presents with increasing dyspnea and cough, persistent hypoxia despite increasing her home O2 to 4 Lpm - She was treated with 125 mg IV Solu-Medrol prior to arrival - Requiring BiPAP in ED, where she was given nebs and started on abx  - Plan to continue BiPAP as needed, check sputum culture and continue abx, continue systemic steroid and nebs   2. Chronic diastolic CHF  - Pt appears euvolemic on admission  - SLIV, follow daily wts and I/O's    3. Hypertension   - BP at goal, continue losartan   4. Anemia - Hgb is 10.9 on admission  - Stable, and with no bleeding evident, will monitor   5. Mild renal insufficiency  - SCr is 1.34 on admission, only slightly up from apparent baseline   - Avoid  dehydration or hypotension, renally-dose medications as needed    DVT prophylaxis: sq heparin  Code Status: DNR  Family Communication: Discussed with patient Disposition Plan: Admit to SDU Consults called: None Admission status: Inpatient    Briscoe Deutscher, MD [facility] Pager [phone]  If 7PM-7AM, please contact night-coverage [email or web address] Password [email or web address]  10/30/2016, 9:43 PM  \

## 2016-10-30 NOTE — ED Triage Notes (Addendum)
Pt reports being seen here recently for same, given antibx and no improvement. Has been having SOB X3 days, non productive cough. On home concentrator pt SpO2 was 85% on 4L, on CPAP pt is 98%. 125mg  Solumedrol given in route.

## 2016-10-30 NOTE — Progress Notes (Signed)
Pt arrived via EMS on CPAP. Pt placed on [facility] Bipap. Tolerating well

## 2016-10-30 NOTE — ED Provider Notes (Signed)
[facility] DEPT Provider Note   CSN: [id number] Arrival date & time: 10/30/16  1936     History   Chief Complaint Chief Complaint  Patient presents with  . Shortness of Breath    HPI Sheila Pierce is a 71 y.o. female.  HPI  The patient is a 71 year old female, she is a known history of chronic COPD on chronic oxygen usually at 3 L. After several days of increasing shortness of breath she decided to come to the hospital by paramedic transport tonight. She was found to be hypoxic to 85% on 4 L and was placed on BiPAP. The patient states that is significantly improved her symptoms but had noted that she had had a progressive worsening shortness of breath with severe wheezing and respiratory distress. She denies chest pain, fevers or swelling of the legs but has had intermittent worsening coughing with purulent sputum production. The patient had recently been treated for similar symptoms and was admitted.  It is difficult to get any information from the patient as she is in respiratory distress requiring BiPAP, level V caveat applies due to the severity of her condition  Past Medical History:  Diagnosis Date  . Allergy   . Anemia   . Anxiety   . Arthritis   . Asthma   . Cataract   . Chronic diastolic CHF (congestive heart failure) (HCC) 10/30/2016  . Chronic kidney disease   . COPD (chronic obstructive pulmonary disease) (HCC)    Chronic resp failure on home O2  . Depression   . Diabetes mellitus without complication (HCC)   . Emphysema of lung (HCC)   . GERD (gastroesophageal reflux disease)   . Hypercholesteremia 12/16/2015  . Hyperglycemia    Noted 09/2011 admission in setting of steroid use with normal HgbA1C.  Marland Kitchen Hypertension   . Hypokalemia   . Hyponatremia    Noted 09/2011 admission.  . NSTEMI (non-ST elevated myocardial infarction) (HCC)    a. 09/2011 in setting of acute on chronic resp failure/COPD exacerbation --> cath demonstrated nonobstructive CAD 10/05/11 with  EF 50%;  08/2012 elevated Ti in setting of COPD flare -->Echo: EF 60-65%, Gr 1 DD -->Med Rx.  . Osteoporosis   . Oxygen deficiency   . QT prolongation    Noted on EKG 09/2011 (590 in setting of K of 3, improved to 475 by discharge)  . Tobacco abuse 09/19/2012  . Trigeminal neuralgia     Patient Active Problem List   Diagnosis Date Noted  . Chronic diastolic CHF (congestive heart failure) (HCC) 10/30/2016  . CKD (chronic kidney disease), stage II 10/30/2016  . Pressure injury of skin 09/15/2016  . COPD (chronic obstructive pulmonary disease) (HCC) 12/19/2015  . Acute on chronic respiratory failure with hypoxia (HCC) 12/19/2015  . Hypercholesteremia 12/16/2015  . Osteoporosis 12/01/2015  . Colonoscopy refused 12/01/2015  . Macular degeneration, age related, nonexudative 11/26/2015  . Cataract incipient, senile, bilateral 11/26/2015  . Onychomycosis of toenail 11/11/2015  . Aortic atherosclerosis (HCC) 11/11/2015  . Abnormal thyroid function test 11/06/2015  . Diabetes mellitus, stable (HCC) 11/05/2015  . COPD exacerbation (HCC) 11/04/2015  . AKI (acute kidney injury) (HCC) 11/03/2015  . Anemia 11/03/2015  . Anxiety 11/03/2015  . Acute exacerbation of chronic obstructive pulmonary disease (COPD) (HCC) 09/19/2012  . CAD (coronary artery disease) 11/24/2011  . Essential hypertension   . MI, acute, non ST segment elevation (HCC) 10/05/2011    Past Surgical History:  Procedure Laterality Date  . ABDOMINAL HYSTERECTOMY  bleeding  . CHOLECYSTECTOMY    . LEFT HEART CATHETERIZATION WITH CORONARY ANGIOGRAM N/A 10/05/2011   Procedure: LEFT HEART CATHETERIZATION WITH CORONARY ANGIOGRAM;  Surgeon: Tonny Bollman, MD;  Location: [facility] CATH LAB;  Service: Cardiovascular;  Laterality: N/A;  . PLANTAR'S WART EXCISION      OB History    No data available       Home Medications    Prior to Admission medications   Medication Sig Start Date End Date Taking? Authorizing Provider    acetaZOLAMIDE (DIAMOX) 250 MG tablet Take 250 mg by mouth 2 (two) times daily. 08/26/16   [provider]  aspirin EC 81 MG tablet Take 81 mg by mouth daily.    [provider]  BREO ELLIPTA 100-25 MCG/INH AEPB Inhale 1 puff into the lungs daily. 11/11/15   [name], MD  busPIRone (BUSPAR) 15 MG tablet TAKE 1 TABLET BY MOUTH TWICE DAILY 05/31/16   [name], MD  cholecalciferol (VITAMIN D) 1000 units tablet Take 1,000 Units by mouth daily.    [provider]  doxycycline (VIBRA-TABS) 100 MG tablet Take 1 tablet (100 mg total) by mouth every 12 (twelve) hours. Patient not taking: Reported on 10/17/2016 09/16/16   Erick Blinks, MD  guaiFENesin (MUCINEX) 600 MG 12 hr tablet Take 1 tablet (600 mg total) by mouth 2 (two) times daily. 09/16/16   Erick Blinks, MD  ipratropium-albuterol (DUONEB) 0.5-2.5 (3) MG/3ML SOLN Take 3 mLs by nebulization every 4 (four) hours. 11/11/15   [name], MD  lisinopril-hydrochlorothiazide (PRINZIDE,ZESTORETIC) 10-12.5 MG tablet TAKE 1 TABLET BY MOUTH EVERY DAY 05/31/16   [name], MD  metoprolol tartrate (LOPRESSOR) 25 MG tablet TAKE 1 TABLET BY MOUTH TWICE DAILY 05/31/16   [name], MD  Multiple Vitamin (MULTIVITAMIN WITH MINERALS) TABS tablet Take 1 tablet by mouth daily.    [provider]  OXYGEN Inhale 3 L into the lungs continuous.     [provider]  potassium chloride (K-DUR) 10 MEQ tablet Take 1 tablet (10 mEq total) by mouth daily. 03/25/16   [name], MD  predniSONE (DELTASONE) 10 MG tablet Take 4 tablets (40 mg total) by mouth daily. 10/17/16   Vanetta Mulders, MD  tiotropium (SPIRIVA) 18 MCG inhalation capsule Place 18 mcg into inhaler and inhale daily.    [provider]  VENTOLIN HFA 108 (90 Base) MCG/ACT inhaler INHALE 2 PUFFS EVERY FOUR HOURS AS NEEDED FOR WHEEZING OR SHORTNESS OF BREATH 10/07/16   [name], MD    Family History Family  History  Problem Relation Age of Onset  . Cancer Father        Lung  . Cancer Mother        Liver  . Hypertension Son   . Hyperlipidemia Son   . Post-traumatic stress disorder Son   . Depression Son   . Kidney disease Brother        liver and kidney failure  . Hypertension Son   . Hyperlipidemia Son   . Cancer Other        colon  . Diabetes Son   . Lung disease Daughter        passed away at 29 months old   Social History Social History  Substance Use Topics  . Smoking status: Former Smoker    Packs/day: 1.00    Years: 40.00    Types: Cigarettes    Start date: 01/26/1964    Quit date: 10/2014  .  Smokeless tobacco: Never Used  . Alcohol use No     Comment: occasional    Allergies   Penicillins; Sulfa antibiotics; Codeine; and Hydrocodone  Review of Systems Review of Systems  Unable to perform ROS: Acuity of condition     Physical Exam Updated Vital Signs BP 113/62 (BP Location: Left Arm)   Pulse 94   Temp 100.3 F (37.9 C) (Rectal)   Resp (!) 28   Ht 5' (1.524 m)   Wt 63.5 kg (140 lb)   SpO2 98%   BMI 27.34 kg/m   Physical Exam  Constitutional: She appears well-developed and well-nourished. She appears distressed.  HENT:  Head: Normocephalic and atraumatic.  Mouth/Throat: Oropharynx is clear and moist. No oropharyngeal exudate.  Eyes: Pupils are equal, round, and reactive to light. Conjunctivae and EOM are normal. Right eye exhibits no discharge. Left eye exhibits no discharge. No scleral icterus.  Neck: Normal range of motion. Neck supple. No JVD present. No thyromegaly present.  Cardiovascular: Regular rhythm, normal heart sounds and intact distal pulses.  Exam reveals no gallop and no friction rub.   No murmur heard. Tachycardia  Pulmonary/Chest: She is in respiratory distress. She has wheezes. She has no rales.  Pursed lip breathing, prolonged expiratory phase, speaks in 1-2 word sentences  Abdominal: Soft. Bowel sounds are normal. She exhibits no  distension and no mass. There is no tenderness.  Musculoskeletal: Normal range of motion. She exhibits no edema or tenderness.  Lymphadenopathy:    She has no cervical adenopathy.  Neurological: She is alert. Coordination normal.  Skin: Skin is warm and dry. No rash noted. No erythema.  Psychiatric: She has a normal mood and affect. Her behavior is normal.  Nursing note and vitals reviewed.    ED Treatments / Results  Labs (all labs ordered are listed, but only abnormal results are displayed) Labs Reviewed  COMPREHENSIVE METABOLIC PANEL - Abnormal; Notable for the following:       Result Value   Chloride 95 (*)    CO2 35 (*)    Glucose, Bld 134 (*)    Creatinine, Ser 1.34 (*)    GFR calc non Af Amer 39 (*)    GFR calc Af Amer 45 (*)    All other components within normal limits  CBC WITH DIFFERENTIAL/PLATELET - Abnormal; Notable for the following:    RBC 3.41 (*)    Hemoglobin 9.7 (*)    HCT 31.4 (*)    Neutro Abs 7.9 (*)    Lymphs Abs 0.5 (*)    All other components within normal limits  TROPONIN I - Abnormal; Notable for the following:    Troponin I 0.04 (*)    All other components within normal limits  CULTURE, EXPECTORATED SPUTUM-ASSESSMENT  BASIC METABOLIC PANEL  CBC WITH DIFFERENTIAL/PLATELET    EKG  EKG Interpretation  Date/Time:  Saturday October 30 2016 19:50:10 EDT Ventricular Rate:  108 PR Interval:    QRS Duration: 85 QT Interval:  325 QTC Calculation: 436 R Axis:   80 Text Interpretation:  Sinus tachycardia Probable anteroseptal infarct, old since last tracing no significant change Confirmed by Eber Hong ([id number]) on 10/30/2016 8:01:15 PM       Radiology Dg Chest Port 1 View  Result Date: 10/30/2016 CLINICAL DATA:  Acute onset of cough, wheezing and shortness of breath. Initial encounter. EXAM: PORTABLE CHEST 1 VIEW COMPARISON:  Chest radiograph performed 10/17/2016 FINDINGS: The lungs are well-aerated. Mild vascular congestion is noted. Mild  bibasilar opacities  raise concern for mild interstitial edema. There is no evidence of pleural effusion or pneumothorax. The cardiomediastinal silhouette is within normal limits. No acute osseous abnormalities are seen. IMPRESSION: Mild vascular congestion noted. Mild bibasilar airspace opacities raise concern for mild interstitial edema. Electronically Signed   By: Roanna Raider M.D.   On: 10/30/2016 20:42    Procedures .Critical Care Performed by: Eber Hong Authorized by: Eber Hong   Critical care provider statement:    Critical care time (minutes):  35   Critical care time was exclusive of:  Separately billable procedures and treating other patients and teaching time   Critical care was necessary to treat or prevent imminent or life-threatening deterioration of the following conditions:  Respiratory failure   Critical care was time spent personally by me on the following activities:  Development of treatment plan with patient or surrogate, discussions with consultants, evaluation of patient's response to treatment, examination of patient, obtaining history from patient or surrogate, ordering and performing treatments and interventions, ordering and review of laboratory studies, ordering and review of radiographic studies, re-evaluation of patient's condition, pulse oximetry and review of old charts   (including critical care time)  Medications Ordered in ED Medications  acetaZOLAMIDE (DIAMOX) tablet 250 mg (not administered)  guaiFENesin (MUCINEX) 12 hr tablet 600 mg (not administered)  busPIRone (BUSPAR) tablet 15 mg (not administered)  lisinopril-hydrochlorothiazide (PRINZIDE,ZESTORETIC) 10-12.5 MG per tablet 1 tablet (not administered)  metoprolol tartrate (LOPRESSOR) tablet 25 mg (not administered)  aspirin EC tablet 81 mg (not administered)  cholecalciferol (VITAMIN D) tablet 1,000 Units (not administered)  potassium chloride (K-DUR) CR tablet 10 mEq (not administered)    fluticasone furoate-vilanterol (BREO ELLIPTA) 100-25 MCG/INH 1 puff (not administered)  ipratropium-albuterol (DUONEB) 0.5-2.5 (3) MG/3ML nebulizer solution 3 mL (not administered)  multivitamin with minerals tablet 1 tablet (not administered)  heparin injection 5,000 Units (not administered)  sodium chloride flush (NS) 0.9 % injection 3 mL (not administered)  sodium chloride flush (NS) 0.9 % injection 3 mL (not administered)  sodium chloride flush (NS) 0.9 % injection 3 mL (not administered)  0.9 %  sodium chloride infusion (not administered)  acetaminophen (TYLENOL) tablet 650 mg (not administered)    Or  acetaminophen (TYLENOL) suppository 650 mg (not administered)  HYDROcodone-acetaminophen (NORCO/VICODIN) 5-325 MG per tablet 1-2 tablet (not administered)  senna-docusate (Senokot-S) tablet 1 tablet (not administered)  ondansetron (ZOFRAN) tablet 4 mg (not administered)    Or  ondansetron (ZOFRAN) injection 4 mg (not administered)  methylPREDNISolone sodium succinate (SOLU-MEDROL) 125 mg/2 mL injection 60 mg (not administered)  azithromycin (ZITHROMAX) 500 mg in dextrose 5 % 250 mL IVPB (not administered)  ipratropium (ATROVENT) nebulizer solution 0.5 mg (0.5 mg Nebulization Given 10/30/16 2004)     Initial Impression / Assessment and Plan / ED Course  I have reviewed the triage vital signs and the nursing notes.  Pertinent labs & imaging results that were available during my care of the patient were reviewed by me and considered in my medical decision making (see chart for details).      The patient is in acute respiratory distress. I suspect this is related to severe and possibly worsening COPD though this could be underlying pneumonia or even a pneumothorax. X-ray, labs, BiPAP, the patient is improved on BiPAP with oxygen but is over 95% however off bipap she drops very quickly into the mid 80%.  The patient required ongoing BiPAP due to hypoxia when she would come off of  BiPAP. Continuous nebs but  had ongoing distress after that.  X-ray reviewed showing some interstitial edema but no obvious signs of infiltrates. No leukocytosis, anemia is present, renal dysfunction is also present.  Discussed care with the hospitalist Dr.Opyd He will admit the patient in the hospital. The patient will need a high level of care and ongoing bipap   Final Clinical Impressions(s) / ED Diagnoses   Final diagnoses:  Respiratory distress  COPD exacerbation (HCC)    New Prescriptions New Prescriptions   No medications on file     Eber Hong, MD 10/30/16 2213

## 2016-10-31 DIAGNOSIS — J9621 Acute and chronic respiratory failure with hypoxia: Secondary | ICD-10-CM

## 2016-10-31 DIAGNOSIS — J441 Chronic obstructive pulmonary disease with (acute) exacerbation: Secondary | ICD-10-CM

## 2016-10-31 LAB — CBC WITH DIFFERENTIAL/PLATELET
Basophils Absolute: 0 10*3/uL (ref 0.0–0.1)
Basophils Relative: 0 %
EOS ABS: 0 10*3/uL (ref 0.0–0.7)
Eosinophils Relative: 0 %
HCT: 31.5 % — ABNORMAL LOW (ref 36.0–46.0)
HEMOGLOBIN: 9.5 g/dL — AB (ref 12.0–15.0)
LYMPHS ABS: 0.3 10*3/uL — AB (ref 0.7–4.0)
LYMPHS PCT: 5 %
MCH: 28.2 pg (ref 26.0–34.0)
MCHC: 30.2 g/dL (ref 30.0–36.0)
MCV: 93.5 fL (ref 78.0–100.0)
MONOS PCT: 1 %
Monocytes Absolute: 0.1 10*3/uL (ref 0.1–1.0)
NEUTROS PCT: 94 %
Neutro Abs: 6.8 10*3/uL (ref 1.7–7.7)
Platelets: 239 10*3/uL (ref 150–400)
RBC: 3.37 MIL/uL — ABNORMAL LOW (ref 3.87–5.11)
RDW: 14.3 % (ref 11.5–15.5)
WBC: 7.3 10*3/uL (ref 4.0–10.5)

## 2016-10-31 LAB — BASIC METABOLIC PANEL
Anion gap: 10 (ref 5–15)
BUN: 18 mg/dL (ref 6–20)
CALCIUM: 9.3 mg/dL (ref 8.9–10.3)
CO2: 33 mmol/L — AB (ref 22–32)
CREATININE: 1.33 mg/dL — AB (ref 0.44–1.00)
Chloride: 94 mmol/L — ABNORMAL LOW (ref 101–111)
GFR calc non Af Amer: 39 mL/min — ABNORMAL LOW (ref >60)
GFR, EST AFRICAN AMERICAN: 45 mL/min — AB (ref >60)
Glucose, Bld: 212 mg/dL — ABNORMAL HIGH (ref 65–99)
Potassium: 3.9 mmol/L (ref 3.5–5.1)
Sodium: 137 mmol/L (ref 135–145)

## 2016-10-31 LAB — GLUCOSE, CAPILLARY: Glucose-Capillary: 183 mg/dL — ABNORMAL HIGH (ref 65–99)

## 2016-10-31 LAB — TROPONIN I: TROPONIN I: 0.06 ng/mL — AB (ref <0.03)

## 2016-10-31 LAB — MRSA PCR SCREENING: MRSA by PCR: NEGATIVE

## 2016-10-31 MED ORDER — IPRATROPIUM-ALBUTEROL 0.5-2.5 (3) MG/3ML IN SOLN
3.0000 mL | RESPIRATORY_TRACT | Status: DC
  Administered 2016-10-31 – 2016-11-02 (×12): 3 mL via RESPIRATORY_TRACT
  Filled 2016-10-31 (×12): qty 3

## 2016-10-31 MED ORDER — SODIUM CHLORIDE 0.9 % IV BOLUS (SEPSIS)
500.0000 mL | Freq: Once | INTRAVENOUS | Status: AC
  Administered 2016-10-31: 500 mL via INTRAVENOUS

## 2016-10-31 NOTE — Plan of Care (Signed)
Problem: Respiratory: Goal: Ability to achieve and maintain a regular respiratory rate will improve Outcome: Progressing Patient respiratory rate will be maintained while wearing nasal cannula.

## 2016-10-31 NOTE — Progress Notes (Signed)
PROGRESS NOTE    Sheila Pierce  BJY:[id number] DOB: 1945/07/16 DOA: 10/30/2016 PCP: [name], MD     Brief Narrative:  71 year old woman admitted from home on 10/6 due to shortness of breath and cough. She has a history of COPD on 3 L of chronic oxygen. Was thought to have acute COPD exacerbation and admission was requested.   Assessment & Plan:   Principal Problem:   COPD exacerbation (HCC) Active Problems:   Essential hypertension   CAD (coronary artery disease)   Anemia   Anxiety   Diabetes mellitus, stable (HCC)   Acute on chronic respiratory failure with hypoxia (HCC)   Chronic diastolic CHF (congestive heart failure) (HCC)   CKD (chronic kidney disease), stage II   COPD with acute exacerbation, acute on chronic hypoxemic respiratory failure -Required BiPAP transiently, is now back to her baseline oxygen requirement. -Is not wheezing on exam with fair air movement, will plan on continuing steroids, empiric antibiotics and nebs for now.  Chronic diastolic CHF -Compensated  Hypertension -Continue losartan       DVT prophylaxis: Subcutaneous heparin Code Status: DO NOT RESUSCITATE Family Communication: Patient only Disposition Plan: Transfer to floor, anticipate discharge home in 24-48 hours  Consultants:   None  Procedures:   None  Antimicrobials:  Anti-infectives    Start     Dose/Rate Route Frequency Ordered Stop   10/30/16 2200  azithromycin (ZITHROMAX) 500 mg in dextrose 5 % 250 mL IVPB     500 mg 250 mL/hr over 60 Minutes Intravenous Every 24 hours 10/30/16 2158     10/30/16 2145  aztreonam (AZACTAM) 2 g in dextrose 5 % 50 mL IVPB  Status:  Discontinued     2 g 100 mL/hr over 30 Minutes Intravenous  Once 10/30/16 2131 10/30/16 2158       Subjective: Feels much better, states shortness of breath is much improved and she feels close to her baseline.  Objective: Vitals:   10/31/16 1251 10/31/16 1300 10/31/16 1427 10/31/16 1548    BP:  113/65 128/67   Pulse:  91 94   Resp:  17 18   Temp:   98.5 F (36.9 C)   TempSrc:   Oral   SpO2: 97% 100% 100% 99%  Weight:      Height:        Intake/Output Summary (Last 24 hours) at 10/31/16 1710 Last data filed at 10/31/16 0300  Gross per 24 hour  Intake                0 ml  Output              250 ml  Net             -250 ml   Filed Weights   10/30/16 1938 10/31/16 0500  Weight: 63.5 kg (140 lb) 65.3 kg (143 lb 15.4 oz)    Examination:  General exam: Alert, awake, oriented x 3 Respiratory system: Clear to auscultation. Respiratory effort normal. Cardiovascular system:RRR. No murmurs, rubs, gallops. Gastrointestinal system: Abdomen is nondistended, soft and nontender. No organomegaly or masses felt. Normal bowel sounds heard. Central nervous system: Alert and oriented. No focal neurological deficits. Extremities: No C/C/E, +pedal pulses Skin: No rashes, lesions or ulcers Psychiatry: Judgement and insight appear normal. Mood & affect appropriate.     Data Reviewed: I have personally reviewed following labs and imaging studies  CBC:  Recent Labs Lab 10/30/16 2032 10/31/16 0324  WBC 8.8 7.3  NEUTROABS 7.9* 6.8  HGB 9.7* 9.5*  HCT 31.4* 31.5*  MCV 92.1 93.5  PLT 247 239   Basic Metabolic Panel:  Recent Labs Lab 10/28/16 0915 10/30/16 2032 10/31/16 0324  NA 144 140 137  K 4.7 3.9 3.9  CL 97 95* 94*  CO2 32* 35* 33*  GLUCOSE 104* 134* 212*  BUN 15 16 18   CREATININE 1.22* 1.34* 1.33*  CALCIUM 10.5* 9.7 9.3   GFR: Estimated Creatinine Clearance: 32.2 mL/min (A) (by C-G formula based on SCr of 1.33 mg/dL (H)). Liver Function Tests:  Recent Labs Lab 10/28/16 0915 10/30/16 2032  AST 20 22  ALT 18 21  ALKPHOS 72 76  BILITOT 0.2 0.4  PROT 6.1 6.8  ALBUMIN 4.0 3.5   No results for input(s): LIPASE, AMYLASE in the last 168 hours. No results for input(s): AMMONIA in the last 168 hours. Coagulation Profile: No results for input(s): INR,  PROTIME in the last 168 hours. Cardiac Enzymes:  Recent Labs Lab 10/30/16 2032 10/31/16 0324  TROPONINI 0.04* 0.06*   BNP (last 3 results) No results for input(s): PROBNP in the last 8760 hours. HbA1C: No results for input(s): HGBA1C in the last 72 hours. CBG:  Recent Labs Lab 10/31/16 0759  GLUCAP 183*   Lipid Profile: No results for input(s): CHOL, HDL, LDLCALC, TRIG, CHOLHDL, LDLDIRECT in the last 72 hours. Thyroid Function Tests: No results for input(s): TSH, T4TOTAL, FREET4, T3FREE, THYROIDAB in the last 72 hours. Anemia Panel: No results for input(s): VITAMINB12, FOLATE, FERRITIN, TIBC, IRON, RETICCTPCT in the last 72 hours. Urine analysis:    Component Value Date/Time   COLORURINE YELLOW 04/16/2016 1920   APPEARANCEUR HAZY (A) 04/16/2016 1920   LABSPEC 1.006 04/16/2016 1920   PHURINE 6.0 04/16/2016 1920   GLUCOSEU NEGATIVE 04/16/2016 1920   HGBUR SMALL (A) 04/16/2016 1920   BILIRUBINUR NEGATIVE 04/16/2016 1920   KETONESUR NEGATIVE 04/16/2016 1920   PROTEINUR NEGATIVE 04/16/2016 1920   NITRITE POSITIVE (A) 04/16/2016 1920   LEUKOCYTESUR LARGE (A) 04/16/2016 1920   Sepsis Labs: @LABRCNTIP (procalcitonin:4,lacticidven:4)  ) Recent Results (from the past 240 hour(s))  MRSA PCR Screening     Status: None   Collection Time: 10/30/16 10:53 PM  Result Value Ref Range Status   MRSA by PCR NEGATIVE NEGATIVE Final    Comment:        The GeneXpert MRSA Assay (FDA approved for NASAL specimens only), is one component of a comprehensive MRSA colonization surveillance program. It is not intended to diagnose MRSA infection nor to guide or monitor treatment for MRSA infections.          Radiology Studies: Dg Chest Port 1 View  Result Date: 10/30/2016 CLINICAL DATA:  Acute onset of cough, wheezing and shortness of breath. Initial encounter. EXAM: PORTABLE CHEST 1 VIEW COMPARISON:  Chest radiograph performed 10/17/2016 FINDINGS: The lungs are well-aerated. Mild  vascular congestion is noted. Mild bibasilar opacities raise concern for mild interstitial edema. There is no evidence of pleural effusion or pneumothorax. The cardiomediastinal silhouette is within normal limits. No acute osseous abnormalities are seen. IMPRESSION: Mild vascular congestion noted. Mild bibasilar airspace opacities raise concern for mild interstitial edema. Electronically Signed   By: Roanna Raider M.D.   On: 10/30/2016 20:42        Scheduled Meds: . acetaZOLAMIDE  250 mg Oral BID  . aspirin EC  81 mg Oral Daily  . busPIRone  15 mg Oral BID  . cholecalciferol  1,000 Units Oral Daily  . fluticasone  furoate-vilanterol  1 puff Inhalation Daily  . guaiFENesin  600 mg Oral BID  . heparin  5,000 Units Subcutaneous Q8H  . hydrochlorothiazide  12.5 mg Oral Daily  . ipratropium-albuterol  3 mL Nebulization Q4H  . methylPREDNISolone (SOLU-MEDROL) injection  60 mg Intravenous Q6H  . metoprolol tartrate  25 mg Oral BID  . multivitamin with minerals  1 tablet Oral Daily  . potassium chloride  10 mEq Oral Daily  . sodium chloride flush  3 mL Intravenous Q12H  . sodium chloride flush  3 mL Intravenous Q12H   Continuous Infusions: . sodium chloride    . azithromycin       LOS: 1 day    Time spent: 35 minutes. Greater than 50% of this time was spent in direct contact with the patient coordinating care.     Chaya Jan, MD [facility] Pager [phone]  If 7PM-7AM, please contact night-coverage [email or web address] Password [email or web address] 10/31/2016, 5:10 PM

## 2016-10-31 NOTE — Progress Notes (Signed)
Patient alert and oriented. Vital signs are stable. Oxygen on 3L via nasal cannula. IV patent. PRN pain medication given for stomach pain per patients request prior to transfer.  Report given to [facility], RN and she verbalized understanding of report. Patient transferred via wheelchair to room 304.

## 2016-10-31 NOTE — Progress Notes (Signed)
PT Cancellation Note  Patient Details Name: Sheila Pierce MRN: [id number] DOB: 1945-04-25   Cancelled Treatment:    Reason Eval/Treat Not Completed: Other (comment).  Declined after respiratory therapy had just finished, and did not want to get OOB.  After PT left was transitioned from IC bed to floor and will try again tomorrow.   Ivar Drape 10/31/2016, 2:18 PM   2:19 PM, 10/31/16 Samul Dada, PT, MS Physical Therapist - [place] [phone] (Office)

## 2016-11-01 LAB — CBC
HEMATOCRIT: 33 % — AB (ref 36.0–46.0)
HEMOGLOBIN: 10 g/dL — AB (ref 12.0–15.0)
MCH: 28.3 pg (ref 26.0–34.0)
MCHC: 30.3 g/dL (ref 30.0–36.0)
MCV: 93.5 fL (ref 78.0–100.0)
Platelets: 288 10*3/uL (ref 150–400)
RBC: 3.53 MIL/uL — AB (ref 3.87–5.11)
RDW: 14.5 % (ref 11.5–15.5)
WBC: 21.2 10*3/uL — AB (ref 4.0–10.5)

## 2016-11-01 LAB — BASIC METABOLIC PANEL
ANION GAP: 12 (ref 5–15)
BUN: 30 mg/dL — ABNORMAL HIGH (ref 6–20)
CALCIUM: 9.4 mg/dL (ref 8.9–10.3)
CO2: 28 mmol/L (ref 22–32)
Chloride: 96 mmol/L — ABNORMAL LOW (ref 101–111)
Creatinine, Ser: 1.39 mg/dL — ABNORMAL HIGH (ref 0.44–1.00)
GFR, EST AFRICAN AMERICAN: 43 mL/min — AB (ref >60)
GFR, EST NON AFRICAN AMERICAN: 37 mL/min — AB (ref >60)
GLUCOSE: 142 mg/dL — AB (ref 65–99)
POTASSIUM: 4.1 mmol/L (ref 3.5–5.1)
SODIUM: 136 mmol/L (ref 135–145)

## 2016-11-01 LAB — GLUCOSE, CAPILLARY: GLUCOSE-CAPILLARY: 171 mg/dL — AB (ref 65–99)

## 2016-11-01 NOTE — Progress Notes (Signed)
Initial Nutrition Assessment  DOCUMENTATION CODES:      INTERVENTION:  Ensure Enlive po BID, each supplement provides 350 kcal and 20 grams of protein   Encouraged Heart Healthy diet    NUTRITION DIAGNOSIS:   Increased nutrient needs related to chronic illness (Acute exacerbation and chronic COPD) as evidenced by estimated needs.   GOAL:   Patient will meet greater than or equal to 90% of their needs    MONITOR:   PO intake, Supplement acceptance, Weight trends  REASON FOR ASSESSMENT:   Consult COPD Protocol  ASSESSMENT:  The patient is a 71 yo overweight chronically ill female. She presents from home with SOB- COPD exacerbation. She has hx of NSTEMI, HTN, Asthma, Arthritis, GERD and COPD with home O2. Caregivers and sons are there according to nursing.  She is able to feed herself but is dependent for food shopping and meal preparation. Limited mobility reported- pt has a bedside commode. Skin is intact but dry. She has dentures but they fit poorly.  Intake pattern: Her usual eating pattern is skips breakfast and aids prepare lunch and dinner. She says, "I just whatever they fix". She drinks primarily Sweet tea, sugar-sweetened Kool-Aid and Mtn Dew. She says, "I know I should be drinking water". Snacks: cheese peanut butter crackers and likes apple juice and chocolate Ensure. Denies changes in appetite or food intake. Encourage heart healthy/ anti-inflammatory foods and sugar-free beverages.   Weight: pt unable to report usual wt. Hospital records show stable between 140-143 lbs since March of this year.   Nutrition-Focused physical exam completed. Findings are NO fat depletion, general moderate muscle depletion, and NO edema.    Recent Labs Lab 10/30/16 2032 10/31/16 0324 11/01/16 0425  NA 140 137 136  K 3.9 3.9 4.1  CL 95* 94* 96*  CO2 35* 33* 28  BUN 16 18 30*  CREATININE 1.34* 1.33* 1.39*  CALCIUM 9.7 9.3 9.4  GLUCOSE 134* 212* 142*   Labs: Cr 1.39, BUN  30, Glu 142   Meds: reviewed     Diet Order:  Diet regular Room service appropriate? Yes; Fluid consistency: Thin  Skin:   (dry, intact with small bruies to left arm)  Last BM:  10/6  Height:   Ht Readings from Last 1 Encounters:  10/30/16 5' (1.524 m)    Weight:   Wt Readings from Last 1 Encounters:  10/31/16 143 lb 15.4 oz (65.3 kg)    Ideal Body Weight:  45 kg  BMI:  Body mass index is 28.12 kg/m.  Estimated Nutritional Needs:   Kcal:  1500-1700  Protein:  65-70gr  Fluid:  1.5-1.7 liters daily  EDUCATION NEEDS:  Encouraged increase vegetable intake, whole grains and omega-three rich foods such as salmon and tuna.  Increase water or sugar-free beverages vs her usual sweet tea or sugar-sweetened Kool-Aid.  Barriers: pt unable to shop or prepare her own foods, she has chewing difficulty due to poorly fitting dentures.  Education needs addressed Royann Shivers MS,RD,CSG,LDN Office: [phone] Pager: [phone]

## 2016-11-01 NOTE — Clinical Social Work Note (Signed)
Clinical Social Work Assessment  Patient Details  Name: Sheila Pierce MRN: [id number] Date of Birth: [birth year]  Date of referral:  11/01/16               Reason for consult:  Other (Comment Required) (COPD Gold )                Permission sought to share information with:    Permission granted to share information::     Name::        Agency::     Relationship::     Contact Information:     Housing/Transportation Living arrangements for the past 2 months:  Single Family Home Source of Information:  Patient Patient Interpreter Needed:  None Criminal Activity/Legal Involvement Pertinent to Current Situation/Hospitalization:  No - Comment as needed Significant Relationships:  Adult Children Lives with:  Adult Children Do you feel safe going back to the place where you live?  Yes Need for family participation in patient care:  Yes (Comment)  Care giving concerns:  No care giving concerns identified.    Social Worker assessment / plan:  Patient stated that she has a lot of life stress right now with and up coming move and her son having surgery this week. She stated that she lives with two of her sons. She states that her family blames her for running everyone off who is there to help her by being too demanding. She states that she feels like she is in everybody's way. Patient states that she has no friends because she cannot get out and go.  Patient scored a 14 on the PHQ-9 depression scale and a 8 on the GAD-7 anxiety disorder scale.  Patient was not interested in counseling at this time. She was receptive to having this clinician add behavioral health providers added to her discharge information.  LCSW signing off.     Employment status:  Retired Database administrator PT Recommendations:  Not assessed at this time Information / Referral to community resources:     Patient/Family's Response to care:  Patient is not currently interested in counseling at this  time.   Patient/Family's Understanding of and Emotional Response to Diagnosis, Current Treatment, and Prognosis:  Patient understands her diagnosis, treatment and prognosis.   Emotional Assessment Appearance:  Appears stated age Attitude/Demeanor/Rapport:    Affect (typically observed):  Sad, Tearful/Crying Orientation:  Oriented to Situation, Oriented to  Time, Oriented to Place, Oriented to Self Alcohol / Substance use:  Not Applicable Psych involvement (Current and /or in the community):  No (Comment)  Discharge Needs  Concerns to be addressed:  Mental Health Concerns Readmission within the last 30 days:  Yes Current discharge risk:  None Barriers to Discharge:  No Barriers Identified   Annice Needy, LCSW 11/01/2016, 4:17 PM

## 2016-11-01 NOTE — Evaluation (Signed)
Physical Therapy Evaluation Patient Details Name: Sheila Pierce MRN: [id number] DOB: 06/29/1945 Today's Date: 11/01/2016   History of Present Illness  Sheila Pierce is a 71 y.o. female with medical history significant for COPD with chronic 3 L/m supplemental oxygen requirement, now presented to the emergency department with hypoxia at home, increased shortness of breath, worsening cough, and wheezing. Patient reports that she had recently been treated with antibiotics for similar symptoms, improved transiently, but has begun to worsen again. She denies any lower extremity swelling or tenderness, denies chest pain, and denies fevers. She had increased her supplemental oxygen to 4 L/m at home, but continued to have saturations in the mid 80s. She called EMS for transport to the hospital and was treated with 125 mg of Solu-Medrol and placed on CPAP prior to arrival.     Clinical Impression  Patient mostly limited for taking steps or sitting up in chair due to severe SOB.  Patient will benefit from continued physical therapy in hospital and recommended venue below to increase strength, balance, endurance for safe ADLs and short distanced household gait.    Follow Up Recommendations Home health PT;Supervision/Assistance - 24 hour    Equipment Recommendations  None recommended by PT    Recommendations for Other Services       Precautions / Restrictions Precautions Precautions: Fall Precaution Comments: Supplemental O2 dependent      Mobility  Bed Mobility Overal bed mobility: Modified Independent                Transfers Overall transfer level: Needs assistance Equipment used: None Transfers: Sit to/from [employer] Transfers Sit to Stand: Supervision Stand pivot transfers: Min guard          Ambulation/Gait Ambulation/Gait assistance: Min guard Ambulation Distance (Feet): 3 Feet Assistive device: None     Gait velocity interpretation: Below normal speed for  age/gender General Gait Details: Patient limited to a few steps due to severe SOB on exertion  Stairs            Wheelchair Mobility    Modified Rankin (Stroke Patients Only)       Balance Overall balance assessment: Needs assistance Sitting-balance support: No upper extremity supported;Feet supported Sitting balance-Leahy Scale: Good     Standing balance support: No upper extremity supported;During functional activity Standing balance-Leahy Scale: Fair                               Pertinent Vitals/Pain Pain Assessment: No/denies pain    Home Living Family/patient expects to be discharged to:: Private residence Living Arrangements: Children;Non-relatives/Friends (has home aides 3 hours/day x 7 days/week) Available Help at Discharge: Family Type of Home: House Home Access: Stairs to enter Entrance Stairs-Rails: None Entrance Stairs-Number of Steps: 1 Home Layout: One level Home Equipment: Environmental consultant - 2 wheels;Cane - single point;Wheelchair - manual;Hospital bed      Prior Function Level of Independence: Needs assistance   Gait / Transfers Assistance Needed: Supervised short household distances without assistive device, uses wheelchair for longer distances rolling self  ADL's / Homemaking Assistance Needed: Assisted by one of her sons and home aides        Hand Dominance        Extremity/Trunk Assessment   Upper Extremity Assessment Upper Extremity Assessment: Generalized weakness    Lower Extremity Assessment Lower Extremity Assessment: Generalized weakness    Cervical / Trunk Assessment Cervical / Trunk Assessment: Normal  Communication  Communication: No difficulties  Cognition Arousal/Alertness: Awake/alert Behavior During Therapy: WFL for tasks assessed/performed Overall Cognitive Status: Within Functional Limits for tasks assessed                                        General Comments      Exercises      Assessment/Plan    PT Assessment Patient needs continued PT services  PT Problem List Decreased strength;Decreased activity tolerance;Decreased balance;Decreased mobility       PT Treatment Interventions Gait training;Functional mobility training;Therapeutic activities;Therapeutic exercise;Patient/family education    PT Goals (Current goals can be found in the Care Plan section)  Acute Rehab PT Goals Patient Stated Goal: return home with assistance Time For Goal Achievement: 11/05/16 Potential to Achieve Goals: Good    Frequency Min 3X/week   Barriers to discharge        Co-evaluation               AM-PAC PT "6 Clicks" Daily Activity  Outcome Measure Difficulty turning over in bed (including adjusting bedclothes, sheets and blankets)?: None Difficulty moving from lying on back to sitting on the side of the bed? : None Difficulty sitting down on and standing up from a chair with arms (e.g., wheelchair, bedside commode, etc,.)?: None Help needed moving to and from a bed to chair (including a wheelchair)?: A Little Help needed walking in hospital room?: A Little Help needed climbing 3-5 steps with a railing? : A Little 6 Click Score: 21    End of Session   Activity Tolerance: Patient limited by fatigue (patient limited mostly due to SOB) Patient left: in bed;with call bell/phone within reach;with bed alarm set Nurse Communication: Mobility status PT Visit Diagnosis: Unsteadiness on feet (R26.81);Other abnormalities of gait and mobility (R26.89);Muscle weakness (generalized) (M62.81)    Time: 1610-9604 PT Time Calculation (min) (ACUTE ONLY): 26 min   Charges:   PT Evaluation $PT Eval Low Complexity: 1 Low PT Treatments $Therapeutic Activity: 23-37 mins   PT G Codes:        2:04 PM, 11/05/16 Ocie Bob, MPT Physical Therapist with [employer] 336 [phone] office [phone] mobile phone

## 2016-11-01 NOTE — Care Management Note (Addendum)
Case Management Note  Patient Details  Name: Sheila Pierce MRN: [id number] Date of Birth: 10/09/1945  Subjective/Objective:   Adm with acute exacerbation of COPD. From home, two sons live with her. She is ind with ADL's, walks with RW short distances, uses WC for longer distances.  Patient has continuous oxygen provided by [employer]. She has  aides, M-F 3 hours a day, Sat/Sun -aides "in and out", totaling 20 hours per week. Has had home health in the past.                           Action/Plan: Anticipate DC home, possibly with home health.   ADDENDUM 1415: PT  recommends HH PT.  Patient would like [facility]. Discussed with son -Sheila Pierce - also about 24/7 supervision. He is usually with patient but is scheduled to have shoulder surgery this week. Patient's other son is at home during the day but he is sleeping because he works third shift. Left voicemail for Sheila Pierce of [facility] for home health orders. Anticipate DC tomorrow.   ADDENDUM: 1555: [facility] is out of network with patient's insurance. Discussed with patient's son, [facility]. He reports they have filed a formal compliant against [facility]. [facility]iscussed [facility], Sheila Pierce reports [facility] has sent a letter stating they would not come back out to their house due to poor home conditions. Sheila Pierce states that they will be moving in November. CM will continue to search for a [facility] agency. Discussed situation in regards to poor home conditions with Denyse Amass of [place]. They will take referral for PT, aide, and SW and evaluate at home once discharged.   Expected Discharge Date:     11/03/2016             Expected Discharge Plan:  Home w Home Health Services  In-House Referral:     Discharge planning Services  CM Consult  Post Acute Care Choice:    Choice offered to:     DME Arranged:    DME Agency:     HH Arranged:   Aide, SW, PT  HH Agency:   [facility]  Status of Service:  Completed If discussed at Long Length of Stay Meetings, dates  discussed:    Additional Comments:  Fable Huisman, Chrystine Oiler, RN 11/01/2016, 9:22 AM

## 2016-11-01 NOTE — Progress Notes (Signed)
PROGRESS NOTE    Sheila Pierce  ZOX:[id number] DOB: April 21, 1945 DOA: 10/30/2016 PCP: [name], MD     Brief Narrative:  71 year old woman admitted from home on 10/6 due to shortness of breath and cough. She has a history of COPD on 3 L of chronic oxygen. Was thought to have acute COPD exacerbation and admission was requested.   Assessment & Plan:   Principal Problem:   COPD exacerbation (HCC) Active Problems:   Essential hypertension   CAD (coronary artery disease)   Anemia   Anxiety   Diabetes mellitus, stable (HCC)   Acute on chronic respiratory failure with hypoxia (HCC)   Chronic diastolic CHF (congestive heart failure) (HCC)   CKD (chronic kidney disease), stage II   COPD with acute exacerbation, acute on chronic hypoxemic respiratory failure -Required BiPAP transiently, is now back to her baseline oxygen requirement. -Has more wheezing on exam today, continue steroids at current dose and empiric antibiotics as well as neb treatments.   Chronic diastolic CHF -Compensated  Hypertension -Continue losartan       DVT prophylaxis: Subcutaneous heparin Code Status: DO NOT RESUSCITATE Family Communication: Patient only Disposition Plan: Anticipate discharge home in 24 hours  Consultants:   None  Procedures:   None  Antimicrobials:  Anti-infectives    Start     Dose/Rate Route Frequency Ordered Stop   10/30/16 2200  azithromycin (ZITHROMAX) 500 mg in dextrose 5 % 250 mL IVPB     500 mg 250 mL/hr over 60 Minutes Intravenous Every 24 hours 10/30/16 2158     10/30/16 2145  aztreonam (AZACTAM) 2 g in dextrose 5 % 50 mL IVPB  Status:  Discontinued     2 g 100 mL/hr over 30 Minutes Intravenous  Once 10/30/16 2131 10/30/16 2158       Subjective: Feels more SOB today.  Objective: Vitals:   11/01/16 0756 11/01/16 1126 11/01/16 1333 11/01/16 1534  BP:   (!) 128/59   Pulse: 85  (!) 106   Resp: 18  16   Temp:   98.4 F (36.9 C)   TempSrc:    Oral   SpO2: 99% 97% 97% 97%  Weight:      Height:        Intake/Output Summary (Last 24 hours) at 11/01/16 1629 Last data filed at 11/01/16 0112  Gross per 24 hour  Intake              490 ml  Output                0 ml  Net              490 ml   Filed Weights   10/30/16 1938 10/31/16 0500  Weight: 63.5 kg (140 lb) 65.3 kg (143 lb 15.4 oz)    Examination:  General exam: Alert, awake, oriented x 3 Respiratory system: Poor air movement, moderate bilateral wheezing Cardiovascular system:RRR. No murmurs, rubs, gallops. Gastrointestinal system: Abdomen is nondistended, soft and nontender. No organomegaly or masses felt. Normal bowel sounds heard. Central nervous system: Alert and oriented. No focal neurological deficits. Extremities: No C/C/E, +pedal pulses Skin: No rashes, lesions or ulcers Psychiatry: Judgement and insight appear normal. Mood & affect appropriate.      Data Reviewed: I have personally reviewed following labs and imaging studies  CBC:  Recent Labs Lab 10/30/16 2032 10/31/16 0324 11/01/16 0425  WBC 8.8 7.3 21.2*  NEUTROABS 7.9* 6.8  --   HGB 9.7* 9.5*  10.0*  HCT 31.4* 31.5* 33.0*  MCV 92.1 93.5 93.5  PLT 247 239 288   Basic Metabolic Panel:  Recent Labs Lab 10/28/16 0915 10/30/16 2032 10/31/16 0324 11/01/16 0425  NA 144 140 137 136  K 4.7 3.9 3.9 4.1  CL 97 95* 94* 96*  CO2 32* 35* 33* 28  GLUCOSE 104* 134* 212* 142*  BUN 30*  CREATININE 1.22* 1.34* 1.33* 1.39*  CALCIUM 10.5* 9.7 9.3 9.4   GFR: Estimated Creatinine Clearance: 30.8 mL/min (A) (by C-G formula based on SCr of 1.39 mg/dL (H)). Liver Function Tests:  Recent Labs Lab 10/28/16 0915 10/30/16 2032  AST 20 22  ALT 18 21  ALKPHOS 72 76  BILITOT 0.2 0.4  PROT 6.1 6.8  ALBUMIN 4.0 3.5   No results for input(s): LIPASE, AMYLASE in the last 168 hours. No results for input(s): AMMONIA in the last 168 hours. Coagulation Profile: No results for input(s): INR,  PROTIME in the last 168 hours. Cardiac Enzymes:  Recent Labs Lab 10/30/16 2032 10/31/16 0324  TROPONINI 0.04* 0.06*   BNP (last 3 results) No results for input(s): PROBNP in the last 8760 hours. HbA1C: No results for input(s): HGBA1C in the last 72 hours. CBG:  Recent Labs Lab 10/31/16 0759 11/01/16 0805  GLUCAP 183* 171*   Lipid Profile: No results for input(s): CHOL, HDL, LDLCALC, TRIG, CHOLHDL, LDLDIRECT in the last 72 hours. Thyroid Function Tests: No results for input(s): TSH, T4TOTAL, FREET4, T3FREE, THYROIDAB in the last 72 hours. Anemia Panel: No results for input(s): VITAMINB12, FOLATE, FERRITIN, TIBC, IRON, RETICCTPCT in the last 72 hours. Urine analysis:    Component Value Date/Time   COLORURINE YELLOW 04/16/2016 1920   APPEARANCEUR HAZY (A) 04/16/2016 1920   LABSPEC 1.006 04/16/2016 1920   PHURINE 6.0 04/16/2016 1920   GLUCOSEU NEGATIVE 04/16/2016 1920   HGBUR SMALL (A) 04/16/2016 1920   BILIRUBINUR NEGATIVE 04/16/2016 1920   KETONESUR NEGATIVE 04/16/2016 1920   PROTEINUR NEGATIVE 04/16/2016 1920   NITRITE POSITIVE (A) 04/16/2016 1920   LEUKOCYTESUR LARGE (A) 04/16/2016 1920   Sepsis Labs: (procalcitonin:4,lacticidven:4)  ) Recent Results (from the past 240 hour(s))  MRSA PCR Screening     Status: None   Collection Time: 10/30/16 10:53 PM  Result Value Ref Range Status   MRSA by PCR NEGATIVE NEGATIVE Final    Comment:        The GeneXpert MRSA Assay (FDA approved for NASAL specimens only), is one component of a comprehensive MRSA colonization surveillance program. It is not intended to diagnose MRSA infection nor to guide or monitor treatment for MRSA infections.          Radiology Studies: Dg Chest Port 1 View  Result Date: 10/30/2016 CLINICAL DATA:  Acute onset of cough, wheezing and shortness of breath. Initial encounter. EXAM: PORTABLE CHEST 1 VIEW COMPARISON:  Chest radiograph performed 10/17/2016 FINDINGS: The lungs  are well-aerated. Mild vascular congestion is noted. Mild bibasilar opacities raise concern for mild interstitial edema. There is no evidence of pleural effusion or pneumothorax. The cardiomediastinal silhouette is within normal limits. No acute osseous abnormalities are seen. IMPRESSION: Mild vascular congestion noted. Mild bibasilar airspace opacities raise concern for mild interstitial edema. Electronically Signed   By: Roanna Raider M.D.   On: 10/30/2016 20:42        Scheduled Meds: . acetaZOLAMIDE  250 mg Oral BID  . aspirin EC  81 mg Oral Daily  . busPIRone  15 mg Oral BID  .  cholecalciferol  1,000 Units Oral Daily  . fluticasone furoate-vilanterol  1 puff Inhalation Daily  . guaiFENesin  600 mg Oral BID  . heparin  5,000 Units Subcutaneous Q8H  . hydrochlorothiazide  12.5 mg Oral Daily  . ipratropium-albuterol  3 mL Nebulization Q4H  . methylPREDNISolone (SOLU-MEDROL) injection  60 mg Intravenous Q6H  . metoprolol tartrate  25 mg Oral BID  . multivitamin with minerals  1 tablet Oral Daily  . potassium chloride  10 mEq Oral Daily  . sodium chloride flush  3 mL Intravenous Q12H  . sodium chloride flush  3 mL Intravenous Q12H   Continuous Infusions: . sodium chloride    . azithromycin Stopped (10/31/16 2135)     LOS: 2 days    Time spent: 35 minutes. Greater than 50% of this time was spent in direct contact with the patient coordinating care.     Chaya Jan, MD [facility] Pager [phone]  If 7PM-7AM, please contact night-coverage [email or web address] Password [email or web address] 11/01/2016, 4:29 PM

## 2016-11-02 LAB — GLUCOSE, CAPILLARY: Glucose-Capillary: 156 mg/dL — ABNORMAL HIGH (ref 65–99)

## 2016-11-02 MED ORDER — AZITHROMYCIN 500 MG PO TABS: 500.0000 mg | ORAL_TABLET | Freq: Every day | ORAL | 0 refills | Status: AC

## 2016-11-02 MED ORDER — PREDNISONE 10 MG PO TABS: 10.0000 mg | ORAL_TABLET | Freq: Every day | ORAL | 0 refills | Status: DC

## 2016-11-02 NOTE — Discharge Summary (Signed)
Physician Discharge Summary  Sheila Pierce YQM:[id number] DOB: [birth year] DOA: 10/30/2016  PCP: [name], MD  Admit date: 10/30/2016 Discharge date: 11/02/2016  Time spent: 45 minutes  Recommendations for Outpatient Follow-up:  -Will be discharged home today. -To complete a course of prednisone and azithromycin.  -Advised to follow up with PCP in 2 weeks.  Discharge Diagnoses:  Principal Problem:   COPD exacerbation (HCC) Active Problems:   Essential hypertension   CAD (coronary artery disease)   Anemia   Anxiety   Diabetes mellitus, stable (HCC)   Acute on chronic respiratory failure with hypoxia (HCC)   Chronic diastolic CHF (congestive heart failure) (HCC)   CKD (chronic kidney disease), stage II   Discharge Condition: Stable and improved  Filed Weights   10/30/16 1938 10/31/16 0500 11/02/16 0500  Weight: 63.5 kg (140 lb) 65.3 kg (143 lb 15.4 oz) 65.1 kg (143 lb 8 oz)    History of present illness:  As per Dr. Antionette Char on 10/6: Sheila Pierce is a 71 y.o. female with medical history significant for COPD with chronic 3 L/m supplemental oxygen requirement, now presented to the emergency department with hypoxia at home, increased shortness of breath, worsening cough, and wheezing. Patient reports that she had recently been treated with antibiotics for similar symptoms, improved transiently, but has begun to worsen again. She denies any lower extremity swelling or tenderness, denies chest pain, and denies fevers. She had increased her supplemental oxygen to 4 L/m at home, but continued to have saturations in the mid 80s. She called EMS for transport to the hospital and was treated with 125 mg of Solu-Medrol and placed on CPAP prior to arrival.   ED Course: Upon arrival to the ED, patient is found to be afebrile, saturating adequately with BiPAP and 4 L/m supplemental oxygen, and with vitals otherwise stable. EKG features a sinus tachycardia with rate 108. Chest x-ray is  notable for mild vascular congestion and mild basilar opacity. Chemistry panel features a serum creatinine 1.34, slightly up from her prior baseline. Troponin is slightly elevated to 0.04. CBC features a stable chronic normocytic anemia with hemoglobin of 9.7. Patient was kept on BiPAP in the ED, and treated with Atrovent and antibiotics. She remained hemodynamically stable, but in acute respiratory distress. She will be admitted to the stepdown unit for ongoing evaluation and management of acute on chronic hypoxic respiratory failure suspected secondary to exacerbation in COPD.  Hospital Course:   COPD with acute exacerbation, acute on chronic hypoxemic respiratory failure -Required BiPAP transiently, is now back to her baseline oxygen requirement of 3 L. -Clinically resolved, no further wheezing, feels back to her baseline. -Will discharge home on 5 remaining days of azithromycin and a prednisone taper.  Chronic diastolic CHF -Compensated  Hypertension -Continue losartan  Procedures:  None   Consultations:  None  Discharge Instructions  Discharge Instructions    Diet - low sodium heart healthy    Complete by:  As directed    Increase activity slowly    Complete by:  As directed      Allergies as of 11/02/2016      Reactions   Penicillins Swelling, Other (See Comments)   Reaction:  Unspecified swelling reaction Has patient had a PCN reaction causing immediate rash, facial/tongue/throat swelling, SOB or lightheadedness with hypotension: Yes Has patient had a PCN reaction causing severe rash involving mucus membranes or skin necrosis: No Has patient had a PCN reaction that required hospitalization No Has patient  had a PCN reaction occurring within the last 10 years: Yes If all of the above answers are "NO", then may proceed with Cephalosporin use.   Sulfa Antibiotics Hives, Other (See Comments)   Reaction:  Hallucinations   Codeine Nausea Only   Hydrocodone Nausea Only        Medication List    STOP taking these medications   doxycycline 100 MG tablet Commonly known as:  VIBRA-TABS     TAKE these medications   acetaZOLAMIDE 250 MG tablet Commonly known as:  DIAMOX Take 250 mg by mouth 2 (two) times daily.   aspirin EC 81 MG tablet Take 81 mg by mouth daily.   azithromycin 500 MG tablet Commonly known as:  ZITHROMAX Take 1 tablet (500 mg total) by mouth daily. Take 1 tablet daily for 3 days.   BREO ELLIPTA 100-25 MCG/INH Aepb Generic drug:  fluticasone furoate-vilanterol Inhale 1 puff into the lungs daily.   busPIRone 15 MG tablet Commonly known as:  BUSPAR TAKE 1 TABLET BY MOUTH TWICE DAILY   cholecalciferol 1000 units tablet Commonly known as:  VITAMIN D Take 1,000 Units by mouth daily.   ipratropium-albuterol 0.5-2.5 (3) MG/3ML Soln Commonly known as:  DUONEB Take 3 mLs by nebulization every 4 (four) hours.   lisinopril-hydrochlorothiazide 10-12.5 MG tablet Commonly known as:  PRINZIDE,ZESTORETIC TAKE 1 TABLET BY MOUTH EVERY DAY   metoprolol tartrate 25 MG tablet Commonly known as:  LOPRESSOR TAKE 1 TABLET BY MOUTH TWICE DAILY   multivitamin with minerals Tabs tablet Take 1 tablet by mouth daily.   OXYGEN Inhale 3 L into the lungs continuous.   potassium chloride 10 MEQ tablet Commonly known as:  K-DUR Take 1 tablet (10 mEq total) by mouth daily.   predniSONE 10 MG tablet Commonly known as:  DELTASONE Take 1 tablet (10 mg total) by mouth daily with breakfast. Take 6 tablets today and then decrease by 1 tablet daily until none are left. What changed:  medication strength  how much to take  when to take this  additional instructions   tiotropium 18 MCG inhalation capsule Commonly known as:  SPIRIVA Place 18 mcg into inhaler and inhale daily.   VENTOLIN HFA 108 (90 Base) MCG/ACT inhaler Generic drug:  albuterol INHALE 2 PUFFS EVERY FOUR HOURS AS NEEDED FOR WHEEZING OR SHORTNESS OF BREATH      Allergies   Allergen Reactions  . Penicillins Swelling and Other (See Comments)    Reaction:  Unspecified swelling reaction Has patient had a PCN reaction causing immediate rash, facial/tongue/throat swelling, SOB or lightheadedness with hypotension: Yes Has patient had a PCN reaction causing severe rash involving mucus membranes or skin necrosis: No Has patient had a PCN reaction that required hospitalization No Has patient had a PCN reaction occurring within the last 10 years: Yes If all of the above answers are "NO", then may proceed with Cephalosporin use.  . Sulfa Antibiotics Hives and Other (See Comments)    Reaction:  Hallucinations   . Codeine Nausea Only  . Hydrocodone Nausea Only   Follow-up Information    [facility], [facility] Follow up in 1 week(s).   Why:  Please consider calling [facility] to go to their walk in clinic to schedule counseling.  Contact information: [address] [place] [address] [phone]        [facility] Follow up.   Why:  PLEASE CONTACT THEIR [place] OFFICE AT [phone]. THE [place] OFFICE IS LOCATED AT [address], [place],  [place] [address], TO SCHEDULE COUNSELING.  Contact information: [address] [place] [address] [phone]        [facility] Follow up in 1 week(s).   Specialty:  Behavioral Health Why:  Please call to schedule an appointment for counseling.  Contact information: [address] [place] [address] [phone]       [facility] Follow up.   Specialty:  Home Health Services Why:  home health  Contact information: [address] [place] [address] [phone]        [name], MD. Schedule an appointment as soon as possible for a visit in 2 week(s).   Specialty:  Family Medicine Contact information: [address] [place] [address] [phone]             The results of significant diagnostics from this hospitalization (including imaging, microbiology, ancillary and laboratory) are listed below for reference.    Significant Diagnostic Studies: Dg Chest 2 View  Result Date: 10/17/2016 CLINICAL DATA:  Shortness of breath worsening over the past 3 days with dry cough. EXAM: CHEST  2 VIEW COMPARISON:  10/08/2016, 09/14/2016 and 05/15/2016 FINDINGS: Lungs are adequately inflated without focal consolidation or effusion. Mild chronic interstitial changes over the mid to lower lungs. Flattened diaphragms on the lateral film. Cardiac silhouette is within normal. There is calcified plaque over the thoracic aorta. Remainder of the exam is unchanged. IMPRESSION: No acute cardiopulmonary disease. Aortic Atherosclerosis (ICD10-I70.0) and Emphysema (ICD10-J43.9). Electronically Signed   By: Elberta Fortis M.D.   On: 10/17/2016 17:33   Dg Chest Port 1 View  Result Date: 10/30/2016 CLINICAL DATA:  Acute onset of cough, wheezing and shortness of breath. Initial encounter. EXAM: PORTABLE CHEST 1 VIEW COMPARISON:  Chest radiograph performed 10/17/2016 FINDINGS: The lungs are well-aerated. Mild vascular congestion is noted. Mild bibasilar opacities raise concern for mild interstitial edema. There is no evidence of pleural effusion or pneumothorax. The cardiomediastinal silhouette is within normal limits. No acute osseous abnormalities are seen. IMPRESSION: Mild vascular congestion noted. Mild bibasilar airspace opacities raise concern for mild interstitial edema. Electronically Signed   By: Roanna Raider M.D.   On: 10/30/2016 20:42    Microbiology: Recent Results (from the past 240 hour(s))  MRSA PCR Screening     Status: None   Collection Time: 10/30/16 10:53 PM  Result Value Ref Range Status   MRSA by PCR NEGATIVE NEGATIVE Final    Comment:        The GeneXpert MRSA Assay (FDA approved for NASAL specimens only), is one component of a comprehensive MRSA  colonization surveillance program. It is not intended to diagnose MRSA infection nor to guide or monitor treatment for MRSA infections.      Labs: Basic Metabolic Panel:  Recent Labs Lab 10/28/16 0915 10/30/16 2032 10/31/16 0324 11/01/16 0425  NA 144 140 137 136  K 4.7 3.9 3.9 4.1  CL 97 95* 94* 96*  CO2 32* 35* 33* 28  GLUCOSE 104* 134* 212* 142*  BUN 15 16 18  30*  CREATININE 1.22* 1.34* 1.33* 1.39*  CALCIUM 10.5* 9.7 9.3 9.4   Liver Function Tests:  Recent Labs Lab 10/28/16 0915 10/30/16 2032  AST 20 22  ALT 18 21  ALKPHOS 72 76  BILITOT 0.2 0.4  PROT 6.1 6.8  ALBUMIN 4.0 3.5   No results for input(s): LIPASE, AMYLASE in the last 168 hours. No results  for input(s): AMMONIA in the last 168 hours. CBC:  Recent Labs Lab 10/30/16 2032 10/31/16 0324 11/01/16 0425  WBC 8.8 7.3 21.2*  NEUTROABS 7.9* 6.8  --   HGB 9.7* 9.5* 10.0*  HCT 31.4* 31.5* 33.0*  MCV 92.1 93.5 93.5  PLT 247 239 288   Cardiac Enzymes:  Recent Labs Lab 10/30/16 2032 10/31/16 0324  TROPONINI 0.04* 0.06*   BNP: BNP (last 3 results)  Recent Labs  12/19/15 1225 05/15/16 2247  BNP 25.0 19.0    ProBNP (last 3 results) No results for input(s): PROBNP in the last 8760 hours.  CBG:  Recent Labs Lab 10/31/16 0759 11/01/16 0805 11/02/16 0743  GLUCAP 183* 171* 156*       Signed:  Chaya Jan  [facility] Pager: [phone] 11/02/2016, 5:38 PM

## 2016-11-02 NOTE — Evaluation (Signed)
Occupational Therapy Evaluation Patient Details Name: Sheila Pierce MRN: [id number] DOB: 03-03-1945 Today's Date: 11/02/2016    History of Present Illness Sheila Pierce is a 71 y.o. female with medical history significant for COPD with chronic 3 L/m supplemental oxygen requirement, now presented to the emergency department with hypoxia at home, increased shortness of breath, worsening cough, and wheezing. Patient reports that she had recently been treated with antibiotics for similar symptoms, improved transiently, but has begun to worsen again. She denies any lower extremity swelling or tenderness, denies chest pain, and denies fevers. She had increased her supplemental oxygen to 4 L/m at home, but continued to have saturations in the mid 80s. She called EMS for transport to the hospital and was treated with 125 mg of Solu-Medrol and placed on CPAP prior to arrival.    Clinical Impression   Pt received semi-reclined in bed, marginally agreeable to OT services this am. Pt limited by SOB during evaluation, refused to attempt any ADL completion while standing. Pt reports aide assists with dressing/bathing tasks in the morning, sons available later in day if assistance is needed. Pt able to complete tasks while seated at EOB with increased time and rest breaks. Continuous cuing throughout session for PLB, pt with max difficulty completing preferred open-mouth breathing. At this time pt is at baseline with B/ADL completion and has assistance/supervision 24/7; no further acute OT services required.     Follow Up Recommendations  No OT follow up;Supervision/Assistance - 24 hour    Equipment Recommendations  None recommended by OT       Precautions / Restrictions Precautions Precautions: Fall Precaution Comments: Supplemental O2 dependent Restrictions Weight Bearing Restrictions: No      Mobility Bed Mobility Overal bed mobility: Modified Independent                Transfers Overall  transfer level: Needs assistance Equipment used: None Transfers: Sit to/from [employer] Transfers Sit to Stand: Supervision Stand pivot transfers: Supervision                ADL either performed or assessed with clinical judgement   ADL Overall ADL's : Needs assistance/impaired Eating/Feeding: Set up;Bed level   Grooming: Set up;Sitting                   Toilet Transfer: Supervision/safety;Stand-pivot;BSC   Toileting- Clothing Manipulation and Hygiene: Supervision/safety;Sitting/lateral lean               Vision Baseline Vision/History: No visual deficits Patient Visual Report: No change from baseline Vision Assessment?: No apparent visual deficits            Pertinent Vitals/Pain Pain Assessment: No/denies pain     Hand Dominance Right   Extremity/Trunk Assessment Upper Extremity Assessment Upper Extremity Assessment: Generalized weakness (BUE ROM <50%)   Lower Extremity Assessment Lower Extremity Assessment: Defer to PT evaluation   Cervical / Trunk Assessment Cervical / Trunk Assessment: Normal   Communication Communication Communication: No difficulties   Cognition Arousal/Alertness: Awake/alert Behavior During Therapy: WFL for tasks assessed/performed Overall Cognitive Status: Within Functional Limits for tasks assessed                                                Home Living Family/patient expects to be discharged to:: Private residence Living Arrangements: Children;Non-relatives/Friends (2 sons) Available Help at Discharge: Family  Type of Home: House Home Access: Stairs to enter [employer] of Steps: 1 Entrance Stairs-Rails: None Home Layout: One level         Firefighter: Standard Bathroom Accessibility: Yes   Home Equipment: Walker - 2 wheels;Cane - single point;Wheelchair - manual;Hospital bed          Prior Functioning/Environment Level of Independence: Needs assistance   Gait / Transfers Assistance Needed: Supervised short household distances without assistive device, uses wheelchair for longer distances rolling self ADL's / Homemaking Assistance Needed: Pt has aide 3 hours/day, 7 days/week who assists with B/ADL completion; sons available at other times            OT Problem List: Decreased activity tolerance;Impaired balance (sitting and/or standing);Cardiopulmonary status limiting activity       End of Session    Activity Tolerance: Patient tolerated treatment well Patient left: in bed;with call bell/phone within reach;with bed alarm set  OT Visit Diagnosis: Muscle weakness (generalized) (M62.81)                Time: 9604-5409 OT Time Calculation (min): 12 min Charges:  OT General Charges $OT Visit: 1 Visit OT Evaluation $OT Eval Low Complexity: 1 Low    Ezra Sites, OTR/L  [phone] 11/02/2016, 8:27 AM

## 2016-11-02 NOTE — Care Management Important Message (Signed)
Important Message  Patient Details  Name: Sheila Pierce MRN: [id number] Date of Birth: [birth year]   Medicare Important Message Given:  Yes    Juanito Gonyer, Chrystine Oiler, RN 11/02/2016, 9:30 AM

## 2016-11-04 ENCOUNTER — Ambulatory Visit: Payer: Medicare HMO | Admitting: "Endocrinology

## 2016-11-04 ENCOUNTER — Other Ambulatory Visit: Payer: Self-pay | Admitting: "Endocrinology

## 2016-11-04 DIAGNOSIS — E1165 Type 2 diabetes mellitus with hyperglycemia: Secondary | ICD-10-CM

## 2016-11-05 DIAGNOSIS — I10 Essential (primary) hypertension: Secondary | ICD-10-CM | POA: Diagnosis not present

## 2016-11-05 DIAGNOSIS — H548 Legal blindness, as defined in USA: Secondary | ICD-10-CM | POA: Diagnosis not present

## 2016-11-05 DIAGNOSIS — J441 Chronic obstructive pulmonary disease with (acute) exacerbation: Secondary | ICD-10-CM | POA: Diagnosis not present

## 2016-11-05 DIAGNOSIS — I509 Heart failure, unspecified: Secondary | ICD-10-CM | POA: Diagnosis not present

## 2016-11-05 DIAGNOSIS — M6281 Muscle weakness (generalized): Secondary | ICD-10-CM | POA: Diagnosis not present

## 2016-11-06 DIAGNOSIS — M6281 Muscle weakness (generalized): Secondary | ICD-10-CM | POA: Diagnosis not present

## 2016-11-06 DIAGNOSIS — I10 Essential (primary) hypertension: Secondary | ICD-10-CM | POA: Diagnosis not present

## 2016-11-06 DIAGNOSIS — J441 Chronic obstructive pulmonary disease with (acute) exacerbation: Secondary | ICD-10-CM | POA: Diagnosis not present

## 2016-11-06 DIAGNOSIS — H548 Legal blindness, as defined in USA: Secondary | ICD-10-CM | POA: Diagnosis not present

## 2016-11-06 DIAGNOSIS — I509 Heart failure, unspecified: Secondary | ICD-10-CM | POA: Diagnosis not present

## 2016-11-09 DIAGNOSIS — I6789 Other cerebrovascular disease: Secondary | ICD-10-CM | POA: Diagnosis not present

## 2016-11-09 DIAGNOSIS — R300 Dysuria: Secondary | ICD-10-CM | POA: Diagnosis not present

## 2016-11-09 DIAGNOSIS — R69 Illness, unspecified: Secondary | ICD-10-CM | POA: Diagnosis not present

## 2016-11-09 DIAGNOSIS — K5900 Constipation, unspecified: Secondary | ICD-10-CM | POA: Diagnosis not present

## 2016-11-09 DIAGNOSIS — R279 Unspecified lack of coordination: Secondary | ICD-10-CM | POA: Diagnosis not present

## 2016-11-09 DIAGNOSIS — R911 Solitary pulmonary nodule: Secondary | ICD-10-CM | POA: Diagnosis not present

## 2016-11-09 DIAGNOSIS — I10 Essential (primary) hypertension: Secondary | ICD-10-CM | POA: Diagnosis not present

## 2016-11-09 DIAGNOSIS — R410 Disorientation, unspecified: Secondary | ICD-10-CM | POA: Diagnosis not present

## 2016-11-09 DIAGNOSIS — Z7982 Long term (current) use of aspirin: Secondary | ICD-10-CM | POA: Diagnosis not present

## 2016-11-09 DIAGNOSIS — H548 Legal blindness, as defined in USA: Secondary | ICD-10-CM | POA: Diagnosis not present

## 2016-11-09 DIAGNOSIS — J441 Chronic obstructive pulmonary disease with (acute) exacerbation: Secondary | ICD-10-CM | POA: Diagnosis not present

## 2016-11-09 DIAGNOSIS — J449 Chronic obstructive pulmonary disease, unspecified: Secondary | ICD-10-CM | POA: Diagnosis not present

## 2016-11-09 DIAGNOSIS — R399 Unspecified symptoms and signs involving the genitourinary system: Secondary | ICD-10-CM | POA: Diagnosis not present

## 2016-11-09 DIAGNOSIS — Z79899 Other long term (current) drug therapy: Secondary | ICD-10-CM | POA: Diagnosis not present

## 2016-11-09 DIAGNOSIS — I509 Heart failure, unspecified: Secondary | ICD-10-CM | POA: Diagnosis not present

## 2016-11-09 DIAGNOSIS — R918 Other nonspecific abnormal finding of lung field: Secondary | ICD-10-CM | POA: Diagnosis not present

## 2016-11-09 DIAGNOSIS — Z743 Need for continuous supervision: Secondary | ICD-10-CM | POA: Diagnosis not present

## 2016-11-09 DIAGNOSIS — M6281 Muscle weakness (generalized): Secondary | ICD-10-CM | POA: Diagnosis not present

## 2016-11-09 DIAGNOSIS — Z87891 Personal history of nicotine dependence: Secondary | ICD-10-CM | POA: Diagnosis not present

## 2016-11-09 DIAGNOSIS — R0602 Shortness of breath: Secondary | ICD-10-CM | POA: Diagnosis not present

## 2016-11-09 DIAGNOSIS — J9811 Atelectasis: Secondary | ICD-10-CM | POA: Diagnosis not present

## 2016-11-10 DIAGNOSIS — J449 Chronic obstructive pulmonary disease, unspecified: Secondary | ICD-10-CM | POA: Diagnosis not present

## 2016-11-10 DIAGNOSIS — J9610 Chronic respiratory failure, unspecified whether with hypoxia or hypercapnia: Secondary | ICD-10-CM | POA: Diagnosis not present

## 2016-11-11 DIAGNOSIS — E1165 Type 2 diabetes mellitus with hyperglycemia: Secondary | ICD-10-CM | POA: Diagnosis not present

## 2016-11-12 LAB — HEMOGLOBIN A1C
ESTIMATED AVERAGE GLUCOSE: 103 mg/dL
Hgb A1c MFr Bld: 5.2 % (ref 4.8–5.6)

## 2016-11-13 DIAGNOSIS — R69 Illness, unspecified: Secondary | ICD-10-CM | POA: Diagnosis not present

## 2016-11-13 DIAGNOSIS — E873 Alkalosis: Secondary | ICD-10-CM | POA: Diagnosis not present

## 2016-11-13 DIAGNOSIS — D649 Anemia, unspecified: Secondary | ICD-10-CM | POA: Diagnosis not present

## 2016-11-13 DIAGNOSIS — R410 Disorientation, unspecified: Secondary | ICD-10-CM | POA: Diagnosis not present

## 2016-11-13 DIAGNOSIS — R0989 Other specified symptoms and signs involving the circulatory and respiratory systems: Secondary | ICD-10-CM | POA: Diagnosis not present

## 2016-11-13 DIAGNOSIS — J9811 Atelectasis: Secondary | ICD-10-CM | POA: Diagnosis not present

## 2016-11-13 DIAGNOSIS — R05 Cough: Secondary | ICD-10-CM | POA: Diagnosis not present

## 2016-11-13 DIAGNOSIS — J9621 Acute and chronic respiratory failure with hypoxia: Secondary | ICD-10-CM | POA: Diagnosis not present

## 2016-11-13 DIAGNOSIS — Z7982 Long term (current) use of aspirin: Secondary | ICD-10-CM | POA: Diagnosis not present

## 2016-11-13 DIAGNOSIS — J441 Chronic obstructive pulmonary disease with (acute) exacerbation: Secondary | ICD-10-CM | POA: Diagnosis not present

## 2016-11-13 DIAGNOSIS — J9611 Chronic respiratory failure with hypoxia: Secondary | ICD-10-CM | POA: Diagnosis not present

## 2016-11-13 DIAGNOSIS — R4182 Altered mental status, unspecified: Secondary | ICD-10-CM | POA: Diagnosis not present

## 2016-11-13 DIAGNOSIS — I2510 Atherosclerotic heart disease of native coronary artery without angina pectoris: Secondary | ICD-10-CM | POA: Diagnosis not present

## 2016-11-13 DIAGNOSIS — E874 Mixed disorder of acid-base balance: Secondary | ICD-10-CM | POA: Diagnosis not present

## 2016-11-13 DIAGNOSIS — G9341 Metabolic encephalopathy: Secondary | ICD-10-CM | POA: Diagnosis not present

## 2016-11-13 DIAGNOSIS — R0602 Shortness of breath: Secondary | ICD-10-CM | POA: Diagnosis not present

## 2016-11-13 DIAGNOSIS — I10 Essential (primary) hypertension: Secondary | ICD-10-CM | POA: Diagnosis not present

## 2016-11-13 DIAGNOSIS — Z9981 Dependence on supplemental oxygen: Secondary | ICD-10-CM | POA: Diagnosis not present

## 2016-11-13 DIAGNOSIS — J449 Chronic obstructive pulmonary disease, unspecified: Secondary | ICD-10-CM | POA: Diagnosis not present

## 2016-11-13 DIAGNOSIS — E119 Type 2 diabetes mellitus without complications: Secondary | ICD-10-CM | POA: Diagnosis not present

## 2016-11-13 DIAGNOSIS — R918 Other nonspecific abnormal finding of lung field: Secondary | ICD-10-CM | POA: Diagnosis not present

## 2016-11-17 ENCOUNTER — Encounter: Payer: Self-pay | Admitting: "Endocrinology

## 2016-11-17 ENCOUNTER — Ambulatory Visit (INDEPENDENT_AMBULATORY_CARE_PROVIDER_SITE_OTHER): Payer: Medicare HMO | Admitting: "Endocrinology

## 2016-11-17 VITALS — BP 136/82 | HR 87 | Ht 61.0 in

## 2016-11-17 DIAGNOSIS — I10 Essential (primary) hypertension: Secondary | ICD-10-CM

## 2016-11-17 DIAGNOSIS — N183 Chronic kidney disease, stage 3 unspecified: Secondary | ICD-10-CM

## 2016-11-17 DIAGNOSIS — E1122 Type 2 diabetes mellitus with diabetic chronic kidney disease: Secondary | ICD-10-CM | POA: Diagnosis not present
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
## 2016-11-18 ENCOUNTER — Telehealth: Payer: Self-pay | Admitting: Family Medicine

## 2016-11-18 NOTE — Telephone Encounter (Signed)
Refer, again, to adult protective services.  Tell them the information below.
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
## 2016-11-19 ENCOUNTER — Other Ambulatory Visit: Payer: Self-pay | Admitting: Family Medicine

## 2016-11-19 NOTE — Telephone Encounter (Signed)
Should she continue this?
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
## 2016-11-22 NOTE — Telephone Encounter (Signed)
agree

## 2016-11-24 ENCOUNTER — Telehealth: Payer: Self-pay | Admitting: *Deleted

## 2016-11-24 ENCOUNTER — Other Ambulatory Visit (HOSPITAL_COMMUNITY)
Admission: RE | Admit: 2016-11-24 | Discharge: 2016-11-24 | Disposition: A | Discharge status: Home / Self Care | Payer: Medicare HMO | Source: Other Acute Inpatient Hospital | Attending: Family Medicine | Admitting: Family Medicine

## 2016-11-24 ENCOUNTER — Ambulatory Visit (INDEPENDENT_AMBULATORY_CARE_PROVIDER_SITE_OTHER): Payer: Medicare HMO | Admitting: Family Medicine

## 2016-11-24 ENCOUNTER — Encounter: Payer: Self-pay | Admitting: Family Medicine

## 2016-11-24 VITALS — BP 128/80 | HR 82 | Temp 97.4°F | Resp 16

## 2016-11-24 DIAGNOSIS — J9621 Acute and chronic respiratory failure with hypoxia: Secondary | ICD-10-CM | POA: Diagnosis not present

## 2016-11-24 DIAGNOSIS — R300 Dysuria: Secondary | ICD-10-CM

## 2016-11-24 DIAGNOSIS — J441 Chronic obstructive pulmonary disease with (acute) exacerbation: Secondary | ICD-10-CM | POA: Diagnosis not present

## 2016-11-24 DIAGNOSIS — E440 Moderate protein-calorie malnutrition: Secondary | ICD-10-CM | POA: Diagnosis not present

## 2016-11-24 DIAGNOSIS — Z09 Encounter for follow-up examination after completed treatment for conditions other than malignant neoplasm: Secondary | ICD-10-CM | POA: Diagnosis not present

## 2016-11-24 DIAGNOSIS — I10 Essential (primary) hypertension: Secondary | ICD-10-CM | POA: Diagnosis not present

## 2016-11-24 DIAGNOSIS — L89152 Pressure ulcer of sacral region, stage 2: Secondary | ICD-10-CM

## 2016-11-24 LAB — POCT URINALYSIS DIPSTICK
Bilirubin, UA: NEGATIVE
GLUCOSE UA: NEGATIVE
Ketones, UA: NEGATIVE
NITRITE UA: NEGATIVE
Protein, UA: NEGATIVE
Spec Grav, UA: 1.015 (ref 1.010–1.025)
UROBILINOGEN UA: 0.2 U/dL
pH, UA: 5.5 (ref 5.0–8.0)

## 2016-11-24 MED ORDER — CIPROFLOXACIN HCL 250 MG PO TABS: 250.0000 mg | ORAL_TABLET | Freq: Two times a day (BID) | ORAL | 0 refills | Status: DC

## 2016-11-24 NOTE — Progress Notes (Signed)
Chief Complaint  Patient presents with  . Follow-up  . Urinary Retention   Patient is here for hospital follow-up. Since her last visit here with me on 08/27/2016 she has been in the hospital 3 times.  She was hospitalized at [facility] on 09/14/2016 and again on 10/30/2016 for exacerbations of COPD.  She was admitted to [facility] on 11/13/2016 for altered mental status.  This was also due to exacerbation of her COPD with hypoxia causing acute confusion.  She remained in the hospital until 11/16/2016.  No change in her medications although she was treated with antibiotics and a burst of steroids. During her most recent hospitalization she was found to have a sacral decubitus ulcer.  She had a nutrition visit to help with her malnutrition status and an increase in her protein was recommended.  She has a dressing in place that is been there since her hospitalization.  The wound causes her very little pain.  I explained to the Margean, her son Ebbie Ridge, and her social worker Jonny Ruiz who accompany her today that a bedsore is a sign of inadequate medical care and nutrition.  The son Scotty admits that her air mattress developed a hole sometime back, and he never got it replaced.  A new air mattress is ordered today.  Home health nursing is ordered today.  Frequent turns are reviewed with him.  We discussed her nutrition, she eats very little.  This is common of end-stage COPD people since breathing is such a struggle.  I gave him samples of Ensure plus protein.  They also encourage carnation instant breakfast.  They plan to get a blender or food processor to try to grind her meat for her.  She only eats about a quarter of a sandwich at a time , however her weight is stable over the last 6 months.  Her breathing is back to baseline. The notes for her hospital stays are reviewed.  She is also found to be anemic.  This is not unexpected with her chronic renal failure and chronic disease.  It is  stable. Her only acute complaint today is burning with urination and difficulty urinating.  She feels her urine is decreased.  She feels she is drinking enough water.  She is comes in with a [facility] in her hand.  I commented that this is not a good replacement fluid.   Patient Active Problem List   Diagnosis Date Noted  . Chronic diastolic CHF (congestive heart failure) (HCC) 10/30/2016  . CKD (chronic kidney disease), stage II 10/30/2016  . Pressure injury of skin 09/15/2016  . COPD (chronic obstructive pulmonary disease) (HCC) 12/19/2015  . Acute on chronic respiratory failure with hypoxia (HCC) 12/19/2015  . Hypercholesteremia 12/16/2015  . Osteoporosis 12/01/2015  . Colonoscopy refused 12/01/2015  . Macular degeneration, age related, nonexudative 11/26/2015  . Cataract incipient, senile, bilateral 11/26/2015  . Onychomycosis of toenail 11/11/2015  . Aortic atherosclerosis (HCC) 11/11/2015  . Abnormal thyroid function test 11/06/2015  . Diabetes mellitus, stable (HCC) 11/05/2015  . COPD exacerbation (HCC) 11/04/2015  . AKI (acute kidney injury) (HCC) 11/03/2015  . Anemia 11/03/2015  . Anxiety 11/03/2015  . Acute exacerbation of chronic obstructive pulmonary disease (COPD) (HCC) 09/19/2012  . CAD (coronary artery disease) 11/24/2011  . Essential hypertension   . MI, acute, non ST segment elevation (HCC) 10/05/2011    Outpatient Encounter Prescriptions as of 11/24/2016  Medication Sig  . acetaZOLAMIDE (DIAMOX) 250 MG tablet TAKE ONE TABLET  BY MOUTH TWICE DAILY  . aspirin EC 81 MG tablet Take 81 mg by mouth daily.  Marland Kitchen. BREO ELLIPTA 100-25 MCG/INH AEPB Inhale 1 puff into the lungs daily.  . busPIRone (BUSPAR) 15 MG tablet TAKE 1 TABLET BY MOUTH TWICE DAILY  . cholecalciferol (VITAMIN D) 1000 units tablet Take 1,000 Units by mouth daily.  Marland Kitchen. ipratropium-albuterol (DUONEB) 0.5-2.5 (3) MG/3ML SOLN Take 3 mLs by nebulization every 4 (four) hours.  Marland Kitchen. lisinopril-hydrochlorothiazide  (PRINZIDE,ZESTORETIC) 10-12.5 MG tablet TAKE 1 TABLET BY MOUTH EVERY DAY  . metoprolol tartrate (LOPRESSOR) 25 MG tablet TAKE 1 TABLET BY MOUTH TWICE DAILY  . Multiple Vitamin (MULTIVITAMIN WITH MINERALS) TABS tablet Take 1 tablet by mouth daily.  . OXYGEN Inhale 3 L into the lungs continuous.   . potassium chloride (K-DUR) 10 MEQ tablet Take 1 tablet (10 mEq total) by mouth daily.  . predniSONE (DELTASONE) 10 MG tablet Take 1 tablet (10 mg total) by mouth daily with breakfast. Take 6 tablets today and then decrease by 1 tablet daily until none are left.  . tiotropium (SPIRIVA) 18 MCG inhalation capsule Place 18 mcg into inhaler and inhale daily.  . VENTOLIN HFA 108 (90 Base) MCG/ACT inhaler INHALE 2 PUFFS EVERY FOUR HOURS AS NEEDED FOR WHEEZING OR SHORTNESS OF BREATH  . ciprofloxacin (CIPRO) 250 MG tablet Take 1 tablet (250 mg total) by mouth 2 (two) times daily.   No facility-administered encounter medications on file as of 11/24/2016.     Allergies  Allergen Reactions  . Penicillins Swelling and Other (See Comments)    Reaction:  Unspecified swelling reaction Has patient had a PCN reaction causing immediate rash, facial/tongue/throat swelling, SOB or lightheadedness with hypotension: Yes Has patient had a PCN reaction causing severe rash involving mucus membranes or skin necrosis: No Has patient had a PCN reaction that required hospitalization No Has patient had a PCN reaction occurring within the last 10 years: Yes If all of the above answers are "NO", then may proceed with Cephalosporin use.  . Sulfa Antibiotics Hives and Other (See Comments)    Reaction:  Hallucinations   . Codeine Nausea Only  . Hydrocodone Nausea Only    Review of Systems  Constitutional: Positive for fatigue. Negative for activity change, appetite change and unexpected weight change.  HENT: Negative for congestion, dental problem, postnasal drip and rhinorrhea.   Eyes: Negative for redness and visual  disturbance.  Respiratory: Positive for cough, shortness of breath and wheezing.   Cardiovascular: Negative for chest pain, palpitations and leg swelling.  Gastrointestinal: Negative for abdominal pain, constipation and diarrhea.  Genitourinary: Positive for dysuria and frequency. Negative for difficulty urinating.  Musculoskeletal: Negative for arthralgias and back pain.  Neurological: Positive for weakness. Negative for dizziness and headaches.  Psychiatric/Behavioral: Positive for dysphoric mood. Negative for sleep disturbance. The patient is not nervous/anxious.        At times, lonely.  Confusion has resolved    BP 128/80 (BP Location: Left Arm, Patient Position: Sitting, Cuff Size: Normal)   Pulse 82   Temp (!) 97.4 F (36.3 C) (Other (Comment))   Resp 16   SpO2 95%   Physical Exam  Constitutional: She is oriented to person, place, and time. She appears well-developed and well-nourished.  Short of breath, a little more than her usual.  In wheelchair. Oxygen dependent. Appears fatigued.  HENT:  Head: Normocephalic and atraumatic.  Right Ear: External ear normal.  Left Ear: External ear normal.  Mouth/Throat: Oropharynx is clear and moist.  Edentulous. Mucous membranes moist  Eyes: Pupils are equal, round, and reactive to light. Conjunctivae are normal.  Neck: Normal range of motion. Neck supple. No thyromegaly present.  Cardiovascular: Normal rate, regular rhythm and normal heart sounds.   Pulmonary/Chest: She has no wheezes. She has no rales.  Decreased breath sounds. Tachypnea. No wheeze. Faint rales in bases    Abdominal: Soft. Bowel sounds are normal.  Musculoskeletal: Normal range of motion. She exhibits no edema.  Resists movement.  Complains of back pain.  Extremities have normal strength sensation range of motion.  No edema.  No clubbing  Lymphadenopathy:    She has no cervical adenopathy.  Neurological: She is alert and oriented to person, place, and time.  Skin:  Skin is warm and dry.  Thin skin, easy bruisability.  Bruises on both hands and forearms.  Sacrum has a 3 cm area of erythema, blanchable.  Centrally there are 2 superficial eschars.  No drainage or odor.  Skin cleanser is used with a new DuoDERM placed.  Psychiatric: Her behavior is normal.  Quiet, depressed.    ASSESSMENT/PLAN:  1. Hospital discharge follow-up Multiple recent hospitalizations.  2. Essential hypertension Controlled  3. Acute on chronic respiratory failure with hypoxia (HCC) Patient has end-stage COPD oxygen dependent with multiple exacerbations.  Poor social status and family support. - For home use only DME Specialty mattress  4. COPD exacerbation (HCC) - Ambulatory referral to Home Health - For home use only DME Specialty mattress  5. Dysuria Results for orders placed or performed in visit on 11/24/16  POCT Urinalysis Dipstick  Result Value Ref Range   Color, UA yellow    Clarity, UA cloudy    Glucose, UA neg    Bilirubin, UA neg    Ketones, UA neg    Spec Grav, UA 1.015 1.010 - 1.025   Blood, UA large    pH, UA 5.5 5.0 - 8.0   Protein, UA neg    Urobilinogen, UA 0.2 0.2 or 1.0 E.U./dL   Nitrite, UA neg    Leukocytes, UA Small (1+) (A) Negative   Likely UTI.  Treat with Cipro - POCT Urinalysis Dipstick - Urine Culture - Urine Culture  6. Pressure injury of sacral region, stage 2 Cleaned and redressed. - Ambulatory referral to Home Health - For home use only DME Specialty mattress  7. Malnutrition of moderate degree (HCC) Discussed nutrition.  Gave samples of Ensure   Patient Instructions  Need home health nurse got dressing changes to ulcer on back side I will order padding for bed Need to increase protein in the diet Drink plenty of water Take 3 days of antibiotic for bladder infection  Need to turn every two hours and avoid laying flat on back  Avoid any pressure in back side/buttocks (sore area)  See me in one month  I will check  on the oxygen recommendations of Dr Juanetta Gosling    Pressure Injury A pressure injury, sometimes called a bedsore, is an injury to the skin and underlying tissue caused by pressure. Pressure on blood vessels causes decreased blood flow to the skin, which can eventually cause the skin tissue to die and break down into a wound. Pressure injuries usually occur:  Over bony parts of the body such as the tailbone, shoulders, elbows, hips, and heels.  Under medical devices such as respiratory equipment, stockings, tubes, and splints.  Pressure injuries start as reddened areas on the skin and can lead to pain, muscle damage, and infection.  Pressure injuries can vary in severity. What are the causes? Pressure injuries are caused by a lack of blood supply to an area of skin. They can occur from intense pressure over a short period of time or from less intense pressure over a long period of time. What increases the risk? This condition is more likely to develop in people who:  Are in the hospital or an extended care facility.  Are bedridden or in a wheelchair.  Have an injury or disease that keeps them from: ? Moving normally. ? Feeling pain or pressure.  Have a condition that: ? Makes them sleepy or less alert. ? Causes poor blood flow.  Need to wear a medical device.  Have poor control of their bladder or bowel functions (incontinence).  Have poor nutrition (malnutrition).  If you are at risk for pressure ulcers, your health care provider may recommend certain types of bedding to help prevent them. These may include foam or gel mattresses covered with one of the following:  A sheepskin blanket.  A pad that is filled with gel, air, water, or foam. This will be ordered  What are the signs or symptoms? The main symptom is a blister or change in skin color that opens into a wound. Other symptoms include:  Red or dark areas of skin that do not turn white or pale when pressed with a  finger.  Pain, warmth, or change of skin texture.  How is this diagnosed? This condition is diagnosed with a medical history and physical exam. You may also have tests, including:  Blood tests to check for infection or signs of poor nutrition.  Imaging studies to check for damage to the deep tissues under your skin.  Blood flow studies.  Your pressure injury will be staged to determine its severity. Staging is an assessment of:  The depth of the pressure injury.  Which tissues are exposed because of the pressure injury.  The causes of the pressure injury.  How is this treated? The main focus of treatment is to help your injury heal. This may be done by:  Relieving or redistributing pressure on your skin. This includes: ? Frequently changing your position. - every 2 hours ? Eliminating or minimizing positions that caused the wound or that can make the wound worse. ? Using specific bed mattresses and chair cushions. ? Refitting, resizing, or replacing any medical devices, or padding the skin under them. ? Using creams or powders to prevent rubbing (friction) on the skin.  Keeping your skin clean and dry. This may include using a skin cleanser or skin protectant as told by your health care provider. This may be a lotion, ointment, or spray.  Placing a bandage (dressing) over your injury. Leave this dressing to be changed by nurse  Preventing or treating infection. This may include antibiotic, antimicrobial, or antiseptic medicines.  Treatment may also include medicine for pain. Sometimes surgery is needed to close the wound with a flap of healthy skin or a piece of skin from another area of your body (graft). You may need surgery if other treatments are not working or if your injury is very deep. Follow these instructions at home: Wound care  Follow instructions from your health care provider about: ? How to take care of your wound. ? When and how you should change your  dressing. ? When you should remove your dressing. If your dressing is dry and stuck when you try to remove it, moisten or wet the dressing  with saline or water so that it can be removed without harming your skin or wound tissue.  Check your wound every day for signs of infection. Have a caregiver do this for you if you are not able. Watch for: ? More redness, swelling, or pain. ? More fluid, blood, or pus. ? A bad smell. Skin Care  Keep your skin clean and dry. Gently pat your skin dry.  Do not rub or massage your skin.  Use a skin protectant only as told by your health care provider.  Check your skin every day for any changes in color or any new blisters or sores (ulcers). Have a caregiver do this for you if you are not able. Medicines  Take over-the-counter and prescription medicines only as told by your health care provider.  If you were prescribed an antibiotic medicine, take it or apply it as told by your health care provider. Do not stop taking or using the antibiotic even if your condition improves. Reducing and Redistributing Pressure  Do not lie or sit in one position for a long time. Move or change position every two hours or as told by your health care provider.  Use pillows or cushions to reduce pressure. Ask your health care provider to recommend cushions or pads for you.  Use medical devices that do not rub your skin. Tell your health care provider if one of your medical devices is causing a pressure injury to develop. General instructions   Eat a healthy diet that includes lots of protein. Ask your health care provider for diet advice.  Drink enough fluid to keep your urine clear or pale yellow. Contact a health care provider if:   You have chills or fever.  Your pain medicine is not helping.  You have any changes in skin color.  You have new blisters or sores.  You develop warmth, redness, or swelling near a pressure injury.  You have a bad odor or pus  coming from your pressure injury.  You lose control of your bowels or bladder.  You develop new symptoms.  This information is not intended to replace advice given to you by your health care provider. Make sure you discuss any questions you have with your health care provider. Document Released: 01/11/2005 Document Revised: 06/16/2015 Document Reviewed: 05/22/2014 Elsevier Interactive Patient Education  2018 [employer]vier Inc.    [name], MD

## 2016-11-24 NOTE — Telephone Encounter (Signed)
It looks as though this was a one time medication over a year ago?

## 2016-11-24 NOTE — Telephone Encounter (Signed)
Patient's son called stating the patient needs her tramadol 50mg  refilled to [facility]. Please advise

## 2016-11-24 NOTE — Telephone Encounter (Signed)
May refill 

## 2016-11-24 NOTE — Patient Instructions (Addendum)
Need home health nurse got dressing changes to ulcer on back side I will order padding for bed Need to increase protein in the diet Drink plenty of water Take 3 days of antibiotic for bladder infection  Need to turn every two hours and avoid laying flat on back  Avoid any pressure in back side/buttocks (sore area)  See me in one month  I will check on the oxygen recommendations of Dr Juanetta Gosling    Pressure Injury A pressure injury, sometimes called a bedsore, is an injury to the skin and underlying tissue caused by pressure. Pressure on blood vessels causes decreased blood flow to the skin, which can eventually cause the skin tissue to die and break down into a wound. Pressure injuries usually occur:  Over bony parts of the body such as the tailbone, shoulders, elbows, hips, and heels.  Under medical devices such as respiratory equipment, stockings, tubes, and splints.  Pressure injuries start as reddened areas on the skin and can lead to pain, muscle damage, and infection. Pressure injuries can vary in severity. What are the causes? Pressure injuries are caused by a lack of blood supply to an area of skin. They can occur from intense pressure over a short period of time or from less intense pressure over a long period of time. What increases the risk? This condition is more likely to develop in people who:  Are in the hospital or an extended care facility.  Are bedridden or in a wheelchair.  Have an injury or disease that keeps them from: ? Moving normally. ? Feeling pain or pressure.  Have a condition that: ? Makes them sleepy or less alert. ? Causes poor blood flow.  Need to wear a medical device.  Have poor control of their bladder or bowel functions (incontinence).  Have poor nutrition (malnutrition).  If you are at risk for pressure ulcers, your health care provider may recommend certain types of bedding to help prevent them. These may include foam or gel mattresses  covered with one of the following:  A sheepskin blanket.  A pad that is filled with gel, air, water, or foam. This will be ordered  What are the signs or symptoms? The main symptom is a blister or change in skin color that opens into a wound. Other symptoms include:  Red or dark areas of skin that do not turn white or pale when pressed with a finger.  Pain, warmth, or change of skin texture.  How is this diagnosed? This condition is diagnosed with a medical history and physical exam. You may also have tests, including:  Blood tests to check for infection or signs of poor nutrition.  Imaging studies to check for damage to the deep tissues under your skin.  Blood flow studies.  Your pressure injury will be staged to determine its severity. Staging is an assessment of:  The depth of the pressure injury.  Which tissues are exposed because of the pressure injury.  The causes of the pressure injury.  How is this treated? The main focus of treatment is to help your injury heal. This may be done by:  Relieving or redistributing pressure on your skin. This includes: ? Frequently changing your position. - every 2 hours ? Eliminating or minimizing positions that caused the wound or that can make the wound worse. ? Using specific bed mattresses and chair cushions. ? Refitting, resizing, or replacing any medical devices, or padding the skin under them. ? Using creams or powders to  prevent rubbing (friction) on the skin.  Keeping your skin clean and dry. This may include using a skin cleanser or skin protectant as told by your health care provider. This may be a lotion, ointment, or spray.  Placing a bandage (dressing) over your injury. Leave this dressing to be changed by nurse  Preventing or treating infection. This may include antibiotic, antimicrobial, or antiseptic medicines.  Treatment may also include medicine for pain. Sometimes surgery is needed to close the wound with a  flap of healthy skin or a piece of skin from another area of your body (graft). You may need surgery if other treatments are not working or if your injury is very deep. Follow these instructions at home: Wound care  Follow instructions from your health care provider about: ? How to take care of your wound. ? When and how you should change your dressing. ? When you should remove your dressing. If your dressing is dry and stuck when you try to remove it, moisten or wet the dressing with saline or water so that it can be removed without harming your skin or wound tissue.  Check your wound every day for signs of infection. Have a caregiver do this for you if you are not able. Watch for: ? More redness, swelling, or pain. ? More fluid, blood, or pus. ? A bad smell. Skin Care  Keep your skin clean and dry. Gently pat your skin dry.  Do not rub or massage your skin.  Use a skin protectant only as told by your health care provider.  Check your skin every day for any changes in color or any new blisters or sores (ulcers). Have a caregiver do this for you if you are not able. Medicines  Take over-the-counter and prescription medicines only as told by your health care provider.  If you were prescribed an antibiotic medicine, take it or apply it as told by your health care provider. Do not stop taking or using the antibiotic even if your condition improves. Reducing and Redistributing Pressure  Do not lie or sit in one position for a long time. Move or change position every two hours or as told by your health care provider.  Use pillows or cushions to reduce pressure. Ask your health care provider to recommend cushions or pads for you.  Use medical devices that do not rub your skin. Tell your health care provider if one of your medical devices is causing a pressure injury to develop. General instructions   Eat a healthy diet that includes lots of protein. Ask your health care provider for  diet advice.  Drink enough fluid to keep your urine clear or pale yellow. Contact a health care provider if:   You have chills or fever.  Your pain medicine is not helping.  You have any changes in skin color.  You have new blisters or sores.  You develop warmth, redness, or swelling near a pressure injury.  You have a bad odor or pus coming from your pressure injury.  You lose control of your bowels or bladder.  You develop new symptoms.  This information is not intended to replace advice given to you by your health care provider. Make sure you discuss any questions you have with your health care provider. Document Released: 01/11/2005 Document Revised: 06/16/2015 Document Reviewed: 05/22/2014 Elsevier Interactive Patient Education  [employer]2018 [employer].

## 2016-11-25 MED ORDER — TRAMADOL HCL 50 MG PO TABS: 50.0000 mg | ORAL_TABLET | Freq: Four times a day (QID) | ORAL | 0 refills | Status: AC | PRN

## 2016-11-26 DIAGNOSIS — R69 Illness, unspecified: Secondary | ICD-10-CM | POA: Diagnosis not present

## 2016-11-26 LAB — URINE CULTURE

## 2016-11-27 DIAGNOSIS — J449 Chronic obstructive pulmonary disease, unspecified: Secondary | ICD-10-CM | POA: Diagnosis not present

## 2016-11-27 DIAGNOSIS — R32 Unspecified urinary incontinence: Secondary | ICD-10-CM | POA: Diagnosis not present

## 2016-11-27 DIAGNOSIS — J441 Chronic obstructive pulmonary disease with (acute) exacerbation: Secondary | ICD-10-CM | POA: Diagnosis not present

## 2016-11-27 DIAGNOSIS — J438 Other emphysema: Secondary | ICD-10-CM | POA: Diagnosis not present

## 2016-11-27 DIAGNOSIS — M818 Other osteoporosis without current pathological fracture: Secondary | ICD-10-CM | POA: Diagnosis not present

## 2016-11-29 DIAGNOSIS — J438 Other emphysema: Secondary | ICD-10-CM | POA: Diagnosis not present

## 2016-11-29 DIAGNOSIS — M818 Other osteoporosis without current pathological fracture: Secondary | ICD-10-CM | POA: Diagnosis not present

## 2016-11-29 DIAGNOSIS — R32 Unspecified urinary incontinence: Secondary | ICD-10-CM | POA: Diagnosis not present

## 2016-11-29 DIAGNOSIS — J449 Chronic obstructive pulmonary disease, unspecified: Secondary | ICD-10-CM | POA: Diagnosis not present

## 2016-12-01 DIAGNOSIS — J440 Chronic obstructive pulmonary disease with acute lower respiratory infection: Secondary | ICD-10-CM | POA: Diagnosis not present

## 2016-12-01 DIAGNOSIS — R32 Unspecified urinary incontinence: Secondary | ICD-10-CM | POA: Diagnosis not present

## 2016-12-01 DIAGNOSIS — L89152 Pressure ulcer of sacral region, stage 2: Secondary | ICD-10-CM | POA: Diagnosis not present

## 2016-12-01 DIAGNOSIS — J438 Other emphysema: Secondary | ICD-10-CM | POA: Diagnosis not present

## 2016-12-01 DIAGNOSIS — J441 Chronic obstructive pulmonary disease with (acute) exacerbation: Secondary | ICD-10-CM | POA: Diagnosis not present

## 2016-12-01 DIAGNOSIS — M818 Other osteoporosis without current pathological fracture: Secondary | ICD-10-CM | POA: Diagnosis not present

## 2016-12-01 DIAGNOSIS — J449 Chronic obstructive pulmonary disease, unspecified: Secondary | ICD-10-CM | POA: Diagnosis not present

## 2016-12-01 DIAGNOSIS — E440 Moderate protein-calorie malnutrition: Secondary | ICD-10-CM | POA: Diagnosis not present

## 2016-12-06 ENCOUNTER — Ambulatory Visit: Payer: Self-pay | Admitting: Family Medicine

## 2016-12-06 DIAGNOSIS — M810 Age-related osteoporosis without current pathological fracture: Secondary | ICD-10-CM

## 2016-12-06 DIAGNOSIS — D631 Anemia in chronic kidney disease: Secondary | ICD-10-CM | POA: Diagnosis not present

## 2016-12-06 DIAGNOSIS — R69 Illness, unspecified: Secondary | ICD-10-CM | POA: Diagnosis not present

## 2016-12-06 DIAGNOSIS — F329 Major depressive disorder, single episode, unspecified: Secondary | ICD-10-CM

## 2016-12-06 DIAGNOSIS — I130 Hypertensive heart and chronic kidney disease with heart failure and stage 1 through stage 4 chronic kidney disease, or unspecified chronic kidney disease: Secondary | ICD-10-CM | POA: Diagnosis not present

## 2016-12-06 DIAGNOSIS — N182 Chronic kidney disease, stage 2 (mild): Secondary | ICD-10-CM | POA: Diagnosis not present

## 2016-12-06 DIAGNOSIS — J439 Emphysema, unspecified: Secondary | ICD-10-CM | POA: Diagnosis not present

## 2016-12-06 DIAGNOSIS — I2510 Atherosclerotic heart disease of native coronary artery without angina pectoris: Secondary | ICD-10-CM | POA: Diagnosis not present

## 2016-12-06 DIAGNOSIS — M1991 Primary osteoarthritis, unspecified site: Secondary | ICD-10-CM | POA: Diagnosis not present

## 2016-12-06 DIAGNOSIS — G500 Trigeminal neuralgia: Secondary | ICD-10-CM

## 2016-12-06 DIAGNOSIS — E1122 Type 2 diabetes mellitus with diabetic chronic kidney disease: Secondary | ICD-10-CM | POA: Diagnosis not present

## 2016-12-06 DIAGNOSIS — I5032 Chronic diastolic (congestive) heart failure: Secondary | ICD-10-CM | POA: Diagnosis not present

## 2016-12-06 DIAGNOSIS — F419 Anxiety disorder, unspecified: Secondary | ICD-10-CM | POA: Diagnosis not present

## 2016-12-06 DIAGNOSIS — I252 Old myocardial infarction: Secondary | ICD-10-CM | POA: Diagnosis not present

## 2016-12-06 DIAGNOSIS — J9621 Acute and chronic respiratory failure with hypoxia: Secondary | ICD-10-CM | POA: Diagnosis not present

## 2016-12-11 DIAGNOSIS — J449 Chronic obstructive pulmonary disease, unspecified: Secondary | ICD-10-CM | POA: Diagnosis not present

## 2016-12-11 DIAGNOSIS — J9610 Chronic respiratory failure, unspecified whether with hypoxia or hypercapnia: Secondary | ICD-10-CM | POA: Diagnosis not present

## 2016-12-16 ENCOUNTER — Encounter (HOSPITAL_COMMUNITY): Payer: Self-pay

## 2016-12-16 ENCOUNTER — Inpatient Hospital Stay (HOSPITAL_COMMUNITY)
Admission: EM | Admit: 2016-12-16 | Discharge: 2016-12-21 | DRG: 190 | Disposition: A | Discharge status: Home / Self Care | Payer: Medicare HMO | Attending: Internal Medicine | Admitting: Internal Medicine

## 2016-12-16 ENCOUNTER — Other Ambulatory Visit: Payer: Self-pay

## 2016-12-16 ENCOUNTER — Emergency Department (HOSPITAL_COMMUNITY): Payer: Medicare HMO

## 2016-12-16 DIAGNOSIS — N183 Chronic kidney disease, stage 3 unspecified: Secondary | ICD-10-CM

## 2016-12-16 DIAGNOSIS — K219 Gastro-esophageal reflux disease without esophagitis: Secondary | ICD-10-CM | POA: Diagnosis not present

## 2016-12-16 DIAGNOSIS — M79662 Pain in left lower leg: Secondary | ICD-10-CM | POA: Diagnosis not present

## 2016-12-16 DIAGNOSIS — R0989 Other specified symptoms and signs involving the circulatory and respiratory systems: Secondary | ICD-10-CM | POA: Diagnosis not present

## 2016-12-16 DIAGNOSIS — J9602 Acute respiratory failure with hypercapnia: Secondary | ICD-10-CM

## 2016-12-16 DIAGNOSIS — F419 Anxiety disorder, unspecified: Secondary | ICD-10-CM | POA: Diagnosis present

## 2016-12-16 DIAGNOSIS — I130 Hypertensive heart and chronic kidney disease with heart failure and stage 1 through stage 4 chronic kidney disease, or unspecified chronic kidney disease: Secondary | ICD-10-CM | POA: Diagnosis not present

## 2016-12-16 DIAGNOSIS — Z882 Allergy status to sulfonamides status: Secondary | ICD-10-CM | POA: Diagnosis not present

## 2016-12-16 DIAGNOSIS — Z79899 Other long term (current) drug therapy: Secondary | ICD-10-CM | POA: Diagnosis not present

## 2016-12-16 DIAGNOSIS — I5032 Chronic diastolic (congestive) heart failure: Secondary | ICD-10-CM | POA: Diagnosis present

## 2016-12-16 DIAGNOSIS — Z7952 Long term (current) use of systemic steroids: Secondary | ICD-10-CM

## 2016-12-16 DIAGNOSIS — I952 Hypotension due to drugs: Secondary | ICD-10-CM | POA: Diagnosis not present

## 2016-12-16 DIAGNOSIS — J9600 Acute respiratory failure, unspecified whether with hypoxia or hypercapnia: Secondary | ICD-10-CM

## 2016-12-16 DIAGNOSIS — I252 Old myocardial infarction: Secondary | ICD-10-CM

## 2016-12-16 DIAGNOSIS — Z87891 Personal history of nicotine dependence: Secondary | ICD-10-CM

## 2016-12-16 DIAGNOSIS — M810 Age-related osteoporosis without current pathological fracture: Secondary | ICD-10-CM | POA: Diagnosis present

## 2016-12-16 DIAGNOSIS — R0682 Tachypnea, not elsewhere classified: Secondary | ICD-10-CM | POA: Diagnosis not present

## 2016-12-16 DIAGNOSIS — E1122 Type 2 diabetes mellitus with diabetic chronic kidney disease: Secondary | ICD-10-CM

## 2016-12-16 DIAGNOSIS — Z880 Allergy status to penicillin: Secondary | ICD-10-CM | POA: Diagnosis not present

## 2016-12-16 DIAGNOSIS — E872 Acidosis: Secondary | ICD-10-CM | POA: Diagnosis not present

## 2016-12-16 DIAGNOSIS — Z885 Allergy status to narcotic agent status: Secondary | ICD-10-CM | POA: Diagnosis not present

## 2016-12-16 DIAGNOSIS — R0602 Shortness of breath: Secondary | ICD-10-CM | POA: Diagnosis not present

## 2016-12-16 DIAGNOSIS — I5033 Acute on chronic diastolic (congestive) heart failure: Secondary | ICD-10-CM | POA: Diagnosis not present

## 2016-12-16 DIAGNOSIS — D649 Anemia, unspecified: Secondary | ICD-10-CM | POA: Diagnosis present

## 2016-12-16 DIAGNOSIS — T501X5A Adverse effect of loop [high-ceiling] diuretics, initial encounter: Secondary | ICD-10-CM | POA: Diagnosis not present

## 2016-12-16 DIAGNOSIS — E7800 Pure hypercholesterolemia, unspecified: Secondary | ICD-10-CM | POA: Diagnosis not present

## 2016-12-16 DIAGNOSIS — Z7982 Long term (current) use of aspirin: Secondary | ICD-10-CM

## 2016-12-16 DIAGNOSIS — R0902 Hypoxemia: Secondary | ICD-10-CM | POA: Diagnosis not present

## 2016-12-16 DIAGNOSIS — I10 Essential (primary) hypertension: Secondary | ICD-10-CM | POA: Diagnosis not present

## 2016-12-16 DIAGNOSIS — I2510 Atherosclerotic heart disease of native coronary artery without angina pectoris: Secondary | ICD-10-CM | POA: Diagnosis present

## 2016-12-16 DIAGNOSIS — Z66 Do not resuscitate: Secondary | ICD-10-CM | POA: Diagnosis present

## 2016-12-16 DIAGNOSIS — E119 Type 2 diabetes mellitus without complications: Secondary | ICD-10-CM

## 2016-12-16 DIAGNOSIS — J9621 Acute and chronic respiratory failure with hypoxia: Secondary | ICD-10-CM | POA: Diagnosis not present

## 2016-12-16 DIAGNOSIS — E1129 Type 2 diabetes mellitus with other diabetic kidney complication: Secondary | ICD-10-CM | POA: Diagnosis not present

## 2016-12-16 DIAGNOSIS — J441 Chronic obstructive pulmonary disease with (acute) exacerbation: Principal | ICD-10-CM | POA: Diagnosis present

## 2016-12-16 DIAGNOSIS — R0609 Other forms of dyspnea: Secondary | ICD-10-CM | POA: Diagnosis not present

## 2016-12-16 DIAGNOSIS — I361 Nonrheumatic tricuspid (valve) insufficiency: Secondary | ICD-10-CM | POA: Diagnosis not present

## 2016-12-16 DIAGNOSIS — J9601 Acute respiratory failure with hypoxia: Secondary | ICD-10-CM | POA: Diagnosis not present

## 2016-12-16 DIAGNOSIS — T380X5A Adverse effect of glucocorticoids and synthetic analogues, initial encounter: Secondary | ICD-10-CM | POA: Diagnosis not present

## 2016-12-16 DIAGNOSIS — F329 Major depressive disorder, single episode, unspecified: Secondary | ICD-10-CM | POA: Diagnosis present

## 2016-12-16 DIAGNOSIS — J9622 Acute and chronic respiratory failure with hypercapnia: Secondary | ICD-10-CM | POA: Diagnosis not present

## 2016-12-16 DIAGNOSIS — I700 Atherosclerosis of aorta: Secondary | ICD-10-CM | POA: Diagnosis present

## 2016-12-16 DIAGNOSIS — M79606 Pain in leg, unspecified: Secondary | ICD-10-CM | POA: Diagnosis present

## 2016-12-16 DIAGNOSIS — J9620 Acute and chronic respiratory failure, unspecified whether with hypoxia or hypercapnia: Secondary | ICD-10-CM | POA: Diagnosis not present

## 2016-12-16 HISTORY — DX: Acute respiratory failure, unspecified whether with hypoxia or hypercapnia: J96.00

## 2016-12-16 LAB — BASIC METABOLIC PANEL
Anion gap: 5 (ref 5–15)
BUN: 23 mg/dL — AB (ref 6–20)
CHLORIDE: 95 mmol/L — AB (ref 101–111)
CO2: 35 mmol/L — AB (ref 22–32)
CREATININE: 1.46 mg/dL — AB (ref 0.44–1.00)
Calcium: 10.3 mg/dL (ref 8.9–10.3)
GFR calc Af Amer: 40 mL/min — ABNORMAL LOW (ref >60)
GFR calc non Af Amer: 35 mL/min — ABNORMAL LOW (ref >60)
Glucose, Bld: 159 mg/dL — ABNORMAL HIGH (ref 65–99)
Potassium: 3.9 mmol/L (ref 3.5–5.1)
Sodium: 135 mmol/L (ref 135–145)

## 2016-12-16 LAB — BLOOD GAS, ARTERIAL
Acid-Base Excess: 3.3 mmol/L — ABNORMAL HIGH (ref 0.0–2.0)
Bicarbonate: 25.7 mmol/L (ref 20.0–28.0)
DELIVERY SYSTEMS: POSITIVE
DRAWN BY: 317771
Expiratory PAP: 6
FIO2: 0.5
Inspiratory PAP: 14
LHR: 10 {breaths}/min
O2 Saturation: 98.5 %
PCO2 ART: 81.5 mmHg — AB (ref 32.0–48.0)
pH, Arterial: 7.202 — ABNORMAL LOW (ref 7.350–7.450)
pO2, Arterial: 150 mmHg — ABNORMAL HIGH (ref 83.0–108.0)

## 2016-12-16 LAB — CBC
HCT: 38.5 % (ref 36.0–46.0)
Hemoglobin: 11.1 g/dL — ABNORMAL LOW (ref 12.0–15.0)
MCH: 27.8 pg (ref 26.0–34.0)
MCHC: 28.8 g/dL — AB (ref 30.0–36.0)
MCV: 96.3 fL (ref 78.0–100.0)
PLATELETS: 264 10*3/uL (ref 150–400)
RBC: 4 MIL/uL (ref 3.87–5.11)
RDW: 14.1 % (ref 11.5–15.5)
WBC: 8 10*3/uL (ref 4.0–10.5)

## 2016-12-16 LAB — I-STAT TROPONIN, ED: Troponin i, poc: 0 ng/mL (ref 0.00–0.08)

## 2016-12-16 LAB — BRAIN NATRIURETIC PEPTIDE: B Natriuretic Peptide: 18 pg/mL (ref 0.0–100.0)

## 2016-12-16 MED ORDER — FUROSEMIDE 10 MG/ML IJ SOLN
40.0000 mg | Freq: Once | INTRAMUSCULAR | Status: AC
  Administered 2016-12-16: 40 mg via INTRAVENOUS
  Filled 2016-12-16: qty 4

## 2016-12-16 MED ORDER — ALBUTEROL (5 MG/ML) CONTINUOUS INHALATION SOLN
10.0000 mg/h | INHALATION_SOLUTION | Freq: Once | RESPIRATORY_TRACT | Status: AC
  Administered 2016-12-16: 10 mg/h via RESPIRATORY_TRACT
  Filled 2016-12-16: qty 20

## 2016-12-16 MED ORDER — IPRATROPIUM BROMIDE 0.02 % IN SOLN
0.5000 mg | Freq: Once | RESPIRATORY_TRACT | Status: AC
  Administered 2016-12-16: 0.5 mg via RESPIRATORY_TRACT
  Filled 2016-12-16: qty 2.5

## 2016-12-16 NOTE — ED Notes (Signed)
Date and time results received: 12/16/16 @22 :31 Test: ABG Critical Value: ph 7.202 PCO2 81.5 PO2 150 Bicarb 25.7 Name of Provider Notified: Dr Linwood Dibbles  Orders Received? Or Actions Taken?: no additional orders given,

## 2016-12-16 NOTE — ED Provider Notes (Signed)
[facility]GENCY DEPARTMENT Provider Note   CSN: [id number] Arrival date & time: 12/16/16  2145     History   Chief Complaint Chief Complaint  Patient presents with  . Respiratory Distress    HPI Sheila Pierce is a 71 y.o. female.  HPI Patient presents to the emergency room for evaluation of shortness of breath.  Patient has a history of severe COPD requiring frequent hospitalization.  Patient states she just started feeling short of breath today.  It acutely became worse.  She is not sure of all the activity and visitors today exacerbated her symptoms.  She denies any chest pain but does have some pain in her back.  She has been coughing.  This evening the symptoms became very severe and she was struggling to breathe.  She called EMS.  EMS gave the patient 3 nebulizer treatments as well as 125 mg of Solu-Medrol.  I placed her on CPAP. Past Medical History:  Diagnosis Date  . Allergy   . Anemia   . Anxiety   . Arthritis   . Asthma   . Cataract   . Chronic diastolic CHF (congestive heart failure) (HCC) 10/30/2016  . Chronic kidney disease   . COPD (chronic obstructive pulmonary disease) (HCC)    Chronic resp failure on home O2  . Depression   . Diabetes mellitus without complication (HCC)   . Emphysema of lung (HCC)   . GERD (gastroesophageal reflux disease)   . Hypercholesteremia 12/16/2015  . Hyperglycemia    Noted 09/2011 admission in setting of steroid use with normal HgbA1C.  Marland Kitchen. Hypertension   . Hypokalemia   . Hyponatremia    Noted 09/2011 admission.  . NSTEMI (non-ST elevated myocardial infarction) (HCC)    a. 09/2011 in setting of acute on chronic resp failure/COPD exacerbation --> cath demonstrated nonobstructive CAD 10/05/11 with EF 50%;  08/2012 elevated Ti in setting of COPD flare -->Echo: EF 60-65%, Gr 1 DD -->Med Rx.  . Osteoporosis   . Oxygen deficiency   . QT prolongation    Noted on EKG 09/2011 (590 in setting of K of 3, improved to 475 by discharge)  .  Tobacco abuse 09/19/2012  . Trigeminal neuralgia     Patient Active Problem List   Diagnosis Date Noted  . Chronic diastolic CHF (congestive heart failure) (HCC) 10/30/2016  . CKD (chronic kidney disease), stage II 10/30/2016  . Pressure injury of skin 09/15/2016  . COPD (chronic obstructive pulmonary disease) (HCC) 12/19/2015  . Acute on chronic respiratory failure with hypoxia (HCC) 12/19/2015  . Hypercholesteremia 12/16/2015  . Osteoporosis 12/01/2015  . Colonoscopy refused 12/01/2015  . Macular degeneration, age related, nonexudative 11/26/2015  . Cataract incipient, senile, bilateral 11/26/2015  . Onychomycosis of toenail 11/11/2015  . Aortic atherosclerosis (HCC) 11/11/2015  . Abnormal thyroid function test 11/06/2015  . Diabetes mellitus, stable (HCC) 11/05/2015  . COPD exacerbation (HCC) 11/04/2015  . AKI (acute kidney injury) (HCC) 11/03/2015  . Anemia 11/03/2015  . Anxiety 11/03/2015  . Acute exacerbation of chronic obstructive pulmonary disease (COPD) (HCC) 09/19/2012  . CAD (coronary artery disease) 11/24/2011  . Essential hypertension   . MI, acute, non ST segment elevation (HCC) 10/05/2011    Past Surgical History:  Procedure Laterality Date  . ABDOMINAL HYSTERECTOMY     bleeding  . CHOLECYSTECTOMY    . LEFT HEART CATHETERIZATION WITH CORONARY ANGIOGRAM N/A 10/05/2011   Procedure: LEFT HEART CATHETERIZATION WITH CORONARY ANGIOGRAM;  Surgeon: Tonny BollmanMichael Cooper, MD;  [facility];  Service: Cardiovascular;  Laterality: N/A;  . PLANTAR'S WART EXCISION      OB History    No data available       Home Medications    Prior to Admission medications   Medication Sig Start Date End Date Taking? Authorizing Provider  acetaZOLAMIDE (DIAMOX) 250 MG tablet TAKE ONE TABLET BY MOUTH TWICE DAILY 11/19/16  Yes [name], MD  aspirin EC 81 MG tablet Take 81 mg by mouth daily.   Yes [provider]  BREO ELLIPTA 100-25 MCG/INH AEPB Inhale 1 puff  into the lungs daily. 11/11/15  Yes [name], MD  busPIRone (BUSPAR) 15 MG tablet TAKE 1 TABLET BY MOUTH TWICE DAILY 05/31/16  Yes [name], MD  cholecalciferol (VITAMIN D) 1000 units tablet Take 1,000 Units by mouth daily.   Yes [provider]  ipratropium-albuterol (DUONEB) 0.5-2.5 (3) MG/3ML SOLN Take 3 mLs by nebulization every 4 (four) hours. 11/11/15  Yes [name], MD  lisinopril-hydrochlorothiazide (PRINZIDE,ZESTORETIC) 10-12.5 MG tablet TAKE 1 TABLET BY MOUTH EVERY DAY 05/31/16  Yes [name], MD  metoprolol tartrate (LOPRESSOR) 25 MG tablet TAKE 1 TABLET BY MOUTH TWICE DAILY 05/31/16  Yes [name], MD  Multiple Vitamin (MULTIVITAMIN WITH MINERALS) TABS tablet Take 1 tablet by mouth daily.   Yes [provider]  OXYGEN Inhale 3 L into the lungs continuous.    Yes [provider]  potassium chloride (K-DUR) 10 MEQ tablet Take 1 tablet (10 mEq total) by mouth daily. 03/25/16  Yes [name], MD  tiotropium (SPIRIVA) 18 MCG inhalation capsule Place 18 mcg into inhaler and inhale daily.   Yes [provider]  traMADol (ULTRAM) 50 MG tablet Take 1 tablet (50 mg total) by mouth every 6 (six) hours as needed for moderate pain or severe pain. 11/25/16  Yes [name], MD  VENTOLIN HFA 108 [phone] Base) MCG/ACT inhaler INHALE 2 PUFFS EVERY FOUR HOURS AS NEEDED FOR WHEEZING OR SHORTNESS OF BREATH 10/07/16  Yes [name], MD  ciprofloxacin (CIPRO) 250 MG tablet Take 1 tablet (250 mg total) by mouth 2 (two) times daily. Patient not taking: Reported on 12/16/2016 11/24/16   [name], MD  predniSONE (DELTASONE) 10 MG tablet Take 1 tablet (10 mg total) by mouth daily with breakfast. Take 6 tablets today and then decrease by 1 tablet daily until none are left. Patient not taking: Reported on 12/16/2016 11/02/16   Philip Aspen, Limmie Patricia, MD    Family History Family History  Problem Relation Age of  Onset  . Cancer Father        Lung  . Cancer Mother        Liver  . Hypertension Son   . Hyperlipidemia Son   . Post-traumatic stress disorder Son   . Depression Son   . Kidney disease Brother        liver and kidney failure  . Hypertension Son   . Hyperlipidemia Son   . Cancer Other        colon  . Diabetes Son   . Lung disease Daughter        passed away at 90 months old    Social History Social History   Tobacco Use  . Smoking status: Former Smoker    Packs/day: 1.00    Years: 40.00    Pack years: 40.00    Types: Cigarettes    Start date: 01/26/1964  Last attempt to quit: 10/2014    Years since quitting: 2.1  . Smokeless tobacco: Never Used  Substance Use Topics  . Alcohol use: No    Comment: occasional  . Drug use: No     Allergies   Penicillins; Sulfa antibiotics; Codeine; Hydrocodone; and Levaquin [levofloxacin]   Review of Systems Review of Systems  Constitutional: Negative for fever.  Respiratory: Positive for shortness of breath.   Cardiovascular: Negative for chest pain.  Gastrointestinal: Negative for abdominal pain.  Genitourinary: Negative for dysuria.  Neurological: Negative for headaches.  All other systems reviewed and are negative.    Physical Exam Updated Vital Signs BP 108/77   Pulse (!) 110   Temp (!) 97.5 F (36.4 C) (Oral)   Resp 19   Ht 1.524 m (5')   Wt 66.2 kg (146 lb)   SpO2 100%   BMI 28.51 kg/m   Physical Exam  Constitutional: She appears distressed.  HENT:  Head: Normocephalic and atraumatic.  Right Ear: External ear normal.  Left Ear: External ear normal.  Eyes: Conjunctivae are normal. Right eye exhibits no discharge. Left eye exhibits no discharge. No scleral icterus.  Neck: Neck supple. No tracheal deviation present.  Cardiovascular: Normal rate, regular rhythm and intact distal pulses.  Pulmonary/Chest: Accessory muscle usage present. No stridor. Tachypnea noted. No respiratory distress. She has decreased  breath sounds. She has wheezes. She has rales.  Abdominal: Soft. Bowel sounds are normal. She exhibits no distension. There is no tenderness. There is no rebound and no guarding.  Musculoskeletal: She exhibits no edema or tenderness.  Neurological: She is alert. She has normal strength. No cranial nerve deficit (no facial droop, extraocular movements intact, no slurred speech) or sensory deficit. She exhibits normal muscle tone. She displays no seizure activity. Coordination normal.  Skin: Skin is warm and dry. No rash noted. She is not diaphoretic.  Psychiatric: She has a normal mood and affect.  Nursing note and vitals reviewed.    ED Treatments / Results  Labs (all labs ordered are listed, but only abnormal results are displayed) Labs Reviewed  BASIC METABOLIC PANEL - Abnormal; Notable for the following components:      Result Value   Chloride 95 (*)    CO2 35 (*)    Glucose, Bld 159 (*)    BUN 23 (*)    Creatinine, Ser 1.46 (*)    GFR calc non Af Amer 35 (*)    GFR calc Af Amer 40 (*)    All other components within normal limits  CBC - Abnormal; Notable for the following components:   Hemoglobin 11.1 (*)    MCHC 28.8 (*)    All other components within normal limits  BLOOD GAS, ARTERIAL - Abnormal; Notable for the following components:   pH, Arterial 7.202 (*)    pCO2 arterial 81.5 (*)    pO2, Arterial 150 (*)    Acid-Base Excess 3.3 (*)    All other components within normal limits  BRAIN NATRIURETIC PEPTIDE  I-STAT TROPONIN, ED    EKG  EKG Interpretation None       Radiology Dg Chest Port 1 View  Result Date: 12/16/2016 CLINICAL DATA:  Shortness of breath tonight. EXAM: PORTABLE CHEST 1 VIEW COMPARISON:  Radiograph 11/09/2016 FINDINGS: Unchanged heart size and mediastinal contours. Aortic arch atherosclerosis. Increased vascular congestion. Mild peribronchial cuffing may be pulmonary edema. Small bilateral pleural effusions. Streaky bibasilar atelectasis. No  pneumothorax. IMPRESSION: Mild CHF with vascular congestion, small pleural effusions  and mild peribronchial thickening suspicious for pulmonary edema. Electronically Signed   By: Rubye Oaks M.D.   On: 12/16/2016 22:22    Procedures .Critical Care Performed by: Linwood Dibbles, MD Authorized by: Linwood Dibbles, MD   Critical care provider statement:    Critical care time (minutes):  35   Critical care was necessary to treat or prevent imminent or life-threatening deterioration of the following conditions:  Respiratory failure   Critical care was time spent personally by me on the following activities:  Discussions with consultants, evaluation of patient's response to treatment, examination of patient, ordering and performing treatments and interventions, ordering and review of laboratory studies, ordering and review of radiographic studies, pulse oximetry, re-evaluation of patient's condition, obtaining history from patient or surrogate and review of old charts   (including critical care time)  Medications Ordered in ED Medications  furosemide (LASIX) injection 40 mg (not administered)  albuterol (PROVENTIL,VENTOLIN) solution continuous neb (10 mg/hr Nebulization Given 12/16/16 2203)  ipratropium (ATROVENT) nebulizer solution 0.5 mg (0.5 mg Nebulization Given 12/16/16 2203)     Initial Impression / Assessment and Plan / ED Course  I have reviewed the triage vital signs and the nursing notes.  Pertinent labs & imaging results that were available during my care of the patient were reviewed by me and considered in my medical decision making (see chart for details).  Clinical Course as of Dec 17 2307  Thu Dec 16, 2016  2236 I updated the patient and the family with the findings.  I explained to her that her carbon dioxide level was elevated.  I discussed intubation.  Patient states she does not want to be intubated.  In the past BiPAP has worked.  [JK]  2237 She does feel more comfortable  after her treatments.  She is able to answer my questions fully with the BiPAP mask on  [JK]    Clinical Course User Index [JK] Linwood Dibbles, MD    Patient presented to the emergency room for acute respiratory distress.  Patient has a history of severe COPD with frequent hospitalizations.  In the emergency room the patient appears to be having recurrent COPD exacerbation.  Her ABG shows respiratory acidosis.  I discussed with the patient about intubation.  She would only agree to that if she was unresponsive and was near coding.  Patient states in the past BiPAP has worked.  She does seem to be responding.    Final Clinical Impressions(s) / ED Diagnoses   Final diagnoses:  Acute respiratory failure with hypoxia and hypercarbia (HCC)  COPD exacerbation (HCC)      Linwood Dibbles, MD 12/16/16 2309

## 2016-12-16 NOTE — ED Triage Notes (Signed)
Pt arrives vis [facility] with resp distress onset tonight.  Pt arrives on bipap, has received 3 nebs, 125 solumedrol,

## 2016-12-16 NOTE — H&P (Signed)
History and Physical    Sheila Pierce ZOX:[id number] DOB: 02/13/1945 DOA: 12/16/2016  PCP: Eustace MooreNelson, [name], MD   Patient coming from: Home.  I have personally briefly reviewed patient's [facility]  Chief Complaint: Shortness of breath.  HPI: Sheila Pierce is a 71 y.o. female with medical history significant of allergies/asthma, COPD/emphysema, previous tobacco abuse, anemia, anxiety, depression, osteoarthritis, cataracts, macular degeneration, chronic diastolic CHF, chronic kidney disease, type 2 diabetes, GERD, hypercholesterolemia, hypertension, trigeminal neuralgia, history of non-STEMI with nonobstructive CAD on cath in 2014 who is brought to the emergency department via EMS due to progressively worse shortness of breath associated with wheezing and dry cough since earlier today.  Per patient, there were a lot of relatives at her house earlier today for the Thanksgiving meal, so she was rushing and felt a little tired.  She subsequently took a 3-hour nap and when she woke up became acutely dyspneic.  She subsequently called EMS because she was having a lot of difficulty breathing.  When EMS arrived they started her on CPAP, gave her 3 bronchodilator treatments and 125 mg of Solu-Medrol.  She denies fever, chills, headache, sore throat, productive cough, pleurisy, chest pain, palpitations, diaphoresis, PND, orthopnea or pitting edema of the lower extremities.  She mentions that she has felt occasionally dizzy in recent days.  Denies abdominal pain, nausea, emesis, diarrhea, constipation, melena or hematochezia.  Denies GU symptoms..  ED Course: Initial vital signs temperature 97.32F, pulse 111, respirations 28, blood pressure 157/100 mmHg and O2 sat was 100% on BiPAP.  Review of Systems: As per HPI otherwise 10 point review of systems negative.    Past Medical History:  Diagnosis Date  . Allergy   . Anemia   . Anxiety   . Arthritis   . Asthma   .  Cataract   . Chronic diastolic CHF (congestive heart failure) (HCC) 10/30/2016  . Chronic kidney disease   . COPD (chronic obstructive pulmonary disease) (HCC)    Chronic resp failure on home O2  . Depression   . Diabetes mellitus without complication (HCC)   . Emphysema of lung (HCC)   . GERD (gastroesophageal reflux disease)   . Hypercholesteremia 12/16/2015  . Hyperglycemia    Noted 09/2011 admission in setting of steroid use with normal HgbA1C.  Marland Kitchen. Hypertension   . Hypokalemia   . Hyponatremia    Noted 09/2011 admission.  . NSTEMI (non-ST elevated myocardial infarction) (HCC)    a. 09/2011 in setting of acute on chronic resp failure/COPD exacerbation --> cath demonstrated nonobstructive CAD 10/05/11 with EF 50%;  08/2012 elevated Ti in setting of COPD flare -->Echo: EF 60-65%, Gr 1 DD -->Med Rx.  . Osteoporosis   . Oxygen deficiency   . QT prolongation    Noted on EKG 09/2011 (590 in setting of K of 3, improved to 475 by discharge)  . Tobacco abuse 09/19/2012  . Trigeminal neuralgia     Past Surgical History:  Procedure Laterality Date  . ABDOMINAL HYSTERECTOMY     bleeding  . CHOLECYSTECTOMY    . LEFT HEART CATHETERIZATION WITH CORONARY ANGIOGRAM N/A 10/05/2011   Procedure: LEFT HEART CATHETERIZATION WITH CORONARY ANGIOGRAM;  Surgeon: Tonny BollmanMichael Cooper, MD;  [facility];  Service: Cardiovascular;  Laterality: N/A;  . PLANTAR'S WART EXCISION       reports that she quit smoking about 2 years ago. Her smoking use included cigarettes. She started smoking about 52 years ago. She has a  40.00 pack-year smoking history. she has never used smokeless tobacco. She reports that she does not drink alcohol or use drugs.  Allergies  Allergen Reactions  . Penicillins Swelling and Other (See Comments)    Reaction:  Unspecified swelling reaction Has patient had a PCN reaction causing immediate rash, facial/tongue/throat swelling, SOB or lightheadedness with hypotension: Yes Has patient had  a PCN reaction causing severe rash involving mucus membranes or skin necrosis: No Has patient had a PCN reaction that required hospitalization No Has patient had a PCN reaction occurring within the last 10 years: Yes If all of the above answers are "NO", then may proceed with Cephalosporin use.  . Sulfa Antibiotics Hives and Other (See Comments)    Reaction:  Hallucinations   . Codeine Nausea Only  . Hydrocodone Nausea Only  . Levaquin [Levofloxacin] Itching, Swelling and Rash    Family History  Problem Relation Age of Onset  . Cancer Father        Lung  . Cancer Mother        Liver  . Hypertension Son   . Hyperlipidemia Son   . Post-traumatic stress disorder Son   . Depression Son   . Kidney disease Brother        liver and kidney failure  . Hypertension Son   . Hyperlipidemia Son   . Cancer Other        colon  . Diabetes Son   . Lung disease Daughter        passed away at 74 months old    Prior to Admission medications   Medication Sig Start Date End Date Taking? Authorizing Provider  acetaZOLAMIDE (DIAMOX) 250 MG tablet TAKE ONE TABLET BY MOUTH TWICE DAILY 11/19/16  Yes [name], MD  aspirin EC 81 MG tablet Take 81 mg by mouth daily.   Yes [provider]  BREO ELLIPTA 100-25 MCG/INH AEPB Inhale 1 puff into the lungs daily. 11/11/15  Yes [name], MD  busPIRone (BUSPAR) 15 MG tablet TAKE 1 TABLET BY MOUTH TWICE DAILY 05/31/16  Yes [name], MD  cholecalciferol (VITAMIN D) 1000 units tablet Take 1,000 Units by mouth daily.   Yes [provider]  ipratropium-albuterol (DUONEB) 0.5-2.5 (3) MG/3ML SOLN Take 3 mLs by nebulization every 4 (four) hours. 11/11/15  Yes [name], MD  lisinopril-hydrochlorothiazide (PRINZIDE,ZESTORETIC) 10-12.5 MG tablet TAKE 1 TABLET BY MOUTH EVERY DAY 05/31/16  Yes [name], MD  metoprolol tartrate (LOPRESSOR) 25 MG tablet TAKE 1 TABLET BY MOUTH TWICE DAILY 05/31/16  Yes [name], MD  Multiple Vitamin (MULTIVITAMIN WITH MINERALS) TABS tablet Take 1 tablet by mouth daily.   Yes [provider]  OXYGEN Inhale 3 L into the lungs continuous.    Yes [provider]  potassium chloride (K-DUR) 10 MEQ tablet Take 1 tablet (10 mEq total) by mouth daily. 03/25/16  Yes [name], MD  tiotropium (SPIRIVA) 18 MCG inhalation capsule Place 18 mcg into inhaler and inhale daily.   Yes [provider]  traMADol (ULTRAM) 50 MG tablet Take 1 tablet (50 mg total) by mouth every 6 (six) hours as needed for moderate pain or severe pain. 11/25/16  Yes [name], MD  VENTOLIN HFA 108 [phone] Base) MCG/ACT inhaler INHALE 2 PUFFS EVERY FOUR HOURS AS NEEDED FOR WHEEZING OR SHORTNESS OF BREATH 10/07/16  Yes [name], MD  ciprofloxacin (CIPRO) 250 MG tablet Take 1 tablet (250 mg total) by  mouth 2 (two) times daily. Patient not taking: Reported on 12/16/2016 11/24/16   [name], MD  predniSONE (DELTASONE) 10 MG tablet Take 1 tablet (10 mg total) by mouth daily with breakfast. Take 6 tablets today and then decrease by 1 tablet daily until none are left. Patient not taking: Reported on 12/16/2016 11/02/16   Philip Aspen, Limmie Patricia, MD    Physical Exam: Vitals:   12/16/16 2152 12/16/16 2153 12/16/16 2200 12/16/16 2300  BP: (!) 157/100   108/77  Pulse: (!) 111  (!) 111 (!) 110  Resp: (!) 28  (!) 27 19  Temp: (!) 97.5 F (36.4 C)     TempSrc: Oral     SpO2: 100%  100% 100%  Weight:  66.2 kg (146 lb)    Height:  5' (1.524 m)      Constitutional: NAD, calm, comfortable Eyes: PERRL, lids and conjunctivae normal ENMT: BiPAP mask in place.  Mucous membranes are dry. Posterior pharynx clear of any exudate or lesions. Neck: normal, supple, no masses, no thyromegaly Respiratory: On BiPAP ventilation.  Significantly decreased breath sounds with distant respiratory wheezing bilaterally.  No rhonchi.  No accessory muscle use. Cardiovascular:  Tachycardic with a regular rate 111 bpm, no murmurs / rubs / gallops. No extremity edema. 2+ pedal pulses. No carotid bruits.  Abdomen: Soft, no tenderness, no masses palpated. No hepatosplenomegaly. Bowel sounds positive.  Musculoskeletal: no clubbing / cyanosis. Good ROM, no contractures. Normal muscle tone.  Skin: Positive ecchymosis areas on the upper extremities. Neurologic: CN 2-12 grossly intact. Sensation intact, DTR normal. Strength 5/5 in all 4.  Psychiatric: Normal judgment and insight. Alert and oriented x 3. Normal mood.    Labs on Admission: I have personally reviewed following labs and imaging studies  CBC: Recent Labs  Lab 12/16/16 2153  WBC 8.0  HGB 11.1*  HCT 38.5  MCV 96.3  PLT 264   Basic Metabolic Panel: Recent Labs  Lab 12/16/16 2153  NA 135  K 3.9  CL 95*  CO2 35*  GLUCOSE 159*  BUN 23*  CREATININE 1.46*  CALCIUM 10.3   GFR: Estimated Creatinine Clearance: 29.6 mL/min (A) (by C-G formula based on SCr of 1.46 mg/dL (H)). Liver Function Tests: No results for input(s): AST, ALT, ALKPHOS, BILITOT, PROT, ALBUMIN in the last 168 hours. No results for input(s): LIPASE, AMYLASE in the last 168 hours. No results for input(s): AMMONIA in the last 168 hours. Coagulation Profile: No results for input(s): INR, PROTIME in the last 168 hours. Cardiac Enzymes: No results for input(s): CKTOTAL, CKMB, CKMBINDEX, TROPONINI in the last 168 hours. BNP (last 3 results) No results for input(s): PROBNP in the last 8760 hours. HbA1C: No results for input(s): HGBA1C in the last 72 hours. CBG: No results for input(s): GLUCAP in the last 168 hours. Lipid Profile: No results for input(s): CHOL, HDL, LDLCALC, TRIG, CHOLHDL, LDLDIRECT in the last 72 hours. Thyroid Function Tests: No results for input(s): TSH, T4TOTAL, FREET4, T3FREE, THYROIDAB in the last 72 hours. Anemia Panel: No results for input(s): VITAMINB12, FOLATE, FERRITIN, TIBC, IRON, RETICCTPCT in the last 72  hours. Urine analysis:    Component Value Date/Time   COLORURINE YELLOW 04/16/2016 1920   APPEARANCEUR HAZY (A) 04/16/2016 1920   LABSPEC 1.006 04/16/2016 1920   PHURINE 6.0 04/16/2016 1920   GLUCOSEU NEGATIVE 04/16/2016 1920   HGBUR SMALL (A) 04/16/2016 1920   BILIRUBINUR neg 11/24/2016 1131   KETONESUR NEGATIVE 04/16/2016 1920   PROTEINUR neg 11/24/2016 1131  PROTEINUR NEGATIVE 04/16/2016 1920   UROBILINOGEN 0.2 11/24/2016 1131   NITRITE neg 11/24/2016 1131   NITRITE POSITIVE (A) 04/16/2016 1920   LEUKOCYTESUR Small (1+) (A) 11/24/2016 1131    Radiological Exams on Admission: Dg Chest Port 1 View  Result Date: 12/16/2016 CLINICAL DATA:  Shortness of breath tonight. EXAM: PORTABLE CHEST 1 VIEW COMPARISON:  Radiograph 11/09/2016 FINDINGS: Unchanged heart size and mediastinal contours. Aortic arch atherosclerosis. Increased vascular congestion. Mild peribronchial cuffing may be pulmonary edema. Small bilateral pleural effusions. Streaky bibasilar atelectasis. No pneumothorax. IMPRESSION: Mild CHF with vascular congestion, small pleural effusions and mild peribronchial thickening suspicious for pulmonary edema. Electronically Signed   By: Rubye Oaks M.D.   On: 12/16/2016 22:22   09/17/2012 echocardiogram  ------------------------------------------------------------ LV EF: 60% -  65%  ------------------------------------------------------------ Indications:   MI - acute 410.91.  ------------------------------------------------------------ History:  PMH:  Dyspnea. Coronary artery disease. Chronic obstructive pulmonary disease. Risk factors: Current tobacco use. Hypertension. Dyslipidemia.  ------------------------------------------------------------ Study Conclusions  - Left ventricle: The cavity size was normal. Wall thickness was normal. Systolic function was normal. The estimated ejection fraction was in the range of 60% to 65%. Wall motion was normal;  there were no regional wall motion abnormalities. Doppler parameters are consistent with abnormal left ventricular relaxation (grade 1 diastolic dysfunction). - Atrial septum: There was increased thickness of the septum, consistent with lipomatous hypertrophy.  EKG: Independently reviewed.  Vent. rate 108 BPM PR interval * ms QRS duration 89 ms QT/QTc 333/447 ms P-R-T axes 80 82 78 Sinus tachycardia Consider right atrial enlargement Borderline right axis deviation Nonspecific repol abnormality, diffuse leads Baseline wander in lead(s) V3  Assessment/Plan Principal Problem:   Acute respiratory failure (HCC)   COPD exacerbation (HCC) Admit to stepdown/inpatient. Continue supplemental oxygen. Continue BiPAP ventilation. Continue as scheduled and as needed bronchodilators. Continue Solu-Medrol 40 mg IVP every 6 hours x4 doses.  Active Problems:   Essential hypertension Hold hydrochlorothiazide and metoprolol due to hypotension. Monitor blood pressure, heart rate, renal function and electrolytes.    CAD (coronary artery disease) Continue aspirin 81 mg p.o. daily. Continue metoprolol 25 mg p.o. twice daily.    Anemia Anemia panel was normal in October 2017. Monitor hematocrit and hemoglobin.    Anxiety Continue BuSpar 15 mg p.o. twice daily.    Diabetes mellitus, stable (HCC) Carbohydrate modified diet. Check hemoglobin A1c in a.m. CBG monitoring with regular insulin sliding scale while in the hospital.    Hypercholesteremia Currently not on medical therapy.    GERD (gastroesophageal reflux disease) Protonix 40 mg p.o. daily.   DVT prophylaxis: Lovenox SQ. Code Status: DNR. Family Communication:  Disposition Plan: Admit for respiratory failure, COPD and CHF exacerbation treatment. Consults called:  Admission status: Inpatient/ICU   [name] Mo MD [facility] Pager [phone].  If 7PM-7AM, please contact  night-coverage [email or web address] Password [facility]  12/16/2016, 11:45 PM

## 2016-12-17 ENCOUNTER — Inpatient Hospital Stay (HOSPITAL_COMMUNITY): Payer: Medicare HMO

## 2016-12-17 ENCOUNTER — Other Ambulatory Visit: Payer: Self-pay

## 2016-12-17 DIAGNOSIS — J441 Chronic obstructive pulmonary disease with (acute) exacerbation: Principal | ICD-10-CM

## 2016-12-17 DIAGNOSIS — J9621 Acute and chronic respiratory failure with hypoxia: Secondary | ICD-10-CM

## 2016-12-17 DIAGNOSIS — I10 Essential (primary) hypertension: Secondary | ICD-10-CM

## 2016-12-17 DIAGNOSIS — N183 Chronic kidney disease, stage 3 unspecified: Secondary | ICD-10-CM

## 2016-12-17 DIAGNOSIS — E1122 Type 2 diabetes mellitus with diabetic chronic kidney disease: Secondary | ICD-10-CM

## 2016-12-17 DIAGNOSIS — J9622 Acute and chronic respiratory failure with hypercapnia: Secondary | ICD-10-CM

## 2016-12-17 DIAGNOSIS — I361 Nonrheumatic tricuspid (valve) insufficiency: Secondary | ICD-10-CM

## 2016-12-17 HISTORY — DX: Chronic kidney disease, stage 3 unspecified: N18.30

## 2016-12-17 HISTORY — DX: Type 2 diabetes mellitus with diabetic chronic kidney disease: E11.22

## 2016-12-17 LAB — MRSA PCR SCREENING: MRSA by PCR: NEGATIVE

## 2016-12-17 LAB — RESPIRATORY PANEL BY PCR
ADENOVIRUS-RVPPCR: NOT DETECTED
Bordetella pertussis: NOT DETECTED
CORONAVIRUS 229E-RVPPCR: NOT DETECTED
CORONAVIRUS HKU1-RVPPCR: NOT DETECTED
CORONAVIRUS OC43-RVPPCR: NOT DETECTED
Chlamydophila pneumoniae: NOT DETECTED
Coronavirus NL63: NOT DETECTED
INFLUENZA B-RVPPCR: NOT DETECTED
Influenza A: NOT DETECTED
MYCOPLASMA PNEUMONIAE-RVPPCR: NOT DETECTED
Metapneumovirus: NOT DETECTED
PARAINFLUENZA VIRUS 1-RVPPCR: NOT DETECTED
Parainfluenza Virus 2: NOT DETECTED
Parainfluenza Virus 3: NOT DETECTED
Parainfluenza Virus 4: NOT DETECTED
RESPIRATORY SYNCYTIAL VIRUS-RVPPCR: NOT DETECTED
Rhinovirus / Enterovirus: NOT DETECTED

## 2016-12-17 LAB — GLUCOSE, CAPILLARY
GLUCOSE-CAPILLARY: 167 mg/dL — AB (ref 65–99)
GLUCOSE-CAPILLARY: 117 mg/dL — AB (ref 65–99)
GLUCOSE-CAPILLARY: 153 mg/dL — AB (ref 65–99)
Glucose-Capillary: 216 mg/dL — ABNORMAL HIGH (ref 65–99)

## 2016-12-17 LAB — BASIC METABOLIC PANEL
ANION GAP: 11 (ref 5–15)
BUN: 25 mg/dL — ABNORMAL HIGH (ref 6–20)
CALCIUM: 9.7 mg/dL (ref 8.9–10.3)
CHLORIDE: 95 mmol/L — AB (ref 101–111)
CO2: 28 mmol/L (ref 22–32)
Creatinine, Ser: 1.44 mg/dL — ABNORMAL HIGH (ref 0.44–1.00)
GFR calc non Af Amer: 35 mL/min — ABNORMAL LOW (ref >60)
GFR, EST AFRICAN AMERICAN: 41 mL/min — AB (ref >60)
Glucose, Bld: 185 mg/dL — ABNORMAL HIGH (ref 65–99)
Potassium: 3.7 mmol/L (ref 3.5–5.1)
Sodium: 134 mmol/L — ABNORMAL LOW (ref 135–145)

## 2016-12-17 LAB — ECHOCARDIOGRAM COMPLETE
HEIGHTINCHES: 60 in
Weight: 2336 oz

## 2016-12-17 MED ORDER — ASPIRIN EC 81 MG PO TBEC
81.0000 mg | DELAYED_RELEASE_TABLET | Freq: Every day | ORAL | Status: DC
  Administered 2016-12-17 – 2016-12-21 (×5): 81 mg via ORAL
  Filled 2016-12-17 (×5): qty 1

## 2016-12-17 MED ORDER — MIDODRINE HCL 5 MG PO TABS
10.0000 mg | ORAL_TABLET | Freq: Once | ORAL | Status: AC
  Administered 2016-12-17: 10 mg via ORAL
  Filled 2016-12-17: qty 2

## 2016-12-17 MED ORDER — ENOXAPARIN SODIUM 30 MG/0.3ML ~~LOC~~ SOLN
30.0000 mg | Freq: Every day | SUBCUTANEOUS | Status: DC
  Administered 2016-12-17 (×2): 30 mg via SUBCUTANEOUS
  Filled 2016-12-17 (×2): qty 0.3

## 2016-12-17 MED ORDER — METHYLPREDNISOLONE SODIUM SUCC 40 MG IJ SOLR: 40.0000 mg | Freq: Two times a day (BID) | INTRAMUSCULAR | Status: DC

## 2016-12-17 MED ORDER — METOPROLOL TARTRATE 25 MG PO TABS: 25.0000 mg | ORAL_TABLET | Freq: Two times a day (BID) | ORAL | Status: DC

## 2016-12-17 MED ORDER — METOPROLOL TARTRATE 25 MG PO TABS
12.5000 mg | ORAL_TABLET | Freq: Two times a day (BID) | ORAL | Status: DC
  Administered 2016-12-17 – 2016-12-20 (×7): 12.5 mg via ORAL
  Filled 2016-12-17 (×7): qty 1

## 2016-12-17 MED ORDER — ADULT MULTIVITAMIN W/MINERALS CH
1.0000 | ORAL_TABLET | Freq: Every day | ORAL | Status: DC
  Administered 2016-12-17 – 2016-12-21 (×5): 1 via ORAL
  Filled 2016-12-17 (×5): qty 1

## 2016-12-17 MED ORDER — METHYLPREDNISOLONE SODIUM SUCC 40 MG IJ SOLR
40.0000 mg | Freq: Four times a day (QID) | INTRAMUSCULAR | Status: DC
  Administered 2016-12-17 – 2016-12-18 (×7): 40 mg via INTRAVENOUS
  Filled 2016-12-17 (×7): qty 1

## 2016-12-17 MED ORDER — GUAIFENESIN ER 600 MG PO TB12
1200.0000 mg | ORAL_TABLET | Freq: Two times a day (BID) | ORAL | Status: DC | PRN
  Administered 2016-12-17 – 2016-12-18 (×2): 1200 mg via ORAL
  Filled 2016-12-17 (×2): qty 2

## 2016-12-17 MED ORDER — LISINOPRIL-HYDROCHLOROTHIAZIDE 10-12.5 MG PO TABS: 1.0000 | ORAL_TABLET | Freq: Every day | ORAL | Status: DC

## 2016-12-17 MED ORDER — LISINOPRIL 10 MG PO TABS: 10.0000 mg | ORAL_TABLET | Freq: Every day | ORAL | Status: DC

## 2016-12-17 MED ORDER — HYDROCHLOROTHIAZIDE 12.5 MG PO CAPS: 12.5000 mg | ORAL_CAPSULE | Freq: Every day | ORAL | Status: DC

## 2016-12-17 MED ORDER — IPRATROPIUM-ALBUTEROL 0.5-2.5 (3) MG/3ML IN SOLN
3.0000 mL | Freq: Four times a day (QID) | RESPIRATORY_TRACT | Status: DC
  Administered 2016-12-17 – 2016-12-18 (×7): 3 mL via RESPIRATORY_TRACT
  Filled 2016-12-17 (×6): qty 3

## 2016-12-17 MED ORDER — PANTOPRAZOLE SODIUM 40 MG PO TBEC
40.0000 mg | DELAYED_RELEASE_TABLET | Freq: Every day | ORAL | Status: DC
  Administered 2016-12-17 – 2016-12-21 (×5): 40 mg via ORAL
  Filled 2016-12-17 (×5): qty 1

## 2016-12-17 MED ORDER — MORPHINE SULFATE (CONCENTRATE) 10 MG/0.5ML PO SOLN
1.0000 mg | ORAL | Status: DC | PRN
  Administered 2016-12-17 – 2016-12-18 (×4): 1 mg via ORAL
  Filled 2016-12-17 (×4): qty 0.5

## 2016-12-17 MED ORDER — BUSPIRONE HCL 5 MG PO TABS
15.0000 mg | ORAL_TABLET | Freq: Two times a day (BID) | ORAL | Status: DC
  Administered 2016-12-17 – 2016-12-21 (×10): 15 mg via ORAL
  Filled 2016-12-17 (×11): qty 3

## 2016-12-17 MED ORDER — VITAMIN D 1000 UNITS PO TABS
1000.0000 [IU] | ORAL_TABLET | Freq: Every day | ORAL | Status: DC
  Administered 2016-12-17 – 2016-12-21 (×5): 1000 [IU] via ORAL
  Filled 2016-12-17 (×5): qty 1

## 2016-12-17 MED ORDER — INSULIN ASPART 100 UNIT/ML ~~LOC~~ SOLN
0.0000 [IU] | Freq: Three times a day (TID) | SUBCUTANEOUS | Status: DC
  Administered 2016-12-17: 2 [IU] via SUBCUTANEOUS
  Administered 2016-12-17: 3 [IU] via SUBCUTANEOUS
  Administered 2016-12-18 – 2016-12-19 (×4): 1 [IU] via SUBCUTANEOUS
  Administered 2016-12-19 – 2016-12-20 (×4): 2 [IU] via SUBCUTANEOUS

## 2016-12-17 MED ORDER — SODIUM CHLORIDE 0.9 % IV BOLUS (SEPSIS)
500.0000 mL | Freq: Once | INTRAVENOUS | Status: AC
  Administered 2016-12-17: 500 mL via INTRAVENOUS

## 2016-12-17 MED ORDER — BUDESONIDE 0.5 MG/2ML IN SUSP
0.5000 mg | Freq: Two times a day (BID) | RESPIRATORY_TRACT | Status: DC
  Administered 2016-12-17 – 2016-12-21 (×9): 0.5 mg via RESPIRATORY_TRACT
  Filled 2016-12-17 (×9): qty 2

## 2016-12-17 MED ORDER — POTASSIUM CHLORIDE CRYS ER 10 MEQ PO TBCR
10.0000 meq | EXTENDED_RELEASE_TABLET | Freq: Every day | ORAL | Status: DC
  Administered 2016-12-17 – 2016-12-18 (×2): 10 meq via ORAL
  Filled 2016-12-17 (×3): qty 1

## 2016-12-17 MED ORDER — ACETAZOLAMIDE 250 MG PO TABS
250.0000 mg | ORAL_TABLET | Freq: Two times a day (BID) | ORAL | Status: DC
  Administered 2016-12-17: 250 mg via ORAL
  Filled 2016-12-17 (×3): qty 1

## 2016-12-17 NOTE — Care Management Important Message (Signed)
Important Message  Patient Details  Name: Sheila Pierce MRN: [id number] Date of Birth: 1945/02/12   Medicare Important Message Given:  Yes    Malcolm Metro, RN 12/17/2016, 2:39 PM

## 2016-12-17 NOTE — Progress Notes (Signed)
PROGRESS NOTE  Sheila Pierce [id number] DOB: Apr 01, 1945 DOA: 12/16/2016 PCP: [name], MD  Brief History:  71 year old female with a history of COPD/emphysema, previous tobacco abuse, anxiety/depression, chronic diastolic CHF, CKD stage III, diabetes mellitus, hyperlipidemia, essential hypertension, and history of non-STEMI with nonobstructive CAD on cath in 2014 who is brought to the emergency department via EMS due to progressively worse shortness of breath associated with wheezing and nonproductive cough that began in the afternoon of December 16, 2016.  The patient quit smoking over 5 years ago.  She denies any fevers, chills, chest pain, hemoptysis, nausea, vomiting, diarrhea, abdominal pain.  The patient was recently admitted to [facility] from November 13, 2016 through November 16, 2016 for COPD exacerbation.  She was discharged with a prednisone taper.  In addition, she also had admission from October 31, 2015 through November 03, 2015 for COPD exacerbation for which she required BiPAP for short period of time.  Upon presentation, the patient was placed on BiPAP and was given Solu-Medrol 125 mg IV x1 and furosemide 40 mg IV x1.  The patient was subsequently weaned off of BiPAP on the morning of December 17, 2016.    Assessment/Plan: Acute on chronic respiratory failure with hypoxia and hypercarbia -Secondary to COPD exacerbation -Weaned off of BiPAP December 17, 2016 -Wean back to baseline 3 L for oxygen saturation greater than 92%  COPD exacerbation -Start Pulmicort nebulizers -Continue duo nebs -Check respiratory viral panel -Continue IV Solu-Medrol  CKD stage III -Baseline creatinine 1.2-1.4 -A.m. BMP  Essential hypertension  -holding HCTZ, metoprolol succinate, lisinopril secondary to hypotension after furosemide administration  Diabetes mellitus type 2, controlled -November 11, 2016 hemoglobin A1c 5.2 -Elevated CBG secondary to steroids -NovoLog sliding  scale while on steroids  Coronary artery disease with history NSTEMI -No chest pain presently -personally reviewed EKG--sinus rhythm, nonspecific T wave changes  Lower extremity pain -Venous duplex to rule out DVT  Anxiety/depression -Continue home dose of BuSpar -Patient has a history of situational anxiety  GERD -Continue PPI    Disposition Plan:   Home in 2-3 days  Family Communication:   No Family at bedside  Consultants:  none  Code Status:  DNR  DVT Prophylaxis:  [place] Lovenox   Procedures: As Listed in Progress Note Above  Antibiotics: None    Subjective: Patient is breathing a little bit better today.  She still has some dyspnea with minimal exertion.  Denies any fevers, chills, headache, chest pain, nausea, vomiting, diarrhea, abdominal pain.  No dysuria or hematuria.  No rashes.  Objective: Vitals:   12/17/16 0515 12/17/16 0530 12/17/16 0600 12/17/16 0700  BP: 124/63 119/68 (!) 98/58 (!) 88/49  Pulse: 98 (!) 104 100 99  Resp: 13 19 15 19   Temp:      TempSrc:      SpO2: 99% 100% 100% 100%  Weight:      Height:        Intake/Output Summary (Last 24 hours) at 12/17/2016 0805 Last data filed at 12/17/2016 0006 Gross per 24 hour  Intake -  Output 200 ml  Net -200 ml   Weight change:  Exam:   General:  Pt is alert, follows commands appropriately, not in acute distress  HEENT: No icterus, No thrush, No neck mass, [place]/AT  Cardiovascular: RRR, S1/S2, no rubs, no gallops  Respiratory: Bilateral scattered rales.  Diminished breath sounds bilateral.  Minimal wheeze.  Abdomen: Soft/+BS, non tender, non distended,  no guarding  Extremities: trace LE edema, No lymphangitis, No petechiae, No rashes, no synovitis   Data Reviewed: I have personally reviewed following labs and imaging studies Basic Metabolic Panel: Recent Labs  Lab 12/16/16 2153 12/17/16 0553  NA 135 134*  K 3.9 3.7  CL 95* 95*  CO2 35* 28  GLUCOSE 159* 185*  BUN 23* 25*    CREATININE 1.46* 1.44*  CALCIUM 10.3 9.7   Liver Function Tests: No results for input(s): AST, ALT, ALKPHOS, BILITOT, PROT, ALBUMIN in the last 168 hours. No results for input(s): LIPASE, AMYLASE in the last 168 hours. No results for input(s): AMMONIA in the last 168 hours. Coagulation Profile: No results for input(s): INR, PROTIME in the last 168 hours. CBC: Recent Labs  Lab 12/16/16 2153  WBC 8.0  HGB 11.1*  HCT 38.5  MCV 96.3  PLT 264   Cardiac Enzymes: No results for input(s): CKTOTAL, CKMB, CKMBINDEX, TROPONINI in the last 168 hours. BNP: Invalid input(s): POCBNP CBG: Recent Labs  Lab 12/17/16 0801  GLUCAP 167*   HbA1C: No results for input(s): HGBA1C in the last 72 hours. Urine analysis:    Component Value Date/Time   COLORURINE YELLOW 04/16/2016 1920   APPEARANCEUR HAZY (A) 04/16/2016 1920   LABSPEC 1.006 04/16/2016 1920   PHURINE 6.0 04/16/2016 1920   GLUCOSEU NEGATIVE 04/16/2016 1920   HGBUR SMALL (A) 04/16/2016 1920   BILIRUBINUR neg 11/24/2016 1131   KETONESUR NEGATIVE 04/16/2016 1920   PROTEINUR neg 11/24/2016 1131   PROTEINUR NEGATIVE 04/16/2016 1920   UROBILINOGEN 0.2 11/24/2016 1131   NITRITE neg 11/24/2016 1131   NITRITE POSITIVE (A) 04/16/2016 1920   LEUKOCYTESUR Small (1+) (A) 11/24/2016 1131   Sepsis Labs: @LABRCNTIP (procalcitonin:4,lacticidven:4) )No results found for this or any previous visit (from the past 240 hour(s)).   Scheduled Meds: . acetaZOLAMIDE  250 mg Oral BID  . aspirin EC  81 mg Oral Daily  . busPIRone  15 mg Oral BID  . cholecalciferol  1,000 Units Oral Daily  . enoxaparin (LOVENOX) injection  30 mg Subcutaneous QHS  . [START ON 12/18/2016] hydrochlorothiazide  12.5 mg Oral Daily  . insulin aspart  0-9 Units Subcutaneous TID WC  . ipratropium-albuterol  3 mL Nebulization Q6H  . [START ON 12/18/2016] lisinopril  10 mg Oral Daily  . methylPREDNISolone (SOLU-MEDROL) injection  40 mg Intravenous Q6H  . metoprolol  tartrate  25 mg Oral BID  . multivitamin with minerals  1 tablet Oral Daily  . pantoprazole  40 mg Oral Daily  . potassium chloride  10 mEq Oral Daily   Continuous Infusions: . sodium chloride 500 mL (12/17/16 0534)    Procedures/Studies: Dg Chest Port 1 View  Result Date: 12/16/2016 CLINICAL DATA:  Shortness of breath tonight. EXAM: PORTABLE CHEST 1 VIEW COMPARISON:  Radiograph 11/09/2016 FINDINGS: Unchanged heart size and mediastinal contours. Aortic arch atherosclerosis. Increased vascular congestion. Mild peribronchial cuffing may be pulmonary edema. Small bilateral pleural effusions. Streaky bibasilar atelectasis. No pneumothorax. IMPRESSION: Mild CHF with vascular congestion, small pleural effusions and mild peribronchial thickening suspicious for pulmonary edema. Electronically Signed   By: Rubye OaksMelanie  Ehinger M.D.   On: 12/16/2016 22:22    Wyndi Northrup, DO  [facility] Pager [phone]  If 7PM-7AM, please contact night-coverage [email or web address] Password [email or web address] 12/17/2016, 8:05 AM   LOS: 1 day

## 2016-12-17 NOTE — Progress Notes (Signed)
Nutrition Brief Note  Patient screened as part of COPD Gold protocol.   Wt Readings from Last 15 Encounters:  12/16/16 146 lb (66.2 kg)  11/02/16 143 lb 8 oz (65.1 kg)  10/17/16 140 lb (63.5 kg)  09/16/16 142 lb 10.2 oz (64.7 kg)  08/27/16 140 lb (63.5 kg)  06/02/16 140 lb (63.5 kg)  05/20/16 143 lb (64.9 kg)  05/16/16 142 lb 3.2 oz (64.5 kg)  04/22/16 142 lb (64.4 kg)  04/16/16 135 lb (61.2 kg)  04/08/16 140 lb 1.9 oz (63.6 kg)  03/18/16 139 lb 1.3 oz (63.1 kg)  01/07/16 140 lb 1.3 oz (63.5 kg)  12/19/15 138 lb 14.2 oz (63 kg)  12/16/15 139 lb (63 kg)   On MST, patient denied any weight loss or decrease in appetite. She confirms this again today. She says she feel she doesn't eat much at baseline, but this is nothing new. She denies her COPD impacting her PO intake and denies any weight loss. Per chart, her weight has been quite stable over the past year,.  She says she did not eat much lunch because "it didn't have any taste". She wants to know what items are available to her. RD will ask ambassador to follow up prior to dinner. She requests Jello right now and RD brought her some.   No nutrition interventions warranted at this time. If nutrition issues arise, please consult RD.   Christophe Louis RD, LDN, CNSC Clinical Nutrition Pager: [phone] 12/17/2016 1:18 PM

## 2016-12-17 NOTE — Progress Notes (Signed)
Respiratory Panel by PCR all negative, droplet precautions d/c. MD notified.

## 2016-12-17 NOTE — Care Management Note (Addendum)
Case Management Note  Patient Details  Name: Sheila Pierce MRN: [id number] Date of Birth: [birth year]  Subjective/Objective:             Admitted with resp failure. CM consult for HH/DME needs. Pt is from home, lives with two sons and "other people". Pt is able to ambulate short distances and has WC for long distances. She has BSC she uses also. She is on home oxygen 3lpm continuously. She reports being confined to her bedroom because the "other people" that live in the home smoke even though her sons tell them not to. Pt plans to move to another residence with her two sons soon. She has had HH in the past and was not happy. Sons asked PT to leave and not come back. Pt says her only need at DC is a scooter to help her mobilize in the store. She says there is never enough motorized carts and her son has to push the cart and her WC.  CM explained this was not something we could arrange from the hospital and suggest she speak with her PCP about options on her next visit. Pt has no other needs or concerns about DC plan.      Action/Plan: DC home with self care. CM will sign off, may be re-consulted if needs arise.   Expected Discharge Date:      12/19/2016            Expected Discharge Plan:  Home/Self Care  In-House Referral:  NA  Discharge planning Services  CM Consult  Post Acute Care Choice:  NA Choice offered to:  NA  Status of Service:  Completed, signed off  Malcolm Metro, RN 12/17/2016, 3:31 PM

## 2016-12-17 NOTE — Progress Notes (Signed)
*  PRELIMINARY RESULTS* Echocardiogram 2D Echocardiogram has been performed.  Jeryl Columbia 12/17/2016, 10:34 AM

## 2016-12-18 ENCOUNTER — Inpatient Hospital Stay (HOSPITAL_COMMUNITY): Payer: Medicare HMO

## 2016-12-18 DIAGNOSIS — E7800 Pure hypercholesterolemia, unspecified: Secondary | ICD-10-CM

## 2016-12-18 DIAGNOSIS — I5032 Chronic diastolic (congestive) heart failure: Secondary | ICD-10-CM

## 2016-12-18 LAB — BASIC METABOLIC PANEL
ANION GAP: 8 (ref 5–15)
BUN: 31 mg/dL — ABNORMAL HIGH (ref 6–20)
CALCIUM: 9.8 mg/dL (ref 8.9–10.3)
CO2: 30 mmol/L (ref 22–32)
Chloride: 96 mmol/L — ABNORMAL LOW (ref 101–111)
Creatinine, Ser: 1.28 mg/dL — ABNORMAL HIGH (ref 0.44–1.00)
GFR, EST AFRICAN AMERICAN: 47 mL/min — AB (ref >60)
GFR, EST NON AFRICAN AMERICAN: 41 mL/min — AB (ref >60)
GLUCOSE: 145 mg/dL — AB (ref 65–99)
POTASSIUM: 4.6 mmol/L (ref 3.5–5.1)
Sodium: 134 mmol/L — ABNORMAL LOW (ref 135–145)

## 2016-12-18 LAB — GLUCOSE, CAPILLARY
GLUCOSE-CAPILLARY: 128 mg/dL — AB (ref 65–99)
GLUCOSE-CAPILLARY: 140 mg/dL — AB (ref 65–99)
GLUCOSE-CAPILLARY: 137 mg/dL — AB (ref 65–99)
GLUCOSE-CAPILLARY: 217 mg/dL — AB (ref 65–99)

## 2016-12-18 LAB — HEMOGLOBIN A1C
Hgb A1c MFr Bld: 5.5 % (ref 4.8–5.6)
Mean Plasma Glucose: 111 mg/dL

## 2016-12-18 MED ORDER — METHYLPREDNISOLONE SODIUM SUCC 125 MG IJ SOLR
60.0000 mg | Freq: Four times a day (QID) | INTRAMUSCULAR | Status: DC
  Administered 2016-12-18: 62.5 mg via INTRAVENOUS
  Administered 2016-12-19 – 2016-12-20 (×7): 60 mg via INTRAVENOUS
  Filled 2016-12-18 (×8): qty 2

## 2016-12-18 MED ORDER — ENOXAPARIN SODIUM 40 MG/0.4ML ~~LOC~~ SOLN
40.0000 mg | Freq: Every day | SUBCUTANEOUS | Status: DC
  Administered 2016-12-18 – 2016-12-20 (×3): 40 mg via SUBCUTANEOUS
  Filled 2016-12-18 (×3): qty 0.4

## 2016-12-18 MED ORDER — IPRATROPIUM-ALBUTEROL 0.5-2.5 (3) MG/3ML IN SOLN
3.0000 mL | RESPIRATORY_TRACT | Status: DC
  Administered 2016-12-18 – 2016-12-21 (×15): 3 mL via RESPIRATORY_TRACT
  Filled 2016-12-18 (×16): qty 3

## 2016-12-18 MED ORDER — FUROSEMIDE 10 MG/ML IJ SOLN
40.0000 mg | Freq: Once | INTRAMUSCULAR | Status: AC
  Administered 2016-12-18: 40 mg via INTRAVENOUS
  Filled 2016-12-18: qty 4

## 2016-12-18 NOTE — Plan of Care (Signed)
All care plans progressing 

## 2016-12-18 NOTE — Progress Notes (Signed)
PROGRESS NOTE  Sheila Pierce XLK:[id number] DOB: 01/04/1946 DOA: 12/16/2016 PCP: Eustace MooreNelson, [name], MD  Brief History:  71 year old female with a history of COPD/emphysema, previous tobacco abuse, anxiety/depression, chronic diastolic CHF, CKD stage III, diabetes mellitus, hyperlipidemia, essential hypertension, and history of non-STEMI with nonobstructive CAD on cath in 2014 who is brought to the emergency department via EMS due to progressively worse shortness of breathassociated with wheezing andnonproductive cough that began in the afternoon of December 16, 2016.  The patient quit smoking over 5 years ago.  She denies any fevers, chills, chest pain, hemoptysis, nausea, vomiting, diarrhea, abdominal pain.  The patient was [facility] from November 13, 2016 through November 16, 2016 for COPD exacerbation.  She was discharged with a prednisone taper.  In addition, she also had admission from October 31, 2015 through November 03, 2015 for COPD exacerbation for which she required BiPAP for short period of time.  Upon presentation, the patient was placed on BiPAP and was given Solu-Medrol 125 mg IV x1 and furosemide 40 mg IV x1.  The patient was subsequently weaned off of BiPAP on the morning of December 17, 2016.    Assessment/Plan: Acute on chronic respiratory failure with hypoxia and hypercarbia -Secondary to COPD exacerbation -Weaned off of BiPAP December 17, 2016 -Wean back to baseline 3 L for oxygen saturation greater than 92% -11/24 CXR personally reviewed--mild vascular congestion -lasix 40 mg IV x 1  COPD exacerbation -Continue Pulmicort nebulizers -Continue duo nebs -Check respiratory viral panel--neg -Continue IV Solu-Medrol--increase to 60 mg IV q 6hours -very little functional reserve--significant dyspnea/desaturation [facility] -morphine solution prn refractory dyspnea  CKD stage III -Baseline creatinine 1.2-1.4 -A.m. BMP  Essential  hypertension  -holding HCTZ, metoprolol succinate, lisinopril secondary to hypotension/soft BPs  Diabetes mellitus type 2, controlled -November 11, 2016 hemoglobin A1c 5.2 -Elevated CBG secondary to steroids -NovoLog sliding scale while on steroids  Coronary artery disease with history NSTEMI -No chest pain presently -personally reviewed EKG--sinus rhythm, nonspecific T wave changes  Lower extremity pain -Venous duplex--negative for DVT  Anxiety/depression -Continue home dose of BuSpar -Patient has a history of situational anxiety  GERD -Continue PPI    Disposition Plan:   Home in 2-3 days  Family Communication:   No Family at bedside  Consultants:  none  Code Status:  DNR  DVT Prophylaxis:  [place] Lovenox   Procedures: As Listed in Progress Note Above  Antibiotics: None     Subjective: Overall, patient is breathing better.  However she has very little reserve.  She has significant dyspnea with minimal exertion.  Denies any fevers, chills, chest pain, nausea, vomiting, diarrhea, abdominal pain.  She has a nonproductive cough.  Denies any headaches or neck pain.  Objective: Vitals:   12/18/16 1100 12/18/16 1200 12/18/16 1517 12/18/16 1618  BP: (!) 142/56 (!) 102/50    Pulse: 99 97  97  Resp: 16 14  20   Temp:    97.6 F (36.4 C)  TempSrc:    Oral  SpO2: 95% 98% 94% 99%  Weight:      Height:        Intake/Output Summary (Last 24 hours) at 12/18/2016 1640 Last data filed at 12/18/2016 1207 Gross per 24 hour  Intake 1200 ml  Output 400 ml  Net 800 ml   Weight change: -0.125 kg (-4.4 oz) Exam:   General:  Pt is alert, follows commands appropriately, not in acute  distress  HEENT: No icterus, No thrush, No neck mass, [place]/AT  Cardiovascular: RRR, S1/S2, no rubs, no gallops  Respiratory: Bilateral scattered rales.  Diminished breath sounds bilateral.  Expiratory wheeze at the bases.  Abdomen: Soft/+BS, non tender, non distended, no  guarding  Extremities: trace LE edema, No lymphangitis, No petechiae, No rashes, no synovitis   Data Reviewed: I have personally reviewed following labs and imaging studies Basic Metabolic Panel: Recent Labs  Lab 12/16/16 2153 12/17/16 0553 12/18/16 0535  NA 135 134* 134*  K 3.9 3.7 4.6  CL 95* 95* 96*  CO2 35* 28 30  GLUCOSE 159* 185* 145*  BUN 23* 25* 31*  CREATININE 1.46* 1.44* 1.28*  CALCIUM 10.3 9.7 9.8   Liver Function Tests: No results for input(s): AST, ALT, ALKPHOS, BILITOT, PROT, ALBUMIN in the last 168 hours. No results for input(s): LIPASE, AMYLASE in the last 168 hours. No results for input(s): AMMONIA in the last 168 hours. Coagulation Profile: No results for input(s): INR, PROTIME in the last 168 hours. CBC: Recent Labs  Lab 12/16/16 2153  WBC 8.0  HGB 11.1*  HCT 38.5  MCV 96.3  PLT 264   Cardiac Enzymes: No results for input(s): CKTOTAL, CKMB, CKMBINDEX, TROPONINI in the last 168 hours. BNP: Invalid input(s): POCBNP CBG: Recent Labs  Lab 12/17/16 1729 12/17/16 2109 12/18/16 0716 12/18/16 1055 12/18/16 1617  GLUCAP 117* 153* 128* 140* 137*   HbA1C: Recent Labs    12/17/16 0553  HGBA1C 5.5   Urine analysis:    Component Value Date/Time   COLORURINE YELLOW 04/16/2016 1920   APPEARANCEUR HAZY (A) 04/16/2016 1920   LABSPEC 1.006 04/16/2016 1920   PHURINE 6.0 04/16/2016 1920   GLUCOSEU NEGATIVE 04/16/2016 1920   HGBUR SMALL (A) 04/16/2016 1920   BILIRUBINUR neg 11/24/2016 1131   KETONESUR NEGATIVE 04/16/2016 1920   PROTEINUR neg 11/24/2016 1131   PROTEINUR NEGATIVE 04/16/2016 1920   UROBILINOGEN 0.2 11/24/2016 1131   NITRITE neg 11/24/2016 1131   NITRITE POSITIVE (A) 04/16/2016 1920   LEUKOCYTESUR Small (1+) (A) 11/24/2016 1131   Sepsis Labs: @LABRCNTIP (procalcitonin:4,lacticidven:4) ) Recent Results (from the past 240 hour(s))  MRSA PCR Screening     Status: None   Collection Time: 12/17/16  3:28 AM  Result Value Ref Range  Status   MRSA by PCR NEGATIVE NEGATIVE Final    Comment:        The GeneXpert MRSA Assay (FDA approved for NASAL specimens only), is one component of a comprehensive MRSA colonization surveillance program. It is not intended to diagnose MRSA infection nor to guide or monitor treatment for MRSA infections.   Respiratory Panel by PCR     Status: None   Collection Time: 12/17/16  8:26 AM  Result Value Ref Range Status   Adenovirus NOT DETECTED NOT DETECTED Final   Coronavirus 229E NOT DETECTED NOT DETECTED Final   Coronavirus HKU1 NOT DETECTED NOT DETECTED Final   Coronavirus NL63 NOT DETECTED NOT DETECTED Final   Coronavirus OC43 NOT DETECTED NOT DETECTED Final   Metapneumovirus NOT DETECTED NOT DETECTED Final   Rhinovirus / Enterovirus NOT DETECTED NOT DETECTED Final   Influenza A NOT DETECTED NOT DETECTED Final   Influenza B NOT DETECTED NOT DETECTED Final   Parainfluenza Virus 1 NOT DETECTED NOT DETECTED Final   Parainfluenza Virus 2 NOT DETECTED NOT DETECTED Final   Parainfluenza Virus 3 NOT DETECTED NOT DETECTED Final   Parainfluenza Virus 4 NOT DETECTED NOT DETECTED Final   Respiratory Syncytial Virus  NOT DETECTED NOT DETECTED Final   Bordetella pertussis NOT DETECTED NOT DETECTED Final   Chlamydophila pneumoniae NOT DETECTED NOT DETECTED Final   Mycoplasma pneumoniae NOT DETECTED NOT DETECTED Final    Comment: Performed at [facility], [address] [place], [place] [address]     Scheduled Meds: . aspirin EC  81 mg Oral Daily  . budesonide (PULMICORT) nebulizer solution  0.5 mg Nebulization BID  . busPIRone  15 mg Oral BID  . cholecalciferol  1,000 Units Oral Daily  . enoxaparin (LOVENOX) injection  40 mg Subcutaneous QHS  . insulin aspart  0-9 Units Subcutaneous TID WC  . ipratropium-albuterol  3 mL Nebulization Q6H  . methylPREDNISolone (SOLU-MEDROL) injection  60 mg Intravenous Q6H  . metoprolol tartrate  12.5 mg Oral BID  . multivitamin with minerals  1  tablet Oral Daily  . pantoprazole  40 mg Oral Daily  . potassium chloride  10 mEq Oral Daily   Continuous Infusions:  Procedures/Studies: US Venous Img Lower Bilateral  Result Date: 12/18/2016 CLINICAL DATA:  71 year old female with a history of bilateral lower extremity pain EXAM: BILATERAL LOWER EXTREMITY VENOUS DOPPLER ULTRASOUND TECHNIQUE: Gray-scale sonography with graded compression, as well as color Doppler and duplex ultrasound were performed to evaluate the lower extremity deep venous systems from the level of the common femoral vein and including the common femoral, femoral, profunda femoral, popliteal and calf veins including the posterior tibial, peroneal and gastrocnemius veins when visible. The superficial great saphenous vein was also interrogated. Spectral Doppler was utilized to evaluate flow at rest and with distal augmentation maneuvers in the common femoral, femoral and popliteal veins. COMPARISON:  None. FINDINGS: RIGHT LOWER EXTREMITY Common Femoral Vein: No evidence of thrombus. Normal compressibility, respiratory phasicity and response to augmentation. Saphenofemoral Junction: No evidence of thrombus. Normal compressibility and flow on color Doppler imaging. Profunda Femoral Vein: No evidence of thrombus. Normal compressibility and flow on color Doppler imaging. Femoral Vein: No evidence of thrombus. Normal compressibility, respiratory phasicity and response to augmentation. Popliteal Vein: No evidence of thrombus. Normal compressibility, respiratory phasicity and response to augmentation. Calf Veins: No evidence of thrombus. Normal compressibility and flow on color Doppler imaging. Superficial Great Saphenous Vein: No evidence of thrombus. Normal compressibility and flow on color Doppler imaging. Other Findings:  None. LEFT LOWER EXTREMITY Common Femoral Vein: No evidence of thrombus. Normal compressibility, respiratory phasicity and response to augmentation. Saphenofemoral  Junction: No evidence of thrombus. Normal compressibility and flow on color Doppler imaging. Profunda Femoral Vein: No evidence of thrombus. Normal compressibility and flow on color Doppler imaging. Femoral Vein: No evidence of thrombus. Normal compressibility, respiratory phasicity and response to augmentation. Popliteal Vein: No evidence of thrombus. Normal compressibility, respiratory phasicity and response to augmentation. Calf Veins: No evidence of thrombus. Normal compressibility and flow on color Doppler imaging. Superficial Great Saphenous Vein: No evidence of thrombus. Normal compressibility and flow on color Doppler imaging. Other Findings:  None. IMPRESSION: Sonographic survey of the bilateral lower extremities negative for DVT. Signed, Yvone Neu. Loreta Ave, DO [facility] Electronically Signed   By: Gilmer Mor D.O.   On: 12/18/2016 08:45   Dg Chest Port 1 View  Result Date: 12/18/2016 CLINICAL DATA:  Respiratory failure. Shortness of breath. Pulmonary edema. EXAM: PORTABLE CHEST 1 VIEW COMPARISON:  12/16/2016 FINDINGS: The cardiomediastinal silhouette is unchanged. Heart size is within normal limits. Aortic atherosclerosis is noted. Pulmonary vascular congestion is similar to the prior study. Mild bibasilar  opacities have mildly improved. There is a persistent small left pleural effusion. No pneumothorax is identified. IMPRESSION: Unchanged pulmonary vascular congestion and small left pleural effusion. Mildly improved aeration of the lung bases. Electronically Signed   By: Sebastian Ache M.D.   On: 12/18/2016 08:19   Dg Chest Port 1 View  Result Date: 12/16/2016 CLINICAL DATA:  Shortness of breath tonight. EXAM: PORTABLE CHEST 1 VIEW COMPARISON:  Radiograph 11/09/2016 FINDINGS: Unchanged heart size and mediastinal contours. Aortic arch atherosclerosis. Increased vascular congestion. Mild peribronchial cuffing may be pulmonary edema. Small  bilateral pleural effusions. Streaky bibasilar atelectasis. No pneumothorax. IMPRESSION: Mild CHF with vascular congestion, small pleural effusions and mild peribronchial thickening suspicious for pulmonary edema. Electronically Signed   By: Rubye Oaks M.D.   On: 12/16/2016 22:22    Wanna Gully, DO  [facility] Pager [phone]  If 7PM-7AM, please contact night-coverage [email or web address] Password [email or web address] 12/18/2016, 4:40 PM   LOS: 2 days

## 2016-12-19 DIAGNOSIS — R0609 Other forms of dyspnea: Secondary | ICD-10-CM

## 2016-12-19 DIAGNOSIS — R0902 Hypoxemia: Secondary | ICD-10-CM

## 2016-12-19 DIAGNOSIS — J9620 Acute and chronic respiratory failure, unspecified whether with hypoxia or hypercapnia: Secondary | ICD-10-CM

## 2016-12-19 DIAGNOSIS — R0989 Other specified symptoms and signs involving the circulatory and respiratory systems: Secondary | ICD-10-CM

## 2016-12-19 DIAGNOSIS — E1129 Type 2 diabetes mellitus with other diabetic kidney complication: Secondary | ICD-10-CM

## 2016-12-19 DIAGNOSIS — I5033 Acute on chronic diastolic (congestive) heart failure: Secondary | ICD-10-CM

## 2016-12-19 LAB — BASIC METABOLIC PANEL
Anion gap: 9 (ref 5–15)
BUN: 42 mg/dL — AB (ref 6–20)
CALCIUM: 9.8 mg/dL (ref 8.9–10.3)
CO2: 33 mmol/L — ABNORMAL HIGH (ref 22–32)
CREATININE: 1.41 mg/dL — AB (ref 0.44–1.00)
Chloride: 95 mmol/L — ABNORMAL LOW (ref 101–111)
GFR calc Af Amer: 42 mL/min — ABNORMAL LOW (ref >60)
GFR, EST NON AFRICAN AMERICAN: 36 mL/min — AB (ref >60)
GLUCOSE: 164 mg/dL — AB (ref 65–99)
Potassium: 4 mmol/L (ref 3.5–5.1)
SODIUM: 137 mmol/L (ref 135–145)

## 2016-12-19 LAB — GLUCOSE, CAPILLARY
GLUCOSE-CAPILLARY: 141 mg/dL — AB (ref 65–99)
GLUCOSE-CAPILLARY: 133 mg/dL — AB (ref 65–99)
GLUCOSE-CAPILLARY: 155 mg/dL — AB (ref 65–99)
Glucose-Capillary: 171 mg/dL — ABNORMAL HIGH (ref 65–99)

## 2016-12-19 LAB — MAGNESIUM: MAGNESIUM: 2.2 mg/dL (ref 1.7–2.4)

## 2016-12-19 MED ORDER — FUROSEMIDE 20 MG PO TABS
20.0000 mg | ORAL_TABLET | Freq: Every day | ORAL | Status: DC
  Administered 2016-12-19 – 2016-12-21 (×3): 20 mg via ORAL
  Filled 2016-12-19 (×3): qty 1

## 2016-12-19 MED ORDER — ARFORMOTEROL TARTRATE 15 MCG/2ML IN NEBU
15.0000 ug | INHALATION_SOLUTION | Freq: Two times a day (BID) | RESPIRATORY_TRACT | Status: DC
  Administered 2016-12-19 – 2016-12-21 (×4): 15 ug via RESPIRATORY_TRACT
  Filled 2016-12-19 (×4): qty 2

## 2016-12-19 NOTE — Progress Notes (Addendum)
PROGRESS NOTE  Sheila Pierce EBX:[id number] DOB: 08/05/1945 DOA: 12/16/2016 PCP: [name], MD  Brief History: 71 year old female with a history of COPD/emphysema, previous tobacco abuse, anxiety/depression, chronic diastolic CHF, CKD stage III, diabetes mellitus, hyperlipidemia, essential hypertension, andhistory of non-STEMI with nonobstructive CAD on cath in 2014 who is brought to the emergency department via EMS due to progressively worse shortness of breathassociated with wheezing andnonproductivecoughthat began in the afternoon of December 16, 2016. The patient quit smoking over 5 years ago. She denies any fevers, chills, chest pain, hemoptysis, nausea, vomiting, diarrhea, abdominal pain. The patient was recently admitted to [facility] November 13, 2016 through November 16, 2016 for COPD exacerbation. She was discharged with a prednisone taper. In addition, she also had admission from October 31, 2015 through November 03, 2015 for COPD exacerbation for which she required BiPAP for short period of time. Upon presentation, the patient was placed on BiPAP and was given Solu-Medrol 125 mg IV x1 and furosemide 40 mg IV x1. The patient was subsequently weaned off of BiPAP on the morning of December 17, 2016.    Assessment/Plan: Acute on chronic respiratory failure with hypoxia and hypercarbia -Secondary to COPD exacerbation and CHF (COPD is major contributor) -Weaned off of BiPAP December 17, 2016 -Wean back to baseline 3 L for oxygen saturation greater than 92% -11/24 CXR personally reviewed--mild vascular congestion  COPD exacerbation -Continue Pulmicort nebulizers -Continue duo nebs -Checkrespiratory viral panel--neg -Continue IV Solu-Medrol-- 60 mg IV q 6hours -very little functional reserve--significant dyspnea/desaturation just to [facility] -morphine solution prn refractory dyspnea -add Brovana  Acute on chronic diastolic CHF -personally reviewed  CXR--vascular congestion -received lasix 40 mg IV x 2 during admission with clinical improvement and improved Na -Echo--EF 65-70%, grade 1 DD, no WMA, mild TR -start lasix 20 mg daily  CKD stage III -Baseline creatinine 1.2-1.4 -A.m. BMP  Essential hypertension -holding HCTZ, metoprolol succinate, lisinopril secondary to hypotension/soft BPs  Diabetes mellitus type 2, controlled -November 11, 2016 hemoglobin A1c 5.2 -Elevated CBG secondary to steroids -NovoLog sliding scale while on steroids  Coronary artery disease with historyNSTEMI -No chest pain presently -personally reviewed EKG--sinus rhythm, nonspecific T wave changes  Lower extremity pain -Venous duplex--negative for DVT  Anxiety/depression -Continue home dose of BuSpar -Patient has a history of situational anxiety  GERD -Continue PPI    Disposition Plan: Home 11/26 or 11/27 if stable Family Communication:NoFamily at bedside  Consultants:none  Code Status: DNR  DVT Prophylaxis: [place] Lovenox   Procedures: As Listed in Progress Note Above  Antibiotics: None       Subjective: Overall, patient is breathing better.  However she remains short of breath even with minimal exertion.  Denies any nausea, vomiting, diarrhea, abdominal pain, dysuria, hematuria.  She has a nonproductive cough.  Denies any headaches or neck pain.  Objective: Vitals:   12/19/16 0734 12/19/16 0746 12/19/16 0800 12/19/16 1131  BP:   (!) 135/113   Pulse: 90  (!) 105   Resp: 17  13   Temp: 97.8 F (36.6 C)     TempSrc: Oral     SpO2: 100% 100% 97% 98%  Weight:      Height:        Intake/Output Summary (Last 24 hours) at 12/19/2016 1312 Last data filed at 12/19/2016 0800 Gross per 24 hour  Intake 960 ml  Output 800 ml  Net 160 ml   Weight change: 0.1 kg (3.5 oz) Exam:  General:  Pt is alert, follows commands appropriately, not in acute distress  HEENT: No icterus, No thrush, No neck  mass, [place]/AT  Cardiovascular: RRR, S1/S2, no rubs, no gallops  Respiratory: Bibasilar crackles.  No wheezing.  Good air movement.  Abdomen: Soft/+BS, non tender, non distended, no guarding  Extremities: trace LE edema, No lymphangitis, No petechiae, No rashes, no synovitis   Data Reviewed: I have personally reviewed following labs and imaging studies Basic Metabolic Panel: Recent Labs  Lab 12/16/16 2153 12/17/16 0553 12/18/16 0535 12/19/16 0447  NA 135 134* 134* 137  K 3.9 3.7 4.6 4.0  CL 95* 95* 96* 95*  CO2 35* 28 30 33*  GLUCOSE 159* 185* 145* 164*  BUN 23* 25* 31* 42*  CREATININE 1.46* 1.44* 1.28* 1.41*  CALCIUM 10.3 9.7 9.8 9.8  MG  --   --   --  2.2   Liver Function Tests: No results for input(s): AST, ALT, ALKPHOS, BILITOT, PROT, ALBUMIN in the last 168 hours. No results for input(s): LIPASE, AMYLASE in the last 168 hours. No results for input(s): AMMONIA in the last 168 hours. Coagulation Profile: No results for input(s): INR, PROTIME in the last 168 hours. CBC: Recent Labs  Lab 12/16/16 2153  WBC 8.0  HGB 11.1*  HCT 38.5  MCV 96.3  PLT 264   Cardiac Enzymes: No results for input(s): CKTOTAL, CKMB, CKMBINDEX, TROPONINI in the last 168 hours. BNP: Invalid input(s): POCBNP CBG: Recent Labs  Lab 12/18/16 1055 12/18/16 1617 12/18/16 2059 12/19/16 0733 12/19/16 1142  GLUCAP 140* 137* 217* 171* 141*   HbA1C: Recent Labs    12/17/16 0553  HGBA1C 5.5   Urine analysis:    Component Value Date/Time   COLORURINE YELLOW 04/16/2016 1920   APPEARANCEUR HAZY (A) 04/16/2016 1920   LABSPEC 1.006 04/16/2016 1920   PHURINE 6.0 04/16/2016 1920   GLUCOSEU NEGATIVE 04/16/2016 1920   HGBUR SMALL (A) 04/16/2016 1920   BILIRUBINUR neg 11/24/2016 1131   KETONESUR NEGATIVE 04/16/2016 1920   PROTEINUR neg 11/24/2016 1131   PROTEINUR NEGATIVE 04/16/2016 1920   UROBILINOGEN 0.2 11/24/2016 1131   NITRITE neg 11/24/2016 1131   NITRITE POSITIVE (A) 04/16/2016  1920   LEUKOCYTESUR Small (1+) (A) 11/24/2016 1131   Sepsis Labs: @LABRCNTIP (procalcitonin:4,lacticidven:4) ) Recent Results (from the past 240 hour(s))  MRSA PCR Screening     Status: None   Collection Time: 12/17/16  3:28 AM  Result Value Ref Range Status   MRSA by PCR NEGATIVE NEGATIVE Final    Comment:        The GeneXpert MRSA Assay (FDA approved for NASAL specimens only), is one component of a comprehensive MRSA colonization surveillance program. It is not intended to diagnose MRSA infection nor to guide or monitor treatment for MRSA infections.   Respiratory Panel by PCR     Status: None   Collection Time: 12/17/16  8:26 AM  Result Value Ref Range Status   Adenovirus NOT DETECTED NOT DETECTED Final   Coronavirus 229E NOT DETECTED NOT DETECTED Final   Coronavirus HKU1 NOT DETECTED NOT DETECTED Final   Coronavirus NL63 NOT DETECTED NOT DETECTED Final   Coronavirus OC43 NOT DETECTED NOT DETECTED Final   Metapneumovirus NOT DETECTED NOT DETECTED Final   Rhinovirus / Enterovirus NOT DETECTED NOT DETECTED Final   Influenza A NOT DETECTED NOT DETECTED Final   Influenza B NOT DETECTED NOT DETECTED Final   Parainfluenza Virus 1 NOT DETECTED NOT DETECTED Final   Parainfluenza Virus 2 NOT DETECTED  NOT DETECTED Final   Parainfluenza Virus 3 NOT DETECTED NOT DETECTED Final   Parainfluenza Virus 4 NOT DETECTED NOT DETECTED Final   Respiratory Syncytial Virus NOT DETECTED NOT DETECTED Final   Bordetella pertussis NOT DETECTED NOT DETECTED Final   Chlamydophila pneumoniae NOT DETECTED NOT DETECTED Final   Mycoplasma pneumoniae NOT DETECTED NOT DETECTED Final    Comment: Performed at [facility], [address] [place], [place] [address]     Scheduled Meds: . aspirin EC  81 mg Oral Daily  . budesonide (PULMICORT) nebulizer solution  0.5 mg Nebulization BID  . busPIRone  15 mg Oral BID  . cholecalciferol  1,000 Units Oral Daily  . enoxaparin (LOVENOX) injection  40 mg  Subcutaneous QHS  . insulin aspart  0-9 Units Subcutaneous TID WC  . ipratropium-albuterol  3 mL Nebulization Q4H  . methylPREDNISolone (SOLU-MEDROL) injection  60 mg Intravenous Q6H  . metoprolol tartrate  12.5 mg Oral BID  . multivitamin with minerals  1 tablet Oral Daily  . pantoprazole  40 mg Oral Daily   Continuous Infusions:  Procedures/Studies: [place] Venous Img Lower Bilateral  Result Date: 12/18/2016 CLINICAL DATA:  71 year old female with a history of bilateral lower extremity pain EXAM: BILATERAL LOWER EXTREMITY VENOUS DOPPLER ULTRASOUND TECHNIQUE: Gray-scale sonography with graded compression, as well as color Doppler and duplex ultrasound were performed to evaluate the lower extremity deep venous systems from the level of the common femoral vein and including the common femoral, femoral, profunda femoral, popliteal and calf veins including the posterior tibial, peroneal and gastrocnemius veins when visible. The superficial great saphenous vein was also interrogated. Spectral Doppler was utilized to evaluate flow at rest and with distal augmentation maneuvers in the common femoral, femoral and popliteal veins. COMPARISON:  None. FINDINGS: RIGHT LOWER EXTREMITY Common Femoral Vein: No evidence of thrombus. Normal compressibility, respiratory phasicity and response to augmentation. Saphenofemoral Junction: No evidence of thrombus. Normal compressibility and flow on color Doppler imaging. Profunda Femoral Vein: No evidence of thrombus. Normal compressibility and flow on color Doppler imaging. Femoral Vein: No evidence of thrombus. Normal compressibility, respiratory phasicity and response to augmentation. Popliteal Vein: No evidence of thrombus. Normal compressibility, respiratory phasicity and response to augmentation. Calf Veins: No evidence of thrombus. Normal compressibility and flow on color Doppler imaging. Superficial Great Saphenous Vein: No evidence of thrombus. Normal compressibility and  flow on color Doppler imaging. Other Findings:  None. LEFT LOWER EXTREMITY Common Femoral Vein: No evidence of thrombus. Normal compressibility, respiratory phasicity and response to augmentation. Saphenofemoral Junction: No evidence of thrombus. Normal compressibility and flow on color Doppler imaging. Profunda Femoral Vein: No evidence of thrombus. Normal compressibility and flow on color Doppler imaging. Femoral Vein: No evidence of thrombus. Normal compressibility, respiratory phasicity and response to augmentation. Popliteal Vein: No evidence of thrombus. Normal compressibility, respiratory phasicity and response to augmentation. Calf Veins: No evidence of thrombus. Normal compressibility and flow on color Doppler imaging. Superficial Great Saphenous Vein: No evidence of thrombus. Normal compressibility and flow on color Doppler imaging. Other Findings:  None. IMPRESSION: Sonographic survey of the bilateral lower extremities negative for DVT. Signed, Yvone NeuJaime S. Loreta AveWagner, DO [facility] Electronically Signed   By: Gilmer MorJaime  Wagner D.O.   On: 12/18/2016 08:45   Dg Chest Port 1 View  Result Date: 12/18/2016 CLINICAL DATA:  Respiratory failure. Shortness of breath. Pulmonary edema. EXAM: PORTABLE CHEST 1 VIEW COMPARISON:  12/16/2016 FINDINGS: The cardiomediastinal silhouette is unchanged. Heart size  is within normal limits. Aortic atherosclerosis is noted. Pulmonary vascular congestion is similar to the prior study. Mild bibasilar opacities have mildly improved. There is a persistent small left pleural effusion. No pneumothorax is identified. IMPRESSION: Unchanged pulmonary vascular congestion and small left pleural effusion. Mildly improved aeration of the lung bases. Electronically Signed   By: Sebastian Ache M.D.   On: 12/18/2016 08:19   Dg Chest Port 1 View  Result Date: 12/16/2016 CLINICAL DATA:  Shortness of breath tonight. EXAM: PORTABLE CHEST 1  VIEW COMPARISON:  Radiograph 11/09/2016 FINDINGS: Unchanged heart size and mediastinal contours. Aortic arch atherosclerosis. Increased vascular congestion. Mild peribronchial cuffing may be pulmonary edema. Small bilateral pleural effusions. Streaky bibasilar atelectasis. No pneumothorax. IMPRESSION: Mild CHF with vascular congestion, small pleural effusions and mild peribronchial thickening suspicious for pulmonary edema. Electronically Signed   By: Rubye Oaks M.D.   On: 12/16/2016 22:22    Huong Luthi, DO  [facility] Pager [phone]  If 7PM-7AM, please contact night-coverage [email or web address] Password [email or web address] 12/19/2016, 1:12 PM   LOS: 3 days

## 2016-12-19 NOTE — Plan of Care (Signed)
All care plans progressing 

## 2016-12-20 LAB — BASIC METABOLIC PANEL
Anion gap: 11 (ref 5–15)
BUN: 49 mg/dL — AB (ref 6–20)
CALCIUM: 9.2 mg/dL (ref 8.9–10.3)
CO2: 31 mmol/L (ref 22–32)
CREATININE: 1.37 mg/dL — AB (ref 0.44–1.00)
Chloride: 93 mmol/L — ABNORMAL LOW (ref 101–111)
GFR calc non Af Amer: 38 mL/min — ABNORMAL LOW (ref >60)
GFR, EST AFRICAN AMERICAN: 43 mL/min — AB (ref >60)
Glucose, Bld: 207 mg/dL — ABNORMAL HIGH (ref 65–99)
Potassium: 3.6 mmol/L (ref 3.5–5.1)
SODIUM: 135 mmol/L (ref 135–145)

## 2016-12-20 LAB — GLUCOSE, CAPILLARY
GLUCOSE-CAPILLARY: 165 mg/dL — AB (ref 65–99)
GLUCOSE-CAPILLARY: 144 mg/dL — AB (ref 65–99)
Glucose-Capillary: 158 mg/dL — ABNORMAL HIGH (ref 65–99)
Glucose-Capillary: 188 mg/dL — ABNORMAL HIGH (ref 65–99)

## 2016-12-20 MED ORDER — PREDNISONE 20 MG PO TABS
60.0000 mg | ORAL_TABLET | Freq: Every day | ORAL | Status: DC
  Administered 2016-12-21: 60 mg via ORAL
  Filled 2016-12-20: qty 3

## 2016-12-20 MED ORDER — METOPROLOL TARTRATE 25 MG PO TABS
25.0000 mg | ORAL_TABLET | Freq: Two times a day (BID) | ORAL | Status: DC
  Administered 2016-12-20 – 2016-12-21 (×3): 25 mg via ORAL
  Filled 2016-12-20 (×3): qty 1

## 2016-12-20 NOTE — Plan of Care (Signed)
  Acute Rehab PT Goals(only PT should resolve) Pt Will Go Supine/Side To Sit 12/20/2016 1509 - Progressing by Ocie Bob, PT Flowsheets Taken 12/20/2016 1509  Pt will go Supine/Side to Sit Independently Patient Will Transfer Sit To/From Stand 12/20/2016 1509 - Progressing by Ocie Bob, PT Flowsheets Taken 12/20/2016 1509  Patient will transfer sit to/from stand with modified independence Pt Will Transfer Bed To Chair/Chair To Bed 12/20/2016 1509 - Progressing by Ocie Bob, PT Flowsheets Taken 12/20/2016 1509  Pt will Transfer Bed to Chair/Chair to Bed with supervision Pt Will Ambulate 12/20/2016 1509 - Progressing by Ocie Bob, PT Flowsheets Taken 12/20/2016 1509  Pt will Ambulate 25 feet;with supervision;with rolling walker  3:11 PM, 12/20/16 Ocie Bob, MPT Physical Therapist with [facility] [phone] office [phone] mobile phone

## 2016-12-20 NOTE — Evaluation (Signed)
Physical Therapy Evaluation Patient Details Name: Sheila Pierce MRN: [id number] DOB: 10-Dec-1945 Today's Date: 12/20/2016   History of Present Illness  Sheila Pierce is a 71 y.o. female with medical history significant of allergies/asthma, COPD/emphysema, previous tobacco abuse, anemia, anxiety, depression, osteoarthritis, cataracts, macular degeneration, chronic diastolic CHF, chronic kidney disease, type 2 diabetes, GERD, hypercholesterolemia, hypertension, trigeminal neuralgia, history of non-STEMI with nonobstructive CAD on cath in 2014 who is brought to the emergency department via EMS due to progressively worse shortness of breath associated with wheezing and dry cough since earlier today.    Clinical Impression  Patient mostly limited for out of bed and gait due to severe SOB, other than that patient is functioning near baseline.  Patient will benefit from continued physical therapy in hospital and recommended venue below to increase strength, balance, endurance for safe ADLs and gait.  Patient expresses she does not want home health physical therapy.    Follow Up Recommendations No PT follow up;Supervision for mobility/OOB    Equipment Recommendations  None recommended by PT    Recommendations for Other Services       Precautions / Restrictions Precautions Precautions: Fall Restrictions Weight Bearing Restrictions: No      Mobility  Bed Mobility Overal bed mobility: Needs Assistance Bed Mobility: Supine to Sit;Sit to Supine     Supine to sit: Supervision Sit to supine: Supervision      Transfers Overall transfer level: Needs assistance Equipment used: None;1 person hand held assist Transfers: Sit to/from Stand Sit to Stand: Min guard            Ambulation/Gait Ambulation/Gait assistance: Min assist Ambulation Distance (Feet): 3 Feet Assistive device: None     Gait velocity interpretation: Below normal speed for age/gender General Gait Details: limited  to 5-6 steps at bedside, very unsteady with leaning on nearby objects for support, limited secondary to SOB  Stairs            Wheelchair Mobility    Modified Rankin (Stroke Patients Only)       Balance Overall balance assessment: Needs assistance Sitting-balance support: No upper extremity supported;Feet unsupported Sitting balance-Leahy Scale: Good     Standing balance support: No upper extremity supported;During functional activity Standing balance-Leahy Scale: Fair                               Pertinent Vitals/Pain Pain Assessment: No/denies pain    Home Living Family/patient expects to be discharged to:: Private residence Living Arrangements: Children Available Help at Discharge: Family Type of Home: House Home Access: Stairs to enter Entrance Stairs-Rails: None Secretary/administrator of Steps: 1 Home Layout: One level Home Equipment: Wheelchair - Proofreader - standard      Prior Function Level of Independence: Needs assistance   Gait / Transfers Assistance Needed: leans on furniture/walls for household distances, uses wheelchair for longer distances  ADL's / Homemaking Assistance Needed: Pt has aide 3 hours/day, 7 days/week who assists with B/ADL completion; sons available at other times        Hand Dominance        Extremity/Trunk Assessment   Upper Extremity Assessment Upper Extremity Assessment: Generalized weakness    Lower Extremity Assessment Lower Extremity Assessment: Generalized weakness    Cervical / Trunk Assessment Cervical / Trunk Assessment: Kyphotic  Communication   Communication: No difficulties  Cognition Arousal/Alertness: Awake/alert Behavior During Therapy: WFL for tasks assessed/performed Overall Cognitive Status: Within Functional  Limits for tasks assessed                                        General Comments      Exercises     Assessment/Plan    PT Assessment  Patient needs continued PT services  PT Problem List Decreased strength;Decreased activity tolerance;Decreased balance;Decreased mobility       PT Treatment Interventions Gait training;Functional mobility training;Therapeutic activities;Therapeutic exercise;Patient/family education    PT Goals (Current goals can be found in the Care Plan section)  Acute Rehab PT Goals Patient Stated Goal: return home with son to assist PT Goal Formulation: With patient Time For Goal Achievement: 12/27/16 Potential to Achieve Goals: Good    Frequency Min 3X/week   Barriers to discharge        Co-evaluation               AM-PAC PT "6 Clicks" Daily Activity  Outcome Measure Difficulty turning over in bed (including adjusting bedclothes, sheets and blankets)?: None Difficulty moving from lying on back to sitting on the side of the bed? : None Difficulty sitting down on and standing up from a chair with arms (e.g., wheelchair, bedside commode, etc,.)?: None Help needed moving to and from a bed to chair (including a wheelchair)?: A Little Help needed walking in hospital room?: A Little Help needed climbing 3-5 steps with a railing? : A Little 6 Click Score: 21    End of Session   Activity Tolerance: Patient limited by fatigue(Patient limited by SOB) Patient left: in bed;with call bell/phone within reach Nurse Communication: Mobility status PT Visit Diagnosis: Unsteadiness on feet (R26.81);Other abnormalities of gait and mobility (R26.89);Muscle weakness (generalized) (M62.81)    Time: [id number] PT Time Calculation (min) (ACUTE ONLY): 16 min   Charges:   PT Evaluation $PT Eval Moderate Complexity: 1 Mod PT Treatments $Therapeutic Activity: 8-22 mins   PT G Codes:        3:07 PM, 12/20/16 Ocie BobJames Mykelle Cockerell, MPT Physical Therapist with [facility] 336 [phone] office [phone] mobile phone

## 2016-12-20 NOTE — Progress Notes (Signed)
Pt refuse to wear BIPAP tonight "she don't feel like wearing it she wore it for the past three nights". RT will continue to monitor pt on 3lpm cann through out the night and giving treatments. RN notified

## 2016-12-20 NOTE — Progress Notes (Signed)
Dr. Sudie Bailey ?sp. Primary home care doctor called and was concerned about Ms. Sheila Pierce. Stated that they had called adult protective services due to conditions at patients home. Contacted case mgmt her and let them know.

## 2016-12-20 NOTE — Discharge Summary (Signed)
Physician Discharge Summary  Sheila Pierce ZOX:[id number] DOB: 14-Sep-1945 DOA: 12/16/2016  PCP: [name], MD  Admit date: 12/16/2016 Discharge date: 12/21/2016  Admitted From: Home Disposition:  Home   Recommendations for Outpatient Follow-up:  1. Follow up with PCP in 1-2 weeks 2. Please obtain BMP/CBC in one week   Home Health: patient refuses  Discharge Condition: Stable CODE STATUS: FULL Diet recommendation: Heart Healthy / Carb Modified / Dysphagia / Regular   Brief/Interim Summary: 71 year old female with a history of COPD/emphysema, previous tobacco abuse, anxiety/depression, chronic diastolic CHF, CKD stage III, diabetes mellitus, hyperlipidemia, essential hypertension, andhistory of non-STEMI with nonobstructive CAD on cath in 2014 who is brought to the emergency department via EMS due to progressively worse shortness of breathassociated with wheezing andnonproductivecoughthat began in the afternoon of December 16, 2016. The patient quit smoking over 5 years ago. She denies any fevers, chills, chest pain, hemoptysis, nausea, vomiting, diarrhea, abdominal pain. The patient was recently admitted to [facility] November 13, 2016 through November 16, 2016 for COPD exacerbation. She was discharged with a prednisone taper. In addition, she also had admission from October 31, 2015 through November 03, 2015 for COPD exacerbation for which she required BiPAP for short period of time. Upon presentation, the patient was placed on BiPAP and was given Solu-Medrol 125 mg IV x1 and furosemide 40 mg IV x1. The patient was subsequently weaned off of BiPAP on the morning of December 17, 2016.  Overall, the patient improved albeit slow.  Unfortunately, she continues to have very low functional reserve.     Discharge Diagnoses:  Acute on chronic respiratory failure with hypoxia and hypercarbia -Secondary to COPD exacerbationand CHF (COPD is major contributor) -Weaned off of  BiPAP December 17, 2016 -Weaned back to baseline 3 L for oxygen which is her home demand -11/24 CXR personally reviewed--mild vascular congestion  COPD exacerbation -ContinuePulmicort nebulizers -Continue duo nebs -Checkrespiratory viral panel--neg -Continue IV Solu-Medrol-- 60 mg IV q 6hours>>>transition to po prednisone -very little functional reserve--significant dyspnea/desaturation just to [facility] -morphine solution prn refractory dyspnea -added Brovana -home with prednisone taper over 2 weeks  Acute on chronic diastolic CHF -personally reviewed CXR--vascular congestion -received lasix 40 mg IV x 2 during admission with clinical improvement and improved Na -Echo--EF 65-70%, grade 1 DD, no WMA, mild TR -d/c home with lasix 20 mg daily -I/O not accurate  CKD stage III -Baseline creatinine 1.2-1.4 -A.m. BMP -will not restart lisinopril/HCTZ -serum creatinine 1.30 on day of d/c  Essential hypertension -holding HCTZ, metoprolol succinate, lisinopril secondary to hypotension/soft BPs -will not restart lisinopril due to CKD  Diabetes mellitus type 2, controlled -November 11, 2016 hemoglobin A1c 5.2 -Elevated CBG secondary to steroids -NovoLog sliding scale while on steroids  Coronary artery disease with historyNSTEMI -No chest pain presently -personally reviewed EKG--sinus rhythm, nonspecific T wave changes  Lower extremity pain -Venous duplex--negative for DVT  Anxiety/depression -Continue home dose of BuSpar -Patient has a history of situational anxiety  GERD -Continue PPI      Discharge Instructions  Discharge Instructions    Diet - low sodium heart healthy   Complete by:  As directed    Increase activity slowly   Complete by:  As directed      Allergies as of 12/21/2016      Reactions   Penicillins Swelling, Other (See Comments)   Reaction:  Unspecified swelling reaction Has patient had a PCN reaction causing immediate rash,  facial/tongue/throat swelling, SOB or lightheadedness with hypotension: Yes Has  patient had a PCN reaction causing severe rash involving mucus membranes or skin necrosis: No Has patient had a PCN reaction that required hospitalization No Has patient had a PCN reaction occurring within the last 10 years: Yes If all of the above answers are "NO", then may proceed with Cephalosporin use.   Sulfa Antibiotics Hives, Other (See Comments)   Reaction:  Hallucinations   Codeine Nausea Only   Hydrocodone Nausea Only   Levaquin [levofloxacin] Itching, Swelling, Rash      Medication List    STOP taking these medications   acetaZOLAMIDE 250 MG tablet Commonly known as:  DIAMOX   ciprofloxacin 250 MG tablet Commonly known as:  CIPRO   lisinopril-hydrochlorothiazide 10-12.5 MG tablet Commonly known as:  PRINZIDE,ZESTORETIC     TAKE these medications   aspirin EC 81 MG tablet Take 81 mg by mouth daily.   BREO ELLIPTA 100-25 MCG/INH Aepb Generic drug:  fluticasone furoate-vilanterol Inhale 1 puff into the lungs daily.   busPIRone 15 MG tablet Commonly known as:  BUSPAR TAKE 1 TABLET BY MOUTH TWICE DAILY   cholecalciferol 1000 units tablet Commonly known as:  VITAMIN D Take 1,000 Units by mouth daily.   furosemide 20 MG tablet Commonly known as:  LASIX Take 1 tablet (20 mg total) by mouth daily. Start taking on:  12/22/2016   ipratropium-albuterol 0.5-2.5 (3) MG/3ML Soln Commonly known as:  DUONEB Take 3 mLs by nebulization every 4 (four) hours.   metoprolol tartrate 25 MG tablet Commonly known as:  LOPRESSOR TAKE 1 TABLET BY MOUTH TWICE DAILY   multivitamin with minerals Tabs tablet Take 1 tablet by mouth daily.   OXYGEN Inhale 3 L into the lungs continuous.   potassium chloride 10 MEQ tablet Commonly known as:  K-DUR Take 1 tablet (10 mEq total) by mouth daily.   predniSONE 10 MG tablet Commonly known as:  DELTASONE Take 6 tablets (60 mg total) by mouth daily with  breakfast. And decrease by 1 tablet every 2 days Start taking on:  12/22/2016 What changed:    how much to take  additional instructions   tiotropium 18 MCG inhalation capsule Commonly known as:  SPIRIVA Place 18 mcg into inhaler and inhale daily.   traMADol 50 MG tablet Commonly known as:  ULTRAM Take 1 tablet (50 mg total) by mouth every 6 (six) hours as needed for moderate pain or severe pain.   VENTOLIN HFA 108 (90 Base) MCG/ACT inhaler Generic drug:  albuterol INHALE 2 PUFFS EVERY FOUR HOURS AS NEEDED FOR WHEEZING OR SHORTNESS OF BREATH       Allergies  Allergen Reactions  . Penicillins Swelling and Other (See Comments)    Reaction:  Unspecified swelling reaction Has patient had a PCN reaction causing immediate rash, facial/tongue/throat swelling, SOB or lightheadedness with hypotension: Yes Has patient had a PCN reaction causing severe rash involving mucus membranes or skin necrosis: No Has patient had a PCN reaction that required hospitalization No Has patient had a PCN reaction occurring within the last 10 years: Yes If all of the above answers are "NO", then may proceed with Cephalosporin use.  . Sulfa Antibiotics Hives and Other (See Comments)    Reaction:  Hallucinations   . Codeine Nausea Only  . Hydrocodone Nausea Only  . Levaquin [Levofloxacin] Itching, Swelling and Rash    Consultations:  none   Procedures/Studies: [place] Venous Img Lower Bilateral  Result Date: 12/18/2016 CLINICAL DATA:  71 year old female with a history of bilateral lower extremity pain  EXAM: BILATERAL LOWER EXTREMITY VENOUS DOPPLER ULTRASOUND TECHNIQUE: Gray-scale sonography with graded compression, as well as color Doppler and duplex ultrasound were performed to evaluate the lower extremity deep venous systems from the level of the common femoral vein and including the common femoral, femoral, profunda femoral, popliteal and calf veins including the posterior tibial, peroneal and  gastrocnemius veins when visible. The superficial great saphenous vein was also interrogated. Spectral Doppler was utilized to evaluate flow at rest and with distal augmentation maneuvers in the common femoral, femoral and popliteal veins. COMPARISON:  None. FINDINGS: RIGHT LOWER EXTREMITY Common Femoral Vein: No evidence of thrombus. Normal compressibility, respiratory phasicity and response to augmentation. Saphenofemoral Junction: No evidence of thrombus. Normal compressibility and flow on color Doppler imaging. Profunda Femoral Vein: No evidence of thrombus. Normal compressibility and flow on color Doppler imaging. Femoral Vein: No evidence of thrombus. Normal compressibility, respiratory phasicity and response to augmentation. Popliteal Vein: No evidence of thrombus. Normal compressibility, respiratory phasicity and response to augmentation. Calf Veins: No evidence of thrombus. Normal compressibility and flow on color Doppler imaging. Superficial Great Saphenous Vein: No evidence of thrombus. Normal compressibility and flow on color Doppler imaging. Other Findings:  None. LEFT LOWER EXTREMITY Common Femoral Vein: No evidence of thrombus. Normal compressibility, respiratory phasicity and response to augmentation. Saphenofemoral Junction: No evidence of thrombus. Normal compressibility and flow on color Doppler imaging. Profunda Femoral Vein: No evidence of thrombus. Normal compressibility and flow on color Doppler imaging. Femoral Vein: No evidence of thrombus. Normal compressibility, respiratory phasicity and response to augmentation. Popliteal Vein: No evidence of thrombus. Normal compressibility, respiratory phasicity and response to augmentation. Calf Veins: No evidence of thrombus. Normal compressibility and flow on color Doppler imaging. Superficial Great Saphenous Vein: No evidence of thrombus. Normal compressibility and flow on color Doppler imaging. Other Findings:  None. IMPRESSION: Sonographic survey  of the bilateral lower extremities negative for DVT. Signed, Yvone Neu. Loreta Ave, DO [facility] Rad[facility] Electronically Signed   By: Gilmer Mor D.O.   On: 12/18/2016 08:45   Dg Chest Port 1 View  Result Date: 12/18/2016 CLINICAL DATA:  Respiratory failure. Shortness of breath. Pulmonary edema. EXAM: PORTABLE CHEST 1 VIEW COMPARISON:  12/16/2016 FINDINGS: The cardiomediastinal silhouette is unchanged. Heart size is within normal limits. Aortic atherosclerosis is noted. Pulmonary vascular congestion is similar to the prior study. Mild bibasilar opacities have mildly improved. There is a persistent small left pleural effusion. No pneumothorax is identified. IMPRESSION: Unchanged pulmonary vascular congestion and small left pleural effusion. Mildly improved aeration of the lung bases. Electronically Signed   By: Sebastian Ache M.D.   On: 12/18/2016 08:19   Dg Chest Port 1 View  Result Date: 12/16/2016 CLINICAL DATA:  Shortness of breath tonight. EXAM: PORTABLE CHEST 1 VIEW COMPARISON:  Radiograph 11/09/2016 FINDINGS: Unchanged heart size and mediastinal contours. Aortic arch atherosclerosis. Increased vascular congestion. Mild peribronchial cuffing may be pulmonary edema. Small bilateral pleural effusions. Streaky bibasilar atelectasis. No pneumothorax. IMPRESSION: Mild CHF with vascular congestion, small pleural effusions and mild peribronchial thickening suspicious for pulmonary edema. Electronically Signed   By: Rubye Oaks M.D.   On: 12/16/2016 22:22        Discharge Exam: Vitals:   12/21/16 0750 12/21/16 0757  BP:    Pulse:    Resp:    Temp:    SpO2: 98% 100%   Vitals:   12/21/16 0601 12/21/16 0744 12/21/16 0750 12/21/16 0757  BP: 128/71     Pulse: Marland Kitchen)  103     Resp: 18     Temp: 98.1 F (36.7 C)     TempSrc: Oral     SpO2: 97% 95% 98% 100%  Weight: 67.8 kg (149 lb 7.6 oz)     Height:        General: Pt is alert, awake, not  in acute distress Cardiovascular: RRR, S1/S2 +, no rubs, no gallops Respiratory: CTA bilaterally, no wheezing, no rhonchi Abdominal: Soft, NT, ND, bowel sounds + Extremities: no edema, no cyanosis   The results of significant diagnostics from this hospitalization (including imaging, microbiology, ancillary and laboratory) are listed below for reference.    Significant Diagnostic Studies: US Venous Img Lower Bilateral  Result Date: 12/18/2016 CLINICAL DATA:  71 year old female with a history of bilateral lower extremity pain EXAM: BILATERAL LOWER EXTREMITY VENOUS DOPPLER ULTRASOUND TECHNIQUE: Gray-scale sonography with graded compression, as well as color Doppler and duplex ultrasound were performed to evaluate the lower extremity deep venous systems from the level of the common femoral vein and including the common femoral, femoral, profunda femoral, popliteal and calf veins including the posterior tibial, peroneal and gastrocnemius veins when visible. The superficial great saphenous vein was also interrogated. Spectral Doppler was utilized to evaluate flow at rest and with distal augmentation maneuvers in the common femoral, femoral and popliteal veins. COMPARISON:  None. FINDINGS: RIGHT LOWER EXTREMITY Common Femoral Vein: No evidence of thrombus. Normal compressibility, respiratory phasicity and response to augmentation. Saphenofemoral Junction: No evidence of thrombus. Normal compressibility and flow on color Doppler imaging. Profunda Femoral Vein: No evidence of thrombus. Normal compressibility and flow on color Doppler imaging. Femoral Vein: No evidence of thrombus. Normal compressibility, respiratory phasicity and response to augmentation. Popliteal Vein: No evidence of thrombus. Normal compressibility, respiratory phasicity and response to augmentation. Calf Veins: No evidence of thrombus. Normal compressibility and flow on color Doppler imaging. Superficial Great Saphenous Vein: No evidence of  thrombus. Normal compressibility and flow on color Doppler imaging. Other Findings:  None. LEFT LOWER EXTREMITY Common Femoral Vein: No evidence of thrombus. Normal compressibility, respiratory phasicity and response to augmentation. Saphenofemoral Junction: No evidence of thrombus. Normal compressibility and flow on color Doppler imaging. Profunda Femoral Vein: No evidence of thrombus. Normal compressibility and flow on color Doppler imaging. Femoral Vein: No evidence of thrombus. Normal compressibility, respiratory phasicity and response to augmentation. Popliteal Vein: No evidence of thrombus. Normal compressibility, respiratory phasicity and response to augmentation. Calf Veins: No evidence of thrombus. Normal compressibility and flow on color Doppler imaging. Superficial Great Saphenous Vein: No evidence of thrombus. Normal compressibility and flow on color Doppler imaging. Other Findings:  None. IMPRESSION: Sonographic survey of the bilateral lower extremities negative for DVT. Signed, Yvone Neu. Loreta Ave, DO [facility] Radiology Electronically Signed   By: Gilmer Mor D.O.   On: 12/18/2016 08:45   Dg Chest Port 1 View  Result Date: 12/18/2016 CLINICAL DATA:  Respiratory failure. Shortness of breath. Pulmonary edema. EXAM: PORTABLE CHEST 1 VIEW COMPARISON:  12/16/2016 FINDINGS: The cardiomediastinal silhouette is unchanged. Heart size is within normal limits. Aortic atherosclerosis is noted. Pulmonary vascular congestion is similar to the prior study. Mild bibasilar opacities have mildly improved. There is a persistent small left pleural effusion. No pneumothorax is identified. IMPRESSION: Unchanged pulmonary vascular congestion and small left pleural effusion. Mildly improved aeration of the lung bases. Electronically Signed   By: Sebastian Ache M.D.   On: 12/18/2016 08:19   Dg Chest [facility] 1 View  Result  Date: 12/16/2016 CLINICAL DATA:  Shortness of  breath tonight. EXAM: PORTABLE CHEST 1 VIEW COMPARISON:  Radiograph 11/09/2016 FINDINGS: Unchanged heart size and mediastinal contours. Aortic arch atherosclerosis. Increased vascular congestion. Mild peribronchial cuffing may be pulmonary edema. Small bilateral pleural effusions. Streaky bibasilar atelectasis. No pneumothorax. IMPRESSION: Mild CHF with vascular congestion, small pleural effusions and mild peribronchial thickening suspicious for pulmonary edema. Electronically Signed   By: Rubye Oaks M.D.   On: 12/16/2016 22:22     Microbiology: Recent Results (from the past 240 hour(s))  MRSA PCR Screening     Status: None   Collection Time: 12/17/16  3:28 AM  Result Value Ref Range Status   MRSA by PCR NEGATIVE NEGATIVE Final    Comment:        The GeneXpert MRSA Assay (FDA approved for NASAL specimens only), is one component of a comprehensive MRSA colonization surveillance program. It is not intended to diagnose MRSA infection nor to guide or monitor treatment for MRSA infections.   Respiratory Panel by PCR     Status: None   Collection Time: 12/17/16  8:26 AM  Result Value Ref Range Status   Adenovirus NOT DETECTED NOT DETECTED Final   Coronavirus 229E NOT DETECTED NOT DETECTED Final   Coronavirus HKU1 NOT DETECTED NOT DETECTED Final   Coronavirus NL63 NOT DETECTED NOT DETECTED Final   Coronavirus OC43 NOT DETECTED NOT DETECTED Final   Metapneumovirus NOT DETECTED NOT DETECTED Final   Rhinovirus / Enterovirus NOT DETECTED NOT DETECTED Final   Influenza A NOT DETECTED NOT DETECTED Final   Influenza B NOT DETECTED NOT DETECTED Final   Parainfluenza Virus 1 NOT DETECTED NOT DETECTED Final   Parainfluenza Virus 2 NOT DETECTED NOT DETECTED Final   Parainfluenza Virus 3 NOT DETECTED NOT DETECTED Final   Parainfluenza Virus 4 NOT DETECTED NOT DETECTED Final   Respiratory Syncytial Virus NOT DETECTED NOT DETECTED Final   Bordetella pertussis NOT DETECTED NOT DETECTED Final    Chlamydophila pneumoniae NOT DETECTED NOT DETECTED Final   Mycoplasma pneumoniae NOT DETECTED NOT DETECTED Final    Comment: Performed at [facility], [address] [place], [place] [address]     Labs: Basic Metabolic Panel: Recent Labs  Lab 12/17/16 [phone] 12/18/16 0535 12/19/16 0447 12/20/16 0437 12/21/16 0417  NA 134* 134* 137 135 136  K 3.7 4.6 4.0 3.6 3.7  CL 95* 96* 95* 93* 94*  CO2 28 30 33* 31 34*  GLUCOSE 185* 145* 164* 207* 131*  BUN 25* 31* 42* 49* 46*  CREATININE 1.44* 1.28* 1.41* 1.37* 1.30*  CALCIUM 9.7 9.8 9.8 9.2 9.1  MG  --   --  2.2  --   --    Liver Function Tests: No results for input(s): AST, ALT, ALKPHOS, BILITOT, PROT, ALBUMIN in the last 168 hours. No results for input(s): LIPASE, AMYLASE in the last 168 hours. No results for input(s): AMMONIA in the last 168 hours. CBC: Recent Labs  Lab 12/16/16 2153  WBC 8.0  HGB 11.1*  HCT 38.5  MCV 96.3  PLT 264   Cardiac Enzymes: No results for input(s): CKTOTAL, CKMB, CKMBINDEX, TROPONINI in the last 168 hours. BNP: Invalid input(s): POCBNP CBG: Recent Labs  Lab 12/20/16 0744 12/20/16 1145 12/20/16 1628 12/20/16 2057 12/21/16 0737  GLUCAP 165* 144* 158* 188* 95    Time coordinating discharge:  Greater than 30 minutes  Signed:  Demonta Wombles, DO [facility] Pager: [phone] 12/21/2016, 11:46 AM

## 2016-12-20 NOTE — Progress Notes (Signed)
Called report and transported to room 304

## 2016-12-20 NOTE — Progress Notes (Signed)
PROGRESS NOTE  Sheila Pierce EXB:[id number] DOB: 02-02-1945 DOA: 12/16/2016 PCP: [name], MD  Brief History: 71 year old female with a history of COPD/emphysema, previous tobacco abuse, anxiety/depression, chronic diastolic CHF, CKD stage III, diabetes mellitus, hyperlipidemia, essential hypertension, andhistory of non-STEMI with nonobstructive CAD on cath in 2014 who is brought to the emergency department via EMS due to progressively worse shortness of breathassociated with wheezing andnonproductivecoughthat began in the afternoon of December 16, 2016. The patient quit smoking over 5 years ago. She denies any fevers, chills, chest pain, hemoptysis, nausea, vomiting, diarrhea, abdominal pain. The patient was recently admitted to [facility] November 13, 2016 through November 16, 2016 for COPD exacerbation. She was discharged with a prednisone taper. In addition, she also had admission from October 31, 2015 through November 03, 2015 for COPD exacerbation for which she required BiPAP for short period of time. Upon presentation, the patient was placed on BiPAP and was given Solu-Medrol 125 mg IV x1 and furosemide 40 mg IV x1. The patient was subsequently weaned off of BiPAP on the morning of December 17, 2016.  Overall, the patient improved albeit slow.  Unfortunately, she continues to have very low functional reserve.      Assessment/Plan: Acute on chronic respiratory failure with hypoxia and hypercarbia -Secondary to COPD exacerbation and CHF (COPD is major contributor) -Weaned off of BiPAP December 17, 2016 -Wean back to baseline 3 L for oxygen saturation greater than 92% -11/24 CXR personally reviewed--mild vascular congestion  COPD exacerbation -ContinuePulmicort nebulizers -Continue duo nebs -Checkrespiratory viral panel--neg -Continue IV Solu-Medrol-- 60 mg IV q 6hours>>>transition to po prednisone -very little functional reserve--significant  dyspnea/desaturation just to [facility] -morphine solution prn refractory dyspnea -added Brovana  Acute on chronic diastolic CHF -personally reviewed CXR--vascular congestion -received lasix 40 mg IV x 2 during admission with clinical improvement and improved Na -Echo--EF 65-70%, grade 1 DD, no WMA, mild TR -start lasix 20 mg daily  CKD stage III -Baseline creatinine 1.2-1.4 -A.m. BMP  Essential hypertension -holding HCTZ, metoprolol succinate, lisinopril secondary to hypotension/soft BPs  Diabetes mellitus type 2, controlled -November 11, 2016 hemoglobin A1c 5.2 -Elevated CBG secondary to steroids -NovoLog sliding scale while on steroids  Coronary artery disease with historyNSTEMI -No chest pain presently -personally reviewed EKG--sinus rhythm, nonspecific T wave changes  Lower extremity pain -Venous duplex--negative for DVT  Anxiety/depression -Continue home dose of BuSpar -Patient has a history of situational anxiety  GERD -Continue PPI    Disposition Plan: Home 11/27 if stable Family Communication:NoFamily at bedside  Consultants:none  Code Status: DNR  DVT Prophylaxis: Marissa Lovenox   Procedures: As Listed in Progress Note Above  Antibiotics: None      Subjective: Overall, patient states that her breathing continues to improve she becomes dyspneic with mild exertion.  She denies any nausea, vomiting, diarrhea, abdominal pain.  No dysuria hematuria.  Objective: Vitals:   12/20/16 1400 12/20/16 1500 12/20/16 1543 12/20/16 1600  BP: (!) 119/53 (!) 156/62  (!) 155/82  Pulse: (!) 103 (!) 104  (!) 103  Resp: 12 13  13   Temp:    98 F (36.7 C)  TempSrc:    Axillary  SpO2: 97% 96% 98% 98%  Weight:      Height:        Intake/Output Summary (Last 24 hours) at 12/20/2016 1709 Last data filed at 12/20/2016 1600 Gross per 24 hour  Intake 1440 ml  Output 2300 ml  Net -860  ml   Weight change: 1.6 kg (3 lb 8.4  oz) Exam:   General:  Pt is alert, follows commands appropriately, not in acute distress  HEENT: No icterus, No thrush, No neck mass, [place]/AT  Cardiovascular: RRR, S1/S2, no rubs, no gallops  Respiratory: Bilateral scattered rales.  Diminished breath sounds at the bases.  No wheezing.  Abdomen: Soft/+BS, non tender, non distended, no guarding  Extremities: trace LE edema, No lymphangitis, No petechiae, No rashes, no synovitis   Data Reviewed: I have personally reviewed following labs and imaging studies Basic Metabolic Panel: Recent Labs  Lab 12/16/16 2153 12/17/16 0553 12/18/16 0535 12/19/16 0447 12/20/16 0437  NA 135 134* 134* 137 135  K 3.9 3.7 4.6 4.0 3.6  CL 95* 95* 96* 95* 93*  CO2 35* 28 30 33* 31  GLUCOSE 159* 185* 145* 164* 207*  BUN 23* 25* 31* 42* 49*  CREATININE 1.46* 1.44* 1.28* 1.41* 1.37*  CALCIUM 10.3 9.7 9.8 9.8 9.2  MG  --   --   --  2.2  --    Liver Function Tests: No results for input(s): AST, ALT, ALKPHOS, BILITOT, PROT, ALBUMIN in the last 168 hours. No results for input(s): LIPASE, AMYLASE in the last 168 hours. No results for input(s): AMMONIA in the last 168 hours. Coagulation Profile: No results for input(s): INR, PROTIME in the last 168 hours. CBC: Recent Labs  Lab 12/16/16 2153  WBC 8.0  HGB 11.1*  HCT 38.5  MCV 96.3  PLT 264   Cardiac Enzymes: No results for input(s): CKTOTAL, CKMB, CKMBINDEX, TROPONINI in the last 168 hours. BNP: Invalid input(s): POCBNP CBG: Recent Labs  Lab 12/19/16 1605 12/19/16 2104 12/20/16 0744 12/20/16 1145 12/20/16 1628  GLUCAP 133* 155* 165* 144* 158*   HbA1C: No results for input(s): HGBA1C in the last 72 hours. Urine analysis:    Component Value Date/Time   COLORURINE YELLOW 04/16/2016 1920   APPEARANCEUR HAZY (A) 04/16/2016 1920   LABSPEC 1.006 04/16/2016 1920   PHURINE 6.0 04/16/2016 1920   GLUCOSEU NEGATIVE 04/16/2016 1920   HGBUR SMALL (A) 04/16/2016 1920   BILIRUBINUR neg  11/24/2016 1131   KETONESUR NEGATIVE 04/16/2016 1920   PROTEINUR neg 11/24/2016 1131   PROTEINUR NEGATIVE 04/16/2016 1920   UROBILINOGEN 0.2 11/24/2016 1131   NITRITE neg 11/24/2016 1131   NITRITE POSITIVE (A) 04/16/2016 1920   LEUKOCYTESUR Small (1+) (A) 11/24/2016 1131   Sepsis Labs: @LABRCNTIP (procalcitonin:4,lacticidven:4) ) Recent Results (from the past 240 hour(s))  MRSA PCR Screening     Status: None   Collection Time: 12/17/16  3:28 AM  Result Value Ref Range Status   MRSA by PCR NEGATIVE NEGATIVE Final    Comment:        The GeneXpert MRSA Assay (FDA approved for NASAL specimens only), is one component of a comprehensive MRSA colonization surveillance program. It is not intended to diagnose MRSA infection nor to guide or monitor treatment for MRSA infections.   Respiratory Panel by PCR     Status: None   Collection Time: 12/17/16  8:26 AM  Result Value Ref Range Status   Adenovirus NOT DETECTED NOT DETECTED Final   Coronavirus 229E NOT DETECTED NOT DETECTED Final   Coronavirus HKU1 NOT DETECTED NOT DETECTED Final   Coronavirus NL63 NOT DETECTED NOT DETECTED Final   Coronavirus OC43 NOT DETECTED NOT DETECTED Final   Metapneumovirus NOT DETECTED NOT DETECTED Final   Rhinovirus / Enterovirus NOT DETECTED NOT DETECTED Final   Influenza A NOT DETECTED  NOT DETECTED Final   Influenza B NOT DETECTED NOT DETECTED Final   Parainfluenza Virus 1 NOT DETECTED NOT DETECTED Final   Parainfluenza Virus 2 NOT DETECTED NOT DETECTED Final   Parainfluenza Virus 3 NOT DETECTED NOT DETECTED Final   Parainfluenza Virus 4 NOT DETECTED NOT DETECTED Final   Respiratory Syncytial Virus NOT DETECTED NOT DETECTED Final   Bordetella pertussis NOT DETECTED NOT DETECTED Final   Chlamydophila pneumoniae NOT DETECTED NOT DETECTED Final   Mycoplasma pneumoniae NOT DETECTED NOT DETECTED Final    Comment: Performed at [facility], [address] [place], [place] [address]     Scheduled  Meds: . arformoterol  15 mcg Nebulization BID  . aspirin EC  81 mg Oral Daily  . budesonide (PULMICORT) nebulizer solution  0.5 mg Nebulization BID  . busPIRone  15 mg Oral BID  . cholecalciferol  1,000 Units Oral Daily  . enoxaparin (LOVENOX) injection  40 mg Subcutaneous QHS  . furosemide  20 mg Oral Daily  . insulin aspart  0-9 Units Subcutaneous TID WC  . ipratropium-albuterol  3 mL Nebulization Q4H  . methylPREDNISolone (SOLU-MEDROL) injection  60 mg Intravenous Q6H  . metoprolol tartrate  25 mg Oral BID  . multivitamin with minerals  1 tablet Oral Daily  . pantoprazole  40 mg Oral Daily   Continuous Infusions:  Procedures/Studies: [place] Venous Img Lower Bilateral  Result Date: 12/18/2016 CLINICAL DATA:  71 year old female with a history of bilateral lower extremity pain EXAM: BILATERAL LOWER EXTREMITY VENOUS DOPPLER ULTRASOUND TECHNIQUE: Gray-scale sonography with graded compression, as well as color Doppler and duplex ultrasound were performed to evaluate the lower extremity deep venous systems from the level of the common femoral vein and including the common femoral, femoral, profunda femoral, popliteal and calf veins including the posterior tibial, peroneal and gastrocnemius veins when visible. The superficial great saphenous vein was also interrogated. Spectral Doppler was utilized to evaluate flow at rest and with distal augmentation maneuvers in the common femoral, femoral and popliteal veins. COMPARISON:  None. FINDINGS: RIGHT LOWER EXTREMITY Common Femoral Vein: No evidence of thrombus. Normal compressibility, respiratory phasicity and response to augmentation. Saphenofemoral Junction: No evidence of thrombus. Normal compressibility and flow on color Doppler imaging. Profunda Femoral Vein: No evidence of thrombus. Normal compressibility and flow on color Doppler imaging. Femoral Vein: No evidence of thrombus. Normal compressibility, respiratory phasicity and response to augmentation.  Popliteal Vein: No evidence of thrombus. Normal compressibility, respiratory phasicity and response to augmentation. Calf Veins: No evidence of thrombus. Normal compressibility and flow on color Doppler imaging. Superficial Great Saphenous Vein: No evidence of thrombus. Normal compressibility and flow on color Doppler imaging. Other Findings:  None. LEFT LOWER EXTREMITY Common Femoral Vein: No evidence of thrombus. Normal compressibility, respiratory phasicity and response to augmentation. Saphenofemoral Junction: No evidence of thrombus. Normal compressibility and flow on color Doppler imaging. Profunda Femoral Vein: No evidence of thrombus. Normal compressibility and flow on color Doppler imaging. Femoral Vein: No evidence of thrombus. Normal compressibility, respiratory phasicity and response to augmentation. Popliteal Vein: No evidence of thrombus. Normal compressibility, respiratory phasicity and response to augmentation. Calf Veins: No evidence of thrombus. Normal compressibility and flow on color Doppler imaging. Superficial Great Saphenous Vein: No evidence of thrombus. Normal compressibility and flow on color Doppler imaging. Other Findings:  None. IMPRESSION: Sonographic survey of the bilateral lower extremities negative for DVT. Signed, Yvone NeuJaime S. Loreta AveWagner, DO [facility] Electronically Signed   By: Marijean NiemannJaime  Loreta Ave D.O.   On: 12/18/2016 08:45   Dg Chest Port 1 View  Result Date: 12/18/2016 CLINICAL DATA:  Respiratory failure. Shortness of breath. Pulmonary edema. EXAM: PORTABLE CHEST 1 VIEW COMPARISON:  12/16/2016 FINDINGS: The cardiomediastinal silhouette is unchanged. Heart size is within normal limits. Aortic atherosclerosis is noted. Pulmonary vascular congestion is similar to the prior study. Mild bibasilar opacities have mildly improved. There is a persistent small left pleural effusion. No pneumothorax is identified. IMPRESSION: Unchanged  pulmonary vascular congestion and small left pleural effusion. Mildly improved aeration of the lung bases. Electronically Signed   By: Sebastian Ache M.D.   On: 12/18/2016 08:19   Dg Chest Port 1 View  Result Date: 12/16/2016 CLINICAL DATA:  Shortness of breath tonight. EXAM: PORTABLE CHEST 1 VIEW COMPARISON:  Radiograph 11/09/2016 FINDINGS: Unchanged heart size and mediastinal contours. Aortic arch atherosclerosis. Increased vascular congestion. Mild peribronchial cuffing may be pulmonary edema. Small bilateral pleural effusions. Streaky bibasilar atelectasis. No pneumothorax. IMPRESSION: Mild CHF with vascular congestion, small pleural effusions and mild peribronchial thickening suspicious for pulmonary edema. Electronically Signed   By: Rubye Oaks M.D.   On: 12/16/2016 22:22    Joice Nazario, DO  [facility] Pager [phone]  If 7PM-7AM, please contact night-coverage [email or web address] Password [email or web address] 12/20/2016, 5:09 PM   LOS: 4 days

## 2016-12-21 LAB — BASIC METABOLIC PANEL
ANION GAP: 8 (ref 5–15)
BUN: 46 mg/dL — AB (ref 6–20)
CALCIUM: 9.1 mg/dL (ref 8.9–10.3)
CO2: 34 mmol/L — AB (ref 22–32)
Chloride: 94 mmol/L — ABNORMAL LOW (ref 101–111)
Creatinine, Ser: 1.3 mg/dL — ABNORMAL HIGH (ref 0.44–1.00)
GFR calc Af Amer: 46 mL/min — ABNORMAL LOW (ref >60)
GFR calc non Af Amer: 40 mL/min — ABNORMAL LOW (ref >60)
GLUCOSE: 131 mg/dL — AB (ref 65–99)
Potassium: 3.7 mmol/L (ref 3.5–5.1)
Sodium: 136 mmol/L (ref 135–145)

## 2016-12-21 LAB — GLUCOSE, CAPILLARY
GLUCOSE-CAPILLARY: 95 mg/dL (ref 65–99)
GLUCOSE-CAPILLARY: 95 mg/dL (ref 65–99)

## 2016-12-21 MED ORDER — FUROSEMIDE 20 MG PO TABS: 20.0000 mg | ORAL_TABLET | Freq: Every day | ORAL | 1 refills | Status: DC

## 2016-12-21 MED ORDER — PREDNISONE 10 MG PO TABS: 60.0000 mg | ORAL_TABLET | Freq: Every day | ORAL | 0 refills | Status: DC

## 2016-12-21 MED ORDER — ACETAMINOPHEN 325 MG PO TABS: 650.0000 mg | ORAL_TABLET | Freq: Four times a day (QID) | ORAL | Status: DC | PRN

## 2016-12-21 NOTE — Progress Notes (Signed)
Pt discharged home today per Dr. Tat. Pt's IV site D/C'd and WDL. Pt's VSS. Pt provided with home medication list, discharge instructions and prescriptions. Verbalized understanding. Pt left floor via WC in stable condition accompanied by NT. 

## 2016-12-21 NOTE — Care Management Note (Signed)
Case Management Note  Patient Details  Name: Sheila Pierce MRN: [id number] Date of Birth: 02-Oct-1945    Expected Discharge Date:  12/21/16               Expected Discharge Plan:  Home/Self Care  In-House Referral:  NA  Discharge planning Services  CM Consult  Post Acute Care Choice:  NA Choice offered to:  NA  DME Arranged:    DME Agency:     HH Arranged:    HH Agency:     Status of Service:  Completed, signed off  If discussed at Long Length of Stay Meetings, dates discussed:   12/21/2016 Additional Comments: Patient discharging home. Refuses home health or placement. Will DC home with son.   Aneeka Bowden, Chrystine Oiler, RN 12/21/2016, 12:01 PM

## 2016-12-21 NOTE — Clinical Social Work Note (Signed)
LCSW spoke with patient's son, Lorin Picket, in regards to discharge planning. Lorin Picket stated that he wanted patient to come home at discharge. He stated that there were people in the home assisting him in providing care for patient. He also discussed that they were moving in to a different home around the first of December. Scott discussed that the current home they live in is not inhabitable due to it being infested with roaches, rats and bedbugs that the landlord will not treat. He stated that patient had an APS worker who was assisting him in locating additional housing.  Lorin Picket stated that despite the upcoming move, he chose for patient to return to their home at discharge.   LCSW signing off.     Yuepheng Schaller, Juleen China, LCSW

## 2016-12-21 NOTE — Progress Notes (Signed)
Physical Therapy Treatment Patient Details Name: Sheila Pierce MRN: [id number] DOB: 03/19/1945 Today's Date: 12/21/2016    History of Present Illness Sheila Pierce is a 71 y.o. female with medical history significant of allergies/asthma, COPD/emphysema, previous tobacco abuse, anemia, anxiety, depression, osteoarthritis, cataracts, macular degeneration, chronic diastolic CHF, chronic kidney disease, type 2 diabetes, GERD, hypercholesterolemia, hypertension, trigeminal neuralgia, history of non-STEMI with nonobstructive CAD on cath in 2014 who is brought to the emergency department via EMS due to progressively worse shortness of breath associated with wheezing and dry cough since earlier today.    PT Comments    Pt able to transfer self to EOB and take several steps to [facility] [place] with supervision only.  Pt does display impulsivities and very SOB with all activities.  NT present during session and assisted with hygiene following BM and began bath after ambulating to chair.  Pt major limitation remains her breathing at this point.  Pt struggles with SOB at rest as well.   Follow Up Recommendations        Equipment Recommendations       Recommendations for Other Services       Precautions / Restrictions Precautions Precautions: Fall Restrictions Weight Bearing Restrictions: No    Mobility  Bed Mobility Overal bed mobility: Needs Assistance Bed Mobility: Supine to Sit     Supine to sit: Supervision Sit to supine: Supervision      Transfers Overall transfer level: Needs assistance Equipment used: None;1 person hand held assist Transfers: Sit to/from Stand Sit to Stand: Min guard            Ambulation/Gait Ambulation/Gait assistance: Min assist Ambulation Distance (Feet): 5 Feet Assistive device: None       General Gait Details: limited to 5 steps total to bedside commode and to chair.  pt very unsteady and SOB   Stairs            Wheelchair Mobility     Modified Rankin (Stroke Patients Only)       Balance                                            Cognition Arousal/Alertness: Awake/alert Behavior During Therapy: WFL for tasks assessed/performed Overall Cognitive Status: Within Functional Limits for tasks assessed                                        Exercises      General Comments        Pertinent Vitals/Pain Pain Assessment: No/denies pain    Home Living                      Prior Function            PT Goals (current goals can now be found in the care plan section) Progress towards PT goals: Progressing toward goals    Frequency           PT Plan      Co-evaluation              AM-PAC PT "6 Clicks" Daily Activity  Outcome Measure                   End of Session Equipment Utilized During Treatment: Oxygen Activity  Tolerance: Patient limited by fatigue Patient left: in chair;with nursing/sitter in room         Time: 1100-1122 PT Time Calculation (min) (ACUTE ONLY): 22 min  Charges:  $Therapeutic Activity: 8-22 mins                    G Codes:       Sheila Pierce, PTA/CLT [phone]    Sheila Pierce, Sheila Pierce 12/21/2016, 12:03 PM

## 2016-12-21 NOTE — Clinical Social Work Note (Signed)
Late entry for 12/10/16: LCSW spoke with patient about discharge planning per request of patient's nurse. Patient states that she is unsure as to whether she wants to go to custodial care under her Medicaid. She stated that she will miss her family and pets. She advised LCSW to speak with her son, Lorin Picket, about the discharge plan. Patient stated that multiple people live in the home with her and assist in her care.    Faye Sanfilippo, Juleen China, LCSW

## 2016-12-21 NOTE — Care Management Important Message (Signed)
Important Message  Patient Details  Name: Sheila Pierce MRN: [id number] Date of Birth: November 04, 1945   Medicare Important Message Given:  Yes    Ilario Dhaliwal, Chrystine Oiler, RN 12/21/2016, 12:03 PM

## 2016-12-22 DIAGNOSIS — R5381 Other malaise: Secondary | ICD-10-CM | POA: Diagnosis not present

## 2016-12-22 DIAGNOSIS — D62 Acute posthemorrhagic anemia: Secondary | ICD-10-CM | POA: Diagnosis not present

## 2016-12-22 DIAGNOSIS — Z87891 Personal history of nicotine dependence: Secondary | ICD-10-CM | POA: Diagnosis not present

## 2016-12-22 DIAGNOSIS — R0682 Tachypnea, not elsewhere classified: Secondary | ICD-10-CM | POA: Diagnosis not present

## 2016-12-22 DIAGNOSIS — E872 Acidosis: Secondary | ICD-10-CM | POA: Diagnosis not present

## 2016-12-22 DIAGNOSIS — T380X5A Adverse effect of glucocorticoids and synthetic analogues, initial encounter: Secondary | ICD-10-CM | POA: Diagnosis not present

## 2016-12-22 DIAGNOSIS — Z886 Allergy status to analgesic agent status: Secondary | ICD-10-CM | POA: Diagnosis not present

## 2016-12-22 DIAGNOSIS — J9600 Acute respiratory failure, unspecified whether with hypoxia or hypercapnia: Secondary | ICD-10-CM | POA: Diagnosis not present

## 2016-12-22 DIAGNOSIS — R945 Abnormal results of liver function studies: Secondary | ICD-10-CM | POA: Diagnosis not present

## 2016-12-22 DIAGNOSIS — Z9981 Dependence on supplemental oxygen: Secondary | ICD-10-CM | POA: Diagnosis not present

## 2016-12-22 DIAGNOSIS — D72829 Elevated white blood cell count, unspecified: Secondary | ICD-10-CM | POA: Diagnosis not present

## 2016-12-22 DIAGNOSIS — J9811 Atelectasis: Secondary | ICD-10-CM | POA: Diagnosis not present

## 2016-12-22 DIAGNOSIS — J9612 Chronic respiratory failure with hypercapnia: Secondary | ICD-10-CM | POA: Diagnosis not present

## 2016-12-22 DIAGNOSIS — Z882 Allergy status to sulfonamides status: Secondary | ICD-10-CM | POA: Diagnosis not present

## 2016-12-22 DIAGNOSIS — D649 Anemia, unspecified: Secondary | ICD-10-CM | POA: Diagnosis not present

## 2016-12-22 DIAGNOSIS — Z880 Allergy status to penicillin: Secondary | ICD-10-CM | POA: Diagnosis not present

## 2016-12-22 DIAGNOSIS — R791 Abnormal coagulation profile: Secondary | ICD-10-CM | POA: Diagnosis not present

## 2016-12-22 DIAGNOSIS — J441 Chronic obstructive pulmonary disease with (acute) exacerbation: Secondary | ICD-10-CM | POA: Diagnosis not present

## 2016-12-22 DIAGNOSIS — J9611 Chronic respiratory failure with hypoxia: Secondary | ICD-10-CM | POA: Diagnosis not present

## 2016-12-22 DIAGNOSIS — J9620 Acute and chronic respiratory failure, unspecified whether with hypoxia or hypercapnia: Secondary | ICD-10-CM | POA: Diagnosis not present

## 2016-12-22 DIAGNOSIS — E871 Hypo-osmolality and hyponatremia: Secondary | ICD-10-CM | POA: Diagnosis not present

## 2016-12-22 DIAGNOSIS — R2232 Localized swelling, mass and lump, left upper limb: Secondary | ICD-10-CM | POA: Diagnosis not present

## 2016-12-22 DIAGNOSIS — M7989 Other specified soft tissue disorders: Secondary | ICD-10-CM | POA: Diagnosis not present

## 2016-12-22 DIAGNOSIS — J449 Chronic obstructive pulmonary disease, unspecified: Secondary | ICD-10-CM | POA: Diagnosis not present

## 2016-12-22 DIAGNOSIS — I959 Hypotension, unspecified: Secondary | ICD-10-CM | POA: Diagnosis not present

## 2016-12-25 DIAGNOSIS — J449 Chronic obstructive pulmonary disease, unspecified: Secondary | ICD-10-CM | POA: Diagnosis not present

## 2016-12-25 DIAGNOSIS — R609 Edema, unspecified: Secondary | ICD-10-CM | POA: Diagnosis not present

## 2016-12-25 DIAGNOSIS — J9611 Chronic respiratory failure with hypoxia: Secondary | ICD-10-CM | POA: Diagnosis not present

## 2016-12-25 DIAGNOSIS — R2232 Localized swelling, mass and lump, left upper limb: Secondary | ICD-10-CM | POA: Diagnosis not present

## 2016-12-25 DIAGNOSIS — D649 Anemia, unspecified: Secondary | ICD-10-CM | POA: Diagnosis not present

## 2016-12-25 DIAGNOSIS — E871 Hypo-osmolality and hyponatremia: Secondary | ICD-10-CM | POA: Diagnosis not present

## 2016-12-26 DIAGNOSIS — E871 Hypo-osmolality and hyponatremia: Secondary | ICD-10-CM | POA: Diagnosis not present

## 2016-12-26 DIAGNOSIS — R2232 Localized swelling, mass and lump, left upper limb: Secondary | ICD-10-CM | POA: Diagnosis not present

## 2016-12-26 DIAGNOSIS — D72829 Elevated white blood cell count, unspecified: Secondary | ICD-10-CM | POA: Diagnosis not present

## 2016-12-26 DIAGNOSIS — D62 Acute posthemorrhagic anemia: Secondary | ICD-10-CM | POA: Diagnosis not present

## 2016-12-26 DIAGNOSIS — J449 Chronic obstructive pulmonary disease, unspecified: Secondary | ICD-10-CM | POA: Diagnosis not present

## 2016-12-26 DIAGNOSIS — R5381 Other malaise: Secondary | ICD-10-CM | POA: Diagnosis not present

## 2016-12-27 DIAGNOSIS — R5381 Other malaise: Secondary | ICD-10-CM | POA: Diagnosis not present

## 2016-12-27 DIAGNOSIS — R600 Localized edema: Secondary | ICD-10-CM | POA: Diagnosis not present

## 2016-12-27 DIAGNOSIS — Z9981 Dependence on supplemental oxygen: Secondary | ICD-10-CM | POA: Diagnosis not present

## 2016-12-27 DIAGNOSIS — J9611 Chronic respiratory failure with hypoxia: Secondary | ICD-10-CM | POA: Diagnosis not present

## 2016-12-27 DIAGNOSIS — E871 Hypo-osmolality and hyponatremia: Secondary | ICD-10-CM | POA: Diagnosis not present

## 2016-12-27 DIAGNOSIS — J441 Chronic obstructive pulmonary disease with (acute) exacerbation: Secondary | ICD-10-CM | POA: Diagnosis not present

## 2016-12-27 DIAGNOSIS — D62 Acute posthemorrhagic anemia: Secondary | ICD-10-CM | POA: Diagnosis not present

## 2016-12-27 DIAGNOSIS — R200 Anesthesia of skin: Secondary | ICD-10-CM | POA: Diagnosis not present

## 2016-12-27 DIAGNOSIS — J449 Chronic obstructive pulmonary disease, unspecified: Secondary | ICD-10-CM | POA: Diagnosis not present

## 2016-12-27 DIAGNOSIS — K5909 Other constipation: Secondary | ICD-10-CM | POA: Diagnosis not present

## 2016-12-27 DIAGNOSIS — M7989 Other specified soft tissue disorders: Secondary | ICD-10-CM | POA: Diagnosis not present

## 2016-12-27 DIAGNOSIS — T8089XA Other complications following infusion, transfusion and therapeutic injection, initial encounter: Secondary | ICD-10-CM | POA: Diagnosis not present

## 2016-12-27 DIAGNOSIS — M7981 Nontraumatic hematoma of soft tissue: Secondary | ICD-10-CM | POA: Diagnosis not present

## 2016-12-27 DIAGNOSIS — J438 Other emphysema: Secondary | ICD-10-CM | POA: Diagnosis not present

## 2016-12-27 DIAGNOSIS — J9612 Chronic respiratory failure with hypercapnia: Secondary | ICD-10-CM | POA: Diagnosis not present

## 2016-12-27 DIAGNOSIS — R32 Unspecified urinary incontinence: Secondary | ICD-10-CM | POA: Diagnosis not present

## 2016-12-27 DIAGNOSIS — M818 Other osteoporosis without current pathological fracture: Secondary | ICD-10-CM | POA: Diagnosis not present

## 2016-12-28 DIAGNOSIS — M7981 Nontraumatic hematoma of soft tissue: Secondary | ICD-10-CM | POA: Diagnosis not present

## 2016-12-28 DIAGNOSIS — J9612 Chronic respiratory failure with hypercapnia: Secondary | ICD-10-CM | POA: Diagnosis not present

## 2016-12-28 DIAGNOSIS — D62 Acute posthemorrhagic anemia: Secondary | ICD-10-CM | POA: Diagnosis not present

## 2016-12-28 DIAGNOSIS — R5381 Other malaise: Secondary | ICD-10-CM | POA: Diagnosis not present

## 2016-12-28 DIAGNOSIS — K5909 Other constipation: Secondary | ICD-10-CM | POA: Diagnosis not present

## 2016-12-28 DIAGNOSIS — J9611 Chronic respiratory failure with hypoxia: Secondary | ICD-10-CM | POA: Diagnosis not present

## 2016-12-28 DIAGNOSIS — J441 Chronic obstructive pulmonary disease with (acute) exacerbation: Secondary | ICD-10-CM | POA: Diagnosis not present

## 2016-12-29 DIAGNOSIS — J9611 Chronic respiratory failure with hypoxia: Secondary | ICD-10-CM | POA: Diagnosis not present

## 2016-12-29 DIAGNOSIS — J449 Chronic obstructive pulmonary disease, unspecified: Secondary | ICD-10-CM | POA: Diagnosis not present

## 2016-12-29 DIAGNOSIS — D62 Acute posthemorrhagic anemia: Secondary | ICD-10-CM | POA: Diagnosis not present

## 2016-12-29 DIAGNOSIS — J9612 Chronic respiratory failure with hypercapnia: Secondary | ICD-10-CM | POA: Diagnosis not present

## 2016-12-29 DIAGNOSIS — M7989 Other specified soft tissue disorders: Secondary | ICD-10-CM | POA: Diagnosis not present

## 2016-12-31 ENCOUNTER — Telehealth: Payer: Self-pay

## 2016-12-31 ENCOUNTER — Encounter: Payer: Self-pay | Admitting: Family Medicine

## 2016-12-31 DIAGNOSIS — J441 Chronic obstructive pulmonary disease with (acute) exacerbation: Secondary | ICD-10-CM | POA: Diagnosis not present

## 2016-12-31 DIAGNOSIS — R32 Unspecified urinary incontinence: Secondary | ICD-10-CM | POA: Diagnosis not present

## 2016-12-31 DIAGNOSIS — M818 Other osteoporosis without current pathological fracture: Secondary | ICD-10-CM | POA: Diagnosis not present

## 2016-12-31 DIAGNOSIS — J438 Other emphysema: Secondary | ICD-10-CM | POA: Diagnosis not present

## 2016-12-31 DIAGNOSIS — L89152 Pressure ulcer of sacral region, stage 2: Secondary | ICD-10-CM | POA: Diagnosis not present

## 2016-12-31 DIAGNOSIS — J449 Chronic obstructive pulmonary disease, unspecified: Secondary | ICD-10-CM | POA: Diagnosis not present

## 2016-12-31 DIAGNOSIS — E440 Moderate protein-calorie malnutrition: Secondary | ICD-10-CM | POA: Diagnosis not present

## 2016-12-31 DIAGNOSIS — J440 Chronic obstructive pulmonary disease with acute lower respiratory infection: Secondary | ICD-10-CM | POA: Diagnosis not present

## 2016-12-31 NOTE — Telephone Encounter (Signed)
Called to check on Sheila Pierce

## 2016-12-31 NOTE — Telephone Encounter (Signed)
Called to ck on Sheila Pierce as she missed her appt today, unable to get son scott or case worker johnny.

## 2017-01-01 ENCOUNTER — Encounter (HOSPITAL_COMMUNITY): Payer: Self-pay

## 2017-01-01 ENCOUNTER — Emergency Department (HOSPITAL_COMMUNITY): Payer: Medicare HMO

## 2017-01-01 ENCOUNTER — Emergency Department (HOSPITAL_COMMUNITY)
Admission: EM | Admit: 2017-01-01 | Discharge: 2017-01-01 | Disposition: A | Discharge status: Home / Self Care | Payer: Medicare HMO | Attending: Emergency Medicine | Admitting: Emergency Medicine

## 2017-01-01 ENCOUNTER — Other Ambulatory Visit: Payer: Self-pay

## 2017-01-01 DIAGNOSIS — E1122 Type 2 diabetes mellitus with diabetic chronic kidney disease: Secondary | ICD-10-CM | POA: Diagnosis not present

## 2017-01-01 DIAGNOSIS — N183 Chronic kidney disease, stage 3 (moderate): Secondary | ICD-10-CM | POA: Insufficient documentation

## 2017-01-01 DIAGNOSIS — R0682 Tachypnea, not elsewhere classified: Secondary | ICD-10-CM | POA: Diagnosis not present

## 2017-01-01 DIAGNOSIS — J45909 Unspecified asthma, uncomplicated: Secondary | ICD-10-CM | POA: Insufficient documentation

## 2017-01-01 DIAGNOSIS — R0600 Dyspnea, unspecified: Secondary | ICD-10-CM | POA: Insufficient documentation

## 2017-01-01 DIAGNOSIS — Z7982 Long term (current) use of aspirin: Secondary | ICD-10-CM | POA: Insufficient documentation

## 2017-01-01 DIAGNOSIS — R0602 Shortness of breath: Secondary | ICD-10-CM | POA: Diagnosis not present

## 2017-01-01 DIAGNOSIS — Z87891 Personal history of nicotine dependence: Secondary | ICD-10-CM | POA: Diagnosis not present

## 2017-01-01 DIAGNOSIS — Z79899 Other long term (current) drug therapy: Secondary | ICD-10-CM | POA: Diagnosis not present

## 2017-01-01 DIAGNOSIS — I2510 Atherosclerotic heart disease of native coronary artery without angina pectoris: Secondary | ICD-10-CM | POA: Diagnosis not present

## 2017-01-01 DIAGNOSIS — J449 Chronic obstructive pulmonary disease, unspecified: Secondary | ICD-10-CM | POA: Insufficient documentation

## 2017-01-01 DIAGNOSIS — I130 Hypertensive heart and chronic kidney disease with heart failure and stage 1 through stage 4 chronic kidney disease, or unspecified chronic kidney disease: Secondary | ICD-10-CM | POA: Diagnosis not present

## 2017-01-01 DIAGNOSIS — Z638 Other specified problems related to primary support group: Secondary | ICD-10-CM

## 2017-01-01 DIAGNOSIS — R69 Illness, unspecified: Secondary | ICD-10-CM | POA: Diagnosis not present

## 2017-01-01 DIAGNOSIS — R062 Wheezing: Secondary | ICD-10-CM | POA: Diagnosis not present

## 2017-01-01 DIAGNOSIS — I252 Old myocardial infarction: Secondary | ICD-10-CM | POA: Diagnosis not present

## 2017-01-01 DIAGNOSIS — I5032 Chronic diastolic (congestive) heart failure: Secondary | ICD-10-CM | POA: Insufficient documentation

## 2017-01-01 LAB — BASIC METABOLIC PANEL
Anion gap: 9 (ref 5–15)
BUN: 25 mg/dL — ABNORMAL HIGH (ref 6–20)
CO2: 34 mmol/L — ABNORMAL HIGH (ref 22–32)
Calcium: 9.2 mg/dL (ref 8.9–10.3)
Chloride: 94 mmol/L — ABNORMAL LOW (ref 101–111)
Creatinine, Ser: 0.94 mg/dL (ref 0.44–1.00)
GFR calc Af Amer: >60 mL/min (ref >60)
GFR calc non Af Amer: 59 mL/min — ABNORMAL LOW (ref >60)
Glucose, Bld: 141 mg/dL — ABNORMAL HIGH (ref 65–99)
Potassium: 3.9 mmol/L (ref 3.5–5.1)
Sodium: 137 mmol/L (ref 135–145)

## 2017-01-01 LAB — CBC WITH DIFFERENTIAL/PLATELET
Basophils Absolute: 0 10*3/uL (ref 0.0–0.1)
Basophils Relative: 0 %
Eosinophils Absolute: 0.2 10*3/uL (ref 0.0–0.7)
Eosinophils Relative: 2 %
HCT: 36.3 % (ref 36.0–46.0)
Hemoglobin: 11 g/dL — ABNORMAL LOW (ref 12.0–15.0)
Lymphocytes Relative: 6 %
Lymphs Abs: 0.7 10*3/uL (ref 0.7–4.0)
MCH: 28.9 pg (ref 26.0–34.0)
MCHC: 30.3 g/dL (ref 30.0–36.0)
MCV: 95.3 fL (ref 78.0–100.0)
Monocytes Absolute: 0.4 10*3/uL (ref 0.1–1.0)
Monocytes Relative: 3 %
Neutro Abs: 10 10*3/uL — ABNORMAL HIGH (ref 1.7–7.7)
Neutrophils Relative %: 89 %
Platelets: 234 10*3/uL (ref 150–400)
RBC: 3.81 MIL/uL — ABNORMAL LOW (ref 3.87–5.11)
RDW: 14.6 % (ref 11.5–15.5)
WBC: 11.2 10*3/uL — ABNORMAL HIGH (ref 4.0–10.5)

## 2017-01-01 LAB — BRAIN NATRIURETIC PEPTIDE: B Natriuretic Peptide: 20 pg/mL (ref 0.0–100.0)

## 2017-01-01 MED ORDER — ALBUTEROL SULFATE (2.5 MG/3ML) 0.083% IN NEBU
5.0000 mg | INHALATION_SOLUTION | Freq: Once | RESPIRATORY_TRACT | Status: AC
  Administered 2017-01-01: 5 mg via RESPIRATORY_TRACT
  Filled 2017-01-01: qty 6

## 2017-01-01 MED ORDER — METHYLPREDNISOLONE SODIUM SUCC 125 MG IJ SOLR
125.0000 mg | Freq: Once | INTRAMUSCULAR | Status: AC
  Administered 2017-01-01: 125 mg via INTRAVENOUS
  Filled 2017-01-01: qty 2

## 2017-01-01 NOTE — ED Notes (Signed)
Meal provided 

## 2017-01-01 NOTE — ED Notes (Signed)
Ems reports they are filing a case with dss regarding pt's living conditions.  Also, pt reports was recently at [facility] for SOB.   Pt has large bruise and swelling to left arm.

## 2017-01-01 NOTE — ED Notes (Signed)
This RN spoke with the pt's son [name]. Pt's son was upset that he was having to come get his mother because he hadn't gotten groceries yet. Pt talking about his "ex-girlfriend" cursing at his mother and that she would be right back up here due to her cursing at the pt. Pt's son is available to pick the pt up and will be bringing her o2 tank.

## 2017-01-01 NOTE — ED Triage Notes (Signed)
EMS reports sob since last night.  Reports worsened by some anxiety from family members arguing in her house.   EMS administered duoneb.  Pt on 3liters continuously at home.  EMS also gave 125mg  solumedrol IV.  EMS reports pt has the start of some bedsores on her back.  Reports pt has a son that is supposed to help her with her meds.  EMS says she noticed a lot of her medications haven't been given  to her.  Pt says her son had been gone for a while.  Also says she has someone that is supposed to come in everyday to help her clean and bathe but pt says she doesn't remember when she had a bath last.    Pt denies pain.

## 2017-01-01 NOTE — ED Notes (Addendum)
Pt reports that she was recently in [place] reports her care was not as she expected  That she had to ask for pos, TV channel changes etc She reports that she lives with her son - and others No one smokes in the residence  Per EMS residence is uncared for -  Pt has had no bath in awhile- has skin issues to her back Appears anxious  Her L arm is discolored and swollen ? From IV at [facility]

## 2017-01-01 NOTE — ED Notes (Signed)
Pt request food and drink   She reports that she is followed by a case worker at [facility], but that he has been out sick and she cannot speak to him regarding her home situation

## 2017-01-01 NOTE — ED Notes (Signed)
To Radiology

## 2017-01-02 NOTE — ED Provider Notes (Signed)
[facility] EMERGENCY DEPARTMENT Provider Note   CSN: [id number] Arrival date & time: 01/01/17  1459     History   Chief Complaint Chief Complaint  Patient presents with  . Shortness of Breath    HPI Sheila Pierce is a 71 y.o. female.  HPI   71yF with dyspnea. Pt primarily complaining of her home environment. Apparently very chaotic. Children and their guests fighting/arguing. Requesting food and tv be turned on. Does says she feels sob, but chronic. Denies acute pain. No fever. No unusual swelling.   Past Medical History:  Diagnosis Date  . Allergy   . Anemia   . Anxiety   . Arthritis   . Asthma   . Cataract   . Chronic diastolic CHF (congestive heart failure) (HCC) 10/30/2016  . Chronic kidney disease   . COPD (chronic obstructive pulmonary disease) (HCC)    Chronic resp failure on home O2  . Depression   . Diabetes mellitus without complication (HCC)   . Emphysema of lung (HCC)   . GERD (gastroesophageal reflux disease)   . Hypercholesteremia 12/16/2015  . Hyperglycemia    Noted 09/2011 admission in setting of steroid use with normal HgbA1C.  Marland Kitchen Hypertension   . Hypokalemia   . Hyponatremia    Noted 09/2011 admission.  . NSTEMI (non-ST elevated myocardial infarction) (HCC)    a. 09/2011 in setting of acute on chronic resp failure/COPD exacerbation --> cath demonstrated nonobstructive CAD 10/05/11 with EF 50%;  08/2012 elevated Ti in setting of COPD flare -->Echo: EF 60-65%, Gr 1 DD -->Med Rx.  . Osteoporosis   . Oxygen deficiency   . QT prolongation    Noted on EKG 09/2011 (590 in setting of K of 3, improved to 475 by discharge)  . Tobacco abuse 09/19/2012  . Trigeminal neuralgia     Patient Active Problem List   Diagnosis Date Noted  . Acute on chronic diastolic CHF (congestive heart failure) (HCC) 12/19/2016  . Acute on chronic respiratory failure with hypoxia and hypercapnia (HCC) 12/17/2016  . CKD (chronic kidney disease), stage III (HCC) 12/17/2016  . Type  2 diabetes mellitus with stage 3 chronic kidney disease (HCC) 12/17/2016  . Acute respiratory failure (HCC) 12/16/2016  . GERD (gastroesophageal reflux disease) 12/16/2016  . Chronic diastolic CHF (congestive heart failure) (HCC) 10/30/2016  . CKD (chronic kidney disease), stage II 10/30/2016  . Pressure injury of skin 09/15/2016  . COPD (chronic obstructive pulmonary disease) (HCC) 12/19/2015  . Acute on chronic respiratory failure with hypoxia (HCC) 12/19/2015  . Hypercholesteremia 12/16/2015  . Osteoporosis 12/01/2015  . Colonoscopy refused 12/01/2015  . Macular degeneration, age related, nonexudative 11/26/2015  . Cataract incipient, senile, bilateral 11/26/2015  . Onychomycosis of toenail 11/11/2015  . Aortic atherosclerosis (HCC) 11/11/2015  . Abnormal thyroid function test 11/06/2015  . Diabetes mellitus, stable (HCC) 11/05/2015  . COPD exacerbation (HCC) 11/04/2015  . AKI (acute kidney injury) (HCC) 11/03/2015  . Anemia 11/03/2015  . Anxiety 11/03/2015  . Acute exacerbation of chronic obstructive pulmonary disease (COPD) (HCC) 09/19/2012  . CAD (coronary artery disease) 11/24/2011  . Essential hypertension   . MI, acute, non ST segment elevation (HCC) 10/05/2011    Past Surgical History:  Procedure Laterality Date  . ABDOMINAL HYSTERECTOMY     bleeding  . CHOLECYSTECTOMY    . LEFT HEART CATHETERIZATION WITH CORONARY ANGIOGRAM N/A 10/05/2011   Procedure: LEFT HEART CATHETERIZATION WITH CORONARY ANGIOGRAM;  Surgeon: Tonny Bollman, MD;  Location: [facility] CATH LAB;  Service: Cardiovascular;  Laterality: N/A;  . PLANTAR'S WART EXCISION      OB History    No data available       Home Medications    Prior to Admission medications   Medication Sig Start Date End Date Taking? Authorizing Provider  aspirin EC 81 MG tablet Take 81 mg by mouth daily.    [provider]  BREO ELLIPTA 100-25 MCG/INH AEPB Inhale 1 puff into the lungs daily. 11/11/15   [name],  MD  busPIRone (BUSPAR) 15 MG tablet TAKE 1 TABLET BY MOUTH TWICE DAILY 05/31/16   [name], MD  cholecalciferol (VITAMIN D) 1000 units tablet Take 1,000 Units by mouth daily.    [provider]  furosemide (LASIX) 20 MG tablet Take 1 tablet (20 mg total) by mouth daily. 12/22/16   Catarina Hartshorn, MD  ipratropium-albuterol (DUONEB) 0.5-2.5 (3) MG/3ML SOLN Take 3 mLs by nebulization every 4 (four) hours. 11/11/15   [name], MD  metoprolol tartrate (LOPRESSOR) 25 MG tablet TAKE 1 TABLET BY MOUTH TWICE DAILY 05/31/16   [name], MD  Multiple Vitamin (MULTIVITAMIN WITH MINERALS) TABS tablet Take 1 tablet by mouth daily.    [provider]  OXYGEN Inhale 3 L into the lungs continuous.     [provider]  potassium chloride (K-DUR) 10 MEQ tablet Take 1 tablet (10 mEq total) by mouth daily. 03/25/16   [name], MD  predniSONE (DELTASONE) 10 MG tablet Take 6 tablets (60 mg total) by mouth daily with breakfast. And decrease by 1 tablet every 2 days 12/22/16   Tat, Onalee Hua, MD  tiotropium (SPIRIVA) 18 MCG inhalation capsule Place 18 mcg into inhaler and inhale daily.    [provider]  traMADol (ULTRAM) 50 MG tablet Take 1 tablet (50 mg total) by mouth every 6 (six) hours as needed for moderate pain or severe pain. 11/25/16   [name], MD  VENTOLIN HFA 108 [phone] Base) MCG/ACT inhaler INHALE 2 PUFFS EVERY FOUR HOURS AS NEEDED FOR WHEEZING OR SHORTNESS OF BREATH 10/07/16   [name], MD    Family History Family History  Problem Relation Age of Onset  . Cancer Father        Lung  . Cancer Mother        Liver  . Hypertension Son   . Hyperlipidemia Son   . Post-traumatic stress disorder Son   . Depression Son   . Kidney disease Brother        liver and kidney failure  . Hypertension Son   . Hyperlipidemia Son   . Cancer Other        colon  . Diabetes Son   . Lung disease Daughter        passed away at 82 months old     Social History Social History   Tobacco Use  . Smoking status: Former Smoker    Packs/day: 1.00    Years: 40.00    Pack years: 40.00    Types: Cigarettes    Start date: 01/26/1964    Last attempt to quit: 10/2014    Years since quitting: 2.1  . Smokeless tobacco: Never Used  Substance Use Topics  . Alcohol use: No    Comment: occasional  . Drug use: No     Allergies   Penicillins; Sulfa antibiotics; Codeine; Hydrocodone; and Levaquin [levofloxacin]   Review of Systems Review of Systems  All systems reviewed and negative, other than as noted  in HPI.  Physical Exam Updated Vital Signs BP (!) 152/81 (BP Location: Right Arm)   Pulse (!) 118   Temp 98.6 F (37 C) (Oral)   Resp 19   Ht 5' (1.524 m)   Wt 67.6 kg (149 lb)   SpO2 97%   BMI 29.10 kg/m   Physical Exam  Constitutional: She appears well-developed and well-nourished. No distress.  HENT:  Head: Normocephalic and atraumatic.  Eyes: Conjunctivae are normal. Right eye exhibits no discharge. Left eye exhibits no discharge.  Neck: Neck supple.  Cardiovascular: Normal rate, regular rhythm and normal heart sounds. Exam reveals no gallop and no friction rub.  No murmur heard. Pulmonary/Chest: Effort normal. No respiratory distress. She has wheezes.  Occasional wheeze  Abdominal: Soft. She exhibits no distension. There is no tenderness.  Musculoskeletal: She exhibits no edema or tenderness.  Neurological: She is alert.  Skin: Skin is warm and dry.  Psychiatric: She has a normal mood and affect. Her behavior is normal. Thought content normal.  Nursing note and vitals reviewed.    ED Treatments / Results  Labs (all labs ordered are listed, but only abnormal results are displayed) Labs Reviewed  CBC WITH DIFFERENTIAL/PLATELET - Abnormal; Notable for the following components:      Result Value   WBC 11.2 (*)    RBC 3.81 (*)    Hemoglobin 11.0 (*)    Neutro Abs 10.0 (*)    All other components within  normal limits  BASIC METABOLIC PANEL - Abnormal; Notable for the following components:   Chloride 94 (*)    CO2 34 (*)    Glucose, Bld 141 (*)    BUN 25 (*)    GFR calc non Af Amer 59 (*)    All other components within normal limits  BRAIN NATRIURETIC PEPTIDE    EKG  EKG Interpretation  Date/Time:  Saturday January 01 2017 15:14:30 EST Ventricular Rate:  114 PR Interval:    QRS Duration: 82 QT Interval:  324 QTC Calculation: 447 R Axis:   80 Text Interpretation:  Sinus tachycardia since last tracing no significant change Confirmed by Mancel BaleWentz, Elliott [phone]([id number]) on 01/01/2017 3:19:08 PM       Radiology Dg Chest 2 View  Result Date: 01/01/2017 CLINICAL DATA:  Dyspnea worse today, on oxygen, history COPD, diabetes mellitus, asthma, hypertension, NSTEMI EXAM: CHEST  2 VIEW COMPARISON:  12/22/2016 FINDINGS: Normal heart size, mediastinal contours, and pulmonary vascularity. Atherosclerotic calcification aorta. Emphysematous and minimal bronchitic changes consistent with COPD. Accentuation of interstitial markings in the mid to lower lungs with bibasilar atelectasis. No definite acute infiltrate, pleural effusion or pneumothorax. Bones diffusely demineralized. IMPRESSION: COPD changes with chronic interstitial disease and bibasilar atelectasis. Electronically Signed   By: Ulyses SouthwardMark  Boles M.D.   On: 01/01/2017 16:04    Procedures Procedures (including critical care time)  Medications Ordered in ED Medications  methylPREDNISolone sodium succinate (SOLU-MEDROL) 125 mg/2 mL injection 125 mg (125 mg Intravenous Given 01/01/17 1659)  albuterol (PROVENTIL) (2.5 MG/3ML) 0.083% nebulizer solution 5 mg (5 mg Nebulization Given 01/01/17 1601)     Initial Impression / Assessment and Plan / ED Course  I have reviewed the triage vital signs and the nursing notes.  Pertinent labs & imaging results that were available during my care of the patient were reviewed by me and considered in my medical decision  making (see chart for details).     71yF with what sounds more like family/social problems than anything else. Does endorse dyspnea,  but chronic. I doubt emergent process.   Final Clinical Impressions(s) / ED Diagnoses   Final diagnoses:  Dyspnea, unspecified type  Family conflict    ED Discharge Orders    None       Raeford Razor, MD 01/03/17 [phone]

## 2017-01-04 ENCOUNTER — Telehealth: Payer: Self-pay

## 2017-01-04 NOTE — Telephone Encounter (Signed)
Attempted to call pts SW Johnny to discuss pt missing appt.

## 2017-01-09 DIAGNOSIS — R0603 Acute respiratory distress: Secondary | ICD-10-CM | POA: Diagnosis not present

## 2017-01-09 DIAGNOSIS — E878 Other disorders of electrolyte and fluid balance, not elsewhere classified: Secondary | ICD-10-CM | POA: Diagnosis not present

## 2017-01-09 DIAGNOSIS — E871 Hypo-osmolality and hyponatremia: Secondary | ICD-10-CM | POA: Diagnosis not present

## 2017-01-09 DIAGNOSIS — Z7982 Long term (current) use of aspirin: Secondary | ICD-10-CM | POA: Diagnosis not present

## 2017-01-09 DIAGNOSIS — E872 Acidosis: Secondary | ICD-10-CM | POA: Diagnosis not present

## 2017-01-09 DIAGNOSIS — J9611 Chronic respiratory failure with hypoxia: Secondary | ICD-10-CM | POA: Diagnosis not present

## 2017-01-09 DIAGNOSIS — I10 Essential (primary) hypertension: Secondary | ICD-10-CM | POA: Diagnosis not present

## 2017-01-09 DIAGNOSIS — J9612 Chronic respiratory failure with hypercapnia: Secondary | ICD-10-CM | POA: Diagnosis not present

## 2017-01-09 DIAGNOSIS — R0602 Shortness of breath: Secondary | ICD-10-CM | POA: Diagnosis not present

## 2017-01-09 DIAGNOSIS — J441 Chronic obstructive pulmonary disease with (acute) exacerbation: Secondary | ICD-10-CM | POA: Diagnosis not present

## 2017-01-09 DIAGNOSIS — Z87891 Personal history of nicotine dependence: Secondary | ICD-10-CM | POA: Diagnosis not present

## 2017-01-09 DIAGNOSIS — R0682 Tachypnea, not elsewhere classified: Secondary | ICD-10-CM | POA: Diagnosis not present

## 2017-01-09 DIAGNOSIS — Z9981 Dependence on supplemental oxygen: Secondary | ICD-10-CM | POA: Diagnosis not present

## 2017-01-10 ENCOUNTER — Ambulatory Visit: Payer: Medicare HMO | Admitting: Podiatry

## 2017-01-10 DIAGNOSIS — J9610 Chronic respiratory failure, unspecified whether with hypoxia or hypercapnia: Secondary | ICD-10-CM | POA: Diagnosis not present

## 2017-01-10 DIAGNOSIS — J441 Chronic obstructive pulmonary disease with (acute) exacerbation: Secondary | ICD-10-CM | POA: Diagnosis not present

## 2017-01-10 DIAGNOSIS — J449 Chronic obstructive pulmonary disease, unspecified: Secondary | ICD-10-CM | POA: Diagnosis not present

## 2017-01-10 DIAGNOSIS — E871 Hypo-osmolality and hyponatremia: Secondary | ICD-10-CM | POA: Diagnosis not present

## 2017-01-10 DIAGNOSIS — R0602 Shortness of breath: Secondary | ICD-10-CM | POA: Diagnosis not present

## 2017-01-10 DIAGNOSIS — J9811 Atelectasis: Secondary | ICD-10-CM | POA: Diagnosis not present

## 2017-01-10 DIAGNOSIS — E878 Other disorders of electrolyte and fluid balance, not elsewhere classified: Secondary | ICD-10-CM | POA: Diagnosis not present

## 2017-01-11 DIAGNOSIS — E872 Acidosis: Secondary | ICD-10-CM | POA: Diagnosis not present

## 2017-01-11 DIAGNOSIS — J441 Chronic obstructive pulmonary disease with (acute) exacerbation: Secondary | ICD-10-CM | POA: Diagnosis not present

## 2017-01-11 DIAGNOSIS — J9600 Acute respiratory failure, unspecified whether with hypoxia or hypercapnia: Secondary | ICD-10-CM | POA: Diagnosis not present

## 2017-01-12 DIAGNOSIS — J441 Chronic obstructive pulmonary disease with (acute) exacerbation: Secondary | ICD-10-CM | POA: Diagnosis not present

## 2017-01-12 DIAGNOSIS — E871 Hypo-osmolality and hyponatremia: Secondary | ICD-10-CM | POA: Diagnosis not present

## 2017-01-13 DIAGNOSIS — E871 Hypo-osmolality and hyponatremia: Secondary | ICD-10-CM | POA: Diagnosis not present

## 2017-01-13 DIAGNOSIS — J9620 Acute and chronic respiratory failure, unspecified whether with hypoxia or hypercapnia: Secondary | ICD-10-CM | POA: Diagnosis not present

## 2017-01-13 DIAGNOSIS — J441 Chronic obstructive pulmonary disease with (acute) exacerbation: Secondary | ICD-10-CM | POA: Diagnosis not present

## 2017-01-14 DIAGNOSIS — J9620 Acute and chronic respiratory failure, unspecified whether with hypoxia or hypercapnia: Secondary | ICD-10-CM | POA: Diagnosis not present

## 2017-01-14 DIAGNOSIS — J441 Chronic obstructive pulmonary disease with (acute) exacerbation: Secondary | ICD-10-CM | POA: Diagnosis not present

## 2017-01-14 DIAGNOSIS — I959 Hypotension, unspecified: Secondary | ICD-10-CM | POA: Diagnosis not present

## 2017-01-15 DIAGNOSIS — J9612 Chronic respiratory failure with hypercapnia: Secondary | ICD-10-CM | POA: Diagnosis not present

## 2017-01-15 DIAGNOSIS — I10 Essential (primary) hypertension: Secondary | ICD-10-CM | POA: Diagnosis not present

## 2017-01-15 DIAGNOSIS — Z9981 Dependence on supplemental oxygen: Secondary | ICD-10-CM | POA: Diagnosis not present

## 2017-01-15 DIAGNOSIS — Z87891 Personal history of nicotine dependence: Secondary | ICD-10-CM | POA: Diagnosis not present

## 2017-01-15 DIAGNOSIS — J441 Chronic obstructive pulmonary disease with (acute) exacerbation: Secondary | ICD-10-CM | POA: Diagnosis not present

## 2017-01-15 DIAGNOSIS — E878 Other disorders of electrolyte and fluid balance, not elsewhere classified: Secondary | ICD-10-CM | POA: Diagnosis not present

## 2017-01-15 DIAGNOSIS — J9600 Acute respiratory failure, unspecified whether with hypoxia or hypercapnia: Secondary | ICD-10-CM | POA: Diagnosis not present

## 2017-01-15 DIAGNOSIS — J9611 Chronic respiratory failure with hypoxia: Secondary | ICD-10-CM | POA: Diagnosis not present

## 2017-01-15 DIAGNOSIS — E871 Hypo-osmolality and hyponatremia: Secondary | ICD-10-CM | POA: Diagnosis not present

## 2017-01-15 DIAGNOSIS — E872 Acidosis: Secondary | ICD-10-CM | POA: Diagnosis not present

## 2017-01-15 DIAGNOSIS — Z7982 Long term (current) use of aspirin: Secondary | ICD-10-CM | POA: Diagnosis not present

## 2017-01-19 ENCOUNTER — Other Ambulatory Visit: Payer: Self-pay

## 2017-01-19 ENCOUNTER — Encounter (HOSPITAL_COMMUNITY): Payer: Self-pay | Admitting: *Deleted

## 2017-01-19 ENCOUNTER — Inpatient Hospital Stay (HOSPITAL_COMMUNITY)
Admission: EM | Admit: 2017-01-19 | Discharge: 2017-02-05 | DRG: 871 | Disposition: A | Discharge status: Home Health | Payer: Medicare HMO | Attending: Internal Medicine | Admitting: Internal Medicine

## 2017-01-19 ENCOUNTER — Emergency Department (HOSPITAL_COMMUNITY): Payer: Medicare HMO

## 2017-01-19 DIAGNOSIS — J111 Influenza due to unidentified influenza virus with other respiratory manifestations: Secondary | ICD-10-CM | POA: Diagnosis not present

## 2017-01-19 DIAGNOSIS — I2583 Coronary atherosclerosis due to lipid rich plaque: Secondary | ICD-10-CM | POA: Diagnosis not present

## 2017-01-19 DIAGNOSIS — Z7982 Long term (current) use of aspirin: Secondary | ICD-10-CM

## 2017-01-19 DIAGNOSIS — Z800 Family history of malignant neoplasm of digestive organs: Secondary | ICD-10-CM

## 2017-01-19 DIAGNOSIS — Z87891 Personal history of nicotine dependence: Secondary | ICD-10-CM

## 2017-01-19 DIAGNOSIS — Z7952 Long term (current) use of systemic steroids: Secondary | ICD-10-CM

## 2017-01-19 DIAGNOSIS — Z885 Allergy status to narcotic agent status: Secondary | ICD-10-CM | POA: Diagnosis not present

## 2017-01-19 DIAGNOSIS — Z7951 Long term (current) use of inhaled steroids: Secondary | ICD-10-CM

## 2017-01-19 DIAGNOSIS — R0603 Acute respiratory distress: Secondary | ICD-10-CM

## 2017-01-19 DIAGNOSIS — Z66 Do not resuscitate: Secondary | ICD-10-CM | POA: Diagnosis present

## 2017-01-19 DIAGNOSIS — I252 Old myocardial infarction: Secondary | ICD-10-CM

## 2017-01-19 DIAGNOSIS — Z8249 Family history of ischemic heart disease and other diseases of the circulatory system: Secondary | ICD-10-CM

## 2017-01-19 DIAGNOSIS — J9602 Acute respiratory failure with hypercapnia: Secondary | ICD-10-CM

## 2017-01-19 DIAGNOSIS — E861 Hypovolemia: Secondary | ICD-10-CM | POA: Diagnosis present

## 2017-01-19 DIAGNOSIS — Z90710 Acquired absence of both cervix and uterus: Secondary | ICD-10-CM | POA: Diagnosis not present

## 2017-01-19 DIAGNOSIS — J9622 Acute and chronic respiratory failure with hypercapnia: Secondary | ICD-10-CM | POA: Diagnosis present

## 2017-01-19 DIAGNOSIS — E1122 Type 2 diabetes mellitus with diabetic chronic kidney disease: Secondary | ICD-10-CM | POA: Diagnosis present

## 2017-01-19 DIAGNOSIS — J1001 Influenza due to other identified influenza virus with the same other identified influenza virus pneumonia: Secondary | ICD-10-CM | POA: Diagnosis not present

## 2017-01-19 DIAGNOSIS — Z833 Family history of diabetes mellitus: Secondary | ICD-10-CM

## 2017-01-19 DIAGNOSIS — J441 Chronic obstructive pulmonary disease with (acute) exacerbation: Secondary | ICD-10-CM | POA: Diagnosis present

## 2017-01-19 DIAGNOSIS — R0602 Shortness of breath: Secondary | ICD-10-CM

## 2017-01-19 DIAGNOSIS — I959 Hypotension, unspecified: Secondary | ICD-10-CM | POA: Diagnosis not present

## 2017-01-19 DIAGNOSIS — J440 Chronic obstructive pulmonary disease with acute lower respiratory infection: Secondary | ICD-10-CM | POA: Diagnosis not present

## 2017-01-19 DIAGNOSIS — I509 Heart failure, unspecified: Secondary | ICD-10-CM | POA: Diagnosis not present

## 2017-01-19 DIAGNOSIS — Z9981 Dependence on supplemental oxygen: Secondary | ICD-10-CM | POA: Diagnosis not present

## 2017-01-19 DIAGNOSIS — A4189 Other specified sepsis: Secondary | ICD-10-CM | POA: Diagnosis not present

## 2017-01-19 DIAGNOSIS — A419 Sepsis, unspecified organism: Secondary | ICD-10-CM | POA: Diagnosis not present

## 2017-01-19 DIAGNOSIS — R69 Illness, unspecified: Secondary | ICD-10-CM | POA: Diagnosis not present

## 2017-01-19 DIAGNOSIS — I428 Other cardiomyopathies: Secondary | ICD-10-CM | POA: Diagnosis present

## 2017-01-19 DIAGNOSIS — N183 Chronic kidney disease, stage 3 (moderate): Secondary | ICD-10-CM | POA: Diagnosis present

## 2017-01-19 DIAGNOSIS — Z880 Allergy status to penicillin: Secondary | ICD-10-CM | POA: Diagnosis not present

## 2017-01-19 DIAGNOSIS — I5032 Chronic diastolic (congestive) heart failure: Secondary | ICD-10-CM | POA: Diagnosis present

## 2017-01-19 DIAGNOSIS — Z801 Family history of malignant neoplasm of trachea, bronchus and lung: Secondary | ICD-10-CM

## 2017-01-19 DIAGNOSIS — Z9114 Patient's other noncompliance with medication regimen: Secondary | ICD-10-CM

## 2017-01-19 DIAGNOSIS — L89152 Pressure ulcer of sacral region, stage 2: Secondary | ICD-10-CM | POA: Diagnosis not present

## 2017-01-19 DIAGNOSIS — J9621 Acute and chronic respiratory failure with hypoxia: Secondary | ICD-10-CM | POA: Diagnosis present

## 2017-01-19 DIAGNOSIS — J438 Other emphysema: Secondary | ICD-10-CM | POA: Diagnosis not present

## 2017-01-19 DIAGNOSIS — J9601 Acute respiratory failure with hypoxia: Secondary | ICD-10-CM

## 2017-01-19 DIAGNOSIS — M818 Other osteoporosis without current pathological fracture: Secondary | ICD-10-CM | POA: Diagnosis not present

## 2017-01-19 DIAGNOSIS — E872 Acidosis: Secondary | ICD-10-CM | POA: Diagnosis not present

## 2017-01-19 DIAGNOSIS — F419 Anxiety disorder, unspecified: Secondary | ICD-10-CM | POA: Diagnosis present

## 2017-01-19 DIAGNOSIS — Z882 Allergy status to sulfonamides status: Secondary | ICD-10-CM

## 2017-01-19 DIAGNOSIS — J431 Panlobular emphysema: Secondary | ICD-10-CM | POA: Diagnosis not present

## 2017-01-19 DIAGNOSIS — I130 Hypertensive heart and chronic kidney disease with heart failure and stage 1 through stage 4 chronic kidney disease, or unspecified chronic kidney disease: Secondary | ICD-10-CM | POA: Diagnosis not present

## 2017-01-19 DIAGNOSIS — N189 Chronic kidney disease, unspecified: Secondary | ICD-10-CM | POA: Diagnosis present

## 2017-01-19 DIAGNOSIS — I2510 Atherosclerotic heart disease of native coronary artery without angina pectoris: Secondary | ICD-10-CM | POA: Diagnosis present

## 2017-01-19 DIAGNOSIS — J101 Influenza due to other identified influenza virus with other respiratory manifestations: Secondary | ICD-10-CM | POA: Diagnosis not present

## 2017-01-19 DIAGNOSIS — J449 Chronic obstructive pulmonary disease, unspecified: Secondary | ICD-10-CM | POA: Diagnosis not present

## 2017-01-19 DIAGNOSIS — R069 Unspecified abnormalities of breathing: Secondary | ICD-10-CM | POA: Diagnosis not present

## 2017-01-19 DIAGNOSIS — Z881 Allergy status to other antibiotic agents status: Secondary | ICD-10-CM | POA: Diagnosis not present

## 2017-01-19 DIAGNOSIS — J09X1 Influenza due to identified novel influenza A virus with pneumonia: Secondary | ICD-10-CM | POA: Diagnosis not present

## 2017-01-19 DIAGNOSIS — E440 Moderate protein-calorie malnutrition: Secondary | ICD-10-CM | POA: Diagnosis not present

## 2017-01-19 DIAGNOSIS — Z841 Family history of disorders of kidney and ureter: Secondary | ICD-10-CM

## 2017-01-19 DIAGNOSIS — R32 Unspecified urinary incontinence: Secondary | ICD-10-CM | POA: Diagnosis not present

## 2017-01-19 DIAGNOSIS — Z818 Family history of other mental and behavioral disorders: Secondary | ICD-10-CM

## 2017-01-19 LAB — CBC WITH DIFFERENTIAL/PLATELET
Basophils Absolute: 0 10*3/uL (ref 0.0–0.1)
Basophils Relative: 0 %
Eosinophils Absolute: 0.2 10*3/uL (ref 0.0–0.7)
Eosinophils Relative: 2 %
HEMATOCRIT: 37.7 % (ref 36.0–46.0)
HEMOGLOBIN: 11.5 g/dL — AB (ref 12.0–15.0)
LYMPHS ABS: 1.4 10*3/uL (ref 0.7–4.0)
LYMPHS PCT: 14 %
MCH: 29.2 pg (ref 26.0–34.0)
MCHC: 30.5 g/dL (ref 30.0–36.0)
MCV: 95.7 fL (ref 78.0–100.0)
MONOS PCT: 7 %
Monocytes Absolute: 0.7 10*3/uL (ref 0.1–1.0)
NEUTROS ABS: 7.5 10*3/uL (ref 1.7–7.7)
NEUTROS PCT: 77 %
Platelets: 212 10*3/uL (ref 150–400)
RBC: 3.94 MIL/uL (ref 3.87–5.11)
RDW: 14.9 % (ref 11.5–15.5)
WBC: 9.8 10*3/uL (ref 4.0–10.5)

## 2017-01-19 LAB — BLOOD GAS, ARTERIAL
Acid-Base Excess: 10.8 mmol/L — ABNORMAL HIGH (ref 0.0–2.0)
BICARBONATE: 30.4 mmol/L — AB (ref 20.0–28.0)
Drawn by: 21310
O2 CONTENT: 3 L/min
O2 Saturation: 96.8 %
PATIENT TEMPERATURE: 37
PCO2 ART: 88.3 mmHg — AB (ref 32.0–48.0)
PO2 ART: 102 mmHg (ref 83.0–108.0)
pH, Arterial: 7.257 — ABNORMAL LOW (ref 7.350–7.450)

## 2017-01-19 LAB — COMPREHENSIVE METABOLIC PANEL
ALK PHOS: 70 U/L (ref 38–126)
ALT: 39 U/L (ref 14–54)
ANION GAP: 10 (ref 5–15)
AST: 15 U/L (ref 15–41)
Albumin: 3.2 g/dL — ABNORMAL LOW (ref 3.5–5.0)
BILIRUBIN TOTAL: 0.7 mg/dL (ref 0.3–1.2)
BUN: 36 mg/dL — ABNORMAL HIGH (ref 6–20)
CALCIUM: 8.9 mg/dL (ref 8.9–10.3)
CO2: 35 mmol/L — ABNORMAL HIGH (ref 22–32)
CREATININE: 0.97 mg/dL (ref 0.44–1.00)
Chloride: 89 mmol/L — ABNORMAL LOW (ref 101–111)
GFR calc non Af Amer: 57 mL/min — ABNORMAL LOW (ref >60)
GLUCOSE: 125 mg/dL — AB (ref 65–99)
Potassium: 3.9 mmol/L (ref 3.5–5.1)
Sodium: 134 mmol/L — ABNORMAL LOW (ref 135–145)
TOTAL PROTEIN: 6.3 g/dL — AB (ref 6.5–8.1)

## 2017-01-19 LAB — TROPONIN I: Troponin I: <0.03 ng/mL (ref <0.03)

## 2017-01-19 MED ORDER — BISACODYL 5 MG PO TBEC
5.0000 mg | DELAYED_RELEASE_TABLET | Freq: Every day | ORAL | Status: DC | PRN
  Administered 2017-01-23 – 2017-02-02 (×5): 5 mg via ORAL
  Filled 2017-01-19 (×5): qty 1

## 2017-01-19 MED ORDER — ACETAMINOPHEN 325 MG PO TABS
650.0000 mg | ORAL_TABLET | Freq: Four times a day (QID) | ORAL | Status: DC | PRN
  Administered 2017-01-21 – 2017-02-01 (×3): 650 mg via ORAL
  Filled 2017-01-19 (×4): qty 2

## 2017-01-19 MED ORDER — METHYLPREDNISOLONE SODIUM SUCC 125 MG IJ SOLR
125.0000 mg | Freq: Once | INTRAMUSCULAR | Status: AC
  Administered 2017-01-19: 125 mg via INTRAVENOUS
  Filled 2017-01-19: qty 2

## 2017-01-19 MED ORDER — SENNOSIDES-DOCUSATE SODIUM 8.6-50 MG PO TABS
1.0000 | ORAL_TABLET | Freq: Every evening | ORAL | Status: DC | PRN
  Administered 2017-01-30: 1 via ORAL
  Filled 2017-01-19 (×2): qty 1

## 2017-01-19 MED ORDER — MAGNESIUM SULFATE 2 GM/50ML IV SOLN
2.0000 g | INTRAVENOUS | Status: AC
  Administered 2017-01-19: 2 g via INTRAVENOUS
  Filled 2017-01-19: qty 50

## 2017-01-19 MED ORDER — ALBUTEROL (5 MG/ML) CONTINUOUS INHALATION SOLN: 10.0000 mg/h | INHALATION_SOLUTION | RESPIRATORY_TRACT | Status: AC

## 2017-01-19 MED ORDER — VITAMIN D 1000 UNITS PO TABS
1000.0000 [IU] | ORAL_TABLET | Freq: Every day | ORAL | Status: DC
  Administered 2017-01-20 – 2017-02-05 (×16): 1000 [IU] via ORAL
  Filled 2017-01-19 (×19): qty 1

## 2017-01-19 MED ORDER — ONDANSETRON HCL 4 MG/2ML IJ SOLN: 4.0000 mg | Freq: Four times a day (QID) | INTRAMUSCULAR | Status: DC | PRN

## 2017-01-19 MED ORDER — FLUTICASONE FUROATE-VILANTEROL 100-25 MCG/INH IN AEPB
1.0000 | INHALATION_SPRAY | Freq: Every day | RESPIRATORY_TRACT | Status: DC
  Administered 2017-01-21 – 2017-01-23 (×3): 1 via RESPIRATORY_TRACT
  Filled 2017-01-19 (×2): qty 28

## 2017-01-19 MED ORDER — SODIUM CHLORIDE 0.9% FLUSH
3.0000 mL | Freq: Two times a day (BID) | INTRAVENOUS | Status: DC
  Administered 2017-01-20 – 2017-02-05 (×26): 3 mL via INTRAVENOUS

## 2017-01-19 MED ORDER — TRAMADOL HCL 50 MG PO TABS
50.0000 mg | ORAL_TABLET | Freq: Four times a day (QID) | ORAL | Status: DC | PRN
  Administered 2017-01-21 – 2017-02-04 (×7): 50 mg via ORAL
  Filled 2017-01-19 (×7): qty 1

## 2017-01-19 MED ORDER — ALBUTEROL (5 MG/ML) CONTINUOUS INHALATION SOLN
10.0000 mg/h | INHALATION_SOLUTION | RESPIRATORY_TRACT | Status: DC
Start: 2017-01-19 — End: 2017-01-19
  Administered 2017-01-19: 10 mg/h via RESPIRATORY_TRACT
  Filled 2017-01-19: qty 20

## 2017-01-19 MED ORDER — ADULT MULTIVITAMIN W/MINERALS CH
1.0000 | ORAL_TABLET | Freq: Every day | ORAL | Status: DC
  Administered 2017-01-20 – 2017-02-05 (×16): 1 via ORAL
  Filled 2017-01-19 (×17): qty 1

## 2017-01-19 MED ORDER — SODIUM CHLORIDE 0.9 % IV SOLN
INTRAVENOUS | Status: AC
  Administered 2017-01-20: 01:00:00 via INTRAVENOUS

## 2017-01-19 MED ORDER — IPRATROPIUM-ALBUTEROL 0.5-2.5 (3) MG/3ML IN SOLN
3.0000 mL | Freq: Four times a day (QID) | RESPIRATORY_TRACT | Status: DC
  Administered 2017-01-20 – 2017-01-24 (×16): 3 mL via RESPIRATORY_TRACT
  Filled 2017-01-19 (×17): qty 3

## 2017-01-19 MED ORDER — ASPIRIN EC 81 MG PO TBEC
81.0000 mg | DELAYED_RELEASE_TABLET | Freq: Every day | ORAL | Status: DC
  Administered 2017-01-20 – 2017-02-05 (×16): 81 mg via ORAL
  Filled 2017-01-19 (×17): qty 1

## 2017-01-19 MED ORDER — ENOXAPARIN SODIUM 40 MG/0.4ML ~~LOC~~ SOLN
40.0000 mg | SUBCUTANEOUS | Status: DC
  Administered 2017-01-20 – 2017-02-04 (×16): 40 mg via SUBCUTANEOUS
  Filled 2017-01-19 (×17): qty 0.4

## 2017-01-19 MED ORDER — ONDANSETRON HCL 4 MG PO TABS
4.0000 mg | ORAL_TABLET | Freq: Four times a day (QID) | ORAL | Status: DC | PRN
  Administered 2017-01-21 – 2017-01-29 (×2): 4 mg via ORAL
  Filled 2017-01-19 (×2): qty 1

## 2017-01-19 MED ORDER — BUSPIRONE HCL 15 MG PO TABS
15.0000 mg | ORAL_TABLET | Freq: Two times a day (BID) | ORAL | Status: DC
  Administered 2017-01-20 – 2017-02-05 (×31): 15 mg via ORAL
  Filled 2017-01-19 (×37): qty 1

## 2017-01-19 MED ORDER — ACETAMINOPHEN 650 MG RE SUPP: 650.0000 mg | Freq: Four times a day (QID) | RECTAL | Status: DC | PRN

## 2017-01-19 MED ORDER — METOPROLOL TARTRATE 25 MG PO TABS
25.0000 mg | ORAL_TABLET | Freq: Two times a day (BID) | ORAL | Status: DC
  Administered 2017-01-20 – 2017-01-23 (×5): 25 mg via ORAL
  Filled 2017-01-19 (×7): qty 1

## 2017-01-19 MED ORDER — METHYLPREDNISOLONE SODIUM SUCC 125 MG IJ SOLR
60.0000 mg | Freq: Three times a day (TID) | INTRAMUSCULAR | Status: DC
  Administered 2017-01-20 – 2017-01-24 (×14): 60 mg via INTRAVENOUS
  Filled 2017-01-19 (×14): qty 2

## 2017-01-19 NOTE — ED Triage Notes (Signed)
Short of breath

## 2017-01-19 NOTE — ED Notes (Addendum)
Critical value ABG: Ph 7.257, pCO2 88.3, bicarb 30.4. Dr. Hyacinth Meeker notified.

## 2017-01-19 NOTE — ED Provider Notes (Signed)
[facility] EMERGENCY DEPARTMENT Provider Note   CSN: [id number] Arrival date & time: 01/19/17  1700     History   Chief Complaint Chief Complaint  Patient presents with  . Shortness of Breath    HPI Sheila Pierce is a 71 y.o. female.  HPI  The patient is a 71 year old female with a known history of chronic diastolic congestive heart failure as well as chronic kidney disease, COPD, diabetes and has had myocardial infarction though she is known to have a nonischemic cardiomyopathy and has had a negative heart catheterization.  She presents to the hospital today with recurrent shortness of breath.  She does report a low level of ongoing shortness of breath which is chronic however she has had increased amounts of wheezing and arrives with pursed lip breathing and the inability to speak in more than 2 or 3 word sentences.  She denies any swelling of the legs or fevers and states that her cough is intermittently productive.  She is not sure if she has had any fevers but has not had any subjective fevers.  There is been no recent injuries trauma immobilization or surgery.  She has been admitted to the hospital about 5 or 6 weeks ago with COPD as well as being seen in the emergency department 3 weeks ago as well.  The patient is known to have a severely impaired functional reserve with regards to her COPD and has followed with Dr. Juanetta Gosling of the pulmonary clinic in town in the past though she has not seen him recently.  Her symptoms have progressed and she has become severely dyspneic prior to arrival prompting her call to the paramedics.  Past Medical History:  Diagnosis Date  . Allergy   . Anemia   . Anxiety   . Arthritis   . Asthma   . Cataract   . Chronic diastolic CHF (congestive heart failure) (HCC) 10/30/2016  . Chronic kidney disease   . COPD (chronic obstructive pulmonary disease) (HCC)    Chronic resp failure on home O2  . Depression   . Diabetes mellitus without  complication (HCC)   . Emphysema of lung (HCC)   . GERD (gastroesophageal reflux disease)   . Hypercholesteremia 12/16/2015  . Hyperglycemia    Noted 09/2011 admission in setting of steroid use with normal HgbA1C.  Marland Kitchen Hypertension   . Hypokalemia   . Hyponatremia    Noted 09/2011 admission.  . NSTEMI (non-ST elevated myocardial infarction) (HCC)    a. 09/2011 in setting of acute on chronic resp failure/COPD exacerbation --> cath demonstrated nonobstructive CAD 10/05/11 with EF 50%;  08/2012 elevated Ti in setting of COPD flare -->Echo: EF 60-65%, Gr 1 DD -->Med Rx.  . Osteoporosis   . Oxygen deficiency   . QT prolongation    Noted on EKG 09/2011 (590 in setting of K of 3, improved to 475 by discharge)  . Tobacco abuse 09/19/2012  . Trigeminal neuralgia     Patient Active Problem List   Diagnosis Date Noted  . Acute on chronic diastolic CHF (congestive heart failure) (HCC) 12/19/2016  . Acute on chronic respiratory failure with hypercapnia (HCC) 12/17/2016  . CKD (chronic kidney disease), stage III (HCC) 12/17/2016  . Type 2 diabetes mellitus with stage 3 chronic kidney disease (HCC) 12/17/2016  . Acute respiratory failure (HCC) 12/16/2016  . GERD (gastroesophageal reflux disease) 12/16/2016  . Chronic diastolic CHF (congestive heart failure) (HCC) 10/30/2016  . CKD (chronic kidney disease), stage II 10/30/2016  .  Pressure injury of skin 09/15/2016  . COPD (chronic obstructive pulmonary disease) (HCC) 12/19/2015  . Acute on chronic respiratory failure with hypoxia (HCC) 12/19/2015  . Hypercholesteremia 12/16/2015  . Osteoporosis 12/01/2015  . Colonoscopy refused 12/01/2015  . Macular degeneration, age related, nonexudative 11/26/2015  . Cataract incipient, senile, bilateral 11/26/2015  . Onychomycosis of toenail 11/11/2015  . Aortic atherosclerosis (HCC) 11/11/2015  . Abnormal thyroid function test 11/06/2015  . Diabetes mellitus, stable (HCC) 11/05/2015  . COPD exacerbation (HCC)  11/04/2015  . AKI (acute kidney injury) (HCC) 11/03/2015  . Anemia 11/03/2015  . Anxiety 11/03/2015  . Acute exacerbation of chronic obstructive pulmonary disease (COPD) (HCC) 09/19/2012  . CAD (coronary artery disease) 11/24/2011  . Essential hypertension   . MI, acute, non ST segment elevation (HCC) 10/05/2011    Past Surgical History:  Procedure Laterality Date  . ABDOMINAL HYSTERECTOMY     bleeding  . CHOLECYSTECTOMY    . LEFT HEART CATHETERIZATION WITH CORONARY ANGIOGRAM N/A 10/05/2011   Procedure: LEFT HEART CATHETERIZATION WITH CORONARY ANGIOGRAM;  Surgeon: Tonny Bollman, MD;  Location[facility] CATH LAB;  Service: Cardiovascular;  Laterality: N/A;  . PLANTAR'S WART EXCISION      OB History    No data available       Home Medications    Prior to Admission medications   Medication Sig Start Date End Date Taking? Authorizing Provider  aspirin EC 81 MG tablet Take 81 mg by mouth daily.    [provider]  BREO ELLIPTA 100-25 MCG/INH AEPB Inhale 1 puff into the lungs daily. 11/11/15   [name], MD  busPIRone (BUSPAR) 15 MG tablet TAKE 1 TABLET BY MOUTH TWICE DAILY 05/31/16   [name], MD  cholecalciferol (VITAMIN D) 1000 units tablet Take 1,000 Units by mouth daily.    [provider]  furosemide (LASIX) 20 MG tablet Take 1 tablet (20 mg total) by mouth daily. 12/22/16   Catarina Hartshorn, MD  ipratropium-albuterol (DUONEB) 0.5-2.5 (3) MG/3ML SOLN Take 3 mLs by nebulization every 4 (four) hours. 11/11/15   [name], MD  metoprolol tartrate (LOPRESSOR) 25 MG tablet TAKE 1 TABLET BY MOUTH TWICE DAILY 05/31/16   [name], MD  Multiple Vitamin (MULTIVITAMIN WITH MINERALS) TABS tablet Take 1 tablet by mouth daily.    [provider]  OXYGEN Inhale 3 L into the lungs continuous.     [provider]  potassium chloride (K-DUR) 10 MEQ tablet Take 1 tablet (10 mEq total) by mouth daily. 03/25/16   [name], MD    predniSONE (DELTASONE) 10 MG tablet Take 6 tablets (60 mg total) by mouth daily with breakfast. And decrease by 1 tablet every 2 days 12/22/16   Tat, Onalee Hua, MD  tiotropium (SPIRIVA) 18 MCG inhalation capsule Place 18 mcg into inhaler and inhale daily.    [provider]  traMADol (ULTRAM) 50 MG tablet Take 1 tablet (50 mg total) by mouth every 6 (six) hours as needed for moderate pain or severe pain. 11/25/16   [name], MD  VENTOLIN HFA 108 [phone] Base) MCG/ACT inhaler INHALE 2 PUFFS EVERY FOUR HOURS AS NEEDED FOR WHEEZING OR SHORTNESS OF BREATH 10/07/16   [name], MD    Family History Family History  Problem Relation Age of Onset  . Cancer Father        Lung  . Cancer Mother        Liver  . Hypertension Son   .  Hyperlipidemia Son   . Post-traumatic stress disorder Son   . Depression Son   . Kidney disease Brother        liver and kidney failure  . Hypertension Son   . Hyperlipidemia Son   . Cancer Other        colon  . Diabetes Son   . Lung disease Daughter        passed away at 738 months old    Social History Social History   Tobacco Use  . Smoking status: Former Smoker    Packs/day: 1.00    Years: 40.00    Pack years: 40.00    Types: Cigarettes    Start date: 01/26/1964    Last attempt to quit: 10/2014    Years since quitting: 2.2  . Smokeless tobacco: Never Used  Substance Use Topics  . Alcohol use: No    Comment: occasional  . Drug use: No     Allergies   Penicillins; Sulfa antibiotics; Codeine; Hydrocodone; and Levaquin [levofloxacin]   Review of Systems Review of Systems  All other systems reviewed and are negative.    Physical Exam Updated Vital Signs BP (!) 120/56   Pulse (!) 106   Temp 98.1 F (36.7 C)   Resp 19   SpO2 98%   Physical Exam  Constitutional: She appears well-developed and well-nourished. She appears ill. She appears distressed.  HENT:  Head: Normocephalic and atraumatic.  Mouth/Throat: Oropharynx  is clear and moist. No oropharyngeal exudate.  Eyes: Conjunctivae and EOM are normal. Pupils are equal, round, and reactive to light. Right eye exhibits no discharge. Left eye exhibits no discharge. No scleral icterus.  Neck: Normal range of motion. Neck supple. No JVD present. No thyromegaly present.  Cardiovascular: Regular rhythm, normal heart sounds and intact distal pulses. Exam reveals no gallop and no friction rub.  No murmur heard. Tachycardic  Pulmonary/Chest: Tachypnea noted. She is in respiratory distress. She has wheezes. She has rales. She exhibits no tenderness.  Pursed lip breathing, prolonged expiratory phase, diffuse decreased air sounds but some mild expiratory wheezing.  Not moving much air, speaking in 2 or 3 word sentences, rales at the bases  Abdominal: Soft. Bowel sounds are normal. She exhibits no distension and no mass. There is no tenderness.  Musculoskeletal: Normal range of motion. She exhibits no edema or tenderness.  Lymphadenopathy:    She has no cervical adenopathy.  Neurological: She is alert. Coordination normal.  Skin: Skin is warm and dry. No rash noted. No erythema.  Psychiatric: She has a normal mood and affect. Her behavior is normal.  Nursing note and vitals reviewed.    ED Treatments / Results  Labs (all labs ordered are listed, but only abnormal results are displayed) Labs Reviewed  CBC WITH DIFFERENTIAL/PLATELET - Abnormal; Notable for the following components:      Result Value   Hemoglobin 11.5 (*)    All other components within normal limits  COMPREHENSIVE METABOLIC PANEL - Abnormal; Notable for the following components:   Sodium 134 (*)    Chloride 89 (*)    CO2 35 (*)    Glucose, Bld 125 (*)    BUN 36 (*)    Total Protein 6.3 (*)    Albumin 3.2 (*)    GFR calc non Af Amer 57 (*)    All other components within normal limits  BLOOD GAS, ARTERIAL - Abnormal; Notable for the following components:   pH, Arterial 7.257 (*)    pCO2  arterial 88.3 (*)    Bicarbonate 30.4 (*)    Acid-Base Excess 10.8 (*)    All other components within normal limits  TROPONIN I    EKG  EKG Interpretation  Date/Time:  Wednesday January 19 2017 17:45:47 EST Ventricular Rate:  110 PR Interval:    QRS Duration: 78 QT Interval:  315 QTC Calculation: 427 R Axis:   84 Text Interpretation:  Sinus tachycardia Consider left atrial enlargement Borderline right axis deviation since last tracing no significant change Confirmed by Eber Hong ([id number]) on 01/19/2017 6:21:34 PM       Radiology Dg Chest 2 View  Result Date: 01/19/2017 CLINICAL DATA:  71 year old female with CHF and COPD and shortness of breath. EXAM: CHEST  2 VIEW COMPARISON:  Chest radiograph dated 01/10/2017 FINDINGS: There is emphysematous changes of the lungs. There are bibasilar linear atelectasis/ scarring. No focal consolidation, pleural effusion, or pneumothorax. The cardiac silhouette is within normal limits. There is coronary vascular calcification as well as atherosclerotic calcification of the aorta. No acute osseous pathology. IMPRESSION: No active cardiopulmonary disease. Electronically Signed   By: Elgie Collard M.D.   On: 01/19/2017 18:08    Procedures .Critical Care Performed by: Eber Hong, MD Authorized by: Eber Hong, MD   Critical care provider statement:    Critical care time (minutes):  35   Critical care time was exclusive of:  Separately billable procedures and treating other patients and teaching time   Critical care was necessary to treat or prevent imminent or life-threatening deterioration of the following conditions:  Respiratory failure   Critical care was time spent personally by me on the following activities:  Blood draw for specimens, ordering and performing treatments and interventions, ordering and review of laboratory studies, ordering and review of radiographic studies, pulse oximetry, re-evaluation of patient's condition,  review of old charts, obtaining history from patient or surrogate, evaluation of patient's response to treatment, discussions with consultants and development of treatment plan with patient or surrogate   (including critical care time)  Medications Ordered in ED Medications  albuterol (PROVENTIL,VENTOLIN) solution continuous neb (0 mg/hr Nebulization Stopped 01/19/17 2011)  albuterol (PROVENTIL,VENTOLIN) solution continuous neb (not administered)  methylPREDNISolone sodium succinate (SOLU-MEDROL) 125 mg/2 mL injection 125 mg (125 mg Intravenous Given 01/19/17 1735)  magnesium sulfate IVPB 2 g 50 mL (0 g Intravenous Stopped 01/19/17 1906)     Initial Impression / Assessment and Plan / ED Course  I have reviewed the triage vital signs and the nursing notes.  Pertinent labs & imaging results that were available during my care of the patient were reviewed by me and considered in my medical decision making (see chart for details).     Patient appears to be in acute respiratory distress which is likely driven by her obstructive pulmonary disease however given rales on exam we will check a chest x-ray to rule out infiltrate or pneumonia or pulmonary edema.  She will be given a nebulized treatment of continuous treatment, magnesium as well as Solu-Medrol.  The patient is in agreement with this plan.  Despite a negative x-ray the patient had ongoing shortness of breath tachycardia and wheezing even with nebulized treatments on a continuous nebulizer.  A second continuous nebulizer was ordered, BiPAP was ordered because an ABG showed a respiratory acidosis with a pH of 7.25, a CO2 of 88.3.  This was discussed with the hospitalist who is agreed to admit the patient to high level of care.  The patient is critically ill  with hypercapnic respiratory failure.  Final Clinical Impressions(s) / ED Diagnoses   Final diagnoses:  Respiratory distress  Acute respiratory failure with hypoxia and hypercapnia  [facility])    ED Discharge Orders    None       Eber Hong, MD 01/19/17 2028

## 2017-01-19 NOTE — H&P (Signed)
History and Physical    Sheila Pierce:[id number] DOB: [birth year] DOA: 01/19/2017  PCP: [name], MD   Patient coming from: Home  Chief Complaint: SOB   HPI: Sheila Pierce is a 71 y.o. female with medical history significant for chronic diastolic CHF, anxiety, and COPD with chronic hypercarbic and hypoxic respiratory failure, now presenting to the emergency department for increased dyspnea.  Patient reports chronic shortness of breath, but notes that this has decreased markedly in recent days point where she is now unable to speak more than 1-2 words without gasping for air.  Denies fevers or chills and denies chest pain or palpitations.  ED Course: Upon arrival to the ED, patient is found to be afebrile, saturating adequately on 3 L/min of supplemental oxygen, tachypneic, tachycardic, and with stable blood pressure.  EKG features a sinus tachycardia with rate 110 and chest x-ray is negative for acute cardiopulmonary disease.  Chemistry panel reveals a slight hyponatremia carbonate of 35.  BUN to creatinine ratio is elevated.  CBC is unremarkable.  ABG reveals pH 7.26, pCO2 88, pO2 102.  Patient was treated with continuous albuterol neb, 2 g IV magnesium, 125 mg IV Solu-Medrol, and started on BiPAP.  She remains hemodynamically stable, remains markedly dyspneic, and will be admitted to the stepdown unit for ongoing evaluation and management of acute exacerbation in COPD.  Review of Systems:  All other systems reviewed and apart from HPI, are negative.  Past Medical History:  Diagnosis Date  . Allergy   . Anemia   . Anxiety   . Arthritis   . Asthma   . Cataract   . Chronic diastolic CHF (congestive heart failure) (HCC) 10/30/2016  . Chronic kidney disease   . COPD (chronic obstructive pulmonary disease) (HCC)    Chronic resp failure on home O2  . Depression   . Diabetes mellitus without complication (HCC)   . Emphysema of lung (HCC)   . GERD (gastroesophageal reflux  disease)   . Hypercholesteremia 12/16/2015  . Hyperglycemia    Noted 09/2011 admission in setting of steroid use with normal HgbA1C.  Marland Kitchen Hypertension   . Hypokalemia   . Hyponatremia    Noted 09/2011 admission.  . NSTEMI (non-ST elevated myocardial infarction) (HCC)    a. 09/2011 in setting of acute on chronic resp failure/COPD exacerbation --> cath demonstrated nonobstructive CAD 10/05/11 with EF 50%;  08/2012 elevated Ti in setting of COPD flare -->Echo: EF 60-65%, Gr 1 DD -->Med Rx.  . Osteoporosis   . Oxygen deficiency   . QT prolongation    Noted on EKG 09/2011 (590 in setting of K of 3, improved to 475 by discharge)  . Tobacco abuse 09/19/2012  . Trigeminal neuralgia     Past Surgical History:  Procedure Laterality Date  . ABDOMINAL HYSTERECTOMY     bleeding  . CHOLECYSTECTOMY    . LEFT HEART CATHETERIZATION WITH CORONARY ANGIOGRAM N/A 10/05/2011   Procedure: LEFT HEART CATHETERIZATION WITH CORONARY ANGIOGRAM;  Surgeon: Tonny Bollman, MD;  Location: [facility] CATH LAB;  Service: Cardiovascular;  Laterality: N/A;  . PLANTAR'S WART EXCISION       reports that she quit smoking about 2 years ago. Her smoking use included cigarettes. She started smoking about 53 years ago. She has a 40.00 pack-year smoking history. she has never used smokeless tobacco. She reports that she does not drink alcohol or use drugs.  Allergies  Allergen Reactions  . Penicillins Swelling and Other (See Comments)  Reaction:  Unspecified swelling reaction Has patient had a PCN reaction causing immediate rash, facial/tongue/throat swelling, SOB or lightheadedness with hypotension: Yes Has patient had a PCN reaction causing severe rash involving mucus membranes or skin necrosis: No Has patient had a PCN reaction that required hospitalization No Has patient had a PCN reaction occurring within the last 10 years: Yes If all of the above answers are "NO", then may proceed with Cephalosporin use.  . Sulfa Antibiotics Hives  and Other (See Comments)    Reaction:  Hallucinations   . Codeine Nausea Only  . Hydrocodone Nausea Only  . Levaquin [Levofloxacin] Itching, Swelling and Rash    Family History  Problem Relation Age of Onset  . Cancer Father        Lung  . Cancer Mother        Liver  . Hypertension Son   . Hyperlipidemia Son   . Post-traumatic stress disorder Son   . Depression Son   . Kidney disease Brother        liver and kidney failure  . Hypertension Son   . Hyperlipidemia Son   . Cancer Other        colon  . Diabetes Son   . Lung disease Daughter        passed away at 7 months old     Prior to Admission medications   Medication Sig Start Date End Date Taking? Authorizing Provider  aspirin EC 81 MG tablet Take 81 mg by mouth daily.    [provider]  BREO ELLIPTA 100-25 MCG/INH AEPB Inhale 1 puff into the lungs daily. 11/11/15   [name], MD  busPIRone (BUSPAR) 15 MG tablet TAKE 1 TABLET BY MOUTH TWICE DAILY 05/31/16   [name], MD  cholecalciferol (VITAMIN D) 1000 units tablet Take 1,000 Units by mouth daily.    [provider]  furosemide (LASIX) 20 MG tablet Take 1 tablet (20 mg total) by mouth daily. 12/22/16   Catarina Hartshorn, MD  ipratropium-albuterol (DUONEB) 0.5-2.5 (3) MG/3ML SOLN Take 3 mLs by nebulization every 4 (four) hours. 11/11/15   [name], MD  metoprolol tartrate (LOPRESSOR) 25 MG tablet TAKE 1 TABLET BY MOUTH TWICE DAILY 05/31/16   [name], MD  Multiple Vitamin (MULTIVITAMIN WITH MINERALS) TABS tablet Take 1 tablet by mouth daily.    [provider]  OXYGEN Inhale 3 L into the lungs continuous.     [provider]  potassium chloride (K-DUR) 10 MEQ tablet Take 1 tablet (10 mEq total) by mouth daily. 03/25/16   [name], MD  predniSONE (DELTASONE) 10 MG tablet Take 6 tablets (60 mg total) by mouth daily with breakfast. And decrease by 1 tablet every 2 days 12/22/16   Tat, Onalee Hua, MD    tiotropium (SPIRIVA) 18 MCG inhalation capsule Place 18 mcg into inhaler and inhale daily.    [provider]  traMADol (ULTRAM) 50 MG tablet Take 1 tablet (50 mg total) by mouth every 6 (six) hours as needed for moderate pain or severe pain. 11/25/16   [name], MD  VENTOLIN HFA 108 [id number] Base) MCG/ACT inhaler INHALE 2 PUFFS EVERY FOUR HOURS AS NEEDED FOR WHEEZING OR SHORTNESS OF BREATH 10/07/16   [name], MD    Physical Exam: Vitals:   01/19/17 1711 01/19/17 1744 01/19/17 1820 01/19/17 1900  BP: (!) 122/53 119/70  (!) 120/56  Pulse: (!) 116 (!) 114  (!) 106  Resp: Marland Kitchen)  24 16  19   Temp: 98.1 F (36.7 C)     SpO2: 97% 99% 97% 98%      Constitutional: Not in acute distress, somnolent, easily roused Eyes: PERTLA, lids and conjunctivae normal ENMT: Mucous membranes are moist. Posterior pharynx clear of any exudate or lesions.   Neck: normal, supple, no masses, no thyromegaly Respiratory: Diminished bilaterally with expiratory wheezes. No pallor.  Cardiovascular: Rate ~110 and regular. No significant JVD. Abdomen: No distension, no tenderness, no masses palpated. Bowel sounds normal.  Musculoskeletal: no clubbing / cyanosis. No joint deformity upper and lower extremities.   Skin: no significant rashes, lesions, ulcers. Warm, dry, well-perfused. Neurologic: CN 2-12 grossly intact. Sensation intact. Strength 5/5 in all 4 limbs.  Psychiatric: Somnolent, easily roused and oriented x 3. Calm, cooperative.     Labs on Admission: I have personally reviewed following labs and imaging studies  CBC: Recent Labs  Lab 01/19/17 1724  WBC 9.8  NEUTROABS 7.5  HGB 11.5*  HCT 37.7  MCV 95.7  PLT 212   Basic Metabolic Panel: Recent Labs  Lab 01/19/17 1724  NA 134*  K 3.9  CL 89*  CO2 35*  GLUCOSE 125*  BUN 36*  CREATININE 0.97  CALCIUM 8.9   GFR: CrCl cannot be calculated (Unknown ideal weight.). Liver Function Tests: Recent Labs  Lab 01/19/17 1724   AST 15  ALT 39  ALKPHOS 70  BILITOT 0.7  PROT 6.3*  ALBUMIN 3.2*   No results for input(s): LIPASE, AMYLASE in the last 168 hours. No results for input(s): AMMONIA in the last 168 hours. Coagulation Profile: No results for input(s): INR, PROTIME in the last 168 hours. Cardiac Enzymes: Recent Labs  Lab 01/19/17 1724  TROPONINI <0.03   BNP (last 3 results) No results for input(s): PROBNP in the last 8760 hours. HbA1C: No results for input(s): HGBA1C in the last 72 hours. CBG: No results for input(s): GLUCAP in the last 168 hours. Lipid Profile: No results for input(s): CHOL, HDL, LDLCALC, TRIG, CHOLHDL, LDLDIRECT in the last 72 hours. Thyroid Function Tests: No results for input(s): TSH, T4TOTAL, FREET4, T3FREE, THYROIDAB in the last 72 hours. Anemia Panel: No results for input(s): VITAMINB12, FOLATE, FERRITIN, TIBC, IRON, RETICCTPCT in the last 72 hours. Urine analysis:    Component Value Date/Time   COLORURINE YELLOW 04/16/2016 1920   APPEARANCEUR HAZY (A) 04/16/2016 1920   LABSPEC 1.006 04/16/2016 1920   PHURINE 6.0 04/16/2016 1920   GLUCOSEU NEGATIVE 04/16/2016 1920   HGBUR SMALL (A) 04/16/2016 1920   BILIRUBINUR neg 11/24/2016 1131   KETONESUR NEGATIVE 04/16/2016 1920   PROTEINUR neg 11/24/2016 1131   PROTEINUR NEGATIVE 04/16/2016 1920   UROBILINOGEN 0.2 11/24/2016 1131   NITRITE neg 11/24/2016 1131   NITRITE POSITIVE (A) 04/16/2016 1920   LEUKOCYTESUR Small (1+) (A) 11/24/2016 1131   Sepsis Labs: @LABRCNTIP (procalcitonin:4,lacticidven:4) )No results found for this or any previous visit (from the past 240 hour(s)).   Radiological Exams on Admission: Dg Chest 2 View  Result Date: 01/19/2017 CLINICAL DATA:  71 year old female with CHF and COPD and shortness of breath. EXAM: CHEST  2 VIEW COMPARISON:  Chest radiograph dated 01/10/2017 FINDINGS: There is emphysematous changes of the lungs. There are bibasilar linear atelectasis/ scarring. No focal  consolidation, pleural effusion, or pneumothorax. The cardiac silhouette is within normal limits. There is coronary vascular calcification as well as atherosclerotic calcification of the aorta. No acute osseous pathology. IMPRESSION: No active cardiopulmonary disease. Electronically Signed   By: Elgie Collard  M.D.   On: 01/19/2017 18:08    EKG: Independently reviewed. Sinus tachycardia (rate 110).   Assessment/Plan  1. COPD exacerbation; acute on chronic hypercarbic respiratory failure  - Pt with severe COPD, presenting with increased and severe SOB  - No fever or leukocytosis, CXR without acute findings  - Treated in ED with CAT, IV Solu-Medrol, magnesium, and BiPAP  - Continue BiPAP as needed, check sputum culture and continue systemic steroid with Solu-Medrol, continue nebs    2. Chronic diastolic CHF  - Appears hypovolemic on admission  - Hold Lasix initially, follow daily wt and I/O's, provide gentle IVF hydration    3. CAD - No anginal complaints  - Continue ASA and Lopressor    4. Anxiety  - Continue Buspar     DVT prophylaxis: Lovenox Code Status: DNR Family Communication: Discussed with patient Disposition Plan: Admit to SDU Consults called: None Admission status: Inpatient    Briscoe Deutscherimothy S Opyd, MD [facility] Pager [phone]  If 7PM-7AM, please contact night-coverage [email or web address] Password [email or web address]  01/19/2017, 8:33 PM

## 2017-01-20 DIAGNOSIS — A419 Sepsis, unspecified organism: Secondary | ICD-10-CM

## 2017-01-20 DIAGNOSIS — I252 Old myocardial infarction: Secondary | ICD-10-CM | POA: Diagnosis not present

## 2017-01-20 DIAGNOSIS — J1001 Influenza due to other identified influenza virus with the same other identified influenza virus pneumonia: Secondary | ICD-10-CM | POA: Diagnosis not present

## 2017-01-20 DIAGNOSIS — A4189 Other specified sepsis: Secondary | ICD-10-CM | POA: Diagnosis not present

## 2017-01-20 DIAGNOSIS — J441 Chronic obstructive pulmonary disease with (acute) exacerbation: Secondary | ICD-10-CM | POA: Diagnosis not present

## 2017-01-20 DIAGNOSIS — J9621 Acute and chronic respiratory failure with hypoxia: Secondary | ICD-10-CM

## 2017-01-20 DIAGNOSIS — I428 Other cardiomyopathies: Secondary | ICD-10-CM | POA: Diagnosis not present

## 2017-01-20 DIAGNOSIS — J9622 Acute and chronic respiratory failure with hypercapnia: Secondary | ICD-10-CM | POA: Diagnosis not present

## 2017-01-20 DIAGNOSIS — I130 Hypertensive heart and chronic kidney disease with heart failure and stage 1 through stage 4 chronic kidney disease, or unspecified chronic kidney disease: Secondary | ICD-10-CM | POA: Diagnosis not present

## 2017-01-20 DIAGNOSIS — Z66 Do not resuscitate: Secondary | ICD-10-CM | POA: Diagnosis not present

## 2017-01-20 DIAGNOSIS — I5032 Chronic diastolic (congestive) heart failure: Secondary | ICD-10-CM | POA: Diagnosis not present

## 2017-01-20 LAB — EXPECTORATED SPUTUM ASSESSMENT W GRAM STAIN, RFLX TO RESP C

## 2017-01-20 LAB — LACTIC ACID, PLASMA
LACTIC ACID, VENOUS: 1.9 mmol/L (ref 0.5–1.9)
LACTIC ACID, VENOUS: 1.2 mmol/L (ref 0.5–1.9)
Lactic Acid, Venous: 2.3 mmol/L (ref 0.5–1.9)

## 2017-01-20 LAB — CBC
HEMATOCRIT: 35.1 % — AB (ref 36.0–46.0)
HEMOGLOBIN: 10.6 g/dL — AB (ref 12.0–15.0)
MCH: 28.5 pg (ref 26.0–34.0)
MCHC: 30.2 g/dL (ref 30.0–36.0)
MCV: 94.4 fL (ref 78.0–100.0)
Platelets: 201 10*3/uL (ref 150–400)
RBC: 3.72 MIL/uL — AB (ref 3.87–5.11)
RDW: 14.8 % (ref 11.5–15.5)
WBC: 10 10*3/uL (ref 4.0–10.5)

## 2017-01-20 LAB — RESPIRATORY PANEL BY PCR
ADENOVIRUS-RVPPCR: NOT DETECTED
Bordetella pertussis: NOT DETECTED
CORONAVIRUS NL63-RVPPCR: NOT DETECTED
CORONAVIRUS OC43-RVPPCR: NOT DETECTED
Chlamydophila pneumoniae: NOT DETECTED
Coronavirus 229E: NOT DETECTED
Coronavirus HKU1: NOT DETECTED
INFLUENZA A H1 2009-RVPPR: DETECTED — AB
Influenza B: NOT DETECTED
MYCOPLASMA PNEUMONIAE-RVPPCR: NOT DETECTED
Metapneumovirus: NOT DETECTED
PARAINFLUENZA VIRUS 1-RVPPCR: NOT DETECTED
PARAINFLUENZA VIRUS 2-RVPPCR: NOT DETECTED
PARAINFLUENZA VIRUS 4-RVPPCR: NOT DETECTED
Parainfluenza Virus 3: NOT DETECTED
Respiratory Syncytial Virus: NOT DETECTED
Rhinovirus / Enterovirus: NOT DETECTED

## 2017-01-20 LAB — BASIC METABOLIC PANEL
ANION GAP: 9 (ref 5–15)
BUN: 35 mg/dL — AB (ref 6–20)
CHLORIDE: 90 mmol/L — AB (ref 101–111)
CO2: 35 mmol/L — ABNORMAL HIGH (ref 22–32)
Calcium: 8.5 mg/dL — ABNORMAL LOW (ref 8.9–10.3)
Creatinine, Ser: 0.99 mg/dL (ref 0.44–1.00)
GFR, EST NON AFRICAN AMERICAN: 56 mL/min — AB (ref >60)
Glucose, Bld: 156 mg/dL — ABNORMAL HIGH (ref 65–99)
POTASSIUM: 4.8 mmol/L (ref 3.5–5.1)
SODIUM: 134 mmol/L — AB (ref 135–145)

## 2017-01-20 LAB — GLUCOSE, CAPILLARY: GLUCOSE-CAPILLARY: 142 mg/dL — AB (ref 65–99)

## 2017-01-20 LAB — MRSA PCR SCREENING: MRSA BY PCR: NEGATIVE

## 2017-01-20 LAB — EXPECTORATED SPUTUM ASSESSMENT W REFEX TO RESP CULTURE

## 2017-01-20 LAB — MAGNESIUM: MAGNESIUM: 2.7 mg/dL — AB (ref 1.7–2.4)

## 2017-01-20 MED ORDER — CHLORHEXIDINE GLUCONATE 0.12 % MT SOLN
15.0000 mL | Freq: Two times a day (BID) | OROMUCOSAL | Status: DC
  Administered 2017-01-20 – 2017-02-05 (×21): 15 mL via OROMUCOSAL
  Filled 2017-01-20 (×28): qty 15

## 2017-01-20 MED ORDER — ORAL CARE MOUTH RINSE
15.0000 mL | Freq: Two times a day (BID) | OROMUCOSAL | Status: DC
  Administered 2017-01-20 – 2017-02-05 (×8): 15 mL via OROMUCOSAL

## 2017-01-20 MED ORDER — DM-GUAIFENESIN ER 30-600 MG PO TB12
1.0000 | ORAL_TABLET | Freq: Two times a day (BID) | ORAL | Status: DC
  Administered 2017-01-20 – 2017-01-24 (×8): 1 via ORAL
  Filled 2017-01-20 (×9): qty 1

## 2017-01-20 MED ORDER — SODIUM CHLORIDE 0.9 % IV SOLN
INTRAVENOUS | Status: DC
  Administered 2017-01-20 – 2017-01-23 (×4): via INTRAVENOUS

## 2017-01-20 MED ORDER — LEVOFLOXACIN IN D5W 750 MG/150ML IV SOLN
750.0000 mg | INTRAVENOUS | Status: DC
  Administered 2017-01-20: 750 mg via INTRAVENOUS
  Filled 2017-01-20 (×2): qty 150

## 2017-01-20 NOTE — Progress Notes (Signed)
PROGRESS NOTE    Sheila Pierce  BJY:[id number] DOB: 10/10/1945 DOA: 01/19/2017 PCP: Eustace Pierce, Sheila Sue, MD   Brief Narrative:  71 y.o. WF PMHx   Anxiety, Depression, NSTEMI, Chronic Diastolic CHF, HTN, QT prolongation, HLD, COPD, Chronic resp failure on home O2 3 L via [place],Tobacco abuse, CKD, Diabetes mellitus without complication,  Anemia, Trigeminal neuralgia  Presenting to the emergency department for increased dyspnea.  Patient reports chronic shortness of breath, but notes that this has decreased markedly in recent days point where she is now unable to speak more than 1-2 words without gasping for air.  Denies fevers or chills and denies chest pain or palpitations.   ED Course: Upon arrival to the ED, patient is found to be afebrile, saturating adequately on 3 L/min of supplemental oxygen, tachypneic, tachycardic, and with stable blood pressure.  EKG features a sinus tachycardia with rate 110 and chest x-ray is negative for acute cardiopulmonary disease.  Chemistry panel reveals a slight hyponatremia carbonate of 35.  BUN to creatinine ratio is elevated.  CBC is unremarkable.  ABG reveals pH 7.26, pCO2 88, pO2 102.  Patient was treated with continuous albuterol neb, 2 g IV magnesium, 125 mg IV Solu-Medrol, and started on BiPAP.  She remains hemodynamically stable, remains markedly dyspneic, and will be admitted to the stepdown unit for ongoing evaluation and management of acute exacerbation in COPD.    Subjective: 12/27 A/O 4, negative CP, positive SOB. Negative abdominal pain, negative N/V. States does not smoke. Has not been around any sick contacts. Has waxing and waning productive cough. States uses her home O2 as prescribed (3 L O2 via [place]).   Assessment & Plan:   Active Problems:   CAD (coronary artery disease)   Anxiety   COPD exacerbation (HCC)   Chronic diastolic CHF (congestive heart failure) (HCC)   Acute on chronic respiratory failure with hypercapnia (HCC)   COPD with  acute exacerbation (HCC)   Sepsis unspecified organism? -Patient meets criteria on admission HR> 90, RR> 20. In addition patient has lactic acidosis 2.3 -Normal saline 5975ml/hr -Start levofloxacin 5 day course   Acute on Chronic Respiratory Failure with Hypoxia and hypercarbia/COPD exacerbation -DuoNeb QID -Solu-Medrol 60 mg TID -Mucinex DM -Flutter valve -Cultures pending see below -BiPAP PRN -Patient still extremely tight (respiratory status tenuous) will consider racemic epi if any deterioration  Chronic Diastolic CHF (base weight?) -EKG from 01/19/2017 compared to EKG 01/01/2017 no significant change. Sinus tachycardia -Strict in and out -Daily weight -Metoprolol 25 mg BID    CAD  -Asymptomatic -ASA 81 mg daily -See CHF  Anxiety -BuSpar 15 mg daily      DVT prophylaxis: Lovenox Code Status: DNR Family Communication: None Disposition Plan: TPD   Consultants:  None   Procedures/Significant Events:  12/17/16 Echocardiogram:Left ventricle: The cavity size was normal. There was mild focal   basal hypertrophy of the septum. Systolic function was vigorous.   The estimated ejection fraction was in the range of 65% to 70%.   Wall motion was normal; there were no regional wall motion   abnormalities. Doppler parameters are consistent with abnormal   left ventricular relaxation (grade 1 diastolic dysfunction). - Atrial septum: There was increased thickness of the septum,   consistent with lipomatous hypertrophy.  -----------------------------------------------------------------------------------------------------------------------------------    I have personally reviewed and interpreted all radiology studies and my findings are as above.  VENTILATOR SETTINGS:    Cultures 12/27 MRSA by PCR negative  Antimicrobials: Anti-infectives (From admission, onward)  None       Devices  LINES / TUBES:      Continuous Infusions: . sodium chloride 75  mL/hr at 01/20/17 0126     Objective: Vitals:   01/20/17 0000 01/20/17 0135 01/20/17 0332 01/20/17 0457  BP: (!) 103/58  (!) 110/55   Pulse: (!) 104     Resp: 19     Temp:   97.7 F (36.5 C)   TempSrc:   Axillary   SpO2: 99% 99%    Weight:    135 lb 12.9 oz (61.6 kg)    Intake/Output Summary (Last 24 hours) at 01/20/2017 8325 Last data filed at 01/20/2017 0600 Gross per 24 hour  Intake 342.5 ml  Output -  Net 342.5 ml   Filed Weights   01/20/17 0457  Weight: 135 lb 12.9 oz (61.6 kg)    Examination:  General: A/O 4, positive acute on chronic respiratory distress Neck:  Negative scars, masses, torticollis, lymphadenopathy, JVD Lungs: extremely poor air movement throughout, diffuse respiratory wheezes, negative crackles  Cardiovascular: Tachycardic, Regular rhythm without murmur gallop or rub normal S1 and S2 Abdomen: Morbidly obese, negative abdominal pain, nondistended, positive soft, bowel sounds, no rebound, no ascites, no appreciable mass Extremities: No significant cyanosis, clubbing, or edema bilateral lower extremities Skin: Negative rashes, lesions, ulcers Psychiatric:  Negative depression, negative anxiety, negative fatigue, negative mania  Central nervous system:  Cranial nerves II through XII intact, tongue/uvula midline, all extremities muscle strength 5/5, sensation intact throughout, negative dysarthria, negative expressive aphasia, negative receptive aphasia.  .     Data Reviewed: Care during the described time interval was provided by me .  I have reviewed this patient's available data, including medical history, events of note, physical examination, and all test results as part of my evaluation.   CBC: Recent Labs  Lab 01/19/17 1724  WBC 9.8  NEUTROABS 7.5  HGB 11.5*  HCT 37.7  MCV 95.7  PLT 212   Basic Metabolic Panel: Recent Labs  Lab 01/19/17 1724  NA 134*  K 3.9  CL 89*  CO2 35*  GLUCOSE 125*  BUN 36*  CREATININE 0.97  CALCIUM  8.9   GFR: Estimated Creatinine Clearance: 43 mL/min (by C-G formula based on SCr of 0.97 mg/dL). Liver Function Tests: Recent Labs  Lab 01/19/17 1724  AST 15  ALT 39  ALKPHOS 70  BILITOT 0.7  PROT 6.3*  ALBUMIN 3.2*   No results for input(s): LIPASE, AMYLASE in the last 168 hours. No results for input(s): AMMONIA in the last 168 hours. Coagulation Profile: No results for input(s): INR, PROTIME in the last 168 hours. Cardiac Enzymes: Recent Labs  Lab 01/19/17 1724  TROPONINI <0.03   BNP (last 3 results) No results for input(s): PROBNP in the last 8760 hours. HbA1C: No results for input(s): HGBA1C in the last 72 hours. CBG: No results for input(s): GLUCAP in the last 168 hours. Lipid Profile: No results for input(s): CHOL, HDL, LDLCALC, TRIG, CHOLHDL, LDLDIRECT in the last 72 hours. Thyroid Function Tests: No results for input(s): TSH, T4TOTAL, FREET4, T3FREE, THYROIDAB in the last 72 hours. Anemia Panel: No results for input(s): VITAMINB12, FOLATE, FERRITIN, TIBC, IRON, RETICCTPCT in the last 72 hours. Urine analysis:    Component Value Date/Time   COLORURINE YELLOW 04/16/2016 1920   APPEARANCEUR HAZY (A) 04/16/2016 1920   LABSPEC 1.006 04/16/2016 1920   PHURINE 6.0 04/16/2016 1920   GLUCOSEU NEGATIVE 04/16/2016 1920   HGBUR SMALL (A) 04/16/2016 1920   BILIRUBINUR  neg 11/24/2016 1131   KETONESUR NEGATIVE 04/16/2016 1920   PROTEINUR neg 11/24/2016 1131   PROTEINUR NEGATIVE 04/16/2016 1920   UROBILINOGEN 0.2 11/24/2016 1131   NITRITE neg 11/24/2016 1131   NITRITE POSITIVE (A) 04/16/2016 1920   LEUKOCYTESUR Small (1+) (A) 11/24/2016 1131   Sepsis Labs: @LABRCNTIP (procalcitonin:4,lacticidven:4)  ) Recent Results (from the past 240 hour(s))  MRSA PCR Screening     Status: None   Collection Time: 01/20/17  1:15 AM  Result Value Ref Range Status   MRSA by PCR NEGATIVE NEGATIVE Final    Comment:        The GeneXpert MRSA Assay (FDA approved for NASAL  specimens only), is one component of a comprehensive MRSA colonization surveillance program. It is not intended to diagnose MRSA infection nor to guide or monitor treatment for MRSA infections.          Radiology Studies: Dg Chest 2 View  Result Date: 01/19/2017 CLINICAL DATA:  71 year old female with CHF and COPD and shortness of breath. EXAM: CHEST  2 VIEW COMPARISON:  Chest radiograph dated 01/10/2017 FINDINGS: There is emphysematous changes of the lungs. There are bibasilar linear atelectasis/ scarring. No focal consolidation, pleural effusion, or pneumothorax. The cardiac silhouette is within normal limits. There is coronary vascular calcification as well as atherosclerotic calcification of the aorta. No acute osseous pathology. IMPRESSION: No active cardiopulmonary disease. Electronically Signed   By: Elgie Collard M.D.   On: 01/19/2017 18:08        Scheduled Meds: . aspirin EC  81 mg Oral Daily  . busPIRone  15 mg Oral BID  . chlorhexidine  15 mL Mouth Rinse BID  . cholecalciferol  1,000 Units Oral Daily  . enoxaparin (LOVENOX) injection  40 mg Subcutaneous Q24H  . fluticasone furoate-vilanterol  1 puff Inhalation Daily  . ipratropium-albuterol  3 mL Nebulization QID  . mouth rinse  15 mL Mouth Rinse q12n4p  . methylPREDNISolone (SOLU-MEDROL) injection  60 mg Intravenous Q8H  . metoprolol tartrate  25 mg Oral BID  . multivitamin with minerals  1 tablet Oral Daily  . sodium chloride flush  3 mL Intravenous Q12H   Continuous Infusions: . sodium chloride 75 mL/hr at 01/20/17 0126     LOS: 1 day    Time spent: 40 minutes    Reyah Streeter, Roselind Messier, MD [facility] Pager [phone]   If 7PM-7AM, please contact night-coverage [email or web address] Password [facility] 01/20/2017, 7:26 AM

## 2017-01-20 NOTE — Evaluation (Signed)
Physical Therapy Evaluation Patient Details Name: Sheila Pierce MRN: [id number] DOB: 10-Jun-1945 Today's Date: 01/20/2017   History of Present Illness  Pt is a 71 y/o female with medical history significant for chronic diastolic CHF, anxiety, and COPD with chronic hypercarbic and hypoxic respiratory failure, now presenting to the emergency department for increased dyspnea secondary to a COPD exacerbation    Clinical Impression  Pt presented supine in bed with HOB elevated, awake and initially willing to participate in therapy session; however, when therapist suggested assisting pt to recliner chair pt became very anxious and refusing to perform any OOB mobility despite max encouragement. Pt only performed bed mobility for repositioning in bed for improved cardiorespiratory function. Pt would continue to benefit from skilled physical therapy services at this time while admitted and after d/c to address the below listed limitations in order to improve overall safety and independence with functional mobility.     Follow Up Recommendations SNF;Supervision/Assistance - 24 hour    Equipment Recommendations  None recommended by PT    Recommendations for Other Services       Precautions / Restrictions Precautions Precautions: Fall Precaution Comments: watch SPO2 Restrictions Weight Bearing Restrictions: No      Mobility  Bed Mobility Overal bed mobility: Needs Assistance Bed Mobility: Rolling Rolling: Min guard         General bed mobility comments: rolling for repositioning in bed; pt very anxious and refusing further mobility at this time despite max encouragement  Transfers                 General transfer comment: pt refusing at this time despite max encouragement  Ambulation/Gait                Stairs            Wheelchair Mobility    Modified Rankin (Stroke Patients Only)       Balance                                              Pertinent Vitals/Pain Pain Assessment: No/denies pain    Home Living Family/patient expects to be discharged to:: Private residence Living Arrangements: Children Available Help at Discharge: Family;Friend(s);Available 24 hours/day Type of Home: House Home Access: Stairs to enter   [employer] of Steps: 1 Home Layout: One level Home Equipment: Wheelchair - manual      Prior Function Level of Independence: Needs assistance   Gait / Transfers Assistance Needed: pt reported that she uses a w/c primarily for mobility  ADL's / Homemaking Assistance Needed: Pt reported that she has an aide that comes every day for ~3 hours a day who assists her with ADLs and IADLs        Hand Dominance        Extremity/Trunk Assessment   Upper Extremity Assessment Upper Extremity Assessment: Defer to OT evaluation    Lower Extremity Assessment Lower Extremity Assessment: Overall WFL for tasks assessed(pt with functional strength in bilateral LEs, overall 3+/5)       Communication   Communication: No difficulties  Cognition Arousal/Alertness: Awake/alert Behavior During Therapy: Anxious Overall Cognitive Status: Within Functional Limits for tasks assessed                                 General  Comments: cognition not formally assessed but [facility] for general conversation      General Comments      Exercises     Assessment/Plan    PT Assessment Patient needs continued PT services  PT Problem List Decreased strength;Decreased activity tolerance;Decreased balance;Decreased mobility;Decreased coordination;Decreased safety awareness;Cardiopulmonary status limiting activity       PT Treatment Interventions DME instruction;Gait training;Stair training;Functional mobility training;Therapeutic activities;Therapeutic exercise;Balance training;Neuromuscular re-education;Patient/family education    PT Goals (Current goals can be found in the Care Plan section)   Acute Rehab PT Goals Patient Stated Goal: to feel better PT Goal Formulation: With patient Time For Goal Achievement: 02/03/17 Potential to Achieve Goals: Fair    Frequency Min 3X/week   Barriers to discharge        Co-evaluation               AM-PAC PT "6 Clicks" Daily Activity  Outcome Measure Difficulty turning over in bed (including adjusting bedclothes, sheets and blankets)?: A Lot Difficulty moving from lying on back to sitting on the side of the bed? : A Lot Difficulty sitting down on and standing up from a chair with arms (e.g., wheelchair, bedside commode, etc,.)?: Unable Help needed moving to and from a bed to chair (including a wheelchair)?: A Lot Help needed walking in hospital room?: A Lot Help needed climbing 3-5 steps with a railing? : Total 6 Click Score: 10    End of Session Equipment Utilized During Treatment: Oxygen Activity Tolerance: Patient limited by fatigue Patient left: in bed;with call bell/phone within reach Nurse Communication: Mobility status PT Visit Diagnosis: Other abnormalities of gait and mobility (R26.89)    Time: [id number] PT Time Calculation (min) (ACUTE ONLY): 16 min   Charges:   PT Evaluation $PT Eval Moderate Complexity: 1 Mod     PT G Codes:        EarlingJennifer Azaya Goedde, PT, DPT [phone]   Alessandra BevelsJennifer M Kristianne Albin 01/20/2017, 10:24 AM

## 2017-01-20 NOTE — Progress Notes (Signed)
RT came to do neb tx and flutter. Pt unavailable at this time

## 2017-01-20 NOTE — Progress Notes (Signed)
RT came to do neb tx. Neb tx done. Pt refused flutter and stated she did not need Bipap that she was breathing ok. Pt had a PC RN aware

## 2017-01-20 NOTE — Progress Notes (Signed)
Nutrition Consult/Brief Note  RD consulted per COPD Gold Protocol.  Wt Readings from Last 15 Encounters:  01/20/17 135 lb 12.9 oz (61.6 kg)  01/01/17 149 lb (67.6 kg)  12/21/16 149 lb 7.6 oz (67.8 kg)  11/02/16 143 lb 8 oz (65.1 kg)  10/17/16 140 lb (63.5 kg)  09/16/16 142 lb 10.2 oz (64.7 kg)  08/27/16 140 lb (63.5 kg)  06/02/16 140 lb (63.5 kg)  05/20/16 143 lb (64.9 kg)  05/16/16 142 lb 3.2 oz (64.5 kg)  04/22/16 142 lb (64.4 kg)  04/16/16 135 lb (61.2 kg)  04/08/16 140 lb 1.9 oz (63.6 kg)  03/18/16 139 lb 1.3 oz (63.1 kg)  01/07/16 140 lb 1.3 oz (63.5 kg)   Body mass index is 26.52 kg/m. Patient meets criteria for Overweight based on current BMI.   Current diet order is Heart Healthy, patient is consuming approximately 80% of meals at this time. Labs and medications reviewed.   No nutrition interventions warranted at this time. If nutrition issues arise, please consult RD.   Maureen Chatters, RD, LDN Pager #: [phone] After-Hours Pager #: [phone]

## 2017-01-20 NOTE — Evaluation (Signed)
Occupational Therapy Evaluation Patient Details Name: Sheila Pierce MRN: [id number] DOB: 02/02/1945 Today's Date: 01/20/2017    History of Present Illness Pt is a 71 y/o female with medical history significant for chronic diastolic CHF, anxiety, and COPD with chronic hypercarbic and hypoxic respiratory failure, now presenting to the emergency department for increased dyspnea secondary to a COPD exacerbation   Clinical Impression   PTA Pt getting assist for bathing and dressing from [facility] Aide, and uses wc for mobility. Pt is currently min A for UB ADL and max A for LB ADL and after consulting with NT mod A for stand pivot transfers to [facility]. Pt self-limiting this session, and limited to bed level eval. Pt positioned in bed for RT treatment session at the end of OT. OT will continue to follow in the acute setting as Pt would benefit from skilled OT to maximize safety and independence in ADL and functional transfers if she is willing to participate more. Pt will require SNF level therapy at dc to return to PLOF and for safety.    Follow Up Recommendations  SNF;Supervision/Assistance - 24 hour    Equipment Recommendations  Other (comment)(defer to next venue)    Recommendations for Other Services       Precautions / Restrictions Precautions Precautions: Fall Precaution Comments: watch SPO2 Restrictions Weight Bearing Restrictions: No      Mobility Bed Mobility Overal bed mobility: Needs Assistance Bed Mobility: Rolling Rolling: Min guard         General bed mobility comments: rolling for repositioning in bed; pt very anxious and refusing further mobility/ADL participation at this time despite max encouragement  Transfers                 General transfer comment: pt refusing at this time despite max encouragement. "I just want to take a nap"    Balance                                           ADL either performed or assessed with clinical judgement    ADL Overall ADL's : Needs assistance/impaired Eating/Feeding: Minimal assistance Eating/Feeding Details (indicate cue type and reason): to open some containers for set up Grooming: Min guard   Upper Body Bathing: Moderate assistance   Lower Body Bathing: Moderate assistance   Upper Body Dressing : Minimal assistance   Lower Body Dressing: Maximal assistance   Toilet Transfer: Moderate assistance;Stand-pivot;BSC(per NT report)   Toileting- Clothing Manipulation and Hygiene: Maximal assistance         General ADL Comments: Suspect sedentary lifestyle at baseline. Pt declined any OOB activity this session "I just want to sleep" set up for feeding (Pt drinking from cup with lid in bed). Pt declined oral care "I don't have any teeth, I don't want to brush my gums" Pt able to demonstrate hand to mouth for feeding and grooming. Pt recieves assist from [facility] aide at baseline for bathing/dressing and and IADL.     Vision         Perception     Praxis      Pertinent Vitals/Pain Pain Assessment: No/denies pain     Hand Dominance     Extremity/Trunk Assessment Upper Extremity Assessment Upper Extremity Assessment: Generalized weakness   Lower Extremity Assessment Lower Extremity Assessment: Defer to PT evaluation       Communication Communication Communication: No difficulties  Cognition Arousal/Alertness: Awake/alert Behavior During Therapy: Anxious Overall Cognitive Status: No family/caregiver present to determine baseline cognitive functioning                                     General Comments  RT came in at the end of session to peform breathing treatment. Pt also expressing concern over whether or not her family had been [facility]    Exercises     Shoulder Instructions      Home Living Family/patient expects to be discharged to:: Private residence Living Arrangements: Children Available Help at  Discharge: Family;Friend(s);Available 24 hours/day Type of Home: House Home Access: Stairs to enter [employer]ance Stairs-Number of Steps: 1   Home Layout: One level     Bathroom Shower/Tub: Other (comment)(takes a sponge bath [facility])         Home Equipment: Wheelchair - manual   Additional Comments: Pt states they will be moving soon because the house in currently in disrepair. [facility] aide has stopped coming because the situation is not condusive.      Prior Functioning/Environment Level of Independence: Needs assistance  Gait / Transfers Assistance Needed: pt reported that she uses a w/c primarily for mobility ADL's / Homemaking Assistance Needed: Pt reported that she has an aide that comes every day for ~3 hours a day who assists her with ADLs and IADLs            OT Problem List: Decreased activity tolerance;Impaired balance (sitting and/or standing);Decreased safety awareness      OT Treatment/Interventions: Self-care/ADL training;Therapeutic activities;Energy conservation;Patient/family education;Balance training    OT Goals(Current goals can be found in the care plan section) Acute Rehab OT Goals Patient Stated Goal: to feel better OT Goal Formulation: With patient Time For Goal Achievement: 02/03/17 Potential to Achieve Goals: Fair ADL Goals Pt Will Perform Grooming: with set-up;sitting Pt Will Transfer to Toilet: with supervision;bedside commode Pt Will Perform Toileting - Clothing Manipulation and hygiene: with min assist;sitting/lateral leans Additional ADL Goal #1: Pt will perform bed mobility at supervision level prior to engaging in ADL activity  OT Frequency: Min 2X/week   Barriers to D/C: Inaccessible home environment  Pt is moving due to the state of her home       Co-evaluation              AM-PAC PT "6 Clicks" Daily Activity     Outcome Measure Help from another person eating meals?: A Little Help from another person  taking care of personal grooming?: A Little Help from another person toileting, which includes using toliet, bedpan, or urinal?: A Lot Help from another person bathing (including washing, rinsing, drying)?: A Lot Help from another person to put on and taking off regular upper body clothing?: A Lot Help from another person to put on and taking off regular lower body clothing?: A Lot 6 Click Score: 14   End of Session Equipment Utilized During Treatment: Oxygen Nurse Communication: Mobility status  Activity Tolerance: Other (comment)(Self-limiting) Patient left: in bed;with call bell/phone within reach;Other (comment)(with RT present)  OT Visit Diagnosis: Muscle weakness (generalized) (M62.81);Other abnormalities of gait and mobility (R26.89)                Time: [id number] OT Time Calculation (min): 13 min Charges:  OT General Charges $OT Visit: 1 Visit OT Evaluation $OT Eval  Moderate Complexity: 1 Mod G-Codes:     Sherryl Manges OTR/L [phone] Sheila Pierce 01/20/2017, 5:13 PM

## 2017-01-20 NOTE — Progress Notes (Signed)
Pt refusing SNF prefers to go home with home services.  CSW discussed home safety concerns but pt states she gets sufficient care at home and reports having all needs met by family and home caregivers.  Read full assessment for more details  CSW updated RNCM on pt decision to return home- CSW signing off  Jorge Ny, [place] Social Worker [phone]

## 2017-01-20 NOTE — ED Notes (Signed)
Pt verablized consent to be transported by [facility] to [facility]. [place] signature pad not working.

## 2017-01-20 NOTE — Clinical Social Work Note (Signed)
Clinical Social Work Assessment  Patient Details  Name: Sheila Pierce MRN: [id number] Date of Birth: [birth year]  Date of referral:  11/01/16               Reason for consult:  Other (Comment Required)(COPD Gold )                Permission sought to share information with:    Permission granted to share information::     Name::        Agency::     Relationship::     Contact Information:     Housing/Transportation Living arrangements for the past 2 months:  Single Family Home Source of Information:  Patient Patient Interpreter Needed:  None Criminal Activity/Legal Involvement Pertinent to Current Situation/Hospitalization:  No - Comment as needed Significant Relationships:  Adult Children Lives with:  Adult Children Do you feel safe going back to the place where you live?  Yes Need for family participation in patient care:  Yes (Comment)  Care giving concerns:  Pt lives at home with 2 sons and a friend.  One son works night shift so is home throughout the day but sleeping and other son is disabled but is always there to assist in the evenings.  Pt feels as if she has all of her care needs managed by family and her home aids.  Has aids that come in daily for 3/hr per day to assist with bathing etc.  Pt family helps with food- states sometimes she does not get lunch but she always gets breakfast and her son makes her dinner.  States she spends most her time in bed and does not mobilize independently.  States her family gets her out of bed for meals or sometimes to just sit up but she is not very mobile at home at baseline.  CSW inquired about medications- states her son helps her manage medications but sometimes they can't afford to get them.  Per chart review APS report was made in October due to pt home having bed bugs and family being unwilling to get exterminator to allow for home health to come in. Also note stating EMS plans to do APS report based on home findings from this  admission.    Social Worker assessment / plan:  CSW discussed care concerns with pt and PT recommendation for SNF.  Pt reports having been to SNF in the past and had a negative experience.  Employment status:  Retired Database administrator PT Recommendations:  Not assessed at this time Information / Referral to community resources:     Patient/Family's Response to care:  Pt not agreeable to SNF at this time- prefers to return home with home services.  Patient/Family's Understanding of and Emotional Response to Diagnosis, Current Treatment, and Prognosis:  Unclear understanding of current state and needs.  Pt determined to return home despite high level of needs.  Emotional Assessment Appearance:  Appears stated age Attitude/Demeanor/Rapport:    Affect (typically observed):  Sad, Tearful/Crying Orientation:  Oriented to Situation, Oriented to  Time, Oriented to Place, Oriented to Self Alcohol / Substance use:  Not Applicable Psych involvement (Current and /or in the community):  No (Comment)  Discharge Needs  Concerns to be addressed:  Mental Health Concerns Readmission within the last 30 days:  Yes Current discharge risk:  None Barriers to Discharge:  No Barriers Identified   Burna Sis, LCSW 01/20/2017, 3:03 PM

## 2017-01-20 NOTE — Progress Notes (Signed)
RT called to the bedside to place pt on BIPAP. Pt in no distress. Pt on 3L and anxious wanting something to drink. Drink provided. Pt refuses BIPAP at this time and states she does not need BIPAP only something to drink. RN notified. Pt states she just not hungry now. RT will continue to monitor

## 2017-01-21 ENCOUNTER — Encounter: Payer: Self-pay | Admitting: Family Medicine

## 2017-01-21 DIAGNOSIS — J101 Influenza due to other identified influenza virus with other respiratory manifestations: Secondary | ICD-10-CM

## 2017-01-21 LAB — POTASSIUM: Potassium: 5 mmol/L (ref 3.5–5.1)

## 2017-01-21 LAB — BASIC METABOLIC PANEL
ANION GAP: 3 — AB (ref 5–15)
BUN: 32 mg/dL — AB (ref 6–20)
CALCIUM: 8.7 mg/dL — AB (ref 8.9–10.3)
CO2: 39 mmol/L — ABNORMAL HIGH (ref 22–32)
Chloride: 95 mmol/L — ABNORMAL LOW (ref 101–111)
Creatinine, Ser: 0.87 mg/dL (ref 0.44–1.00)
GFR calc Af Amer: >60 mL/min (ref >60)
GLUCOSE: 138 mg/dL — AB (ref 65–99)
Potassium: 5.4 mmol/L — ABNORMAL HIGH (ref 3.5–5.1)
Sodium: 137 mmol/L (ref 135–145)

## 2017-01-21 LAB — CBC
HCT: 33.5 % — ABNORMAL LOW (ref 36.0–46.0)
Hemoglobin: 10.1 g/dL — ABNORMAL LOW (ref 12.0–15.0)
MCH: 29 pg (ref 26.0–34.0)
MCHC: 30.1 g/dL (ref 30.0–36.0)
MCV: 96.3 fL (ref 78.0–100.0)
PLATELETS: 207 10*3/uL (ref 150–400)
RBC: 3.48 MIL/uL — ABNORMAL LOW (ref 3.87–5.11)
RDW: 15.3 % (ref 11.5–15.5)
WBC: 18.7 10*3/uL — ABNORMAL HIGH (ref 4.0–10.5)

## 2017-01-21 LAB — GLUCOSE, CAPILLARY: Glucose-Capillary: 164 mg/dL — ABNORMAL HIGH (ref 65–99)

## 2017-01-21 LAB — MAGNESIUM: MAGNESIUM: 2.2 mg/dL (ref 1.7–2.4)

## 2017-01-21 MED ORDER — OSELTAMIVIR PHOSPHATE 30 MG PO CAPS
30.0000 mg | ORAL_CAPSULE | Freq: Two times a day (BID) | ORAL | Status: AC
  Administered 2017-01-21 – 2017-01-25 (×8): 30 mg via ORAL
  Filled 2017-01-21 (×10): qty 1

## 2017-01-21 MED ORDER — LORAZEPAM 2 MG/ML IJ SOLN
0.5000 mg | Freq: Four times a day (QID) | INTRAMUSCULAR | Status: DC | PRN
  Administered 2017-01-22: 0.5 mg via INTRAVENOUS
  Administered 2017-01-22 – 2017-01-26 (×5): 1 mg via INTRAVENOUS
  Administered 2017-01-26 – 2017-01-27 (×3): 0.5 mg via INTRAVENOUS
  Administered 2017-01-27: 1 mg via INTRAVENOUS
  Filled 2017-01-21 (×10): qty 1

## 2017-01-21 NOTE — Progress Notes (Signed)
PHARMACY NOTE:  ANTIMICROBIAL RENAL DOSAGE ADJUSTMENT Current antimicrobial regimen includes a mismatch between antimicrobial dosage and estimated renal function.  As per policy approved by the Pharmacy & Therapeutics and Medical Executive Committees, the antimicrobial dosage will be adjusted accordingly.  Current antimicrobial dosage ordered:  Tamiflu PO 75 mg BID  Renal Function: Estimated Creatinine Clearance: 48.4 mL/min (by C-G formula based on SCr of 0.87 mg/dL).  Antimicrobial dosage has been changed to: Tamiflu PO 30 mg BID  Thank you for allowing pharmacy to be a part of this patient's care.  Pollyann Samples, PharmD, BCPS 01/21/2017, 9:35 AM

## 2017-01-21 NOTE — Progress Notes (Addendum)
Patient in much better spirits this evening after confessing she can not breath well and that is why she can not get out of bed. Patient needs encouragement to perform ADLS on her own ie. eating and setting up her meals. Patient states she is able to shadows of people due to her cataracts. Patient verbalizes today has been a better day with relief from her headache with Tramadol and po Zofran.

## 2017-01-21 NOTE — Progress Notes (Signed)
Pt refused BIPAP she is concerned that her insurance will not pay for it RT explained the importance of wearing BIPAP Pt still refused.  Pt in no distress.  RT will continue to monitor.

## 2017-01-21 NOTE — Progress Notes (Signed)
PROGRESS NOTE    Sheila Pierce  WUJ:[id number] DOB: Mar 04, 1945 DOA: 01/19/2017 PCP: [name], MD   Brief Narrative:  71 y.o. WF PMHx   Anxiety, Depression, NSTEMI, Chronic Diastolic CHF, HTN, QT prolongation, HLD, COPD, Chronic resp failure on home O2 3 L via [place],Tobacco abuse, CKD, Diabetes mellitus without complication,  Anemia, Trigeminal neuralgia  Presenting to the emergency department for increased dyspnea.  Patient reports chronic shortness of breath, but notes that this has decreased markedly in recent days point where she is now unable to speak more than 1-2 words without gasping for air.  Denies fevers or chills and denies chest pain or palpitations.   ED Course: Upon arrival to the ED, patient is found to be afebrile, saturating adequately on 3 L/min of supplemental oxygen, tachypneic, tachycardic, and with stable blood pressure.  EKG features a sinus tachycardia with rate 110 and chest x-ray is negative for acute cardiopulmonary disease.  Chemistry panel reveals a slight hyponatremia carbonate of 35.  BUN to creatinine ratio is elevated.  CBC is unremarkable.  ABG reveals pH 7.26, pCO2 88, pO2 102.  Patient was treated with continuous albuterol neb, 2 g IV magnesium, 125 mg IV Solu-Medrol, and started on BiPAP.  She remains hemodynamically stable, remains markedly dyspneic, and will be admitted to the stepdown unit for ongoing evaluation and management of acute exacerbation in COPD.    Subjective: 12/28  A/O 4, negative CP, positive SOB, negative bowel pain negative N/V. Negative productive cough.    Assessment & Plan:   Active Problems:   CAD (coronary artery disease)   Anxiety   COPD exacerbation (HCC)   Chronic diastolic CHF (congestive heart failure) (HCC)   Acute on chronic respiratory failure with hypercapnia (HCC)   COPD with acute exacerbation (HCC)   Sepsis unspecified organism? -Patient meets criteria on admission HR> 90, RR> 20. In addition patient has  lactic acidosis 2.3 -Normal saline 50 ml/hr -Complete 5 day course of levofloxacin -Lactic acid normalized.  positive influenza A -Complete course of Tamiflu  Acute on Chronic Respiratory Failure with Hypoxia and hypercarbia/COPD exacerbation -DuoNeb QID -Solu-Medrol 60 mg TID -Mucinex DM -Flutter valve -BiPAP PRN -Patient still extremely tight, slight improvement: Racemic epi if new improvement.  Chronic Diastolic CHF (base weight?) -EKG from 01/19/2017 compared to EKG 01/01/2017 no significant change. Sinus tachycardia -Strict in and out since admission +1.4L -Daily weight Filed Weights   01/20/17 0457 01/21/17 0500  Weight: 135 lb 12.9 oz (61.6 kg) 138 lb 14.2 oz (63 kg)  -metoprolol 25 mg BID  CAD  -Asymptomatic -ASA 81 mg daily -See CHF  Anxiety -BuSpar 15 mg daily  -Ativan PRN     DVT prophylaxis: Lovenox Code Status: DNR Family Communication: None Disposition Plan: TPD   Consultants:  None   Procedures/Significant Events:  12/17/16 Echocardiogram: LVEF=:65% to 70%.-Grade 1 diastolic dysfunction). - Atrial septum: There was increased thickness of the septum, c/w lipomatous hypertrophy.  -----------------------------------------------------------------------------------------------------------------------------------    I have personally reviewed and interpreted all radiology studies and my findings are as above.  VENTILATOR SETTINGS:    Cultures 12/27 MRSA by PCR negative 12/27 respiratory virus panel positive influenza A     Antimicrobials: Anti-infectives (From admission, onward)   Start     Stop   01/21/17 0945  oseltamivir (TAMIFLU) capsule 30 mg     01/26/17 0959   01/20/17 1200  levofloxacin (LEVAQUIN) IVPB 750 mg     01/24/17 2359       Devices  LINES / TUBES:      Continuous Infusions: . sodium chloride 75 mL/hr at 01/20/17 1231  . levofloxacin (LEVAQUIN) IV Stopped (01/20/17 1650)     Objective: Vitals:    01/21/17 0300 01/21/17 0500 01/21/17 0600 01/21/17 0722  BP: (!) 124/58  (!) 124/55   Pulse: 85  89   Resp: 17  12   Temp: 98.3 F (36.8 C)   98.4 F (36.9 C)  TempSrc: Oral   Oral  SpO2: 100%  100%   Weight:  138 lb 14.2 oz (63 kg)      Intake/Output Summary (Last 24 hours) at 01/21/2017 5284 Last data filed at 01/21/2017 0600 Gross per 24 hour  Intake 3005 ml  Output 1200 ml  Net 1805 ml   Filed Weights   01/20/17 0457 01/21/17 0500  Weight: 135 lb 12.9 oz (61.6 kg) 138 lb 14.2 oz (63 kg)    Physical Exam:  General: A/O 4, positive acute on chronic respiratory distress Neck:  Negative scars, masses, torticollis, lymphadenopathy, JVD Lungs: extremity poor air movement diffusely, diffuse expiratory wheeze, negative crackles  Cardiovascular: Regular rate and rhythm without murmur gallop or rub normal S1 and S2 Abdomen: Obese, negative abdominal pain, nondistended, positive soft, bowel sounds, no rebound, no ascites, no appreciable mass Extremities: No significant cyanosis, clubbing, or edema bilateral lower extremities Skin: Negative rashes, lesions, ulcers Psychiatric:  Negative depression, negative anxiety, negative fatigue, negative mania  Central nervous system:  Cranial nerves II through XII intact, tongue/uvula midline, all extremities muscle strength 5/5, sensation intact throughout,negative dysarthria, negative expressive aphasia, negative receptive aphasia. .     Data Reviewed: Care during the described time interval was provided by me .  I have reviewed this patient's available data, including medical history, events of note, physical examination, and all test results as part of my evaluation.   CBC: Recent Labs  Lab 01/19/17 1724 01/20/17 0744 01/21/17 0219  WBC 9.8 10.0 18.7*  NEUTROABS 7.5  --   --   HGB 11.5* 10.6* 10.1*  HCT 37.7 35.1* 33.5*  MCV 95.7 94.4 96.3  PLT 212 201 207   Basic Metabolic Panel: Recent Labs  Lab 01/19/17 1724  01/20/17 0744 01/21/17 0219  NA 134* 134* 137  K 3.9 4.8 5.4*  CL 89* 90* 95*  CO2 35* 35* 39*  GLUCOSE 125* 156* 138*  BUN 36* 35* 32*  CREATININE 0.97 0.99 0.87  CALCIUM 8.9 8.5* 8.7*  MG  --  2.7* 2.2   GFR: Estimated Creatinine Clearance: 48.4 mL/min (by C-G formula based on SCr of 0.87 mg/dL). Liver Function Tests: Recent Labs  Lab 01/19/17 1724  AST 15  ALT 39  ALKPHOS 70  BILITOT 0.7  PROT 6.3*  ALBUMIN 3.2*   No results for input(s): LIPASE, AMYLASE in the last 168 hours. No results for input(s): AMMONIA in the last 168 hours. Coagulation Profile: No results for input(s): INR, PROTIME in the last 168 hours. Cardiac Enzymes: Recent Labs  Lab 01/19/17 1724  TROPONINI <0.03   BNP (last 3 results) No results for input(s): PROBNP in the last 8760 hours. HbA1C: No results for input(s): HGBA1C in the last 72 hours. CBG: Recent Labs  Lab 01/20/17 0826 01/21/17 0721  GLUCAP 142* 164*   Lipid Profile: No results for input(s): CHOL, HDL, LDLCALC, TRIG, CHOLHDL, LDLDIRECT in the last 72 hours. Thyroid Function Tests: No results for input(s): TSH, T4TOTAL, FREET4, T3FREE, THYROIDAB in the last 72 hours. Anemia Panel: No results for input(s):  VITAMINB12, FOLATE, FERRITIN, TIBC, IRON, RETICCTPCT in the last 72 hours. Urine analysis:    Component Value Date/Time   COLORURINE YELLOW 04/16/2016 1920   APPEARANCEUR HAZY (A) 04/16/2016 1920   LABSPEC 1.006 04/16/2016 1920   PHURINE 6.0 04/16/2016 1920   GLUCOSEU NEGATIVE 04/16/2016 1920   HGBUR SMALL (A) 04/16/2016 1920   BILIRUBINUR neg 11/24/2016 1131   KETONESUR NEGATIVE 04/16/2016 1920   PROTEINUR neg 11/24/2016 1131   PROTEINUR NEGATIVE 04/16/2016 1920   UROBILINOGEN 0.2 11/24/2016 1131   NITRITE neg 11/24/2016 1131   NITRITE POSITIVE (A) 04/16/2016 1920   LEUKOCYTESUR Small (1+) (A) 11/24/2016 1131   Sepsis Labs: @LABRCNTIP (procalcitonin:4,lacticidven:4)  ) Recent Results (from the past 240  hour(s))  MRSA PCR Screening     Status: None   Collection Time: 01/20/17  1:15 AM  Result Value Ref Range Status   MRSA by PCR NEGATIVE NEGATIVE Final    Comment:        The GeneXpert MRSA Assay (FDA approved for NASAL specimens only), is one component of a comprehensive MRSA colonization surveillance program. It is not intended to diagnose MRSA infection nor to guide or monitor treatment for MRSA infections.   Respiratory Panel by PCR     Status: Abnormal   Collection Time: 01/20/17  9:31 AM  Result Value Ref Range Status   Adenovirus NOT DETECTED NOT DETECTED Final   Coronavirus 229E NOT DETECTED NOT DETECTED Final   Coronavirus HKU1 NOT DETECTED NOT DETECTED Final   Coronavirus NL63 NOT DETECTED NOT DETECTED Final   Coronavirus OC43 NOT DETECTED NOT DETECTED Final   Metapneumovirus NOT DETECTED NOT DETECTED Final   Rhinovirus / Enterovirus NOT DETECTED NOT DETECTED Final   Influenza A H1 2009 DETECTED (A) NOT DETECTED Final   Influenza B NOT DETECTED NOT DETECTED Final   Parainfluenza Virus 1 NOT DETECTED NOT DETECTED Final   Parainfluenza Virus 2 NOT DETECTED NOT DETECTED Final   Parainfluenza Virus 3 NOT DETECTED NOT DETECTED Final   Parainfluenza Virus 4 NOT DETECTED NOT DETECTED Final   Respiratory Syncytial Virus NOT DETECTED NOT DETECTED Final   Bordetella pertussis NOT DETECTED NOT DETECTED Final   Chlamydophila pneumoniae NOT DETECTED NOT DETECTED Final   Mycoplasma pneumoniae NOT DETECTED NOT DETECTED Final  Culture, expectorated sputum-assessment     Status: None   Collection Time: 01/20/17  4:21 PM  Result Value Ref Range Status   Specimen Description EXPECTORATED SPUTUM  Final   Special Requests NONE  Final   Sputum evaluation THIS SPECIMEN IS ACCEPTABLE FOR SPUTUM CULTURE  Final   Report Status 01/20/2017 FINAL  Final  Culture, respiratory (NON-Expectorated)     Status: None (Preliminary result)   Collection Time: 01/20/17  4:21 PM  Result Value Ref Range  Status   Specimen Description EXPECTORATED SPUTUM  Final   Special Requests NONE Reflexed from [id number]  Final   Gram Stain   Final    ABUNDANT WBC PRESENT, PREDOMINANTLY PMN RARE GRAM POSITIVE COCCI IN CHAINS RARE GRAM NEGATIVE COCCI IN PAIRS NO SQUAMOUS EPITHELIAL CELLS SEEN    Culture PENDING  Incomplete   Report Status PENDING  Incomplete         Radiology Studies: Dg Chest 2 View  Result Date: 01/19/2017 CLINICAL DATA:  71 year old female with CHF and COPD and shortness of breath. EXAM: CHEST  2 VIEW COMPARISON:  Chest radiograph dated 01/10/2017 FINDINGS: There is emphysematous changes of the lungs. There are bibasilar linear atelectasis/ scarring. No focal consolidation, pleural effusion,  or pneumothorax. The cardiac silhouette is within normal limits. There is coronary vascular calcification as well as atherosclerotic calcification of the aorta. No acute osseous pathology. IMPRESSION: No active cardiopulmonary disease. Electronically Signed   By: Elgie CollardArash  Radparvar M.D.   On: 01/19/2017 18:08        Scheduled Meds: . aspirin EC  81 mg Oral Daily  . busPIRone  15 mg Oral BID  . chlorhexidine  15 mL Mouth Rinse BID  . cholecalciferol  1,000 Units Oral Daily  . dextromethorphan-guaiFENesin  1 tablet Oral BID  . enoxaparin (LOVENOX) injection  40 mg Subcutaneous Q24H  . fluticasone furoate-vilanterol  1 puff Inhalation Daily  . ipratropium-albuterol  3 mL Nebulization QID  . mouth rinse  15 mL Mouth Rinse q12n4p  . methylPREDNISolone (SOLU-MEDROL) injection  60 mg Intravenous Q8H  . metoprolol tartrate  25 mg Oral BID  . multivitamin with minerals  1 tablet Oral Daily  . sodium chloride flush  3 mL Intravenous Q12H   Continuous Infusions: . sodium chloride 75 mL/hr at 01/20/17 1231  . levofloxacin (LEVAQUIN) IV Stopped (01/20/17 1650)     LOS: 2 days    Time spent: 40 minutes    Kentley Blyden, Roselind MessierURTIS J, MD [facility] Pager [phone]   If 7PM-7AM, please  contact night-coverage [email or web address] Password [email or web address] 01/21/2017, 7:42 AM

## 2017-01-21 NOTE — Progress Notes (Signed)
Pt very agitated stating she wants to be left alone. Pt continues to refuse BIPAP and to do flutter with RT. Pt has a congested NPC. Pt in no distress. Pt agreed to take breathing txs only. RN aware

## 2017-01-21 NOTE — Progress Notes (Addendum)
Physical Therapy Treatment Patient Details Name: Sheila Pierce MRN: [id number] DOB: 1945-02-16 Today's Date: 01/21/2017    History of Present Illness Pt is a 71 y/o female with medical history significant for chronic diastolic CHF, anxiety, and COPD with chronic hypercarbic and hypoxic respiratory failure, now presenting to the emergency department for increased dyspnea secondary to a COPD exacerbation    PT Comments    Pt admitted with above diagnosis. Pt currently with functional limitations due to the deficits listed below (see PT Problem List). Pt refused to do more than bed mobility again but finally told nurse it was because she "can't breathe".  Will continue to attempt to see pt as pt allows. Continue to recommend SNF as pt has questionable home situation per chart.  Pt will benefit from skilled PT to increase their independence and safety with mobility to allow discharge to the venue listed below.     Follow Up Recommendations  SNF;Supervision/Assistance - 24 hour     Equipment Recommendations  None recommended by PT    Recommendations for Other Services       Precautions / Restrictions Precautions Precautions: Fall Precaution Comments: watch SPO2 Restrictions Weight Bearing Restrictions: No    Mobility  Bed Mobility Overal bed mobility: Needs Assistance Bed Mobility: Rolling Rolling: Min guard         General bed mobility comments: rolling for repositioning in bed; pt very anxious and refusing further mobility/ADL participation at this time despite max encouragement.  Finally pt does report to nurse that she is having trouble breathing and that is why she is hesitant.   Transfers                 General transfer comment: pt refusing at this time despite max encouragement. "I am not getting up out of this bed."  Ambulation/Gait                 Stairs            Wheelchair Mobility    Modified Rankin (Stroke Patients Only)        Balance                                            Cognition Arousal/Alertness: Awake/alert Behavior During Therapy: Anxious Overall Cognitive Status: No family/caregiver present to determine baseline cognitive functioning                                 General Comments: cognition not formally assessed but [facility] for general conversation      Exercises      General Comments        Pertinent Vitals/Pain Pain Assessment: No/denies pain  VSS while PT in room but nurse reports pt desat after PT left with talking.   Home Living                      Prior Function            PT Goals (current goals can now be found in the care plan section) Acute Rehab PT Goals Patient Stated Goal: to feel better Progress towards PT goals: Not progressing toward goals - comment(refuses to do more than bed mobility. )    Frequency    Min 2X/week      PT  Plan Current plan remains appropriate;Frequency needs to be updated    Co-evaluation              AM-PAC PT "6 Clicks" Daily Activity  Outcome Measure  Difficulty turning over in bed (including adjusting bedclothes, sheets and blankets)?: Unable Difficulty moving from lying on back to sitting on the side of the bed? : Unable Difficulty sitting down on and standing up from a chair with arms (e.g., wheelchair, bedside commode, etc,.)?: Unable Help needed moving to and from a bed to chair (including a wheelchair)?: Total Help needed walking in hospital room?: Total Help needed climbing 3-5 steps with a railing? : Total 6 Click Score: 6    End of Session Equipment Utilized During Treatment: Oxygen Activity Tolerance: Patient limited by fatigue(self limiting) Patient left: in bed;with call bell/phone within reach;with nursing/sitter in room Nurse Communication: Mobility status PT Visit Diagnosis: Other abnormalities of gait and mobility (R26.89)     Time: 1031-1040 PT Time Calculation  (min) (ACUTE ONLY): 9 min  Charges:  $Therapeutic Activity: 8-22 mins                    G Codes:       Tyresha Fede,PT Acute Rehabilitation [phone] (pager)    Berline Lopesawn F Lawton Dollinger 01/21/2017, 11:01 AM

## 2017-01-22 ENCOUNTER — Inpatient Hospital Stay (HOSPITAL_COMMUNITY): Payer: Medicare HMO

## 2017-01-22 DIAGNOSIS — E872 Acidosis: Secondary | ICD-10-CM

## 2017-01-22 LAB — BLOOD GAS, ARTERIAL
Acid-Base Excess: 12.1 mmol/L — ABNORMAL HIGH (ref 0.0–2.0)
Acid-Base Excess: 14 mmol/L — ABNORMAL HIGH (ref 0.0–2.0)
BICARBONATE: 39.9 mmol/L — AB (ref 20.0–28.0)
BICARBONATE: 40.6 mmol/L — AB (ref 20.0–28.0)
DRAWN BY: 277551
Delivery systems: POSITIVE
Drawn by: 27022
Expiratory PAP: 6
FIO2: 40
Inspiratory PAP: 16
O2 Content: 2 L/min
O2 Saturation: 96.4 %
O2 Saturation: 97.4 %
PATIENT TEMPERATURE: 98.6
PATIENT TEMPERATURE: 98.6
PCO2 ART: 101 mmHg — AB (ref 32.0–48.0)
PH ART: 7.221 — AB (ref 7.350–7.450)
PO2 ART: 89 mmHg (ref 83.0–108.0)
pCO2 arterial: 81.5 mmHg (ref 32.0–48.0)
pH, Arterial: 7.318 — ABNORMAL LOW (ref 7.350–7.450)
pO2, Arterial: 96.9 mmHg (ref 83.0–108.0)

## 2017-01-22 LAB — CBC
HEMATOCRIT: 35.6 % — AB (ref 36.0–46.0)
Hemoglobin: 10.4 g/dL — ABNORMAL LOW (ref 12.0–15.0)
MCH: 29.1 pg (ref 26.0–34.0)
MCHC: 29.2 g/dL — AB (ref 30.0–36.0)
MCV: 99.7 fL (ref 78.0–100.0)
PLATELETS: 228 10*3/uL (ref 150–400)
RBC: 3.57 MIL/uL — ABNORMAL LOW (ref 3.87–5.11)
RDW: 15.2 % (ref 11.5–15.5)
WBC: 20.9 10*3/uL — ABNORMAL HIGH (ref 4.0–10.5)

## 2017-01-22 LAB — BASIC METABOLIC PANEL
Anion gap: 7 (ref 5–15)
BUN: 24 mg/dL — AB (ref 6–20)
CO2: 37 mmol/L — ABNORMAL HIGH (ref 22–32)
CREATININE: 0.68 mg/dL (ref 0.44–1.00)
Calcium: 8.8 mg/dL — ABNORMAL LOW (ref 8.9–10.3)
Chloride: 94 mmol/L — ABNORMAL LOW (ref 101–111)
GFR calc Af Amer: >60 mL/min (ref >60)
GLUCOSE: 126 mg/dL — AB (ref 65–99)
POTASSIUM: 5.4 mmol/L — AB (ref 3.5–5.1)
Sodium: 138 mmol/L (ref 135–145)

## 2017-01-22 LAB — GLUCOSE, CAPILLARY: GLUCOSE-CAPILLARY: 141 mg/dL — AB (ref 65–99)

## 2017-01-22 LAB — MAGNESIUM: Magnesium: 2.2 mg/dL (ref 1.7–2.4)

## 2017-01-22 MED ORDER — METOPROLOL TARTRATE 5 MG/5ML IV SOLN
5.0000 mg | Freq: Three times a day (TID) | INTRAVENOUS | Status: DC | PRN
  Administered 2017-01-22 – 2017-01-23 (×3): 5 mg via INTRAVENOUS
  Filled 2017-01-22 (×4): qty 5

## 2017-01-22 MED ORDER — LEVOFLOXACIN 750 MG PO TABS
750.0000 mg | ORAL_TABLET | ORAL | Status: DC
  Administered 2017-01-24: 750 mg via ORAL
  Filled 2017-01-22: qty 1

## 2017-01-22 NOTE — Progress Notes (Signed)
PROGRESS NOTE    Sheila Pierce  UVO:[id number] DOB: 06/14/1945 DOA: 01/19/2017 PCP: [name], MD   Brief Narrative:  71 y.o. WF PMHx   Anxiety, Depression, NSTEMI, Chronic Diastolic CHF, HTN, QT prolongation, HLD, COPD, Chronic resp failure on home O2 3 L via [place],Tobacco abuse, CKD, Diabetes mellitus without complication,  Anemia, Trigeminal neuralgia  Presenting to the emergency department for increased dyspnea.  Patient reports chronic shortness of breath, but notes that this has decreased markedly in recent days point where she is now unable to speak more than 1-2 words without gasping for air.  Denies fevers or chills and denies chest pain or palpitations.   ED Course: Upon arrival to the ED, patient is found to be afebrile, saturating adequately on 3 L/min of supplemental oxygen, tachypneic, tachycardic, and with stable blood pressure.  EKG features a sinus tachycardia with rate 110 and chest x-ray is negative for acute cardiopulmonary disease.  Chemistry panel reveals a slight hyponatremia carbonate of 35.  BUN to creatinine ratio is elevated.  CBC is unremarkable.  ABG reveals pH 7.26, pCO2 88, pO2 102.  Patient was treated with continuous albuterol neb, 2 g IV magnesium, 125 mg IV Solu-Medrol, and started on BiPAP.  She remains hemodynamically stable, remains markedly dyspneic, and will be admitted to the stepdown unit for ongoing evaluation and management of acute exacerbation in COPD.    Subjective: 12/29  A/O 3 (does not know why), confused combative. Unable to understand why she needs BiPAP, constantly requesting to eat. NOTE: Overnight patient refused BiPAP per RT notes.        Assessment & Plan:   Active Problems:   CAD (coronary artery disease)   Anxiety   COPD exacerbation (HCC)   Chronic diastolic CHF (congestive heart failure) (HCC)   Acute on chronic respiratory failure with hypercapnia (HCC)   COPD with acute exacerbation (HCC)   Sepsis unspecified  organism? -Patient meets criteria on admission HR> 90, RR> 20. In addition patient has lactic acidosis 2.3 -Normal saline 50 ml/hr -Complete 5 day course of levofloxacin -Lactic acid normalized.  positive influenza A -Complete course of Tamiflu  Acute on Chronic Respiratory Failure with Hypoxia and hypercarbia/COPD exacerbation -DuoNeb QID -Solu-Medrol 60 mg TID -Mucinex DM -Flutter valve -BiPAP PRN -Discussed case with Dr. Molli Knock PC CM: Racemic Epi , usually not used in this case anymore however not contraindicated if no improvement in patients respiratory status. Marland Kitchen  Respiratory acidosis -Patient retaining CO2, refused BiPAP overnight now with severe respiratory acidosis. -Patient commits to wear BiPAP: ABG just prior to starting BiPAP pH 7.22, PCO2= 101, PO2= 89, HCO3= 37 -Patient now on BiPAP resting much more comfortably -Repeat ABG 1800. -RN instructions: ENSURE patient wears BiPAP notify on-call physician when BiPAP discontinued.   Chronic Diastolic CHF (base weight?) -EKG from 01/19/2017 compared to EKG 01/01/2017 no significant change. Sinus tachycardia -Strict in and out since admission +2.9 L -Daily weight Filed Weights   01/20/17 0457 01/21/17 0500 01/22/17 0308  Weight: 135 lb 12.9 oz (61.6 kg) 138 lb 14.2 oz (63 kg) 145 lb 11.6 oz (66.1 kg)  -Metoprolol 25 mg BID -Metoprolol IV 5 mg PRN HR> 105  CAD  -Asymptomatic -ASA 81 mg daily -See CHF  Anxiety -BuSpar 15 mg daily -Ativan PRN     DVT prophylaxis: Lovenox Code Status: DNR Family Communication: None Disposition Plan: TPD   Consultants:  None   Procedures/Significant Events:  12/17/16 Echocardiogram: LVEF=:65% to 70%.-Grade 1 diastolic dysfunction). - Atrial  septum: There was increased thickness of the septum, c/w lipomatous hypertrophy.  -----------------------------------------------------------------------------------------------------------------------------------    I have personally  reviewed and interpreted all radiology studies and my findings are as above.  VENTILATOR SETTINGS:    Cultures 12/27 MRSA by PCR negative 12/27 respiratory virus panel positive influenza A     Antimicrobials: Anti-infectives (From admission, onward)   Start     Stop   01/21/17 0945  oseltamivir (TAMIFLU) capsule 30 mg     01/26/17 0959   01/20/17 1200  levofloxacin (LEVAQUIN) IVPB 750 mg     01/24/17 2359       Devices  LINES / TUBES:      Continuous Infusions: . sodium chloride 50 mL/hr at 01/21/17 1930  . levofloxacin (LEVAQUIN) IV Stopped (01/20/17 1650)     Objective: Vitals:   01/22/17 0400 01/22/17 0725 01/22/17 0800 01/22/17 0825  BP: (!) 115/101  (!) 119/51   Pulse: 91  91   Resp: (!) 24  18   Temp:  98.8 F (37.1 C)    TempSrc:  Oral    SpO2: 95%  99% 100%  Weight:        Intake/Output Summary (Last 24 hours) at 01/22/2017 1107 Last data filed at 01/22/2017 0303 Gross per 24 hour  Intake 1880.5 ml  Output 1050 ml  Net 830.5 ml   Filed Weights   01/20/17 0457 01/21/17 0500 01/22/17 0308  Weight: 135 lb 12.9 oz (61.6 kg) 138 lb 14.2 oz (63 kg) 145 lb 11.6 oz (66.1 kg)    Physical Exam:  General: A/O 3 (does not know why) positive acute on chronic respiratory distress Neck:  Negative scars, masses, torticollis, lymphadenopathy, JVD Lungs: tachypnea, increased WOB, extremely poor air movement diffusely, diffuse expiratory wheeze, Clear to auscultation bilaterally without wheezes or crackles Cardiovascular: Tachycardic, Regular rate and rhythm without murmur gallop or rub normal S1 and S2 Abdomen: negative abdominal pain, nondistended, positive soft, bowel sounds, no rebound, no ascites, no appreciable mass Extremities: No significant cyanosis, clubbing, or edema bilateral lower extremities Skin: Negative rashes, lesions, ulcers Psychiatric:  Negative depression, negative anxiety, negative fatigue, negative mania  Central nervous system:   Cranial nerves II through XII intact, tongue/uvula midline, all extremities muscle strength 5/5, sensation intact throughout, negative dysarthria, negative expressive aphasia, negative receptive aphasia.     Data Reviewed: Care during the described time interval was provided by me .  I have reviewed this patient's available data, including medical history, events of note, physical examination, and all test results as part of my evaluation.   CBC: Recent Labs  Lab 01/19/17 1724 01/20/17 0744 01/21/17 0219 01/22/17 0513  WBC 9.8 10.0 18.7* 20.9*  NEUTROABS 7.5  --   --   --   HGB 11.5* 10.6* 10.1* 10.4*  HCT 37.7 35.1* 33.5* 35.6*  MCV 95.7 94.4 96.3 99.7  PLT 212 201 207 228   Basic Metabolic Panel: Recent Labs  Lab 01/19/17 1724 01/20/17 0744 01/21/17 0219 01/21/17 0744 01/22/17 0513  NA 134* 134* 137  --  138  K 3.9 4.8 5.4* 5.0 5.4*  CL 89* 90* 95*  --  94*  CO2 35* 35* 39*  --  37*  GLUCOSE 125* 156* 138*  --  126*  BUN 36* 35* 32*  --  24*  CREATININE 0.97 0.99 0.87  --  0.68  CALCIUM 8.9 8.5* 8.7*  --  8.8*  MG  --  2.7* 2.2  --  2.2   GFR: Estimated Creatinine  Clearance: 53.9 mL/min (by C-G formula based on SCr of 0.68 mg/dL). Liver Function Tests: Recent Labs  Lab 01/19/17 1724  AST 15  ALT 39  ALKPHOS 70  BILITOT 0.7  PROT 6.3*  ALBUMIN 3.2*   No results for input(s): LIPASE, AMYLASE in the last 168 hours. No results for input(s): AMMONIA in the last 168 hours. Coagulation Profile: No results for input(s): INR, PROTIME in the last 168 hours. Cardiac Enzymes: Recent Labs  Lab 01/19/17 1724  TROPONINI <0.03   BNP (last 3 results) No results for input(s): PROBNP in the last 8760 hours. HbA1C: No results for input(s): HGBA1C in the last 72 hours. CBG: Recent Labs  Lab 01/20/17 0826 01/21/17 0721 01/22/17 0723  GLUCAP 142* 164* 141*   Lipid Profile: No results for input(s): CHOL, HDL, LDLCALC, TRIG, CHOLHDL, LDLDIRECT in the last 72  hours. Thyroid Function Tests: No results for input(s): TSH, T4TOTAL, FREET4, T3FREE, THYROIDAB in the last 72 hours. Anemia Panel: No results for input(s): VITAMINB12, FOLATE, FERRITIN, TIBC, IRON, RETICCTPCT in the last 72 hours. Urine analysis:    Component Value Date/Time   COLORURINE YELLOW 04/16/2016 1920   APPEARANCEUR HAZY (A) 04/16/2016 1920   LABSPEC 1.006 04/16/2016 1920   PHURINE 6.0 04/16/2016 1920   GLUCOSEU NEGATIVE 04/16/2016 1920   HGBUR SMALL (A) 04/16/2016 1920   BILIRUBINUR neg 11/24/2016 1131   KETONESUR NEGATIVE 04/16/2016 1920   PROTEINUR neg 11/24/2016 1131   PROTEINUR NEGATIVE 04/16/2016 1920   UROBILINOGEN 0.2 11/24/2016 1131   NITRITE neg 11/24/2016 1131   NITRITE POSITIVE (A) 04/16/2016 1920   LEUKOCYTESUR Small (1+) (A) 11/24/2016 1131   Sepsis Labs: @LABRCNTIP (procalcitonin:4,lacticidven:4)  ) Recent Results (from the past 240 hour(s))  MRSA PCR Screening     Status: None   Collection Time: 01/20/17  1:15 AM  Result Value Ref Range Status   MRSA by PCR NEGATIVE NEGATIVE Final    Comment:        The GeneXpert MRSA Assay (FDA approved for NASAL specimens only), is one component of a comprehensive MRSA colonization surveillance program. It is not intended to diagnose MRSA infection nor to guide or monitor treatment for MRSA infections.   Respiratory Panel by PCR     Status: Abnormal   Collection Time: 01/20/17  9:31 AM  Result Value Ref Range Status   Adenovirus NOT DETECTED NOT DETECTED Final   Coronavirus 229E NOT DETECTED NOT DETECTED Final   Coronavirus HKU1 NOT DETECTED NOT DETECTED Final   Coronavirus NL63 NOT DETECTED NOT DETECTED Final   Coronavirus OC43 NOT DETECTED NOT DETECTED Final   Metapneumovirus NOT DETECTED NOT DETECTED Final   Rhinovirus / Enterovirus NOT DETECTED NOT DETECTED Final   Influenza A H1 2009 DETECTED (A) NOT DETECTED Final   Influenza B NOT DETECTED NOT DETECTED Final   Parainfluenza Virus 1 NOT DETECTED  NOT DETECTED Final   Parainfluenza Virus 2 NOT DETECTED NOT DETECTED Final   Parainfluenza Virus 3 NOT DETECTED NOT DETECTED Final   Parainfluenza Virus 4 NOT DETECTED NOT DETECTED Final   Respiratory Syncytial Virus NOT DETECTED NOT DETECTED Final   Bordetella pertussis NOT DETECTED NOT DETECTED Final   Chlamydophila pneumoniae NOT DETECTED NOT DETECTED Final   Mycoplasma pneumoniae NOT DETECTED NOT DETECTED Final  Culture, expectorated sputum-assessment     Status: None   Collection Time: 01/20/17  4:21 PM  Result Value Ref Range Status   Specimen Description EXPECTORATED SPUTUM  Final   Special Requests NONE  Final  Sputum evaluation THIS SPECIMEN IS ACCEPTABLE FOR SPUTUM CULTURE  Final   Report Status 01/20/2017 FINAL  Final  Culture, respiratory (NON-Expectorated)     Status: None (Preliminary result)   Collection Time: 01/20/17  4:21 PM  Result Value Ref Range Status   Specimen Description EXPECTORATED SPUTUM  Final   Special Requests NONE Reflexed from [id number]  Final   Gram Stain   Final    ABUNDANT WBC PRESENT, PREDOMINANTLY PMN RARE GRAM POSITIVE COCCI IN CHAINS RARE GRAM NEGATIVE COCCI IN PAIRS NO SQUAMOUS EPITHELIAL CELLS SEEN    Culture CULTURE REINCUBATED FOR BETTER GROWTH  Final   Report Status PENDING  Incomplete         Radiology Studies: No results found.      Scheduled Meds: . aspirin EC  81 mg Oral Daily  . busPIRone  15 mg Oral BID  . chlorhexidine  15 mL Mouth Rinse BID  . cholecalciferol  1,000 Units Oral Daily  . dextromethorphan-guaiFENesin  1 tablet Oral BID  . enoxaparin (LOVENOX) injection  40 mg Subcutaneous Q24H  . fluticasone furoate-vilanterol  1 puff Inhalation Daily  . ipratropium-albuterol  3 mL Nebulization QID  . mouth rinse  15 mL Mouth Rinse q12n4p  . methylPREDNISolone (SOLU-MEDROL) injection  60 mg Intravenous Q8H  . metoprolol tartrate  25 mg Oral BID  . multivitamin with minerals  1 tablet Oral Daily  . oseltamivir  30 mg  Oral BID  . sodium chloride flush  3 mL Intravenous Q12H   Continuous Infusions: . sodium chloride 50 mL/hr at 01/21/17 1930  . levofloxacin (LEVAQUIN) IV Stopped (01/20/17 1650)     LOS: 3 days    Time spent: 40 minutes    WOODS, Sheila MessierURTIS J, MD [facility] Pager [phone]   If 7PM-7AM, please contact night-coverage [email or web address] Password [email or web address] 01/22/2017, 11:07 AM

## 2017-01-22 NOTE — Progress Notes (Signed)
RT and RN talked pt into to wearing BIPAP again for increase WOB after pulling it off. ABG will be obtained at 1800.

## 2017-01-22 NOTE — Progress Notes (Signed)
Pt refused to wear BIPAP at this time. Pt pulled BIPAP OFF. RT placed pt on 3L [place] (home reg)

## 2017-01-22 NOTE — Progress Notes (Signed)
Neb tx and DPI done. Pt is refusing flutter and BIPAP at this time. RN notified

## 2017-01-22 NOTE — Plan of Care (Signed)
No  acute events at this time. 

## 2017-01-23 DIAGNOSIS — J09X1 Influenza due to identified novel influenza A virus with pneumonia: Secondary | ICD-10-CM

## 2017-01-23 LAB — CBC
HEMATOCRIT: 36.9 % (ref 36.0–46.0)
HEMOGLOBIN: 10.8 g/dL — AB (ref 12.0–15.0)
MCH: 28.6 pg (ref 26.0–34.0)
MCHC: 29.3 g/dL — AB (ref 30.0–36.0)
MCV: 97.9 fL (ref 78.0–100.0)
Platelets: 198 10*3/uL (ref 150–400)
RBC: 3.77 MIL/uL — AB (ref 3.87–5.11)
RDW: 15.1 % (ref 11.5–15.5)
WBC: 9.2 10*3/uL (ref 4.0–10.5)

## 2017-01-23 LAB — CULTURE, RESPIRATORY W GRAM STAIN: Culture: NORMAL

## 2017-01-23 LAB — LACTIC ACID, PLASMA: Lactic Acid, Venous: 0.9 mmol/L (ref 0.5–1.9)

## 2017-01-23 LAB — BASIC METABOLIC PANEL
ANION GAP: 9 (ref 5–15)
BUN: 24 mg/dL — ABNORMAL HIGH (ref 6–20)
CHLORIDE: 91 mmol/L — AB (ref 101–111)
CO2: 37 mmol/L — AB (ref 22–32)
Calcium: 8.7 mg/dL — ABNORMAL LOW (ref 8.9–10.3)
Creatinine, Ser: 0.69 mg/dL (ref 0.44–1.00)
GFR calc Af Amer: >60 mL/min (ref >60)
GFR calc non Af Amer: >60 mL/min (ref >60)
GLUCOSE: 167 mg/dL — AB (ref 65–99)
POTASSIUM: 4.4 mmol/L (ref 3.5–5.1)
Sodium: 137 mmol/L (ref 135–145)

## 2017-01-23 LAB — CULTURE, RESPIRATORY

## 2017-01-23 LAB — GLUCOSE, CAPILLARY: Glucose-Capillary: 127 mg/dL — ABNORMAL HIGH (ref 65–99)

## 2017-01-23 LAB — MAGNESIUM: Magnesium: 2 mg/dL (ref 1.7–2.4)

## 2017-01-23 MED ORDER — METOPROLOL TARTRATE 50 MG PO TABS
50.0000 mg | ORAL_TABLET | Freq: Two times a day (BID) | ORAL | Status: DC
  Administered 2017-01-23 – 2017-01-29 (×13): 50 mg via ORAL
  Filled 2017-01-23 (×14): qty 1

## 2017-01-23 MED ORDER — BUDESONIDE 0.5 MG/2ML IN SUSP
0.5000 mg | Freq: Two times a day (BID) | RESPIRATORY_TRACT | Status: DC
  Administered 2017-01-23 – 2017-02-05 (×26): 0.5 mg via RESPIRATORY_TRACT
  Filled 2017-01-23 (×26): qty 2

## 2017-01-23 MED ORDER — LISINOPRIL 5 MG PO TABS
5.0000 mg | ORAL_TABLET | Freq: Every day | ORAL | Status: DC
  Administered 2017-01-23 – 2017-01-25 (×3): 5 mg via ORAL
  Filled 2017-01-23 (×3): qty 1

## 2017-01-23 MED ORDER — HYDRALAZINE HCL 20 MG/ML IJ SOLN
5.0000 mg | INTRAMUSCULAR | Status: DC | PRN
  Administered 2017-01-23 – 2017-02-03 (×7): 5 mg via INTRAVENOUS
  Filled 2017-01-23 (×7): qty 1

## 2017-01-23 MED ORDER — METOPROLOL TARTRATE 5 MG/5ML IV SOLN
7.5000 mg | Freq: Once | INTRAVENOUS | Status: AC
  Administered 2017-01-23: 7.5 mg via INTRAVENOUS
  Filled 2017-01-23: qty 10

## 2017-01-23 MED ORDER — METOPROLOL TARTRATE 5 MG/5ML IV SOLN
7.5000 mg | Freq: Three times a day (TID) | INTRAVENOUS | Status: DC | PRN
  Administered 2017-01-23: 7.5 mg via INTRAVENOUS
  Filled 2017-01-23: qty 10

## 2017-01-23 NOTE — Progress Notes (Signed)
Pt BP increasing again: 184/88: [place], NP notified. No new orders given.   Patient tolerated bi pap well all last night with no other acute events.

## 2017-01-23 NOTE — Progress Notes (Signed)
PROGRESS NOTE    Sheila Pierce  ION:[id number] DOB: 09-05-1945 DOA: 01/19/2017 PCP: [name], MD   Brief Narrative:  71 y.o. WF PMHx   Anxiety, Depression, NSTEMI, Chronic Diastolic CHF, HTN, QT prolongation, HLD, COPD, Chronic resp failure on home O2 3 L via [place],Tobacco abuse, CKD, Diabetes mellitus without complication,  Anemia, Trigeminal neuralgia  Presenting to the emergency department for increased dyspnea.  Patient reports chronic shortness of breath, but notes that this has decreased markedly in recent days point where she is now unable to speak more than 1-2 words without gasping for air.  Denies fevers or chills and denies chest pain or palpitations.   ED Course: Upon arrival to the ED, patient is found to be afebrile, saturating adequately on 3 L/min of supplemental oxygen, tachypneic, tachycardic, and with stable blood pressure.  EKG features a sinus tachycardia with rate 110 and chest x-ray is negative for acute cardiopulmonary disease.  Chemistry panel reveals a slight hyponatremia carbonate of 35.  BUN to creatinine ratio is elevated.  CBC is unremarkable.  ABG reveals pH 7.26, pCO2 88, pO2 102.  Patient was treated with continuous albuterol neb, 2 g IV magnesium, 125 mg IV Solu-Medrol, and started on BiPAP.  She remains hemodynamically stable, remains markedly dyspneic, and will be admitted to the stepdown unit for ongoing evaluation and management of acute exacerbation in COPD.    Subjective: 12/30 A/O x4, understands she needs BiPAP at night and agrees to wear it.  Negative CP, positive SOB, negative abdominal pain      Assessment & Plan:   Active Problems:   CAD (coronary artery disease)   Anxiety   COPD exacerbation (HCC)   Chronic diastolic CHF (congestive heart failure) (HCC)   Acute on chronic respiratory failure with hypercapnia (HCC)   COPD with acute exacerbation (HCC)   Sepsis unspecified organism? -Patient meets criteria on admission HR> 90, RR>  20. In addition patient has lactic acidosis 2.3 -Normal saline 50 ml/hr -Complete 5 day course of levofloxacin -Lactic acid normalized.  positive influenza A pneumonia -Complete course of Tamiflu  Acute on Chronic Respiratory Failure with Hypoxia and hypercarbia/COPD exacerbation -DuoNeb QID -12/30 begin Pulmicort nebulizer - 12/30 discontinue Breo-Ellipta), patient's poor respiratory status will not allow her to control and sufficient amount of medication  -Solu-Medrol 60 mg TID -Mucinex DM -Flutter valve -BiPAP QHS and when sleeping  -RN instructions: ENSURE patient wears BiPAP notify on-call physician when BiPAP discontinued. -Discussed case with Dr. Molli Knock PC CM: Racemic Epi , usually not used in this case anymore however not contraindicated if no improvement in patients respiratory status may try . -Given that patient is making poor improvement in her respiratory status recommend racemic epi on 12/31  Chronic Diastolic CHF (base weight?) -EKG from 01/19/2017 compared to EKG 01/01/2017 no significant change. Sinus tachycardia -Strict in and out since admission +4.0 L -Daily weight Filed Weights   01/21/17 0500 01/22/17 0308 01/23/17 0426  Weight: 138 lb 14.2 oz (63 kg) 145 lb 11.6 oz (66.1 kg) 142 lb 10.2 oz (64.7 kg)  -12/30 start lisinopril 5 mg daily -12/30 increase Metoprolol 50 mg BID -12/30 increase metoprolol IV 7.5 mg PRN HR> 105  CAD  -Asymptomatic -ASA 81 mg daily -See CHF  Anxiety -BuSpar 15 mg daily -Ativan PRN     DVT prophylaxis: Lovenox Code Status: DNR Family Communication: None Disposition Plan: TBD   Consultants:  None   Procedures/Significant Events:  12/17/16 Echocardiogram: LVEF=:65% to 70%.-Grade 1 diastolic  dysfunction). - Atrial septum: There was increased thickness of the septum, c/w lipomatous hypertrophy.   -----------------------------------------------------------------------------------------------------------------------------------    I have personally reviewed and interpreted all radiology studies and my findings are as above.  VENTILATOR SETTINGS:    Cultures 12/27 MRSA by PCR negative 12/27 respiratory virus panel positive influenza A     Antimicrobials: Anti-infectives (From admission, onward)   Start     Stop   01/21/17 0945  oseltamivir (TAMIFLU) capsule 30 mg     01/26/17 0959   01/20/17 1200  levofloxacin (LEVAQUIN) IVPB 750 mg     01/24/17 2359       Devices  LINES / TUBES:      Continuous Infusions: . sodium chloride 50 mL/hr at 01/23/17 0429     Objective: Vitals:   01/23/17 0900 01/23/17 1000 01/23/17 1100 01/23/17 1150  BP: (!) 179/69 (!) 188/73 (!) 157/76   Pulse: 100 (!) 107 85   Resp: 20 (!) 21 19   Temp:      TempSrc:      SpO2: 100% 100% 100%   Weight:      Height:    5' (1.524 m)    Intake/Output Summary (Last 24 hours) at 01/23/2017 1208 Last data filed at 01/23/2017 0700 Gross per 24 hour  Intake 1555 ml  Output 500 ml  Net 1055 ml   Filed Weights   01/21/17 0500 01/22/17 0308 01/23/17 0426  Weight: 138 lb 14.2 oz (63 kg) 145 lb 11.6 oz (66.1 kg) 142 lb 10.2 oz (64.7 kg)    Physical Exam:  General: A/O x4, positive acute on chronic respiratory distress Neck:  Negative scars, masses, torticollis, lymphadenopathy, JVD Lungs: tachypnea, pursed lip breathing, diffuse poor air movement, positive diffuse expiratory wheezes, negative crackles Cardiovascular: Tachycardic, regular rhythm without murmur gallop or rub normal S1 and S2 Abdomen: Obese, negative  abdominal pain, nondistended, positive soft, bowel sounds, no rebound, no ascites, no appreciable mass Extremities: No significant cyanosis, clubbing, or edema bilateral lower extremities Skin: Negative rashes, lesions, ulcers Psychiatric:  Negative depression, positive  anxiety, negative fatigue, negative mania  Central nervous system:  Cranial nerves II through XII intact, tongue/uvula midline, all extremities muscle strength 5/5, sensation intact throughout, negative dysarthria, negative expressive aphasia, negative receptive aphasia.    Data Reviewed: Care during the described time interval was provided by me .  I have reviewed this patient's available data, including medical history, events of note, physical examination, and all test results as part of my evaluation.   CBC: Recent Labs  Lab 01/19/17 1724 01/20/17 0744 01/21/17 0219 01/22/17 0513 01/23/17 0514  WBC 9.8 10.0 18.7* 20.9* 9.2  NEUTROABS 7.5  --   --   --   --   HGB 11.5* 10.6* 10.1* 10.4* 10.8*  HCT 37.7 35.1* 33.5* 35.6* 36.9  MCV 95.7 94.4 96.3 99.7 97.9  PLT 212 201 207 228 198   Basic Metabolic Panel: Recent Labs  Lab 01/19/17 1724 01/20/17 0744 01/21/17 0219 01/21/17 0744 01/22/17 0513 01/23/17 0514  NA 134* 134* 137  --  138 137  K 3.9 4.8 5.4* 5.0 5.4* 4.4  CL 89* 90* 95*  --  94* 91*  CO2 35* 35* 39*  --  37* 37*  GLUCOSE 125* 156* 138*  --  126* 167*  BUN 36* 35* 32*  --  24* 24*  CREATININE 0.97 0.99 0.87  --  0.68 0.69  CALCIUM 8.9 8.5* 8.7*  --  8.8* 8.7*  MG  --  2.7* 2.2  --  2.2 2.0   GFR: Estimated Creatinine Clearance: 53.4 mL/min (by C-G formula based on SCr of 0.69 mg/dL). Liver Function Tests: Recent Labs  Lab 01/19/17 1724  AST 15  ALT 39  ALKPHOS 70  BILITOT 0.7  PROT 6.3*  ALBUMIN 3.2*   No results for input(s): LIPASE, AMYLASE in the last 168 hours. No results for input(s): AMMONIA in the last 168 hours. Coagulation Profile: No results for input(s): INR, PROTIME in the last 168 hours. Cardiac Enzymes: Recent Labs  Lab 01/19/17 1724  TROPONINI <0.03   BNP (last 3 results) No results for input(s): PROBNP in the last 8760 hours. HbA1C: No results for input(s): HGBA1C in the last 72 hours. CBG: Recent Labs  Lab 01/20/17 0826  01/21/17 0721 01/22/17 0723 01/23/17 0736  GLUCAP 142* 164* 141* 127*   Lipid Profile: No results for input(s): CHOL, HDL, LDLCALC, TRIG, CHOLHDL, LDLDIRECT in the last 72 hours. Thyroid Function Tests: No results for input(s): TSH, T4TOTAL, FREET4, T3FREE, THYROIDAB in the last 72 hours. Anemia Panel: No results for input(s): VITAMINB12, FOLATE, FERRITIN, TIBC, IRON, RETICCTPCT in the last 72 hours. Urine analysis:    Component Value Date/Time   COLORURINE YELLOW 04/16/2016 1920   APPEARANCEUR HAZY (A) 04/16/2016 1920   LABSPEC 1.006 04/16/2016 1920   PHURINE 6.0 04/16/2016 1920   GLUCOSEU NEGATIVE 04/16/2016 1920   HGBUR SMALL (A) 04/16/2016 1920   BILIRUBINUR neg 11/24/2016 1131   KETONESUR NEGATIVE 04/16/2016 1920   PROTEINUR neg 11/24/2016 1131   PROTEINUR NEGATIVE 04/16/2016 1920   UROBILINOGEN 0.2 11/24/2016 1131   NITRITE neg 11/24/2016 1131   NITRITE POSITIVE (A) 04/16/2016 1920   LEUKOCYTESUR Small (1+) (A) 11/24/2016 1131   Sepsis Labs: @LABRCNTIP (procalcitonin:4,lacticidven:4)  ) Recent Results (from the past 240 hour(s))  MRSA PCR Screening     Status: None   Collection Time: 01/20/17  1:15 AM  Result Value Ref Range Status   MRSA by PCR NEGATIVE NEGATIVE Final    Comment:        The GeneXpert MRSA Assay (FDA approved for NASAL specimens only), is one component of a comprehensive MRSA colonization surveillance program. It is not intended to diagnose MRSA infection nor to guide or monitor treatment for MRSA infections.   Respiratory Panel by PCR     Status: Abnormal   Collection Time: 01/20/17  9:31 AM  Result Value Ref Range Status   Adenovirus NOT DETECTED NOT DETECTED Final   Coronavirus 229E NOT DETECTED NOT DETECTED Final   Coronavirus HKU1 NOT DETECTED NOT DETECTED Final   Coronavirus NL63 NOT DETECTED NOT DETECTED Final   Coronavirus OC43 NOT DETECTED NOT DETECTED Final   Metapneumovirus NOT DETECTED NOT DETECTED Final   Rhinovirus /  Enterovirus NOT DETECTED NOT DETECTED Final   Influenza A H1 2009 DETECTED (A) NOT DETECTED Final   Influenza B NOT DETECTED NOT DETECTED Final   Parainfluenza Virus 1 NOT DETECTED NOT DETECTED Final   Parainfluenza Virus 2 NOT DETECTED NOT DETECTED Final   Parainfluenza Virus 3 NOT DETECTED NOT DETECTED Final   Parainfluenza Virus 4 NOT DETECTED NOT DETECTED Final   Respiratory Syncytial Virus NOT DETECTED NOT DETECTED Final   Bordetella pertussis NOT DETECTED NOT DETECTED Final   Chlamydophila pneumoniae NOT DETECTED NOT DETECTED Final   Mycoplasma pneumoniae NOT DETECTED NOT DETECTED Final  Culture, expectorated sputum-assessment     Status: None   Collection Time: 01/20/17  4:21 PM  Result Value Ref Range Status  Specimen Description EXPECTORATED SPUTUM  Final   Special Requests NONE  Final   Sputum evaluation THIS SPECIMEN IS ACCEPTABLE FOR SPUTUM CULTURE  Final   Report Status 01/20/2017 FINAL  Final  Culture, respiratory (NON-Expectorated)     Status: None   Collection Time: 01/20/17  4:21 PM  Result Value Ref Range Status   Specimen Description EXPECTORATED SPUTUM  Final   Special Requests NONE Reflexed from [id number]  Final   Gram Stain   Final    ABUNDANT WBC PRESENT, PREDOMINANTLY PMN RARE GRAM POSITIVE COCCI IN CHAINS RARE GRAM NEGATIVE COCCI IN PAIRS NO SQUAMOUS EPITHELIAL CELLS SEEN    Culture Consistent with normal respiratory flora.  Final   Report Status 01/23/2017 FINAL  Final         Radiology Studies: Dg Chest Port 1 View  Result Date: 01/22/2017 CLINICAL DATA:  Shortness of breath. EXAM: PORTABLE CHEST 1 VIEW COMPARISON:  Chest x-ray dated January 19, 2017. FINDINGS: The cardiomediastinal silhouette is normal in size. Normal pulmonary vascularity. Atherosclerotic calcification of the aortic arch. Stable emphysematous changes in both lungs, with bibasilar atelectasis/ scarring and coarsened interstitial markings. No focal consolidation, pleural effusion, or  pneumothorax. No acute osseous abnormality. IMPRESSION: COPD.  No active cardiopulmonary disease. Electronically Signed   By: Obie Dredge M.D.   On: 01/22/2017 11:45        Scheduled Meds: . aspirin EC  81 mg Oral Daily  . busPIRone  15 mg Oral BID  . chlorhexidine  15 mL Mouth Rinse BID  . cholecalciferol  1,000 Units Oral Daily  . dextromethorphan-guaiFENesin  1 tablet Oral BID  . enoxaparin (LOVENOX) injection  40 mg Subcutaneous Q24H  . fluticasone furoate-vilanterol  1 puff Inhalation Daily  . ipratropium-albuterol  3 mL Nebulization QID  . levofloxacin  750 mg Oral Q48H  . mouth rinse  15 mL Mouth Rinse q12n4p  . methylPREDNISolone (SOLU-MEDROL) injection  60 mg Intravenous Q8H  . metoprolol tartrate  25 mg Oral BID  . multivitamin with minerals  1 tablet Oral Daily  . oseltamivir  30 mg Oral BID  . sodium chloride flush  3 mL Intravenous Q12H   Continuous Infusions: . sodium chloride 50 mL/hr at 01/23/17 0429     LOS: 4 days    Time spent: 40 minutes    Achillies Buehl, Roselind Messier, MD [facility] Pager [phone]   If 7PM-7AM, please contact night-coverage [email or web address] Password [email or web address] 01/23/2017, 12:08 PM

## 2017-01-24 DIAGNOSIS — J9601 Acute respiratory failure with hypoxia: Secondary | ICD-10-CM

## 2017-01-24 DIAGNOSIS — J441 Chronic obstructive pulmonary disease with (acute) exacerbation: Secondary | ICD-10-CM

## 2017-01-24 DIAGNOSIS — J9602 Acute respiratory failure with hypercapnia: Secondary | ICD-10-CM

## 2017-01-24 DIAGNOSIS — I5032 Chronic diastolic (congestive) heart failure: Secondary | ICD-10-CM

## 2017-01-24 LAB — BASIC METABOLIC PANEL
Anion gap: 9 (ref 5–15)
BUN: 25 mg/dL — AB (ref 6–20)
CO2: 34 mmol/L — AB (ref 22–32)
CREATININE: 0.63 mg/dL (ref 0.44–1.00)
Calcium: 8.6 mg/dL — ABNORMAL LOW (ref 8.9–10.3)
Chloride: 95 mmol/L — ABNORMAL LOW (ref 101–111)
GFR calc Af Amer: >60 mL/min (ref >60)
GFR calc non Af Amer: >60 mL/min (ref >60)
GLUCOSE: 127 mg/dL — AB (ref 65–99)
POTASSIUM: 4.4 mmol/L (ref 3.5–5.1)
Sodium: 138 mmol/L (ref 135–145)

## 2017-01-24 LAB — CBC
HEMATOCRIT: 36.3 % (ref 36.0–46.0)
Hemoglobin: 11.3 g/dL — ABNORMAL LOW (ref 12.0–15.0)
MCH: 29.6 pg (ref 26.0–34.0)
MCHC: 31.1 g/dL (ref 30.0–36.0)
MCV: 95 fL (ref 78.0–100.0)
PLATELETS: 163 10*3/uL (ref 150–400)
RBC: 3.82 MIL/uL — ABNORMAL LOW (ref 3.87–5.11)
RDW: 15.3 % (ref 11.5–15.5)
WBC: 8.7 10*3/uL (ref 4.0–10.5)

## 2017-01-24 LAB — MAGNESIUM: Magnesium: 1.9 mg/dL (ref 1.7–2.4)

## 2017-01-24 LAB — GLUCOSE, CAPILLARY: Glucose-Capillary: 123 mg/dL — ABNORMAL HIGH (ref 65–99)

## 2017-01-24 MED ORDER — DM-GUAIFENESIN ER 30-600 MG PO TB12
1.0000 | ORAL_TABLET | Freq: Two times a day (BID) | ORAL | Status: DC | PRN
  Administered 2017-01-25 – 2017-02-04 (×3): 1 via ORAL
  Filled 2017-01-24 (×5): qty 1

## 2017-01-24 MED ORDER — METHYLPREDNISOLONE SODIUM SUCC 125 MG IJ SOLR
60.0000 mg | Freq: Three times a day (TID) | INTRAMUSCULAR | Status: AC
  Administered 2017-01-24: 60 mg via INTRAVENOUS
  Filled 2017-01-24: qty 2

## 2017-01-24 MED ORDER — IPRATROPIUM-ALBUTEROL 0.5-2.5 (3) MG/3ML IN SOLN
3.0000 mL | RESPIRATORY_TRACT | Status: DC | PRN
  Administered 2017-01-24: 3 mL via RESPIRATORY_TRACT
  Filled 2017-01-24: qty 3

## 2017-01-24 MED ORDER — IPRATROPIUM-ALBUTEROL 0.5-2.5 (3) MG/3ML IN SOLN
3.0000 mL | Freq: Three times a day (TID) | RESPIRATORY_TRACT | Status: DC
  Administered 2017-01-24 – 2017-02-01 (×24): 3 mL via RESPIRATORY_TRACT
  Filled 2017-01-24 (×24): qty 3

## 2017-01-24 MED ORDER — PREDNISONE 20 MG PO TABS
40.0000 mg | ORAL_TABLET | Freq: Two times a day (BID) | ORAL | Status: DC
  Administered 2017-01-25 – 2017-01-28 (×7): 40 mg via ORAL
  Filled 2017-01-24 (×8): qty 2

## 2017-01-24 NOTE — Progress Notes (Signed)
Initial visit to see patient.  Gave AD forms and offered explanation.  Patient would like time to read and consider what to do.  Advised her to have chaplain called if she would like assistance with forms or has questions. Phebe Colla, Chaplain   01/24/17 1400  Clinical Encounter Type  Visited With Patient  Visit Type Initial;Other (Comment) (AD Forms shared with patients)  Consult/Referral To Chaplain  Recommendations (AD Forms recommended)

## 2017-01-24 NOTE — Progress Notes (Addendum)
[facility] TEAM 1 - Stepdown/ICU TEAM  Sheila Pierce  ZOX:[id number] DOB: [birth year] DOA: 01/19/2017 PCP: [name], MD    Brief Narrative:  71 y.o.F Hx  Anxiety, Depression, NSTEMI, Chronic Diastolic CHF, HTN, QT prolongation, HLD, COPD, Chronic resp failure on home O2 3 L via , Tobacco abuse, CKD, DM, Anemia, and Trigeminal neuralgia who presented to the ED w/ dyspnea, unable to speak more than 1-2 words without gasping for air.   In the ED her ABG revealed pH 7.26, pCO288,pO2102. Patient was treated with continuous albuterol neb, 2 g IV magnesium, 125 mg IV Solu-Medrol, and started on BiPAP.   Significant Events: 12/26 admit  Subjective: The patient states she is feeling much better and less short of breath.  She denies chest pain fevers chills nausea vomiting.  She is anxious to get home.  Assessment & Plan:  Sepsis due to Influenza A Complete course of Tamiflu -clinically improving  Acute hypercarbic resp failure on chronic hypoxic resp failure -acute COPD exacerbation Slowly improving but still in mild respiratory distress today - begin to ambulate  Chronic grade 1 diastolic CHF EF 65-70% via TTE Nov 2018 - no significant volume overload on exam at this time  CAD Asymptomatic  Anxiety  Well controlled at this time  DVT prophylaxis: lovenox  Code Status: DNR - NO CODE Family Communication: no family present at time of exam  Disposition Plan:   Consultants:  none  Antimicrobials:  none  Objective: Blood pressure (!) 164/88, pulse 98, temperature 98.8 F (37.1 C), temperature source Oral, resp. rate (!) 21, height 5' (1.524 m), weight 63.3 kg (139 lb 8.8 oz), SpO2 100 %.  Intake/Output Summary (Last 24 hours) at 01/24/2017 1612 Last data filed at 01/24/2017 1200 Gross per 24 hour  Intake 1443 ml  Output 800 ml  Net 643 ml   Filed Weights   01/23/17 0426 01/24/17 0608 01/24/17 0635  Weight: 64.7 kg (142 lb 10.2 oz) 63 kg (138 lb 14.2 oz)  63.3 kg (139 lb 8.8 oz)    Examination: General: very mild resp distress  Lungs: mid exp wheezing - no focal crackles  Cardiovascular: Regular rate and rhythm without murmur  Abdomen: Nontender, nondistended, soft, bowel sounds positive, no rebound, no ascites, no appreciable mass Extremities: No significant cyanosis, clubbing, or edema bilateral lower extremities  CBC: Recent Labs  Lab 01/19/17 1724 01/20/17 0744 01/21/17 0219 01/22/17 0513 01/23/17 0514 01/24/17 0412  WBC 9.8 10.0 18.7* 20.9* 9.2 8.7  NEUTROABS 7.5  --   --   --   --   --   HGB 11.5* 10.6* 10.1* 10.4* 10.8* 11.3*  HCT 37.7 35.1* 33.5* 35.6* 36.9 36.3  MCV 95.7 94.4 96.3 99.7 97.9 95.0  PLT 212 201 207 228 198 163   Basic Metabolic Panel: Recent Labs  Lab 01/20/17 0744 01/21/17 0219 01/21/17 0744 01/22/17 0513 01/23/17 0514 01/24/17 0412  NA 134* 137  --  138 137 138  K 4.8 5.4* 5.0 5.4* 4.4 4.4  CL 90* 95*  --  94* 91* 95*  CO2 35* 39*  --  37* 37* 34*  GLUCOSE 156* 138*  --  126* 167* 127*  BUN 35* 32*  --  24* 24* 25*  CREATININE 0.99 0.87  --  0.68 0.69 0.63  CALCIUM 8.5* 8.7*  --  8.8* 8.7* 8.6*  MG 2.7* 2.2  --  2.2 2.0 1.9   GFR: Estimated Creatinine Clearance: 52.8 mL/min (by C-G formula based on  SCr of 0.63 mg/dL).  Liver Function Tests: Recent Labs  Lab 01/19/17 1724  AST 15  ALT 39  ALKPHOS 70  BILITOT 0.7  PROT 6.3*  ALBUMIN 3.2*   Cardiac Enzymes: Recent Labs  Lab 01/19/17 1724  TROPONINI <0.03    HbA1C: Hgb A1c MFr Bld  Date/Time Value Ref Range Status  12/17/2016 05:53 AM 5.5 4.8 - 5.6 % Final    Comment:    (NOTE)         Prediabetes: 5.7 - 6.4         Diabetes: >6.4         Glycemic control for adults with diabetes: <7.0   11/11/2016 09:44 AM 5.2 4.8 - 5.6 % Final    Comment:             Prediabetes: 5.7 - 6.4          Diabetes: >6.4          Glycemic control for adults with diabetes: <7.0     CBG: Recent Labs  Lab 01/20/17 0826 01/21/17 0721  01/22/17 0723 01/23/17 0736 01/24/17 0838  GLUCAP 142* 164* 141* 127* 123*    Recent Results (from the past 240 hour(s))  MRSA PCR Screening     Status: None   Collection Time: 01/20/17  1:15 AM  Result Value Ref Range Status   MRSA by PCR NEGATIVE NEGATIVE Final    Comment:        The GeneXpert MRSA Assay (FDA approved for NASAL specimens only), is one component of a comprehensive MRSA colonization surveillance program. It is not intended to diagnose MRSA infection nor to guide or monitor treatment for MRSA infections.   Respiratory Panel by PCR     Status: Abnormal   Collection Time: 01/20/17  9:31 AM  Result Value Ref Range Status   Adenovirus NOT DETECTED NOT DETECTED Final   Coronavirus 229E NOT DETECTED NOT DETECTED Final   Coronavirus HKU1 NOT DETECTED NOT DETECTED Final   Coronavirus NL63 NOT DETECTED NOT DETECTED Final   Coronavirus OC43 NOT DETECTED NOT DETECTED Final   Metapneumovirus NOT DETECTED NOT DETECTED Final   Rhinovirus / Enterovirus NOT DETECTED NOT DETECTED Final   Influenza A H1 2009 DETECTED (A) NOT DETECTED Final   Influenza B NOT DETECTED NOT DETECTED Final   Parainfluenza Virus 1 NOT DETECTED NOT DETECTED Final   Parainfluenza Virus 2 NOT DETECTED NOT DETECTED Final   Parainfluenza Virus 3 NOT DETECTED NOT DETECTED Final   Parainfluenza Virus 4 NOT DETECTED NOT DETECTED Final   Respiratory Syncytial Virus NOT DETECTED NOT DETECTED Final   Bordetella pertussis NOT DETECTED NOT DETECTED Final   Chlamydophila pneumoniae NOT DETECTED NOT DETECTED Final   Mycoplasma pneumoniae NOT DETECTED NOT DETECTED Final  Culture, expectorated sputum-assessment     Status: None   Collection Time: 01/20/17  4:21 PM  Result Value Ref Range Status   Specimen Description EXPECTORATED SPUTUM  Final   Special Requests NONE  Final   Sputum evaluation THIS SPECIMEN IS ACCEPTABLE FOR SPUTUM CULTURE  Final   Report Status 01/20/2017 FINAL  Final  Culture, respiratory  (NON-Expectorated)     Status: None   Collection Time: 01/20/17  4:21 PM  Result Value Ref Range Status   Specimen Description EXPECTORATED SPUTUM  Final   Special Requests NONE Reflexed from [id number]  Final   Gram Stain   Final    ABUNDANT WBC PRESENT, PREDOMINANTLY PMN RARE GRAM POSITIVE COCCI IN CHAINS RARE GRAM  NEGATIVE COCCI IN PAIRS NO SQUAMOUS EPITHELIAL CELLS SEEN    Culture Consistent with normal respiratory flora.  Final   Report Status 01/23/2017 FINAL  Final     Scheduled Meds: . aspirin EC  81 mg Oral Daily  . budesonide (PULMICORT) nebulizer solution  0.5 mg Nebulization BID  . busPIRone  15 mg Oral BID  . chlorhexidine  15 mL Mouth Rinse BID  . cholecalciferol  1,000 Units Oral Daily  . dextromethorphan-guaiFENesin  1 tablet Oral BID  . enoxaparin (LOVENOX) injection  40 mg Subcutaneous Q24H  . ipratropium-albuterol  3 mL Nebulization TID  . levofloxacin  750 mg Oral Q48H  . lisinopril  5 mg Oral Daily  . mouth rinse  15 mL Mouth Rinse q12n4p  . methylPREDNISolone (SOLU-MEDROL) injection  60 mg Intravenous Q8H  . metoprolol tartrate  50 mg Oral BID  . multivitamin with minerals  1 tablet Oral Daily  . oseltamivir  30 mg Oral BID  . sodium chloride flush  3 mL Intravenous Q12H     LOS: 5 days   Lonia BloodJeffrey T. Nolia Tschantz, MD [facility] Office  [phone] Pager - Text Page per Loretha StaplerAmion as per below:  On-Call/Text Page:      [email or web address]      password [email or web address]  If 7PM-7AM, please contact night-coverage [email or web address] Password [email or web address] 01/24/2017, 4:12 PM

## 2017-01-24 NOTE — Progress Notes (Signed)
Physical Therapy Treatment Patient Details Name: Sheila Pierce MRN: [id number] DOB: 06/20/1945 Today's Date: 01/24/2017    History of Present Illness Pt is a 71 y/o female with medical history significant for chronic diastolic CHF, anxiety, and COPD with chronic hypercarbic and hypoxic respiratory failure, now presenting to the emergency department for increased dyspnea secondary to a COPD exacerbation    PT Comments    Pt admitted with above diagnosis. Pt currently with functional limitations due to balance and endurance deficits. Pt was able to transfer to 3n1 and then to  chair with mod assist of 2 persons. Pt needed cues for pursed lip breathing.  Pt made comfortable in the chair.  Nurse aware of how pt transferred.  Pt will benefit from skilled PT to increase their independence and safety with mobility to allow discharge to the venue listed below.     Follow Up Recommendations  SNF;Supervision/Assistance - 24 hour     Equipment Recommendations  None recommended by PT    Recommendations for Other Services       Precautions / Restrictions Precautions:  DROPLET Precautions: Fall Precaution Comments: watch SPO2 Restrictions Weight Bearing Restrictions: No    Mobility  Bed Mobility Overal bed mobility: Needs Assistance Bed Mobility: Rolling;Sidelying to Sit Rolling: Min guard Sidelying to sit: Mod assist       General bed mobility comments: Needed mod assist to bring LEs off bed and elevation of trunk.  Transfers Overall transfer level: Needs assistance Equipment used: 2 person hand held assist Transfers: Sit to/from [employer] CorporationStand;Stand Pivot Transfers Sit to Stand: Mod assist;+2 physical assistance Stand pivot transfers: Mod assist;+2 physical assistance       General transfer comment: No sooner had pt sat up when she had to urinate.  Assisted pt to 3n1 and pt needed mod assist to power up and to take pivotal steps to chair with pt sitting abruptly onto 3N1. then pt needed  assist to wipe as she could not wipe herself.  Pt then transferred stand pivot with  mod assist to recliner.  Pt barely able to stand long enough to pivot and collapses in to chair needed controlled descent.   Ambulation/Gait                 Stairs            Wheelchair Mobility    Modified Rankin (Stroke Patients Only)       Balance Overall balance assessment: Needs assistance Sitting-balance support: Bilateral upper extremity supported;Feet supported Sitting balance-Leahy Scale: Fair     Standing balance support: Bilateral upper extremity supported;During functional activity Standing balance-Leahy Scale: Poor Standing balance comment: relies on Bil UE support                            Cognition Arousal/Alertness: Awake/alert Behavior During Therapy: Anxious Overall Cognitive Status: No family/caregiver present to determine baseline cognitive functioning                                 General Comments: cognition not formally assessed but [facility] for general conversation      Exercises      General Comments        Pertinent Vitals/Pain Pain Assessment: Faces Faces Pain Scale: Hurts even more Pain Location: generalized Pain Descriptors / Indicators: Aching;Grimacing;Guarding Pain Intervention(s): Limited activity within patient's tolerance;Monitored during session;Repositioned   VSS  Home  Living                      Prior Function            PT Goals (current goals can now be found in the care plan section) Acute Rehab PT Goals Patient Stated Goal: to feel better Progress towards PT goals: Progressing toward goals    Frequency    Min 2X/week      PT Plan Current plan remains appropriate;Frequency needs to be updated    Co-evaluation              AM-PAC PT "6 Clicks" Daily Activity  Outcome Measure  Difficulty turning over in bed (including adjusting bedclothes, sheets and blankets)?:  Unable Difficulty moving from lying on back to sitting on the side of the bed? : Unable Difficulty sitting down on and standing up from a chair with arms (e.g., wheelchair, bedside commode, etc,.)?: Unable Help needed moving to and from a bed to chair (including a wheelchair)?: Total Help needed walking in hospital room?: Total Help needed climbing 3-5 steps with a railing? : Total 6 Click Score: 6    End of Session Equipment Utilized During Treatment: Oxygen;Gait belt Activity Tolerance: Patient limited by fatigue(self limiting) Patient left: with call bell/phone within reach;in chair;with chair alarm set Nurse Communication: Mobility status(to replace purewick) PT Visit Diagnosis: Other abnormalities of gait and mobility (R26.89)     Time: 1131-1200 PT Time Calculation (min) (ACUTE ONLY): 29 min  Charges:  $Therapeutic Activity: 23-37 mins                    G Codes:       Bion Todorov,PT Acute Rehabilitation [phone] (pager)    Sheila Pierce 01/24/2017, 12:47 PM

## 2017-01-24 NOTE — Plan of Care (Signed)
Coached pt to slow her breathing and breath in through her nose.  Will continue to monitor and coach pt as needed.

## 2017-01-25 LAB — BASIC METABOLIC PANEL
Anion gap: 6 (ref 5–15)
BUN: 25 mg/dL — AB (ref 6–20)
CHLORIDE: 89 mmol/L — AB (ref 101–111)
CO2: 39 mmol/L — AB (ref 22–32)
CREATININE: 0.62 mg/dL (ref 0.44–1.00)
Calcium: 8.9 mg/dL (ref 8.9–10.3)
GFR calc Af Amer: >60 mL/min (ref >60)
GFR calc non Af Amer: >60 mL/min (ref >60)
GLUCOSE: 109 mg/dL — AB (ref 65–99)
Potassium: 4.1 mmol/L (ref 3.5–5.1)
Sodium: 134 mmol/L — ABNORMAL LOW (ref 135–145)

## 2017-01-25 LAB — CBC
HCT: 34.9 % — ABNORMAL LOW (ref 36.0–46.0)
Hemoglobin: 10.6 g/dL — ABNORMAL LOW (ref 12.0–15.0)
MCH: 28.9 pg (ref 26.0–34.0)
MCHC: 30.4 g/dL (ref 30.0–36.0)
MCV: 95.1 fL (ref 78.0–100.0)
PLATELETS: 173 10*3/uL (ref 150–400)
RBC: 3.67 MIL/uL — AB (ref 3.87–5.11)
RDW: 15.1 % (ref 11.5–15.5)
WBC: 8.5 10*3/uL (ref 4.0–10.5)

## 2017-01-25 LAB — GLUCOSE, CAPILLARY: Glucose-Capillary: 112 mg/dL — ABNORMAL HIGH (ref 65–99)

## 2017-01-25 MED ORDER — ALBUTEROL SULFATE (2.5 MG/3ML) 0.083% IN NEBU
2.5000 mg | INHALATION_SOLUTION | RESPIRATORY_TRACT | Status: DC | PRN
  Filled 2017-01-25: qty 3

## 2017-01-25 MED ORDER — METOPROLOL TARTRATE 25 MG PO TABS: 25.0000 mg | ORAL_TABLET | Freq: Two times a day (BID) | ORAL | Status: DC

## 2017-01-25 MED ORDER — POTASSIUM CHLORIDE CRYS ER 10 MEQ PO TBCR
10.0000 meq | EXTENDED_RELEASE_TABLET | Freq: Every day | ORAL | Status: DC
  Administered 2017-01-25 – 2017-01-29 (×5): 10 meq via ORAL
  Filled 2017-01-25 (×9): qty 1

## 2017-01-25 MED ORDER — FUROSEMIDE 20 MG PO TABS
20.0000 mg | ORAL_TABLET | Freq: Every day | ORAL | Status: DC
  Administered 2017-01-25 – 2017-01-29 (×5): 20 mg via ORAL
  Filled 2017-01-25 (×5): qty 1

## 2017-01-25 MED ORDER — LISINOPRIL 10 MG PO TABS
10.0000 mg | ORAL_TABLET | Freq: Every day | ORAL | Status: DC
  Administered 2017-01-26 – 2017-01-29 (×4): 10 mg via ORAL
  Filled 2017-01-25 (×4): qty 1

## 2017-01-25 MED ORDER — MORPHINE SULFATE (PF) 4 MG/ML IV SOLN: 2.0000 mg | Freq: Once | INTRAVENOUS | Status: DC

## 2017-01-25 NOTE — Progress Notes (Signed)
Performed bedside report with day shift nurse Kristi. During shift changed patient requested to change code status from DNR to Full Code. Kirtland Bouchard Schorr paged and notified of current change in status. Will continue to monitor and assess patient.

## 2017-01-25 NOTE — Progress Notes (Addendum)
[facility] TEAM 1 - Stepdown/ICU TEAM  Sheila Pierce  PRX:[id number] DOB: 03/05/1945 DOA: 01/19/2017 PCP: [name], MD    Brief Narrative:  72 y.o.F Hx  Anxiety, Depression, NSTEMI, Chronic Diastolic CHF, HTN, QT prolongation, HLD, COPD, Chronic resp failure on home O2 3 L via [place], Tobacco abuse, CKD, DM, Anemia, and Trigeminal neuralgia who presented to the ED w/ dyspnea, unable to speak more than 1-2 words without gasping for air.   In the ED her ABG revealed pH 7.26, pCO288,pO2102. Patient was treated with continuous albuterol neb, 2 g IV magnesium, 125 mg IV Solu-Medrol, and started on BiPAP.   Significant Events: 12/26 admit  Subjective: Reports feeling "a bit worse" today.  C/o modestly increased sob.  Relates she has very little energy and feels weak in general.  She now states she is willing be placed in a SNF for the sake of rehabilitation.   Assessment & Plan:  Sepsis due to Influenza A Complete course of Tamiflu - clinically improving  Acute hypercarbic resp failure on chronic hypoxic resp failure - acute COPD exacerbation Slowly improving - no signif distress at this time - cont to slowly wean steroids - increase activity   Chronic grade 1 diastolic CHF EF 65-70% via TTE Nov 2018 - no significant volume overload on exam at this time - resume home medical regimen   Filed Weights   01/23/17 0426 01/24/17 0608 01/24/17 0635  Weight: 64.7 kg (142 lb 10.2 oz) 63 kg (138 lb 14.2 oz) 63.3 kg (139 lb 8.8 oz)   CAD Asymptomatic  Anxiety  Well controlled at this time  DVT prophylaxis: lovenox  Code Status: DNR - NO CODE Family Communication: no family present at time of exam  Disposition Plan: transfer to tele bed - should be ready for SNF in ~24hrs - lives in [place] so prefers SNF in [place] or [place]   Consultants:  none  Antimicrobials:  none  Objective: Blood pressure (!) 151/66, pulse 85, temperature 98 F (36.7 C), temperature source Oral,  resp. rate 19, height 5' (1.524 m), weight 63.3 kg (139 lb 8.8 oz), SpO2 96 %.  Intake/Output Summary (Last 24 hours) at 01/25/2017 1210 Last data filed at 01/25/2017 0900 Gross per 24 hour  Intake 483 ml  Output 500 ml  Net -17 ml   Filed Weights   01/23/17 0426 01/24/17 0608 01/24/17 0635  Weight: 64.7 kg (142 lb 10.2 oz) 63 kg (138 lb 14.2 oz) 63.3 kg (139 lb 8.8 oz)    Examination: General: no acute distress at this time  Lungs: no wheezing - very poor air movement th/o all fields   Cardiovascular: Regular rate and rhythm - no M or rub  Abdomen: NT/ND, soft, bs+, no mass, no rebound  Extremities: No edema bilateral lower extremities  CBC: Recent Labs  Lab 01/19/17 1724  01/21/17 0219 01/22/17 0513 01/23/17 0514 01/24/17 0412 01/25/17 0429  WBC 9.8   < > 18.7* 20.9* 9.2 8.7 8.5  NEUTROABS 7.5  --   --   --   --   --   --   HGB 11.5*   < > 10.1* 10.4* 10.8* 11.3* 10.6*  HCT 37.7   < > 33.5* 35.6* 36.9 36.3 34.9*  MCV 95.7   < > 96.3 99.7 97.9 95.0 95.1  PLT 212   < > 207 228 198 163 173   < > = values in this interval not displayed.   Basic Metabolic Panel: Recent Labs  Lab 01/20/17 0744 01/21/17 0219 01/21/17 0744 01/22/17 0513 01/23/17 0514 01/24/17 0412 01/25/17 0429  NA 134* 137  --  138 137 138 134*  K 4.8 5.4* 5.0 5.4* 4.4 4.4 4.1  CL 90* 95*  --  94* 91* 95* 89*  CO2 35* 39*  --  37* 37* 34* 39*  GLUCOSE 156* 138*  --  126* 167* 127* 109*  BUN 35* 32*  --  24* 24* 25* 25*  CREATININE 0.99 0.87  --  0.68 0.69 0.63 0.62  CALCIUM 8.5* 8.7*  --  8.8* 8.7* 8.6* 8.9  MG 2.7* 2.2  --  2.2 2.0 1.9  --    GFR: Estimated Creatinine Clearance: 52.8 mL/min (by C-G formula based on SCr of 0.62 mg/dL).  Liver Function Tests: Recent Labs  Lab 01/19/17 1724  AST 15  ALT 39  ALKPHOS 70  BILITOT 0.7  PROT 6.3*  ALBUMIN 3.2*   Cardiac Enzymes: Recent Labs  Lab 01/19/17 1724  TROPONINI <0.03    HbA1C: Hgb A1c MFr Bld  Date/Time Value Ref Range Status    12/17/2016 05:53 AM 5.5 4.8 - 5.6 % Final    Comment:    (NOTE)         Prediabetes: 5.7 - 6.4         Diabetes: >6.4         Glycemic control for adults with diabetes: <7.0   11/11/2016 09:44 AM 5.2 4.8 - 5.6 % Final    Comment:             Prediabetes: 5.7 - 6.4          Diabetes: >6.4          Glycemic control for adults with diabetes: <7.0     CBG: Recent Labs  Lab 01/21/17 0721 01/22/17 0723 01/23/17 0736 01/24/17 0838 01/25/17 0832  GLUCAP 164* 141* 127* 123* 112*    Recent Results (from the past 240 hour(s))  MRSA PCR Screening     Status: None   Collection Time: 01/20/17  1:15 AM  Result Value Ref Range Status   MRSA by PCR NEGATIVE NEGATIVE Final    Comment:        The GeneXpert MRSA Assay (FDA approved for NASAL specimens only), is one component of a comprehensive MRSA colonization surveillance program. It is not intended to diagnose MRSA infection nor to guide or monitor treatment for MRSA infections.   Respiratory Panel by PCR     Status: Abnormal   Collection Time: 01/20/17  9:31 AM  Result Value Ref Range Status   Adenovirus NOT DETECTED NOT DETECTED Final   Coronavirus 229E NOT DETECTED NOT DETECTED Final   Coronavirus HKU1 NOT DETECTED NOT DETECTED Final   Coronavirus NL63 NOT DETECTED NOT DETECTED Final   Coronavirus OC43 NOT DETECTED NOT DETECTED Final   Metapneumovirus NOT DETECTED NOT DETECTED Final   Rhinovirus / Enterovirus NOT DETECTED NOT DETECTED Final   Influenza A H1 2009 DETECTED (A) NOT DETECTED Final   Influenza B NOT DETECTED NOT DETECTED Final   Parainfluenza Virus 1 NOT DETECTED NOT DETECTED Final   Parainfluenza Virus 2 NOT DETECTED NOT DETECTED Final   Parainfluenza Virus 3 NOT DETECTED NOT DETECTED Final   Parainfluenza Virus 4 NOT DETECTED NOT DETECTED Final   Respiratory Syncytial Virus NOT DETECTED NOT DETECTED Final   Bordetella pertussis NOT DETECTED NOT DETECTED Final   Chlamydophila pneumoniae NOT DETECTED NOT  DETECTED Final   Mycoplasma pneumoniae NOT DETECTED  NOT DETECTED Final  Culture, expectorated sputum-assessment     Status: None   Collection Time: 01/20/17  4:21 PM  Result Value Ref Range Status   Specimen Description EXPECTORATED SPUTUM  Final   Special Requests NONE  Final   Sputum evaluation THIS SPECIMEN IS ACCEPTABLE FOR SPUTUM CULTURE  Final   Report Status 01/20/2017 FINAL  Final  Culture, respiratory (NON-Expectorated)     Status: None   Collection Time: 01/20/17  4:21 PM  Result Value Ref Range Status   Specimen Description EXPECTORATED SPUTUM  Final   Special Requests NONE Reflexed from [id number]  Final   Gram Stain   Final    ABUNDANT WBC PRESENT, PREDOMINANTLY PMN RARE GRAM POSITIVE COCCI IN CHAINS RARE GRAM NEGATIVE COCCI IN PAIRS NO SQUAMOUS EPITHELIAL CELLS SEEN    Culture Consistent with normal respiratory flora.  Final   Report Status 01/23/2017 FINAL  Final     Scheduled Meds: . aspirin EC  81 mg Oral Daily  . budesonide (PULMICORT) nebulizer solution  0.5 mg Nebulization BID  . busPIRone  15 mg Oral BID  . chlorhexidine  15 mL Mouth Rinse BID  . cholecalciferol  1,000 Units Oral Daily  . enoxaparin (LOVENOX) injection  40 mg Subcutaneous Q24H  . ipratropium-albuterol  3 mL Nebulization TID  . lisinopril  5 mg Oral Daily  . mouth rinse  15 mL Mouth Rinse q12n4p  . metoprolol tartrate  50 mg Oral BID  .  morphine injection  2 mg Intravenous Once  . multivitamin with minerals  1 tablet Oral Daily  . oseltamivir  30 mg Oral BID  . predniSONE  40 mg Oral BID WC  . sodium chloride flush  3 mL Intravenous Q12H     LOS: 6 days   Lonia Blood, MD [facility] Office  [phone] Pager - Text Page per Loretha Stapler as per below:  On-Call/Text Page:      [email or web address]      password [email or web address]  If 7PM-7AM, please contact night-coverage [email or web address] Password [email or web address] 01/25/2017, 12:10 PM

## 2017-01-26 LAB — CBC
HEMATOCRIT: 35.7 % — AB (ref 36.0–46.0)
HEMOGLOBIN: 10.8 g/dL — AB (ref 12.0–15.0)
MCH: 28.6 pg (ref 26.0–34.0)
MCHC: 30.3 g/dL (ref 30.0–36.0)
MCV: 94.4 fL (ref 78.0–100.0)
Platelets: 155 10*3/uL (ref 150–400)
RBC: 3.78 MIL/uL — AB (ref 3.87–5.11)
RDW: 15 % (ref 11.5–15.5)
WBC: 9.5 10*3/uL (ref 4.0–10.5)

## 2017-01-26 LAB — BASIC METABOLIC PANEL
ANION GAP: 8 (ref 5–15)
BUN: 25 mg/dL — AB (ref 6–20)
CHLORIDE: 84 mmol/L — AB (ref 101–111)
CO2: 44 mmol/L — AB (ref 22–32)
Calcium: 9.1 mg/dL (ref 8.9–10.3)
Creatinine, Ser: 0.7 mg/dL (ref 0.44–1.00)
GFR calc Af Amer: >60 mL/min (ref >60)
GFR calc non Af Amer: >60 mL/min (ref >60)
GLUCOSE: 112 mg/dL — AB (ref 65–99)
POTASSIUM: 3.8 mmol/L (ref 3.5–5.1)
Sodium: 136 mmol/L (ref 135–145)

## 2017-01-26 LAB — GLUCOSE, CAPILLARY: GLUCOSE-CAPILLARY: 101 mg/dL — AB (ref 65–99)

## 2017-01-26 LAB — BLOOD GAS, ARTERIAL
ACID-BASE EXCESS: 20.4 mmol/L — AB (ref 0.0–2.0)
Bicarbonate: 46.3 mmol/L — ABNORMAL HIGH (ref 20.0–28.0)
DRAWN BY: 331761
O2 Content: 3 L/min
O2 SAT: 93.4 %
PH ART: 7.419 (ref 7.350–7.450)
Patient temperature: 98.6
pCO2 arterial: 72.9 mmHg (ref 32.0–48.0)
pO2, Arterial: 65.7 mmHg — ABNORMAL LOW (ref 83.0–108.0)

## 2017-01-26 LAB — MAGNESIUM: Magnesium: 1.9 mg/dL (ref 1.7–2.4)

## 2017-01-26 LAB — LACTIC ACID, PLASMA: LACTIC ACID, VENOUS: 0.8 mmol/L (ref 0.5–1.9)

## 2017-01-26 MED ORDER — RACEPINEPHRINE HCL 2.25 % IN NEBU
0.5000 mL | INHALATION_SOLUTION | Freq: Once | RESPIRATORY_TRACT | Status: AC
  Administered 2017-01-26: 0.5 mL via RESPIRATORY_TRACT
  Filled 2017-01-26: qty 0.5

## 2017-01-26 MED ORDER — RACEPINEPHRINE HCL 2.25 % IN NEBU
0.5000 mL | INHALATION_SOLUTION | Freq: Two times a day (BID) | RESPIRATORY_TRACT | Status: DC
  Filled 2017-01-26: qty 0.5

## 2017-01-26 NOTE — Progress Notes (Addendum)
PROGRESS NOTE    Sheila Pierce  ZOX:[id number] DOB: [birth year] DOA: 01/19/2017 PCP: [name], MD   Brief Narrative:  72 y.o. WF PMHx   Anxiety, Depression, NSTEMI, Chronic Diastolic CHF, HTN, QT prolongation, HLD, COPD, Chronic resp failure on home O2 3 L via [place],Tobacco abuse, CKD, Diabetes mellitus without complication,  Anemia, Trigeminal neuralgia  Presenting to the emergency department for increased dyspnea.  Patient reports chronic shortness of breath, but notes that this has decreased markedly in recent days point where she is now unable to speak more than 1-2 words without gasping for air.  Denies fevers or chills and denies chest pain or palpitations.   ED Course: Upon arrival to the ED, patient is found to be afebrile, saturating adequately on 3 L/min of supplemental oxygen, tachypneic, tachycardic, and with stable blood pressure.  EKG features a sinus tachycardia with rate 110 and chest x-ray is negative for acute cardiopulmonary disease.  Chemistry panel reveals a slight hyponatremia carbonate of 35.  BUN to creatinine ratio is elevated.  CBC is unremarkable.  ABG reveals pH 7.26, pCO2 88, pO2 102.  Patient was treated with continuous albuterol neb, 2 g IV magnesium, 125 mg IV Solu-Medrol, and started on BiPAP.  She remains hemodynamically stable, remains markedly dyspneic, and will be admitted to the stepdown unit for ongoing evaluation and management of acute exacerbation in COPD.    Subjective: 1/2 A/O 4, positive acute on chronic respiratory distress (patient refused to wear BiPAP last night), negative CP, negative abdominal pain, negative N/V.    Assessment & Plan:   Active Problems:   CAD (coronary artery disease)   Anxiety   COPD exacerbation (HCC)   Chronic diastolic CHF (congestive heart failure) (HCC)   Acute on chronic respiratory failure with hypercapnia (HCC)   COPD with acute exacerbation (HCC)   Sepsis unspecified organism? -Patient meets criteria  on admission HR> 90, RR> 20. In addition patient has lactic acidosis 2.3 -Completed course of levofloxacin and Tamiflu -Lactic acid normalized.  positive influenza A pneumonia -See sepsis  Acute on Chronic Respiratory Failure with Hypoxia and hypercarbia/COPD exacerbation -DuoNeb TID  -Pulmicort nebulizer - Discontinue Breo-Ellipta patient's poor respiratory status will not allow her to control and sufficient amount of medication  -prednisone 40 mg BID -Mucinex DM -Flutter valve -BiPAP QHS & PRN sleeping (patient cannot refuse treatment as she decompensates the next day). If patient refuses contact on call physician -RN instructions ENSURE patient wears BiPAP notify on-call physician when BiPAP discontinued. -1/2 pulmonary status not improving sufficiently: Received epi 4 doses   Chronic Diastolic CHF (base weight?) -EKG from 01/19/2017 compared to EKG 01/01/2017 no significant change. Sinus tachycardia -Strict in and out since admission +3.0 L -Daily weight Filed Weights   01/24/17 0608 01/24/17 0635 01/26/17 0653  Weight: 138 lb 14.2 oz (63 kg) 139 lb 8.8 oz (63.3 kg) 141 lb 12.1 oz (64.3 kg)  -Lasix 20 mg daily -Hydralazine PRN -Lisinopril 10 mg daily -Metoprolol 50 mg BID -Metoprolol IV 7.5 mg PRN HR>105  CAD  -Asymptomatic -ASA 81 mg daily -See CHF  Anxiety -BuSpar 15 mg daily -Ativan PRN     DVT prophylaxis: Lovenox Code Status: DNR Family Communication: None Disposition Plan: TBD   Consultants:  None   Procedures/Significant Events:  12/17/16 Echocardiogram: LVEF=:65% to 70%.-Grade 1 diastolic dysfunction). - Atrial septum: There was increased thickness of the septum, c/w lipomatous hypertrophy.  -----------------------------------------------------------------------------------------------------------------------------------    I have personally reviewed and interpreted all radiology studies and  my findings are as above.  VENTILATOR  SETTINGS:    Cultures 12/27 MRSA by PCR negative 12/27 respiratory virus panel positive influenza A     Antimicrobials: Anti-infectives (From admission, onward)   Start     Stop   01/22/17 1200  levofloxacin (LEVAQUIN) tablet 750 mg  Status:  Discontinued     01/24/17 1622   01/21/17 0945  oseltamivir (TAMIFLU) capsule 30 mg     01/26/17 0959   01/20/17 1200  levofloxacin (LEVAQUIN) IVPB 750 mg  Status:  Discontinued     01/22/17 1119        Devices  LINES / TUBES:      Continuous Infusions:    Objective: Vitals:   01/26/17 0600 01/26/17 0653 01/26/17 0723 01/26/17 0800  BP: (!) 180/78   (!) 172/69  Pulse: 85   98  Resp: (!) 26   20  Temp:    98.1 F (36.7 C)  TempSrc:    Oral  SpO2: 97%  99% 98%  Weight:  141 lb 12.1 oz (64.3 kg)    Height:        Intake/Output Summary (Last 24 hours) at 01/26/2017 0809 Last data filed at 01/26/2017 3606 Gross per 24 hour  Intake 483 ml  Output 1900 ml  Net -1417 ml   Filed Weights   01/24/17 0608 01/24/17 0635 01/26/17 0653  Weight: 138 lb 14.2 oz (63 kg) 139 lb 8.8 oz (63.3 kg) 141 lb 12.1 oz (64.3 kg)     Physical Exam:  General: A/O 4, positive acute on chronic respiratory distress Lungs: tachypnea continued diffuse poor air movement, pursed lip breathing, positive diffuse expiratory wheezing, negative crackles  Cardiovascular: Tachycardic, Regular rate and rhythm without murmur gallop or rub normal S1 and S2 Abdomen: negative abdominal pain, nondistended, positive soft, bowel sounds, no rebound, no ascites, no appreciable mass Extremities: No significant cyanosis, clubbing, or edema bilateral lower extremities Skin: Negative rashes, lesions, ulcers Psychiatric:  Negative depression, positive anxiety, negative fatigue, negative mania  Central nervous system:  Cranial nerves II through XII intact, tongue/uvula midline, all extremities muscle strength 5/5, sensation intact throughout,  negative dysarthria,  negative expressive aphasia, negative receptive aphasia.    Data Reviewed: Care during the described time interval was provided by me .  I have reviewed this patient's available data, including medical history, events of note, physical examination, and all test results as part of my evaluation.   CBC: Recent Labs  Lab 01/19/17 1724  01/21/17 0219 01/22/17 0513 01/23/17 0514 01/24/17 0412 01/25/17 0429  WBC 9.8   < > 18.7* 20.9* 9.2 8.7 8.5  NEUTROABS 7.5  --   --   --   --   --   --   HGB 11.5*   < > 10.1* 10.4* 10.8* 11.3* 10.6*  HCT 37.7   < > 33.5* 35.6* 36.9 36.3 34.9*  MCV 95.7   < > 96.3 99.7 97.9 95.0 95.1  PLT 212   < > 207 228 198 163 173   < > = values in this interval not displayed.   Basic Metabolic Panel: Recent Labs  Lab 01/20/17 0744 01/21/17 0219 01/21/17 0744 01/22/17 0513 01/23/17 0514 01/24/17 0412 01/25/17 0429  NA 134* 137  --  138 137 138 134*  K 4.8 5.4* 5.0 5.4* 4.4 4.4 4.1  CL 90* 95*  --  94* 91* 95* 89*  CO2 35* 39*  --  37* 37* 34* 39*  GLUCOSE 156* 138*  --  126* 167* 127* 109*  BUN 35* 32*  --  24* 24* 25* 25*  CREATININE 0.99 0.87  --  0.68 0.69 0.63 0.62  CALCIUM 8.5* 8.7*  --  8.8* 8.7* 8.6* 8.9  MG 2.7* 2.2  --  2.2 2.0 1.9  --    GFR: Estimated Creatinine Clearance: 53.2 mL/min (by C-G formula based on SCr of 0.62 mg/dL). Liver Function Tests: Recent Labs  Lab 01/19/17 1724  AST 15  ALT 39  ALKPHOS 70  BILITOT 0.7  PROT 6.3*  ALBUMIN 3.2*   No results for input(s): LIPASE, AMYLASE in the last 168 hours. No results for input(s): AMMONIA in the last 168 hours. Coagulation Profile: No results for input(s): INR, PROTIME in the last 168 hours. Cardiac Enzymes: Recent Labs  Lab 01/19/17 1724  TROPONINI <0.03   BNP (last 3 results) No results for input(s): PROBNP in the last 8760 hours. HbA1C: No results for input(s): HGBA1C in the last 72 hours. CBG: Recent Labs  Lab 01/22/17 0723 01/23/17 0736 01/24/17 0838  01/25/17 0832 01/26/17 0805  GLUCAP 141* 127* 123* 112* 101*   Lipid Profile: No results for input(s): CHOL, HDL, LDLCALC, TRIG, CHOLHDL, LDLDIRECT in the last 72 hours. Thyroid Function Tests: No results for input(s): TSH, T4TOTAL, FREET4, T3FREE, THYROIDAB in the last 72 hours. Anemia Panel: No results for input(s): VITAMINB12, FOLATE, FERRITIN, TIBC, IRON, RETICCTPCT in the last 72 hours. Urine analysis:    Component Value Date/Time   COLORURINE YELLOW 04/16/2016 1920   APPEARANCEUR HAZY (A) 04/16/2016 1920   LABSPEC 1.006 04/16/2016 1920   PHURINE 6.0 04/16/2016 1920   GLUCOSEU NEGATIVE 04/16/2016 1920   HGBUR SMALL (A) 04/16/2016 1920   BILIRUBINUR neg 11/24/2016 1131   KETONESUR NEGATIVE 04/16/2016 1920   PROTEINUR neg 11/24/2016 1131   PROTEINUR NEGATIVE 04/16/2016 1920   UROBILINOGEN 0.2 11/24/2016 1131   NITRITE neg 11/24/2016 1131   NITRITE POSITIVE (A) 04/16/2016 1920   LEUKOCYTESUR Small (1+) (A) 11/24/2016 1131   Sepsis Labs: @LABRCNTIP (procalcitonin:4,lacticidven:4)  ) Recent Results (from the past 240 hour(s))  MRSA PCR Screening     Status: None   Collection Time: 01/20/17  1:15 AM  Result Value Ref Range Status   MRSA by PCR NEGATIVE NEGATIVE Final    Comment:        The GeneXpert MRSA Assay (FDA approved for NASAL specimens only), is one component of a comprehensive MRSA colonization surveillance program. It is not intended to diagnose MRSA infection nor to guide or monitor treatment for MRSA infections.   Respiratory Panel by PCR     Status: Abnormal   Collection Time: 01/20/17  9:31 AM  Result Value Ref Range Status   Adenovirus NOT DETECTED NOT DETECTED Final   Coronavirus 229E NOT DETECTED NOT DETECTED Final   Coronavirus HKU1 NOT DETECTED NOT DETECTED Final   Coronavirus NL63 NOT DETECTED NOT DETECTED Final   Coronavirus OC43 NOT DETECTED NOT DETECTED Final   Metapneumovirus NOT DETECTED NOT DETECTED Final   Rhinovirus / Enterovirus NOT  DETECTED NOT DETECTED Final   Influenza A H1 2009 DETECTED (A) NOT DETECTED Final   Influenza B NOT DETECTED NOT DETECTED Final   Parainfluenza Virus 1 NOT DETECTED NOT DETECTED Final   Parainfluenza Virus 2 NOT DETECTED NOT DETECTED Final   Parainfluenza Virus 3 NOT DETECTED NOT DETECTED Final   Parainfluenza Virus 4 NOT DETECTED NOT DETECTED Final   Respiratory Syncytial Virus NOT DETECTED NOT DETECTED Final   Bordetella pertussis NOT DETECTED  NOT DETECTED Final   Chlamydophila pneumoniae NOT DETECTED NOT DETECTED Final   Mycoplasma pneumoniae NOT DETECTED NOT DETECTED Final  Culture, expectorated sputum-assessment     Status: None   Collection Time: 01/20/17  4:21 PM  Result Value Ref Range Status   Specimen Description EXPECTORATED SPUTUM  Final   Special Requests NONE  Final   Sputum evaluation THIS SPECIMEN IS ACCEPTABLE FOR SPUTUM CULTURE  Final   Report Status 01/20/2017 FINAL  Final  Culture, respiratory (NON-Expectorated)     Status: None   Collection Time: 01/20/17  4:21 PM  Result Value Ref Range Status   Specimen Description EXPECTORATED SPUTUM  Final   Special Requests NONE Reflexed from [id number]  Final   Gram Stain   Final    ABUNDANT WBC PRESENT, PREDOMINANTLY PMN RARE GRAM POSITIVE COCCI IN CHAINS RARE GRAM NEGATIVE COCCI IN PAIRS NO SQUAMOUS EPITHELIAL CELLS SEEN    Culture Consistent with normal respiratory flora.  Final   Report Status 01/23/2017 FINAL  Final         Radiology Studies: No results found.      Scheduled Meds: . aspirin EC  81 mg Oral Daily  . budesonide (PULMICORT) nebulizer solution  0.5 mg Nebulization BID  . busPIRone  15 mg Oral BID  . chlorhexidine  15 mL Mouth Rinse BID  . cholecalciferol  1,000 Units Oral Daily  . enoxaparin (LOVENOX) injection  40 mg Subcutaneous Q24H  . furosemide  20 mg Oral Daily  . ipratropium-albuterol  3 mL Nebulization TID  . lisinopril  10 mg Oral Daily  . mouth rinse  15 mL Mouth Rinse q12n4p  .  metoprolol tartrate  50 mg Oral BID  . multivitamin with minerals  1 tablet Oral Daily  . oseltamivir  30 mg Oral BID  . potassium chloride  10 mEq Oral Daily  . predniSONE  40 mg Oral BID WC  . sodium chloride flush  3 mL Intravenous Q12H   Continuous Infusions:    LOS: 7 days    Time spent: 40 minutes    Shelley Pooley, Roselind Messier, MD [facility] Pager [phone]   If 7PM-7AM, please contact night-coverage [email or web address] Password [email or web address] 01/26/2017, 8:09 AM

## 2017-01-26 NOTE — Progress Notes (Signed)
OT Cancellation Note  Patient Details Name: Sheila Pierce MRN: [id number] DOB: [birth year]   Cancelled Treatment:    Reason Eval/Treat Not Completed: Fatigue/lethargy limiting ability to participate. Pt declined session "I just want to nap". OT/PT made a plan in collaboration with Pt to come back around 11:30 in preparation for sitting up for lunch and therapy provided education about benefits of OOB for health to Pt. Will return.  Evern Bio Marcele Kosta 01/26/2017, 9:05 AM  Sherryl Manges OTR/L [phone]

## 2017-01-26 NOTE — Progress Notes (Signed)
Physical Therapy Treatment Patient Details Name: Sheila Pierce MRN: [id number] DOB: [birth year] Today's Date: 01/26/2017    History of Present Illness Pt is a 72 y/o female with medical history significant for chronic diastolic CHF, anxiety, and COPD with chronic hypercarbic and hypoxic respiratory failure, now presenting to the emergency department for increased dyspnea secondary to a COPD exacerbation    PT Comments    Pt admitted with above diagnosis. Pt currently with functional limitations due to the deficits listed below (see PT Problem List). Pt very limited in what she would do today. Agreed to sit EOB and bathe but only able to tolerate sitting for a few minutes and pt would not assist in bathing herself except to wash her face.  Pt did perform some exercises.  Will continue PT.   Pt will benefit from skilled PT to increase their independence and safety with mobility to allow discharge to the venue listed below.     Follow Up Recommendations  SNF;Supervision/Assistance - 24 hour     Equipment Recommendations  None recommended by PT    Recommendations for Other Services       Precautions / Restrictions Precautions Precautions: Fall Precaution Comments: watch SPO2, DROPLET Restrictions Weight Bearing Restrictions: No    Mobility  Bed Mobility Overal bed mobility: Needs Assistance Bed Mobility: Rolling;Sidelying to Sit Rolling: Min assist(vc for sequencing) Sidelying to sit: Mod assist       General bed mobility comments: Needed mod assist to bring LEs off bed and elevation of trunk.  Transfers         Stand pivot transfers: Mod assist;+2 physical assistance       General transfer comment: Pt adamently refusing ANY OOB at this time. Educated Pt on benefits, MAX encouragement and Pt "You can't tell me what to do"  Ambulation/Gait                 Stairs            Wheelchair Mobility    Modified Rankin (Stroke Patients Only)       Balance  Overall balance assessment: Needs assistance Sitting-balance support: Bilateral upper extremity supported;Feet supported Sitting balance-Leahy Scale: Fair Sitting balance - Comments: Pt able to tolerate approx 5-8 min sitting EOB while PT/OT and pt attempted to bathe pt.  Pt able to wash face only and PT washed pts front of body and OT washed pts back.  Pt only agreed to wash quickly and began to lie down and got upset that she even sat up that long.  Assisted pt back up to [facility] and in a good position.                                     Cognition Arousal/Alertness: Awake/alert Behavior During Therapy: Anxious Overall Cognitive Status: No family/caregiver present to determine baseline cognitive functioning                                 General Comments: cognition not formally assessed but [facility] for general conversation      Exercises General Exercises - Upper Extremity Shoulder Flexion: AROM;Both;5 reps;Supine General Exercises - Lower Extremity Heel Slides: AROM;Both;5 reps;Supine    General Comments General comments (skin integrity, edema, etc.): Had NT place purewick as pt constantly urinating.  Did change pads under pt when she was sitting up.  Pertinent Vitals/Pain Pain Assessment: Faces Faces Pain Scale: Hurts even more Pain Location: generalized - R shoulder with ex Pain Descriptors / Indicators: Aching;Grimacing;Guarding Pain Intervention(s): Limited activity within patient's tolerance;Monitored during session;Premedicated before session;Repositioned   VSS with pt on 4LO2.   Home Living                      Prior Function            PT Goals (current goals can now be found in the care plan section) Acute Rehab PT Goals Patient Stated Goal: to feel better Progress towards PT goals: Progressing toward goals    Frequency    Min 2X/week      PT Plan Current plan remains appropriate;Frequency needs to be updated     Co-evaluation PT/OT/SLP Co-Evaluation/Treatment: Yes Reason for Co-Treatment: For patient/therapist safety;To address functional/ADL transfers PT goals addressed during session: Balance;Strengthening/ROM OT goals addressed during session: ADL's and self-care      AM-PAC PT "6 Clicks" Daily Activity  Outcome Measure  Difficulty turning over in bed (including adjusting bedclothes, sheets and blankets)?: Unable Difficulty moving from lying on back to sitting on the side of the bed? : Unable Difficulty sitting down on and standing up from a chair with arms (e.g., wheelchair, bedside commode, etc,.)?: Unable Help needed moving to and from a bed to chair (including a wheelchair)?: Total Help needed walking in hospital room?: Total Help needed climbing 3-5 steps with a railing? : Total 6 Click Score: 6    End of Session Equipment Utilized During Treatment: Oxygen;Gait belt Activity Tolerance: Patient limited by fatigue(self limiting) Patient left: with call bell/phone within reach;in bed;with bed alarm set Nurse Communication: Mobility status(to replace purewick) PT Visit Diagnosis: Other abnormalities of gait and mobility (R26.89)     Time: 2094-7096 PT Time Calculation (min) (ACUTE ONLY): 24 min  Charges:  $Therapeutic Activity: 8-22 mins                    G Codes:       Nyisha Clippard,PT Acute Rehabilitation [phone] (pager)    Berline Lopes 01/26/2017, 11:56 AM

## 2017-01-26 NOTE — [place] FL2 (Signed)
[place] MEDICAID [id number] LEVEL OF CARE SCREENING TOOL     IDENTIFICATION  Patient Name: Sheila Pierce Birthdate: [birth year] Sex: female Admission Date (Current Location): 01/19/2017  [facility] and [place] Number:  [employer] and Address:  [facility], [address] [place], [place] [address]      Provider Number: [id number]  Attending Physician Name and Address:  Drema Dallas, MD  Relative Name and Phone Number:       Current Level of Care: Hospital Recommended Level of Care: Skilled Nursing Facility Prior Approval Number:    Date Approved/Denied:   PASRR Number:   [id number]   Discharge Plan: SNF    Current Diagnoses: Patient Active Problem List   Diagnosis Date Noted  . COPD with acute exacerbation (HCC) 01/19/2017  . Acute on chronic diastolic CHF (congestive heart failure) (HCC) 12/19/2016  . Acute on chronic respiratory failure with hypercapnia (HCC) 12/17/2016  . CKD (chronic kidney disease), stage III (HCC) 12/17/2016  . Type 2 diabetes mellitus with stage 3 chronic kidney disease (HCC) 12/17/2016  . Acute respiratory failure (HCC) 12/16/2016  . GERD (gastroesophageal reflux disease) 12/16/2016  . Chronic diastolic CHF (congestive heart failure) (HCC) 10/30/2016  . CKD (chronic kidney disease), stage II 10/30/2016  . Pressure injury of skin 09/15/2016  . COPD (chronic obstructive pulmonary disease) (HCC) 12/19/2015  . Acute on chronic respiratory failure with hypoxia (HCC) 12/19/2015  . Hypercholesteremia 12/16/2015  . Osteoporosis 12/01/2015  . Colonoscopy refused 12/01/2015  . Macular degeneration, age related, nonexudative 11/26/2015  . Cataract incipient, senile, bilateral 11/26/2015  . Onychomycosis of toenail 11/11/2015  . Aortic atherosclerosis (HCC) 11/11/2015  . Abnormal thyroid function test 11/06/2015  . Diabetes mellitus, stable (HCC) 11/05/2015  . COPD exacerbation (HCC) 11/04/2015  . AKI (acute kidney  injury) (HCC) 11/03/2015  . Anemia 11/03/2015  . Anxiety 11/03/2015  . Acute exacerbation of chronic obstructive pulmonary disease (COPD) (HCC) 09/19/2012  . CAD (coronary artery disease) 11/24/2011  . Essential hypertension   . MI, acute, non ST segment elevation (HCC) 10/05/2011    Orientation RESPIRATION BLADDER Height & Weight     Self, Time, Situation, Place  O2, Other (Comment)(possibly will need new BIPAP vs CPAP) Incontinent, External catheter Weight: 141 lb 12.1 oz (64.3 kg) Height:  5' (152.4 cm)  BEHAVIORAL SYMPTOMS/MOOD NEUROLOGICAL BOWEL NUTRITION STATUS      Continent Diet(cardiac)  AMBULATORY STATUS COMMUNICATION OF NEEDS Skin   Extensive Assist Verbally Normal                       Personal Care Assistance Level of Assistance  Bathing, Dressing Bathing Assistance: Maximum assistance   Dressing Assistance: Maximum assistance     Functional Limitations Info             SPECIAL CARE FACTORS FREQUENCY  PT (By licensed PT), OT (By licensed OT)     PT Frequency: 5/wk OT Frequency: 5/wk            Contractures      Additional Factors Info  Code Status, Allergies, Isolation Precautions, Psychotropic Code Status Info: FULL Allergies Info: Penicillins, Sulfa Antibiotics, Codeine, Hydrocodone, Levaquin Levofloxacin Psychotropic Info: buspar   Isolation Precautions Info: Droplet     Current Medications (01/26/2017):  This is the current hospital active medication list Current Facility-Administered Medications  Medication Dose Route Frequency Provider Last Rate Last Dose  . acetaminophen (TYLENOL) tablet 650 mg  650 mg Oral Q6H  PRN Briscoe Deutscher, MD   650 mg at 01/21/17 0131   Or  . acetaminophen (TYLENOL) suppository 650 mg  650 mg Rectal Q6H PRN Opyd, Lavone Neri, MD      . albuterol (PROVENTIL) (2.5 MG/3ML) 0.083% nebulizer solution 2.5 mg  2.5 mg Nebulization Q2H PRN Lonia Blood, MD      . aspirin EC tablet 81 mg  81 mg Oral Daily Opyd,  Lavone Neri, MD   81 mg at 01/26/17 1152  . bisacodyl (DULCOLAX) EC tablet 5 mg  5 mg Oral Daily PRN Opyd, Lavone Neri, MD   5 mg at 01/25/17 1022  . budesonide (PULMICORT) nebulizer solution 0.5 mg  0.5 mg Nebulization BID Drema Dallas, MD   0.5 mg at 01/26/17 0724  . busPIRone (BUSPAR) tablet 15 mg  15 mg Oral BID Opyd, Lavone Neri, MD   15 mg at 01/26/17 1150  . chlorhexidine (PERIDEX) 0.12 % solution 15 mL  15 mL Mouth Rinse BID Drema Dallas, MD   15 mL at 01/25/17 2307  . cholecalciferol (VITAMIN D) tablet 1,000 Units  1,000 Units Oral Daily Opyd, Lavone Neri, MD   1,000 Units at 01/26/17 1152  . dextromethorphan-guaiFENesin (MUCINEX DM) 30-600 MG per 12 hr tablet 1 tablet  1 tablet Oral BID PRN Lonia Blood, MD   1 tablet at 01/25/17 1640  . enoxaparin (LOVENOX) injection 40 mg  40 mg Subcutaneous Q24H Opyd, Lavone Neri, MD   40 mg at 01/25/17 2305  . furosemide (LASIX) tablet 20 mg  20 mg Oral Daily Jetty Duhamel T, MD   20 mg at 01/26/17 1152  . hydrALAZINE (APRESOLINE) injection 5 mg  5 mg Intravenous Q4H PRN Audrea Muscat T, NP   5 mg at 01/24/17 2213  . ipratropium-albuterol (DUONEB) 0.5-2.5 (3) MG/3ML nebulizer solution 3 mL  3 mL Nebulization TID Lonia Blood, MD   3 mL at 01/26/17 1303  . lisinopril (PRINIVIL,ZESTRIL) tablet 10 mg  10 mg Oral Daily Lonia Blood, MD   10 mg at 01/26/17 1208  . LORazepam (ATIVAN) injection 0.5-1 mg  0.5-1 mg Intravenous Q6H PRN Drema Dallas, MD   0.5 mg at 01/26/17 1218  . MEDLINE mouth rinse  15 mL Mouth Rinse q12n4p Drema Dallas, MD   15 mL at 01/25/17 1428  . metoprolol tartrate (LOPRESSOR) injection 7.5 mg  7.5 mg Intravenous Q8H PRN Drema Dallas, MD   7.5 mg at 01/23/17 1833  . metoprolol tartrate (LOPRESSOR) tablet 50 mg  50 mg Oral BID Drema Dallas, MD   50 mg at 01/26/17 1210  . multivitamin with minerals tablet 1 tablet  1 tablet Oral Daily Opyd, Lavone Neri, MD   1 tablet at 01/26/17 1151  . ondansetron (ZOFRAN)  tablet 4 mg  4 mg Oral Q6H PRN Opyd, Lavone Neri, MD   4 mg at 01/21/17 1325   Or  . ondansetron (ZOFRAN) injection 4 mg  4 mg Intravenous Q6H PRN Opyd, Lavone Neri, MD      . potassium chloride (K-DUR) CR tablet 10 mEq  10 mEq Oral Daily Lonia Blood, MD   10 mEq at 01/26/17 1151  . predniSONE (DELTASONE) tablet 40 mg  40 mg Oral BID WC Lonia Blood, MD   40 mg at 01/26/17 0836  . Racepinephrine HCl 2.25 % nebulizer solution 0.5 mL  0.5 mL Nebulization BID Drema Dallas, MD      . senna-docusate (  Senokot-S) tablet 1 tablet  1 tablet Oral QHS PRN Opyd, Lavone Neri, MD      . sodium chloride flush (NS) 0.9 % injection 3 mL  3 mL Intravenous Q12H Opyd, Lavone Neri, MD   3 mL at 01/26/17 1053  . traMADol (ULTRAM) tablet 50 mg  50 mg Oral Q6H PRN Opyd, Lavone Neri, MD   50 mg at 01/24/17 2256     Discharge Medications: Please see discharge summary for a list of discharge medications.  Relevant Imaging Results:  Relevant Lab Results:   Additional Information SS#: [id number]  Burna Sis, LCSW

## 2017-01-26 NOTE — Progress Notes (Signed)
PT Cancellation Note  Patient Details Name: Sheila Pierce MRN: [id number] DOB: [birth year]   Cancelled Treatment:    Reason Eval/Treat Not Completed: Fatigue/lethargy limiting ability to participate(Pt wants to take a nap.  Pt asked PT/OT to return at 1130 for planned session at that time. )   Berline Lopes 01/26/2017, 9:05 AM [facility] [phone] (pager)

## 2017-01-26 NOTE — Progress Notes (Signed)
Occupational Therapy Treatment Patient Details Name: KHADIJA THIER MRN: [id number] DOB: 12/05/1945 Today's Date: 01/26/2017    History of present illness Pt is a 72 y/o female with medical history significant for chronic diastolic CHF, anxiety, and COPD with chronic hypercarbic and hypoxic respiratory failure, now presenting to the emergency department for increased dyspnea secondary to a COPD exacerbation   OT comments  Pt making very limited progress towards goals. Pt able to sit EOB for a very limited amount of time with max A to remain there for approx 5 min. Pt participated minimally in bathing this session, and agreeable to very limited bed level exercises. Pt educated on benefits of OOB and activity participation. Pt continues to benefit from skilled OT in the acute setting and will require SNF level therapy for safety upon dc.   Follow Up Recommendations  SNF;Supervision/Assistance - 24 hour    Equipment Recommendations  Other (comment)(defer to next venue)    Recommendations for Other Services      Precautions / Restrictions Precautions Precautions: Fall Precaution Comments: watch SPO2 Restrictions Weight Bearing Restrictions: No       Mobility Bed Mobility Overal bed mobility: Needs Assistance Bed Mobility: Rolling;Sidelying to Sit Rolling: Min assist(vc for sequencing) Sidelying to sit: Mod assist       General bed mobility comments: Needed mod assist to bring LEs off bed and elevation of trunk.  Transfers                 General transfer comment: Pt adamently refusing ANY OOB at this time. Educated Pt on benefits, MAX encouragement and Pt "You can't tell me what to do"    Balance Overall balance assessment: Needs assistance Sitting-balance support: Bilateral upper extremity supported;Feet supported Sitting balance-Leahy Scale: Fair Sitting balance - Comments: Pt able to tolerate approx 5-8 min sitting EOB                                    ADL either performed or assessed with clinical judgement   ADL Overall ADL's : Needs assistance/impaired     Grooming: Set up;Sitting;Bed level   Upper Body Bathing: Moderate assistance;Sitting Upper Body Bathing Details (indicate cue type and reason): EOB Lower Body Bathing: Total assistance;Sitting/lateral leans;Bed level Lower Body Bathing Details (indicate cue type and reason): Pt would not attempt to assist with bathing this session                       General ADL Comments: Pt declined oral care again this session. Pt continues to be self limiting, breathing technique (pursed lip) encouraged - althogh Pt largely breathes through mouth     Vision       Perception     Praxis      Cognition Arousal/Alertness: Awake/alert Behavior During Therapy: Anxious Overall Cognitive Status: No family/caregiver present to determine baseline cognitive functioning                                          Exercises Exercises: General Upper Extremity General Exercises - Upper Extremity Shoulder Flexion: AROM;Both;5 reps;Supine   Shoulder Instructions       General Comments      Pertinent Vitals/ Pain       Pain Assessment: Faces Faces Pain Scale: Hurts even more Pain Location:  generalized - R shoulder with ex Pain Descriptors / Indicators: Aching;Grimacing;Guarding Pain Intervention(s): Monitored during session;Repositioned;Heat applied  Home Living                                          Prior Functioning/Environment              Frequency  Min 2X/week        Progress Toward Goals  OT Goals(current goals can now be found in the care plan section)  Progress towards OT goals: Not progressing toward goals - comment(self limiting)  Acute Rehab OT Goals Patient Stated Goal: to feel better OT Goal Formulation: With patient Time For Goal Achievement: 02/03/17 Potential to Achieve Goals: Fair  Plan Discharge plan  remains appropriate;Frequency remains appropriate    Co-evaluation    PT/OT/SLP Co-Evaluation/Treatment: Yes Reason for Co-Treatment: For patient/therapist safety;To address functional/ADL transfers;Other (comment)(activity tolerance) PT goals addressed during session: Balance;Strengthening/ROM OT goals addressed during session: ADL's and self-care      AM-PAC PT "6 Clicks" Daily Activity     Outcome Measure   Help from another person eating meals?: A Little Help from another person taking care of personal grooming?: A Little Help from another person toileting, which includes using toliet, bedpan, or urinal?: A Lot Help from another person bathing (including washing, rinsing, drying)?: A Lot Help from another person to put on and taking off regular upper body clothing?: A Lot Help from another person to put on and taking off regular lower body clothing?: A Lot 6 Click Score: 14    End of Session Equipment Utilized During Treatment: Oxygen  OT Visit Diagnosis: Muscle weakness (generalized) (M62.81);Other abnormalities of gait and mobility (R26.89)   Activity Tolerance Patient limited by fatigue(self limiting)   Patient Left in bed;with call bell/phone within reach;with bed alarm set   Nurse Communication Mobility status        Time: 6803-2122 OT Time Calculation (min): 25 min  Charges: OT General Charges $OT Visit: 1 Visit OT Treatments $Self Care/Home Management : 8-22 mins  Sherryl Manges OTR/L [phone]   [employer] Adalei Novell 01/26/2017, 11:41 AM

## 2017-01-26 NOTE — Progress Notes (Signed)
Patient vertebralis better understanding of wearing BiPAP at night after wearing BiPAP for 1.5 hrs today and resting well.Upon entering the room around 1730 today her son and his female friend were in the room. Son was yelling at [employer] on the phone about paying a late bill and new connection stating " my mom is going to to need oxygen when she is discharged from the hospital and I need that money now. Patient smelled of foul odor, hair greasy, and shoes soiled. Girl friend of son kept saying to patient " I am going to take care of you when you come home not like someone who does not know you or care about you." Patient stated that the nurse and staff here had been very nice to her and the tech was going to feed.  Son continue to say that he has been evicted for their old home and now trying to get in a new home but [employer] was not helping. RN has grave concerns for son taking care of patient as POA and not for himself first.  Patient extremely nervous while son and friend in the room.

## 2017-01-27 LAB — BASIC METABOLIC PANEL
ANION GAP: 6 (ref 5–15)
BUN: 28 mg/dL — ABNORMAL HIGH (ref 6–20)
CO2: 43 mmol/L — ABNORMAL HIGH (ref 22–32)
Calcium: 8.8 mg/dL — ABNORMAL LOW (ref 8.9–10.3)
Chloride: 85 mmol/L — ABNORMAL LOW (ref 101–111)
Creatinine, Ser: 0.65 mg/dL (ref 0.44–1.00)
GLUCOSE: 111 mg/dL — AB (ref 65–99)
POTASSIUM: 4.1 mmol/L (ref 3.5–5.1)
SODIUM: 134 mmol/L — AB (ref 135–145)

## 2017-01-27 LAB — CBC
HCT: 35 % — ABNORMAL LOW (ref 36.0–46.0)
Hemoglobin: 11.1 g/dL — ABNORMAL LOW (ref 12.0–15.0)
MCH: 29.5 pg (ref 26.0–34.0)
MCHC: 31.7 g/dL (ref 30.0–36.0)
MCV: 93.1 fL (ref 78.0–100.0)
Platelets: 154 10*3/uL (ref 150–400)
RBC: 3.76 MIL/uL — AB (ref 3.87–5.11)
RDW: 15 % (ref 11.5–15.5)
WBC: 9.6 10*3/uL (ref 4.0–10.5)

## 2017-01-27 LAB — BLOOD GAS, ARTERIAL
Acid-Base Excess: 22.2 mmol/L — ABNORMAL HIGH (ref 0.0–2.0)
BICARBONATE: 48.8 mmol/L — AB (ref 20.0–28.0)
DELIVERY SYSTEMS: POSITIVE
Drawn by: 270271
EXPIRATORY PAP: 6
FIO2: 50
INSPIRATORY PAP: 14
O2 Saturation: 97.3 %
PATIENT TEMPERATURE: 98.6
PH ART: 7.38 (ref 7.350–7.450)
pCO2 arterial: 84.4 mmHg (ref 32.0–48.0)
pO2, Arterial: 93.7 mmHg (ref 83.0–108.0)

## 2017-01-27 LAB — MAGNESIUM: MAGNESIUM: 2.1 mg/dL (ref 1.7–2.4)

## 2017-01-27 LAB — GLUCOSE, CAPILLARY: Glucose-Capillary: 105 mg/dL — ABNORMAL HIGH (ref 65–99)

## 2017-01-27 NOTE — Progress Notes (Signed)
ABG obtained on patient this am after being on bipap all night.  CO2 had increased.  Reported to RN and Consulting civil engineer I changed to a new mask that will make the patient more comfortable and fit her well also.  I also changed her settings increasing her IPAP to 18 to help blow off more CO2.  RT will continue to monitor pt and re check ABG in a couple of hours.

## 2017-01-27 NOTE — Progress Notes (Signed)
Sheila Pierce  CZY:[id number] DOB: [birth year] DOA: 01/19/2017 PCP: [name], MD   Brief Narrative:  72 y.o. WF PMHx   Anxiety, Depression, NSTEMI, Chronic Diastolic CHF, HTN, QT prolongation, HLD, COPD, Chronic resp failure on home O2 3 L via [place],Tobacco abuse, CKD, Diabetes mellitus without complication,  Anemia, Trigeminal neuralgia  Presenting to the emergency department for increased dyspnea.  Patient reports chronic shortness of breath, but notes that this has decreased markedly in recent days point where she is now unable to speak more than 1-2 words without gasping for air.  Denies fevers or chills and denies chest pain or palpitations.   ED Course: Upon arrival to the ED, patient is found to be afebrile, saturating adequately on 3 L/min of supplemental oxygen, tachypneic, tachycardic, and with stable blood pressure.  EKG features a sinus tachycardia with rate 110 and chest x-ray is negative for acute cardiopulmonary disease.  Chemistry panel reveals a slight hyponatremia carbonate of 35.  BUN to creatinine ratio is elevated.  CBC is unremarkable.  ABG reveals pH 7.26, pCO2 88, pO2 102.  Patient was treated with continuous albuterol neb, 2 g IV magnesium, 125 mg IV Solu-Medrol, and started on BiPAP.  She remains hemodynamically stable, remains markedly dyspneic, and will be admitted to the stepdown unit for ongoing evaluation and management of acute exacerbation in COPD.    Subjective: 1/3  A/O 4, positive acute on chronic respiratory distress (improving). Negative CP, negative abdominal pain, negative N/V.     Assessment & Plan:   Active Problems:   CAD (coronary artery disease)   Anxiety   COPD exacerbation (HCC)   Chronic diastolic CHF (congestive heart failure) (HCC)   Acute on chronic respiratory failure with hypercapnia (HCC)   COPD with acute exacerbation (HCC)   Sepsis unspecified organism? -Patient meets criteria on admission HR> 90, RR> 20.  In addition patient has lactic acidosis 2.3 -Completed course Lovenox and Tamiflu -Out of bed to chair for all meals  positive influenza A pneumonia -See sepsis  Acute on Chronic Respiratory Failure with Hypoxia and hypercarbia/COPD exacerbation -DuoNeb TID  -Pulmicort nebulizer - Discontinue Breo-Ellipta patient's poor respiratory status will not allow her to control and sufficient amount of medication  -prednisone 40 mg BID -Mucinex DM -Flutter valve -BiPAP QHS and PRN sleeping (Pt cannot refuse treatment she decompensates). If patient refuses contact on-call physician  -RN instructions ENSURE patient wears BiPAP notify on-call physician when BiPAP discontinued. - 1/2 pulmonary status was not improving so started: Racemic epinephrine 4 doses  Chronic Diastolic CHF (base weight?) -EKG from 01/19/2017 compared to EKG 01/01/2017 no significant change. Sinus tachycardia -Strict in and out since admission -2.8L -Daily weight Filed Weights   01/24/17 0635 01/26/17 0653 01/27/17 0424  Weight: 139 lb 8.8 oz (63.3 kg) 141 lb 12.1 oz (64.3 kg) 137 lb 5.6 oz (62.3 kg)  -Lasix 20 mg daily -Hydralazine PRN -Lisinopril 10 mg daily -Metoprolol 50 mg BID -Metoprolol IV 7.5 mg PRN HR>105  CAD  -Asymptomatic -ASA 81 mg daily -See CHF  Anxiety -BuSpar 15 mg daily -Ativan PRN     DVT prophylaxis: Lovenox Code Status: DNR Family Communication: None Disposition Plan: TBD   Consultants:  None   Procedures/Significant Events:  12/17/16 Echocardiogram: LVEF=:65% to 70%.-Grade 1 diastolic dysfunction). - Atrial septum: There was increased thickness of the septum, c/w lipomatous hypertrophy.  -----------------------------------------------------------------------------------------------------------------------------------    I have personally reviewed and interpreted all radiology studies and my findings are  as above.  VENTILATOR SETTINGS:    Cultures 12/27 MRSA by PCR  negative 12/27 respiratory virus panel positive influenza A     Antimicrobials: Anti-infectives (From admission, onward)   Start     Stop   01/22/17 1200  levofloxacin (LEVAQUIN) tablet 750 mg  Status:  Discontinued     01/24/17 1622   01/21/17 0945  oseltamivir (TAMIFLU) capsule 30 mg     01/26/17 0959   01/20/17 1200  levofloxacin (LEVAQUIN) IVPB 750 mg  Status:  Discontinued     01/22/17 1119        Devices  LINES / TUBES:      Continuous Infusions:    Objective: Vitals:   01/27/17 0424 01/27/17 0716 01/27/17 0723 01/27/17 0724  BP:  121/64 121/64   Pulse: 83  82   Resp: (!) 21  19   Temp: 97.7 F (36.5 C) 97.8 F (36.6 C)    TempSrc: Axillary Oral    SpO2: 100%  100% 100%  Weight: 137 lb 5.6 oz (62.3 kg)     Height:        Intake/Output Summary (Last 24 hours) at 01/27/2017 0847 Last data filed at 01/26/2017 2200 Gross per 24 hour  Intake 718 ml  Output 1000 ml  Net -282 ml   Filed Weights   01/24/17 0635 01/26/17 0653 01/27/17 0424  Weight: 139 lb 8.8 oz (63.3 kg) 141 lb 12.1 oz (64.3 kg) 137 lb 5.6 oz (62.3 kg)     Physical Exam:  General: A/O 4, positive acute on chronic respiratory distress (improving)  Neck:  Negative scars, masses, torticollis, lymphadenopathy, JVD Lungs: diffuse poor air movement (improved) negative first breathing negative tachypnea, negative  wheezes or crackles Cardiovascular: Regular rate and rhythm without murmur gallop or rub normal S1 and S2 Abdomen: negative abdominal pain, nondistended, positive soft, bowel sounds, no rebound, no ascites, no appreciable mass Extremities: No significant cyanosis, clubbing, or edema bilateral lower extremities Skin: Negative rashes, lesions, ulcers Psychiatric:  Negative depression, negative anxiety, negative fatigue, negative mania  Central nervous system:  Cranial nerves II through XII intact, tongue/uvula midline, all extremities muscle strength 5/5, sensation intact throughout,  negative dysarthria, negative expressive aphasia, negative receptive aphasia.   Data Reviewed: Care during the described time interval was provided by me .  I have reviewed this patient's available data, including medical history, events of note, physical examination, and all test results as part of my evaluation.   CBC: Recent Labs  Lab 01/22/17 0513 01/23/17 0514 01/24/17 0412 01/25/17 0429 01/26/17 0841  WBC 20.9* 9.2 8.7 8.5 9.5  HGB 10.4* 10.8* 11.3* 10.6* 10.8*  HCT 35.6* 36.9 36.3 34.9* 35.7*  MCV 99.7 97.9 95.0 95.1 94.4  PLT 228 198 163 173 155   Basic Metabolic Panel: Recent Labs  Lab 01/22/17 0513 01/23/17 0514 01/24/17 0412 01/25/17 0429 01/26/17 0841 01/27/17 0619  NA 138 137 138 134* 136 134*  K 5.4* 4.4 4.4 4.1 3.8 4.1  CL 94* 91* 95* 89* 84* 85*  CO2 37* 37* 34* 39* 44* 43*  GLUCOSE 126* 167* 127* 109* 112* 111*  BUN 24* 24* 25* 25* 25* 28*  CREATININE 0.68 0.69 0.63 0.62 0.70 0.65  CALCIUM 8.8* 8.7* 8.6* 8.9 9.1 8.8*  MG 2.2 2.0 1.9  --  1.9 2.1   GFR: Estimated Creatinine Clearance: 52.4 mL/min (by C-G formula based on SCr of 0.65 mg/dL). Liver Function Tests: No results for input(s): AST, ALT, ALKPHOS, BILITOT, PROT, ALBUMIN in the  last 168 hours. No results for input(s): LIPASE, AMYLASE in the last 168 hours. No results for input(s): AMMONIA in the last 168 hours. Coagulation Profile: No results for input(s): INR, PROTIME in the last 168 hours. Cardiac Enzymes: No results for input(s): CKTOTAL, CKMB, CKMBINDEX, TROPONINI in the last 168 hours. BNP (last 3 results) No results for input(s): PROBNP in the last 8760 hours. HbA1C: No results for input(s): HGBA1C in the last 72 hours. CBG: Recent Labs  Lab 01/23/17 0736 01/24/17 0838 01/25/17 0832 01/26/17 0805 01/27/17 0715  GLUCAP 127* 123* 112* 101* 105*   Lipid Profile: No results for input(s): CHOL, HDL, LDLCALC, TRIG, CHOLHDL, LDLDIRECT in the last 72 hours. Thyroid Function  Tests: No results for input(s): TSH, T4TOTAL, FREET4, T3FREE, THYROIDAB in the last 72 hours. Anemia Panel: No results for input(s): VITAMINB12, FOLATE, FERRITIN, TIBC, IRON, RETICCTPCT in the last 72 hours. Urine analysis:    Component Value Date/Time   COLORURINE YELLOW 04/16/2016 1920   APPEARANCEUR HAZY (A) 04/16/2016 1920   LABSPEC 1.006 04/16/2016 1920   PHURINE 6.0 04/16/2016 1920   GLUCOSEU NEGATIVE 04/16/2016 1920   HGBUR SMALL (A) 04/16/2016 1920   BILIRUBINUR neg 11/24/2016 1131   KETONESUR NEGATIVE 04/16/2016 1920   PROTEINUR neg 11/24/2016 1131   PROTEINUR NEGATIVE 04/16/2016 1920   UROBILINOGEN 0.2 11/24/2016 1131   NITRITE neg 11/24/2016 1131   NITRITE POSITIVE (A) 04/16/2016 1920   LEUKOCYTESUR Small (1+) (A) 11/24/2016 1131   Sepsis Labs: @LABRCNTIP (procalcitonin:4,lacticidven:4)  ) Recent Results (from the past 240 hour(s))  MRSA PCR Screening     Status: None   Collection Time: 01/20/17  1:15 AM  Result Value Ref Range Status   MRSA by PCR NEGATIVE NEGATIVE Final    Comment:        The GeneXpert MRSA Assay (FDA approved for NASAL specimens only), is one component of a comprehensive MRSA colonization surveillance program. It is not intended to diagnose MRSA infection nor to guide or monitor treatment for MRSA infections.   Respiratory Panel by PCR     Status: Abnormal   Collection Time: 01/20/17  9:31 AM  Result Value Ref Range Status   Adenovirus NOT DETECTED NOT DETECTED Final   Coronavirus 229E NOT DETECTED NOT DETECTED Final   Coronavirus HKU1 NOT DETECTED NOT DETECTED Final   Coronavirus NL63 NOT DETECTED NOT DETECTED Final   Coronavirus OC43 NOT DETECTED NOT DETECTED Final   Metapneumovirus NOT DETECTED NOT DETECTED Final   Rhinovirus / Enterovirus NOT DETECTED NOT DETECTED Final   Influenza A H1 2009 DETECTED (A) NOT DETECTED Final   Influenza B NOT DETECTED NOT DETECTED Final   Parainfluenza Virus 1 NOT DETECTED NOT DETECTED Final    Parainfluenza Virus 2 NOT DETECTED NOT DETECTED Final   Parainfluenza Virus 3 NOT DETECTED NOT DETECTED Final   Parainfluenza Virus 4 NOT DETECTED NOT DETECTED Final   Respiratory Syncytial Virus NOT DETECTED NOT DETECTED Final   Bordetella pertussis NOT DETECTED NOT DETECTED Final   Chlamydophila pneumoniae NOT DETECTED NOT DETECTED Final   Mycoplasma pneumoniae NOT DETECTED NOT DETECTED Final  Culture, expectorated sputum-assessment     Status: None   Collection Time: 01/20/17  4:21 PM  Result Value Ref Range Status   Specimen Description EXPECTORATED SPUTUM  Final   Special Requests NONE  Final   Sputum evaluation THIS SPECIMEN IS ACCEPTABLE FOR SPUTUM CULTURE  Final   Report Status 01/20/2017 FINAL  Final  Culture, respiratory (NON-Expectorated)     Status: None  Collection Time: 01/20/17  4:21 PM  Result Value Ref Range Status   Specimen Description EXPECTORATED SPUTUM  Final   Special Requests NONE Reflexed from [id number]  Final   Gram Stain   Final    ABUNDANT WBC PRESENT, PREDOMINANTLY PMN RARE GRAM POSITIVE COCCI IN CHAINS RARE GRAM NEGATIVE COCCI IN PAIRS NO SQUAMOUS EPITHELIAL CELLS SEEN    Culture Consistent with normal respiratory flora.  Final   Report Status 01/23/2017 FINAL  Final         Radiology Studies: No results found.      Scheduled Meds: . aspirin EC  81 mg Oral Daily  . budesonide (PULMICORT) nebulizer solution  0.5 mg Nebulization BID  . busPIRone  15 mg Oral BID  . chlorhexidine  15 mL Mouth Rinse BID  . cholecalciferol  1,000 Units Oral Daily  . enoxaparin (LOVENOX) injection  40 mg Subcutaneous Q24H  . furosemide  20 mg Oral Daily  . ipratropium-albuterol  3 mL Nebulization TID  . lisinopril  10 mg Oral Daily  . mouth rinse  15 mL Mouth Rinse q12n4p  . metoprolol tartrate  50 mg Oral BID  . multivitamin with minerals  1 tablet Oral Daily  . potassium chloride  10 mEq Oral Daily  . predniSONE  40 mg Oral BID WC  . sodium chloride flush   3 mL Intravenous Q12H   Continuous Infusions:    LOS: 8 days    Time spent: 40 minutes    Jaydin Boniface, Roselind Messier, MD [facility] Pager [phone]   If 7PM-7AM, please contact night-coverage [email or web address] Password [facility] 01/27/2017, 8:47 AM

## 2017-01-27 NOTE — Progress Notes (Signed)
Acute resp failure, postive flu, copd ex, chronic chf, conts on bipap, iv ativan for anxiety. Plan is for SNF.  She previously refused, CSW to speak with family is still refusing then will need HH (HRI with [facility]), discussed in LOS 1/3.

## 2017-01-27 NOTE — Progress Notes (Signed)
CSW spoke with pt regarding PT recommendation for SNF and reports that pt now agreeable to SNF placement  Pt not sure what she wants to do- states her son has been working towards getting additional help at home and requests I call him and she will do whatever he wants to  CSW called pt son to discuss- informed of recommendation for SNF but pt son unwilling to speak right now- states he will be in hospital between 12-2pm tomorrow and that I will need to come speak with him then.  CSW will continue to follow  Burna Sis, LCSW Clinical Social Worker [phone]

## 2017-01-27 NOTE — Progress Notes (Signed)
Pt placed on bipap for night time rest. Tolerating well no issues to report at this time.

## 2017-01-28 DIAGNOSIS — J111 Influenza due to unidentified influenza virus with other respiratory manifestations: Secondary | ICD-10-CM

## 2017-01-28 DIAGNOSIS — R0603 Acute respiratory distress: Secondary | ICD-10-CM

## 2017-01-28 LAB — CBC
HCT: 35.2 % — ABNORMAL LOW (ref 36.0–46.0)
Hemoglobin: 10.3 g/dL — ABNORMAL LOW (ref 12.0–15.0)
MCH: 28 pg (ref 26.0–34.0)
MCHC: 29.3 g/dL — ABNORMAL LOW (ref 30.0–36.0)
MCV: 95.7 fL (ref 78.0–100.0)
PLATELETS: 169 10*3/uL (ref 150–400)
RBC: 3.68 MIL/uL — ABNORMAL LOW (ref 3.87–5.11)
RDW: 14.7 % (ref 11.5–15.5)
WBC: 11.9 10*3/uL — ABNORMAL HIGH (ref 4.0–10.5)

## 2017-01-28 LAB — BASIC METABOLIC PANEL
Anion gap: 6 (ref 5–15)
BUN: 39 mg/dL — AB (ref 6–20)
CHLORIDE: 86 mmol/L — AB (ref 101–111)
CO2: 43 mmol/L — ABNORMAL HIGH (ref 22–32)
CREATININE: 0.73 mg/dL (ref 0.44–1.00)
Calcium: 9 mg/dL (ref 8.9–10.3)
GFR calc Af Amer: >60 mL/min (ref >60)
GFR calc non Af Amer: >60 mL/min (ref >60)
Glucose, Bld: 149 mg/dL — ABNORMAL HIGH (ref 65–99)
Potassium: 4.6 mmol/L (ref 3.5–5.1)
Sodium: 135 mmol/L (ref 135–145)

## 2017-01-28 LAB — MAGNESIUM: Magnesium: 2.1 mg/dL (ref 1.7–2.4)

## 2017-01-28 LAB — GLUCOSE, CAPILLARY: Glucose-Capillary: 108 mg/dL — ABNORMAL HIGH (ref 65–99)

## 2017-01-28 MED ORDER — PREDNISONE 20 MG PO TABS
40.0000 mg | ORAL_TABLET | Freq: Every day | ORAL | Status: DC
  Administered 2017-01-29: 40 mg via ORAL
  Filled 2017-01-28: qty 2

## 2017-01-28 MED ORDER — LORAZEPAM 2 MG/ML IJ SOLN
0.5000 mg | Freq: Four times a day (QID) | INTRAMUSCULAR | Status: DC | PRN
  Administered 2017-01-29 – 2017-02-04 (×6): 0.5 mg via INTRAVENOUS
  Filled 2017-01-28 (×7): qty 1

## 2017-01-28 NOTE — Progress Notes (Signed)
CSW spoke with pt son at bedside- initially only interested in [facility] who has declined patient- states he will take patient home since they cannot accept  CSW then received notification from RN that family inquiring about [facility] in [place]- CSW had facility coordinator meet with son and pt and confirm interest  They are agreeable to [facility] and [facility] has initiated [employer] auth  Burna Sis, LCSW Clinical Social Worker [phone]

## 2017-01-28 NOTE — Progress Notes (Signed)
Pt taken off bipap and placed on 3L [place] at this time. Pt with strong congested cough, encouraged to spit out secretions

## 2017-01-28 NOTE — Progress Notes (Signed)
Completion of Advanced Directive.  Copy placed on chart.  Original given to son.    01/28/17 1526  Clinical Encounter Type  Visited With Patient;Family  Visit Type Initial;Spiritual support  Spiritual Encounters  Spiritual Needs Literature (Advanced Directive Completion)  Advance Directives (For Healthcare)  Does Patient Have a Medical Advance Directive? Yes;No  Type of Estate agent of [employer]

## 2017-01-28 NOTE — Progress Notes (Signed)
Physical Therapy Treatment Patient Details Name: Sheila Pierce MRN: [id number] DOB: August 04, 1945 Today's Date: 01/28/2017    History of Present Illness Pt is a 72 y/o female with medical history significant for chronic diastolic CHF, anxiety, and COPD with chronic hypercarbic and hypoxic respiratory failure, now presenting to the emergency department for increased dyspnea secondary to a COPD exacerbation    PT Comments    Pt remains very limited with functional mobility secondary to fatigue and can be self-limiting. Pt continues to require heavy physical assistance of two for bed mobility and transfers. Pt's son present during session and reporting that he wants to take pt home. At end of session, pt's SPO2 decreased to mid 80's with slow recovery to low 90's with focus on deep breathing. Pt would continue to benefit from skilled physical therapy services at this time while admitted and after d/c to address the below listed limitations in order to improve overall safety and independence with functional mobility.    Follow Up Recommendations  SNF;Supervision/Assistance - 24 hour     Equipment Recommendations  None recommended by PT    Recommendations for Other Services       Precautions / Restrictions Precautions Precautions: Fall Precaution Comments: watch SPO2, DROPLET Restrictions Weight Bearing Restrictions: No    Mobility  Bed Mobility Overal bed mobility: Needs Assistance Bed Mobility: Sit to Supine       Sit to supine: Max assist;+2 for physical assistance   General bed mobility comments: assist to lower trunk and with bilateral LEs to return to bed; total A x2 to reposition in bed  Transfers Overall transfer level: Needs assistance Equipment used: 2 person hand held assist Transfers: Sit to/from [employer] Sit to Stand: Mod assist;+2 physical assistance Stand pivot transfers: Max assist;+2 physical assistance       General transfer comment:  increased time and effort, cueing for technique, therapist blocking knees as they were buckling somewhat with pivotal movements  Ambulation/Gait                 Stairs            Wheelchair Mobility    Modified Rankin (Stroke Patients Only)       Balance Overall balance assessment: Needs assistance Sitting-balance support: Feet supported Sitting balance-Leahy Scale: Fair     Standing balance support: Bilateral upper extremity supported;During functional activity Standing balance-Leahy Scale: Poor Standing balance comment: reliant on external supports                            Cognition Arousal/Alertness: Awake/alert Behavior During Therapy: Anxious;Flat affect Overall Cognitive Status: Impaired/Different from baseline Area of Impairment: Safety/judgement;Problem solving                         Safety/Judgement: Decreased awareness of safety;Decreased awareness of deficits   Problem Solving: Slow processing;Decreased initiation;Difficulty sequencing;Requires verbal cues;Requires tactile cues        Exercises      General Comments        Pertinent Vitals/Pain Pain Assessment: No/denies pain    Home Living                      Prior Function            PT Goals (current goals can now be found in the care plan section) Acute Rehab PT Goals PT Goal Formulation: With patient Time  For Goal Achievement: 02/03/17 Potential to Achieve Goals: Fair Progress towards PT goals: Progressing toward goals    Frequency    Min 2X/week      PT Plan Current plan remains appropriate    Co-evaluation              AM-PAC PT "6 Clicks" Daily Activity  Outcome Measure  Difficulty turning over in bed (including adjusting bedclothes, sheets and blankets)?: Unable Difficulty moving from lying on back to sitting on the side of the bed? : Unable Difficulty sitting down on and standing up from a chair with arms (e.g.,  wheelchair, bedside commode, etc,.)?: Unable Help needed moving to and from a bed to chair (including a wheelchair)?: A Lot Help needed walking in hospital room?: Total Help needed climbing 3-5 steps with a railing? : Total 6 Click Score: 7    End of Session Equipment Utilized During Treatment: Oxygen;Gait belt Activity Tolerance: Patient limited by fatigue Patient left: in bed;with call bell/phone within reach;with family/visitor present Nurse Communication: Mobility status PT Visit Diagnosis: Other abnormalities of gait and mobility (R26.89)     Time: 7035-0093 PT Time Calculation (min) (ACUTE ONLY): 18 min  Charges:  $Therapeutic Activity: 8-22 mins                    G Codes:       Overly, PT, DPT [phone]    Alessandra Bevels Tehillah Cipriani 01/28/2017, 3:12 PM

## 2017-01-28 NOTE — Progress Notes (Signed)
[place] TEAM 1 - Stepdown/ICU TEAM  Sheila Pierce  ZOX:[id number] DOB: July 28, 1945 DOA: 01/19/2017 PCP: [name], MD    Brief Narrative:  72 y.o.F Hx  Anxiety, Depression, NSTEMI, Chronic Diastolic CHF, HTN, QT prolongation, HLD, COPD, Chronic resp failure on home O2 3 L via [place], Tobacco abuse, CKD, DM, Anemia, and Trigeminal neuralgia who presented to the ED w/ dyspnea, unable to speak more than 1-2 words without gasping for air.   In the ED her ABG revealed pH 7.26, pCO288,pO2102. Patient was treated with continuous albuterol neb, 2 g IV magnesium, 125 mg IV Solu-Medrol, and started on BiPAP.   Significant Events: 12/26 admit  Subjective: The patient is resting comfortably in bed.  She has no new complaints today.  She denies chest pain nausea vomiting or shortness of breath at rest.  Assessment & Plan:  Sepsis due to Influenza A Completed course of Tamiflu - clinically improving - awaiting bed in a SNF facility for rehab   Acute hypercarbic resp failure on chronic hypoxic resp failure - acute COPD exacerbation Slowly improving - no signif distress at this time - cont to wean steroids - increase activity - continue nightly CPAP support   Chronic grade 1 diastolic CHF EF 65-70% via TTE Nov 2018 - no significant volume overload on exam at this time - resumed home medical regimen   Filed Weights   01/26/17 0653 01/27/17 0424 01/28/17 0410  Weight: 64.3 kg (141 lb 12.1 oz) 62.3 kg (137 lb 5.6 oz) 62.1 kg (136 lb 14.5 oz)   CAD Asymptomatic  Anxiety  Well controlled at this time  DVT prophylaxis: lovenox  Code Status: DNR - NO CODE Family Communication: no family present at time of exam  Disposition Plan: ready for SNF - awaiting insurance authorization - lives in [place] so prefers SNF in [place] or [place]   Consultants:  none  Antimicrobials:  none  Objective: Blood pressure (!) 148/71, pulse 99, temperature 98.5 F (36.9 C), temperature source Oral,  resp. rate (!) 21, height 5' (1.524 m), weight 62.1 kg (136 lb 14.5 oz), SpO2 98 %.  Intake/Output Summary (Last 24 hours) at 01/28/2017 1525 Last data filed at 01/28/2017 1326 Gross per 24 hour  Intake 1323 ml  Output 1650 ml  Net -327 ml   Filed Weights   01/26/17 0653 01/27/17 0424 01/28/17 0410  Weight: 64.3 kg (141 lb 12.1 oz) 62.3 kg (137 lb 5.6 oz) 62.1 kg (136 lb 14.5 oz)    Examination: General: no acute distress  Lungs: no wheezing  Cardiovascular: Regular rate and rhythm Abdomen: NT/ND, soft, bs+ Extremities: No edema B LE   CBC: Recent Labs  Lab 01/24/17 0412 01/25/17 0429 01/26/17 0841 01/27/17 0619 01/28/17 0518  WBC 8.7 8.5 9.5 9.6 11.9*  HGB 11.3* 10.6* 10.8* 11.1* 10.3*  HCT 36.3 34.9* 35.7* 35.0* 35.2*  MCV 95.0 95.1 94.4 93.1 95.7  PLT 163 173 155 154 169   Basic Metabolic Panel: Recent Labs  Lab 01/23/17 0514 01/24/17 0412 01/25/17 0429 01/26/17 0841 01/27/17 0619 01/28/17 0518  NA 137 138 134* 136 134* 135  K 4.4 4.4 4.1 3.8 4.1 4.6  CL 91* 95* 89* 84* 85* 86*  CO2 37* 34* 39* 44* 43* 43*  GLUCOSE 167* 127* 109* 112* 111* 149*  BUN 24* 25* 25* 25* 28* 39*  CREATININE 0.69 0.63 0.62 0.70 0.65 0.73  CALCIUM 8.7* 8.6* 8.9 9.1 8.8* 9.0  MG 2.0 1.9  --  1.9 2.1  2.1   GFR: Estimated Creatinine Clearance: 52.3 mL/min (by C-G formula based on SCr of 0.73 mg/dL).  HbA1C: Hgb A1c MFr Bld  Date/Time Value Ref Range Status  12/17/2016 05:53 AM 5.5 4.8 - 5.6 % Final    Comment:    (NOTE)         Prediabetes: 5.7 - 6.4         Diabetes: >6.4         Glycemic control for adults with diabetes: <7.0   11/11/2016 09:44 AM 5.2 4.8 - 5.6 % Final    Comment:             Prediabetes: 5.7 - 6.4          Diabetes: >6.4          Glycemic control for adults with diabetes: <7.0     CBG: Recent Labs  Lab 01/24/17 0838 01/25/17 0832 01/26/17 0805 01/27/17 0715 01/28/17 0724  GLUCAP 123* 112* 101* 105* 108*    Recent Results (from the past 240  hour(s))  MRSA PCR Screening     Status: None   Collection Time: 01/20/17  1:15 AM  Result Value Ref Range Status   MRSA by PCR NEGATIVE NEGATIVE Final    Comment:        The GeneXpert MRSA Assay (FDA approved for NASAL specimens only), is one component of a comprehensive MRSA colonization surveillance program. It is not intended to diagnose MRSA infection nor to guide or monitor treatment for MRSA infections.   Respiratory Panel by PCR     Status: Abnormal   Collection Time: 01/20/17  9:31 AM  Result Value Ref Range Status   Adenovirus NOT DETECTED NOT DETECTED Final   Coronavirus 229E NOT DETECTED NOT DETECTED Final   Coronavirus HKU1 NOT DETECTED NOT DETECTED Final   Coronavirus NL63 NOT DETECTED NOT DETECTED Final   Coronavirus OC43 NOT DETECTED NOT DETECTED Final   Metapneumovirus NOT DETECTED NOT DETECTED Final   Rhinovirus / Enterovirus NOT DETECTED NOT DETECTED Final   Influenza A H1 2009 DETECTED (A) NOT DETECTED Final   Influenza B NOT DETECTED NOT DETECTED Final   Parainfluenza Virus 1 NOT DETECTED NOT DETECTED Final   Parainfluenza Virus 2 NOT DETECTED NOT DETECTED Final   Parainfluenza Virus 3 NOT DETECTED NOT DETECTED Final   Parainfluenza Virus 4 NOT DETECTED NOT DETECTED Final   Respiratory Syncytial Virus NOT DETECTED NOT DETECTED Final   Bordetella pertussis NOT DETECTED NOT DETECTED Final   Chlamydophila pneumoniae NOT DETECTED NOT DETECTED Final   Mycoplasma pneumoniae NOT DETECTED NOT DETECTED Final  Culture, expectorated sputum-assessment     Status: None   Collection Time: 01/20/17  4:21 PM  Result Value Ref Range Status   Specimen Description EXPECTORATED SPUTUM  Final   Special Requests NONE  Final   Sputum evaluation THIS SPECIMEN IS ACCEPTABLE FOR SPUTUM CULTURE  Final   Report Status 01/20/2017 FINAL  Final  Culture, respiratory (NON-Expectorated)     Status: None   Collection Time: 01/20/17  4:21 PM  Result Value Ref Range Status   Specimen  Description EXPECTORATED SPUTUM  Final   Special Requests NONE Reflexed from [id number]  Final   Gram Stain   Final    ABUNDANT WBC PRESENT, PREDOMINANTLY PMN RARE GRAM POSITIVE COCCI IN CHAINS RARE GRAM NEGATIVE COCCI IN PAIRS NO SQUAMOUS EPITHELIAL CELLS SEEN    Culture Consistent with normal respiratory flora.  Final   Report Status 01/23/2017 FINAL  Final  Scheduled Meds: . aspirin EC  81 mg Oral Daily  . budesonide (PULMICORT) nebulizer solution  0.5 mg Nebulization BID  . busPIRone  15 mg Oral BID  . chlorhexidine  15 mL Mouth Rinse BID  . cholecalciferol  1,000 Units Oral Daily  . enoxaparin (LOVENOX) injection  40 mg Subcutaneous Q24H  . furosemide  20 mg Oral Daily  . ipratropium-albuterol  3 mL Nebulization TID  . lisinopril  10 mg Oral Daily  . mouth rinse  15 mL Mouth Rinse q12n4p  . metoprolol tartrate  50 mg Oral BID  . multivitamin with minerals  1 tablet Oral Daily  . potassium chloride  10 mEq Oral Daily  . predniSONE  40 mg Oral BID WC  . sodium chloride flush  3 mL Intravenous Q12H     LOS: 9 days   Lonia Blood, MD [facility] Office  [phone] Pager - Text Page per Loretha Stapler as per below:  On-Call/Text Page:      [email or web address]      password [email or web address]  If 7PM-7AM, please contact night-coverage [email or web address] Password [email or web address] 01/28/2017, 3:25 PM

## 2017-01-29 LAB — GLUCOSE, CAPILLARY: Glucose-Capillary: 88 mg/dL (ref 65–99)

## 2017-01-29 NOTE — Progress Notes (Signed)
Patient was able to sit up in the chair at the bedside after eating lunch through dinner without difficult.  Patient wanted to go back  bed however, with nurse tech encouragement she did stay in the chair.  Imperative patient goes on the BiPAP tonight to help increase her activity during the day.

## 2017-01-29 NOTE — Progress Notes (Signed)
[place] TEAM 1 - Stepdown/ICU TEAM  LYNZIE CLIBURN  ZOX:[id number] DOB: 1945-12-08 DOA: 01/19/2017 PCP: [name], MD    Brief Narrative:  72 y.o.F Hx  Anxiety, Depression, NSTEMI, Chronic Diastolic CHF, HTN, QT prolongation, HLD, COPD, Chronic resp failure on home O2 3 L via , Tobacco abuse, CKD, DM, Anemia, and Trigeminal neuralgia who presented to the ED w/ dyspnea, unable to speak more than 1-2 words without gasping for air.   In the ED her ABG revealed pH 7.26, pCO288,pO2102. Patient was treated with continuous albuterol neb, 2 g IV magnesium, 125 mg IV Solu-Medrol, and started on BiPAP.   Significant Events: 12/26 admit  Subjective: Resting comfortably in a bedside chair.  No complaints today.  Reports she is ready for rehab.    Assessment & Plan:  Sepsis due to Influenza A Completed course of Tamiflu - clinically improving - awaiting bed in a SNF facility for rehab   Acute hypercarbic resp failure on chronic hypoxic resp failure - acute COPD exacerbation Slowly improving - no signif distress at this time - cont to wean steroids - increase activity - continue nightly CPAP support when she will allow (refused last night)  Chronic grade 1 diastolic CHF EF 65-70% via TTE Nov 2018 - no significant volume overload on exam at this time - resumed home medical regimen   Filed Weights   01/27/17 0424 01/28/17 0410 01/29/17 0454  Weight: 62.3 kg (137 lb 5.6 oz) 62.1 kg (136 lb 14.5 oz) 62.1 kg (136 lb 14.5 oz)   CAD Asymptomatic  Anxiety  Well controlled at this time  DVT prophylaxis: lovenox  Code Status: DNR - NO CODE Family Communication: no family present at time of exam  Disposition Plan: ready for SNF - awaiting insurance authorization - lives in [place] so prefers SNF in [place] or [place]   Consultants:  none  Antimicrobials:  none  Objective: Blood pressure (!) 110/52, pulse 83, temperature 98.2 F (36.8 C), temperature source Oral, resp. rate  11, height 5' (1.524 m), weight 62.1 kg (136 lb 14.5 oz), SpO2 99 %.  Intake/Output Summary (Last 24 hours) at 01/29/2017 1520 Last data filed at 01/29/2017 0920 Gross per 24 hour  Intake 940 ml  Output 900 ml  Net 40 ml   Filed Weights   01/27/17 0424 01/28/17 0410 01/29/17 0454  Weight: 62.3 kg (137 lb 5.6 oz) 62.1 kg (136 lb 14.5 oz) 62.1 kg (136 lb 14.5 oz)    Examination: General: no acute distress  Lungs: no wheezing  Cardiovascular: Regular rate and rhythm Extremities: No edema B LE   CBC: Recent Labs  Lab 01/24/17 0412 01/25/17 0429 01/26/17 0841 01/27/17 0619 01/28/17 0518  WBC 8.7 8.5 9.5 9.6 11.9*  HGB 11.3* 10.6* 10.8* 11.1* 10.3*  HCT 36.3 34.9* 35.7* 35.0* 35.2*  MCV 95.0 95.1 94.4 93.1 95.7  PLT 163 173 155 154 169   Basic Metabolic Panel: Recent Labs  Lab 01/23/17 0514 01/24/17 0412 01/25/17 0429 01/26/17 0841 01/27/17 0619 01/28/17 0518  NA 137 138 134* 136 134* 135  K 4.4 4.4 4.1 3.8 4.1 4.6  CL 91* 95* 89* 84* 85* 86*  CO2 37* 34* 39* 44* 43* 43*  GLUCOSE 167* 127* 109* 112* 111* 149*  BUN 24* 25* 25* 25* 28* 39*  CREATININE 0.69 0.63 0.62 0.70 0.65 0.73  CALCIUM 8.7* 8.6* 8.9 9.1 8.8* 9.0  MG 2.0 1.9  --  1.9 2.1 2.1   GFR: Estimated Creatinine Clearance:  52.3 mL/min (by C-G formula based on SCr of 0.73 mg/dL).  HbA1C: Hgb A1c MFr Bld  Date/Time Value Ref Range Status  12/17/2016 05:53 AM 5.5 4.8 - 5.6 % Final    Comment:    (NOTE)         Prediabetes: 5.7 - 6.4         Diabetes: >6.4         Glycemic control for adults with diabetes: <7.0   11/11/2016 09:44 AM 5.2 4.8 - 5.6 % Final    Comment:             Prediabetes: 5.7 - 6.4          Diabetes: >6.4          Glycemic control for adults with diabetes: <7.0     CBG: Recent Labs  Lab 01/25/17 0832 01/26/17 0805 01/27/17 0715 01/28/17 0724 01/29/17 0828  GLUCAP 112* 101* 105* 108* 88    Recent Results (from the past 240 hour(s))  MRSA PCR Screening     Status: None     Collection Time: 01/20/17  1:15 AM  Result Value Ref Range Status   MRSA by PCR NEGATIVE NEGATIVE Final    Comment:        The GeneXpert MRSA Assay (FDA approved for NASAL specimens only), is one component of a comprehensive MRSA colonization surveillance program. It is not intended to diagnose MRSA infection nor to guide or monitor treatment for MRSA infections.   Respiratory Panel by PCR     Status: Abnormal   Collection Time: 01/20/17  9:31 AM  Result Value Ref Range Status   Adenovirus NOT DETECTED NOT DETECTED Final   Coronavirus 229E NOT DETECTED NOT DETECTED Final   Coronavirus HKU1 NOT DETECTED NOT DETECTED Final   Coronavirus NL63 NOT DETECTED NOT DETECTED Final   Coronavirus OC43 NOT DETECTED NOT DETECTED Final   Metapneumovirus NOT DETECTED NOT DETECTED Final   Rhinovirus / Enterovirus NOT DETECTED NOT DETECTED Final   Influenza A H1 2009 DETECTED (A) NOT DETECTED Final   Influenza B NOT DETECTED NOT DETECTED Final   Parainfluenza Virus 1 NOT DETECTED NOT DETECTED Final   Parainfluenza Virus 2 NOT DETECTED NOT DETECTED Final   Parainfluenza Virus 3 NOT DETECTED NOT DETECTED Final   Parainfluenza Virus 4 NOT DETECTED NOT DETECTED Final   Respiratory Syncytial Virus NOT DETECTED NOT DETECTED Final   Bordetella pertussis NOT DETECTED NOT DETECTED Final   Chlamydophila pneumoniae NOT DETECTED NOT DETECTED Final   Mycoplasma pneumoniae NOT DETECTED NOT DETECTED Final  Culture, expectorated sputum-assessment     Status: None   Collection Time: 01/20/17  4:21 PM  Result Value Ref Range Status   Specimen Description EXPECTORATED SPUTUM  Final   Special Requests NONE  Final   Sputum evaluation THIS SPECIMEN IS ACCEPTABLE FOR SPUTUM CULTURE  Final   Report Status 01/20/2017 FINAL  Final  Culture, respiratory (NON-Expectorated)     Status: None   Collection Time: 01/20/17  4:21 PM  Result Value Ref Range Status   Specimen Description EXPECTORATED SPUTUM  Final    Special Requests NONE Reflexed from [id number]  Final   Gram Stain   Final    ABUNDANT WBC PRESENT, PREDOMINANTLY PMN RARE GRAM POSITIVE COCCI IN CHAINS RARE GRAM NEGATIVE COCCI IN PAIRS NO SQUAMOUS EPITHELIAL CELLS SEEN    Culture Consistent with normal respiratory flora.  Final   Report Status 01/23/2017 FINAL  Final     Scheduled Meds: .  aspirin EC  81 mg Oral Daily  . budesonide (PULMICORT) nebulizer solution  0.5 mg Nebulization BID  . busPIRone  15 mg Oral BID  . chlorhexidine  15 mL Mouth Rinse BID  . cholecalciferol  1,000 Units Oral Daily  . enoxaparin (LOVENOX) injection  40 mg Subcutaneous Q24H  . furosemide  20 mg Oral Daily  . ipratropium-albuterol  3 mL Nebulization TID  . lisinopril  10 mg Oral Daily  . mouth rinse  15 mL Mouth Rinse q12n4p  . metoprolol tartrate  50 mg Oral BID  . multivitamin with minerals  1 tablet Oral Daily  . potassium chloride  10 mEq Oral Daily  . predniSONE  40 mg Oral Q breakfast  . sodium chloride flush  3 mL Intravenous Q12H     LOS: 10 days   Lonia Blood, MD [facility] Office  [phone] Pager - Text Page per Loretha Stapler as per below:  On-Call/Text Page:      [email or web address]      password [email or web address]  If 7PM-7AM, please contact night-coverage [email or web address] Password [email or web address] 01/29/2017, 3:20 PM

## 2017-01-29 NOTE — Progress Notes (Signed)
PT refused BIPAP tonight. RN present in the room at time. BIPAP at bedside. RT to cont to monitor.

## 2017-01-29 NOTE — Progress Notes (Signed)
Pt refused to put on bipap. RT in the room present when patient refused.

## 2017-01-30 ENCOUNTER — Inpatient Hospital Stay (HOSPITAL_COMMUNITY): Payer: Medicare HMO

## 2017-01-30 DIAGNOSIS — J431 Panlobular emphysema: Secondary | ICD-10-CM

## 2017-01-30 LAB — BLOOD GAS, ARTERIAL
Acid-Base Excess: 17.5 mmol/L — ABNORMAL HIGH (ref 0.0–2.0)
Bicarbonate: 44.8 mmol/L — ABNORMAL HIGH (ref 20.0–28.0)
DRAWN BY: 246861
O2 CONTENT: 3 L/min
O2 SAT: 94.7 %
PATIENT TEMPERATURE: 98.6
pCO2 arterial: 96.4 mmHg (ref 32.0–48.0)
pH, Arterial: 7.289 — ABNORMAL LOW (ref 7.350–7.450)
pO2, Arterial: 75.4 mmHg — ABNORMAL LOW (ref 83.0–108.0)

## 2017-01-30 LAB — COMPREHENSIVE METABOLIC PANEL
ALBUMIN: 2.7 g/dL — AB (ref 3.5–5.0)
ALT: 33 U/L (ref 14–54)
ANION GAP: 7 (ref 5–15)
AST: 23 U/L (ref 15–41)
Alkaline Phosphatase: 48 U/L (ref 38–126)
BILIRUBIN TOTAL: 0.9 mg/dL (ref 0.3–1.2)
BUN: 49 mg/dL — AB (ref 6–20)
CHLORIDE: 82 mmol/L — AB (ref 101–111)
CO2: 43 mmol/L — ABNORMAL HIGH (ref 22–32)
Calcium: 8.6 mg/dL — ABNORMAL LOW (ref 8.9–10.3)
Creatinine, Ser: 1.28 mg/dL — ABNORMAL HIGH (ref 0.44–1.00)
GFR calc Af Amer: 47 mL/min — ABNORMAL LOW (ref >60)
GFR, EST NON AFRICAN AMERICAN: 41 mL/min — AB (ref >60)
GLUCOSE: 78 mg/dL (ref 65–99)
Potassium: 5.2 mmol/L — ABNORMAL HIGH (ref 3.5–5.1)
Sodium: 132 mmol/L — ABNORMAL LOW (ref 135–145)
TOTAL PROTEIN: 4.9 g/dL — AB (ref 6.5–8.1)

## 2017-01-30 LAB — CBC
HEMATOCRIT: 35.6 % — AB (ref 36.0–46.0)
HEMOGLOBIN: 10.4 g/dL — AB (ref 12.0–15.0)
MCH: 28.7 pg (ref 26.0–34.0)
MCHC: 29.2 g/dL — ABNORMAL LOW (ref 30.0–36.0)
MCV: 98.1 fL (ref 78.0–100.0)
Platelets: DECREASED 10*3/uL (ref 150–400)
RBC: 3.63 MIL/uL — AB (ref 3.87–5.11)
RDW: 15 % (ref 11.5–15.5)
WBC: 11.7 10*3/uL — ABNORMAL HIGH (ref 4.0–10.5)

## 2017-01-30 LAB — GLUCOSE, CAPILLARY: GLUCOSE-CAPILLARY: 81 mg/dL (ref 65–99)

## 2017-01-30 MED ORDER — METHYLPREDNISOLONE SODIUM SUCC 125 MG IJ SOLR
60.0000 mg | Freq: Two times a day (BID) | INTRAMUSCULAR | Status: DC
  Administered 2017-01-30 – 2017-02-01 (×3): 60 mg via INTRAVENOUS
  Filled 2017-01-30 (×3): qty 2

## 2017-01-30 MED ORDER — SODIUM CHLORIDE 0.9 % IV SOLN
INTRAVENOUS | Status: DC
  Administered 2017-01-30 (×2): via INTRAVENOUS

## 2017-01-30 MED ORDER — SODIUM CHLORIDE 0.9 % IV BOLUS (SEPSIS)
500.0000 mL | Freq: Once | INTRAVENOUS | Status: AC
  Administered 2017-01-30: 500 mL via INTRAVENOUS

## 2017-01-30 MED ORDER — SODIUM CHLORIDE 0.9 % IV BOLUS (SEPSIS)
1000.0000 mL | Freq: Once | INTRAVENOUS | Status: AC
  Administered 2017-01-30: 1000 mL via INTRAVENOUS

## 2017-01-30 MED ORDER — KETOROLAC TROMETHAMINE 15 MG/ML IJ SOLN
15.0000 mg | Freq: Once | INTRAMUSCULAR | Status: AC
  Administered 2017-01-30: 15 mg via INTRAVENOUS
  Filled 2017-01-30: qty 1

## 2017-01-30 MED ORDER — FLUMAZENIL 0.5 MG/5ML IV SOLN
0.2000 mg | Freq: Once | INTRAVENOUS | Status: DC
  Filled 2017-01-30: qty 5

## 2017-01-30 NOTE — Progress Notes (Signed)
Text to Dr Sharon Seller now starting 1000 ml bolus rt to audible wheezing.  However, since 0815 SBP in 70's.

## 2017-01-30 NOTE — Progress Notes (Addendum)
2 Attempts to insert Foley Cath with no urine return. Patient had large bowel movement while attempting to insert foley cath. Text Dr Bruna Potter.

## 2017-01-30 NOTE — Plan of Care (Signed)
Patient agreed to go on bipap tonight. Patient requested Ativan to help relax the patient while on bipap. Will continue to monitor and assess patient.

## 2017-01-30 NOTE — Progress Notes (Signed)
Text message to Dr Sharon Seller with ABG.

## 2017-01-30 NOTE — Progress Notes (Signed)
Patient back on BiPAP due to ABG results

## 2017-01-30 NOTE — Progress Notes (Signed)
Patient removed from bipap. Pt reported some chest pain. States it is 8 on the pain scale. LBP 75/51 (61). EKG performed and displayed a normal sinus rhythm. Patient give 50 mg tramadol oral for pain. Linton Flemings paged an notified Will continue to monitor and assess and pass on information to day shift nurse.

## 2017-01-30 NOTE — Progress Notes (Addendum)
[place] TEAM 1 - Stepdown/ICU TEAM  Sheila Pierce  PYK:[id number] DOB: October 01, 1945 DOA: 01/19/2017 PCP: [name], MD    Brief Narrative:  72 y.o.F Hx  Anxiety, Depression, NSTEMI, Chronic Diastolic CHF, HTN, QT prolongation, HLD, COPD, Chronic resp failure on home O2 3 L via [place], Tobacco abuse, CKD, DM, Anemia, and Trigeminal neuralgia who presented to the ED w/ dyspnea, unable to speak more than 1-2 words without gasping for air.   In the ED her ABG revealed pH 7.26, pCO288,pO2102. Patient was treated with continuous albuterol neb, 2 g IV magnesium, 125 mg IV Solu-Medrol, and started on BiPAP.   Significant Events: 12/26 admit  Subjective: RN reported hypotension w/ SBP 70s this morning.  On arrival to unit pt found to be quite lethargic but arousable to voice.  She quickly falls back to sleep after being aroused.  BP improved to 85 systolic when alert/stimulated.  She denies any new complaints to me, specifically denying chest pain, n/v, or abdom pain.  It appears she did wear BIPAP last night, but required a 0.5mg  ativan dose at midnight to allow her to tolerate it.    Assessment & Plan:  Hypotension - Somnolence Bolus w/ total of 1.5L NS - check stat abg and cbc - CXR ordered - dose w/ romazicon as this may be benzo sedation - no pertinent findings on exam - no gross evidence of signif blood loss but will keep RPH in differential   Sepsis due to Influenza A Completed course of Tamiflu - clinically improved - awaiting bed in a SNF facility for rehab   Acute hypercarbic resp failure on chronic hypoxic resp failure - acute COPD exacerbation cont to wean steroids - increase activity - continue nightly CPAP/BIPAP support when she will allow   Chronic grade 1 diastolic CHF EF 65-70% via TTE Nov 2018 - no significant volume overload on exam - cont medical regimen   Filed Weights   01/28/17 0410 01/29/17 0454 01/30/17 0349  Weight: 62.1 kg (136 lb 14.5 oz) 62.1 kg (136  lb 14.5 oz) 62.6 kg (138 lb 0.1 oz)   CAD Asymptomatic  Anxiety  Well controlled at this time  DVT prophylaxis: lovenox  Code Status: FULL CODE (pt herself changed status on 01/25/2017) Family Communication: no family present at time of exam  Disposition Plan: awaiting insurance authorization for SNF - lives in [place] so prefers SNF in [place] or [place] - not stable for D/C presently w/ acute issues noted above   Consultants:  none  Antimicrobials:  none  Objective: Blood pressure (!) 78/36, pulse 82, temperature 98.1 F (36.7 C), temperature source Oral, resp. rate 13, height 5' (1.524 m), weight 62.6 kg (138 lb 0.1 oz), SpO2 100 %.  Intake/Output Summary (Last 24 hours) at 01/30/2017 0906 Last data filed at 01/29/2017 2329 Gross per 24 hour  Intake 1360 ml  Output 500 ml  Net 860 ml   Filed Weights   01/28/17 0410 01/29/17 0454 01/30/17 0349  Weight: 62.1 kg (136 lb 14.5 oz) 62.1 kg (136 lb 14.5 oz) 62.6 kg (138 lb 0.1 oz)    Examination: General: very somnolent - appears sedated - wakens to voice  Lungs: poor air movement in all fields - no active wheezing - no focal crackles  Cardiovascular: distant HS - RRR - no M or rub  Extremities: No signif edema B LE   CBC: Recent Labs  Lab 01/24/17 5397 01/25/17 0429 01/26/17 6734 01/27/17 1937 01/28/17 0518  WBC 8.7 8.5 9.5 9.6 11.9*  HGB 11.3* 10.6* 10.8* 11.1* 10.3*  HCT 36.3 34.9* 35.7* 35.0* 35.2*  MCV 95.0 95.1 94.4 93.1 95.7  PLT 163 173 155 154 169   Basic Metabolic Panel: Recent Labs  Lab 01/24/17 0412 01/25/17 0429 01/26/17 0841 01/27/17 0619 01/28/17 0518  NA 138 134* 136 134* 135  K 4.4 4.1 3.8 4.1 4.6  CL 95* 89* 84* 85* 86*  CO2 34* 39* 44* 43* 43*  GLUCOSE 127* 109* 112* 111* 149*  BUN 25* 25* 25* 28* 39*  CREATININE 0.63 0.62 0.70 0.65 0.73  CALCIUM 8.6* 8.9 9.1 8.8* 9.0  MG 1.9  --  1.9 2.1 2.1   GFR: Estimated Creatinine Clearance: 52.5 mL/min (by C-G formula based on SCr of 0.73  mg/dL).  HbA1C: Hgb A1c MFr Bld  Date/Time Value Ref Range Status  12/17/2016 05:53 AM 5.5 4.8 - 5.6 % Final    Comment:    (NOTE)         Prediabetes: 5.7 - 6.4         Diabetes: >6.4         Glycemic control for adults with diabetes: <7.0   11/11/2016 09:44 AM 5.2 4.8 - 5.6 % Final    Comment:             Prediabetes: 5.7 - 6.4          Diabetes: >6.4          Glycemic control for adults with diabetes: <7.0     CBG: Recent Labs  Lab 01/26/17 0805 01/27/17 0715 01/28/17 0724 01/29/17 0828 01/30/17 0818  GLUCAP 101* 105* 108* 88 81    Recent Results (from the past 240 hour(s))  Respiratory Panel by PCR     Status: Abnormal   Collection Time: 01/20/17  9:31 AM  Result Value Ref Range Status   Adenovirus NOT DETECTED NOT DETECTED Final   Coronavirus 229E NOT DETECTED NOT DETECTED Final   Coronavirus HKU1 NOT DETECTED NOT DETECTED Final   Coronavirus NL63 NOT DETECTED NOT DETECTED Final   Coronavirus OC43 NOT DETECTED NOT DETECTED Final   Metapneumovirus NOT DETECTED NOT DETECTED Final   Rhinovirus / Enterovirus NOT DETECTED NOT DETECTED Final   Influenza A H1 2009 DETECTED (A) NOT DETECTED Final   Influenza B NOT DETECTED NOT DETECTED Final   Parainfluenza Virus 1 NOT DETECTED NOT DETECTED Final   Parainfluenza Virus 2 NOT DETECTED NOT DETECTED Final   Parainfluenza Virus 3 NOT DETECTED NOT DETECTED Final   Parainfluenza Virus 4 NOT DETECTED NOT DETECTED Final   Respiratory Syncytial Virus NOT DETECTED NOT DETECTED Final   Bordetella pertussis NOT DETECTED NOT DETECTED Final   Chlamydophila pneumoniae NOT DETECTED NOT DETECTED Final   Mycoplasma pneumoniae NOT DETECTED NOT DETECTED Final  Culture, expectorated sputum-assessment     Status: None   Collection Time: 01/20/17  4:21 PM  Result Value Ref Range Status   Specimen Description EXPECTORATED SPUTUM  Final   Special Requests NONE  Final   Sputum evaluation THIS SPECIMEN IS ACCEPTABLE FOR SPUTUM CULTURE   Final   Report Status 01/20/2017 FINAL  Final  Culture, respiratory (NON-Expectorated)     Status: None   Collection Time: 01/20/17  4:21 PM  Result Value Ref Range Status   Specimen Description EXPECTORATED SPUTUM  Final   Special Requests NONE Reflexed from [id number]  Final   Gram Stain   Final    ABUNDANT WBC PRESENT, PREDOMINANTLY PMN RARE  GRAM POSITIVE COCCI IN CHAINS RARE GRAM NEGATIVE COCCI IN PAIRS NO SQUAMOUS EPITHELIAL CELLS SEEN    Culture Consistent with normal respiratory flora.  Final   Report Status 01/23/2017 FINAL  Final     Scheduled Meds: . aspirin EC  81 mg Oral Daily  . budesonide (PULMICORT) nebulizer solution  0.5 mg Nebulization BID  . busPIRone  15 mg Oral BID  . chlorhexidine  15 mL Mouth Rinse BID  . cholecalciferol  1,000 Units Oral Daily  . enoxaparin (LOVENOX) injection  40 mg Subcutaneous Q24H  . flumazenil  0.2 mg Intravenous Once  . furosemide  20 mg Oral Daily  . ipratropium-albuterol  3 mL Nebulization TID  . lisinopril  10 mg Oral Daily  . mouth rinse  15 mL Mouth Rinse q12n4p  . metoprolol tartrate  50 mg Oral BID  . multivitamin with minerals  1 tablet Oral Daily  . potassium chloride  10 mEq Oral Daily  . predniSONE  40 mg Oral Q breakfast  . sodium chloride flush  3 mL Intravenous Q12H     LOS: 11 days   Lonia Blood, MD [facility] Office  [phone] Pager - Text Page per Amion as per below:  On-Call/Text Page:      [email or web address]      password [email or web address]  If 7PM-7AM, please contact night-coverage [email or web address] Password [email or web address] 01/30/2017, 9:06 AM

## 2017-01-30 NOTE — Progress Notes (Addendum)
Patient tolerated BiPAP for approx 6hrs and rested well.  Patient stated that she will be able to rest well on on BiPAP tonight. Dr Bruna Potter aware that patient unable to have foley cath.

## 2017-01-31 LAB — BLOOD GAS, ARTERIAL
ACID-BASE EXCESS: 17.7 mmol/L — AB (ref 0.0–2.0)
BICARBONATE: 44.1 mmol/L — AB (ref 20.0–28.0)
DELIVERY SYSTEMS: POSITIVE
DRAWN BY: 246861
Expiratory PAP: 8
FIO2: 40
Inspiratory PAP: 20
O2 SAT: 96.7 %
PATIENT TEMPERATURE: 98.6
pCO2 arterial: 80.1 mmHg (ref 32.0–48.0)
pH, Arterial: 7.36 (ref 7.350–7.450)
pO2, Arterial: 85.6 mmHg (ref 83.0–108.0)

## 2017-01-31 LAB — CBC
HEMATOCRIT: 32.2 % — AB (ref 36.0–46.0)
Hemoglobin: 9.2 g/dL — ABNORMAL LOW (ref 12.0–15.0)
MCH: 27.9 pg (ref 26.0–34.0)
MCHC: 28.6 g/dL — ABNORMAL LOW (ref 30.0–36.0)
MCV: 97.6 fL (ref 78.0–100.0)
Platelets: 167 10*3/uL (ref 150–400)
RBC: 3.3 MIL/uL — ABNORMAL LOW (ref 3.87–5.11)
RDW: 15.2 % (ref 11.5–15.5)
WBC: 13.1 10*3/uL — AB (ref 4.0–10.5)

## 2017-01-31 LAB — BASIC METABOLIC PANEL
ANION GAP: 7 (ref 5–15)
BUN: 37 mg/dL — ABNORMAL HIGH (ref 6–20)
CO2: 37 mmol/L — AB (ref 22–32)
Calcium: 8.6 mg/dL — ABNORMAL LOW (ref 8.9–10.3)
Chloride: 91 mmol/L — ABNORMAL LOW (ref 101–111)
Creatinine, Ser: 1 mg/dL (ref 0.44–1.00)
GFR, EST NON AFRICAN AMERICAN: 55 mL/min — AB (ref >60)
Glucose, Bld: 173 mg/dL — ABNORMAL HIGH (ref 65–99)
Potassium: 5.5 mmol/L — ABNORMAL HIGH (ref 3.5–5.1)
SODIUM: 135 mmol/L (ref 135–145)

## 2017-01-31 LAB — GLUCOSE, CAPILLARY: GLUCOSE-CAPILLARY: 164 mg/dL — AB (ref 65–99)

## 2017-01-31 MED ORDER — SODIUM CHLORIDE 0.9 % IV BOLUS (SEPSIS)
250.0000 mL | Freq: Once | INTRAVENOUS | Status: AC
  Administered 2017-01-31: 250 mL via INTRAVENOUS

## 2017-01-31 MED ORDER — METOPROLOL TARTRATE 12.5 MG HALF TABLET
12.5000 mg | ORAL_TABLET | Freq: Two times a day (BID) | ORAL | Status: DC
  Administered 2017-01-31 – 2017-02-01 (×2): 12.5 mg via ORAL
  Filled 2017-01-31 (×4): qty 1

## 2017-01-31 NOTE — Progress Notes (Signed)
Physical Therapy Treatment Patient Details Name: Sheila Pierce MRN: [id number] DOB: 03/04/1945 Today's Date: 01/31/2017    History of Present Illness Pt is a 72 y/o female with medical history significant for chronic diastolic CHF, anxiety, and COPD with chronic hypercarbic and hypoxic respiratory failure, now presenting to the emergency department for increased dyspnea secondary to a COPD exacerbation    PT Comments    Pt admitted with above diagnosis. Pt currently with functional limitations due to balance and endurance deficits. Pt refused OOB.  Pt was up earlier and got back to bed an hour ago.  Did talk pt into exercising with PT.  Pt did more UE than LE exercises.  Pt moves her left UE more than her right UE. Will continue PT and progress as pt tolerates.   Pt will benefit from skilled PT to increase their independence and safety with mobility to allow discharge to the venue listed below.     Follow Up Recommendations  SNF;Supervision/Assistance - 24 hour     Equipment Recommendations  None recommended by PT    Recommendations for Other Services       Precautions / Restrictions Precautions Precautions: Fall Precaution Comments: watch SPO2, DROPLET Restrictions Weight Bearing Restrictions: No    Mobility  Bed Mobility Overal bed mobility: Needs Assistance Bed Mobility: Supine to Sit     Supine to sit: Min guard     General bed mobility comments: min guard supine to long sit.    Transfers                    Ambulation/Gait                 Stairs            Wheelchair Mobility    Modified Rankin (Stroke Patients Only)       Balance                                            Cognition Arousal/Alertness: Awake/alert Behavior During Therapy: Anxious;Flat affect Overall Cognitive Status: Impaired/Different from baseline Area of Impairment: Safety/judgement;Problem solving                          Safety/Judgement: Decreased awareness of safety;Decreased awareness of deficits   Problem Solving: Slow processing;Decreased initiation;Difficulty sequencing;Requires verbal cues;Requires tactile cues General Comments: cognition not formally assessed but [facility] for general conversation      Exercises General Exercises - Upper Extremity Shoulder Flexion: AROM;Both;5 reps;Seated Shoulder Extension: AROM;Both;5 reps;Seated Shoulder Horizontal ADduction: AROM;Both;5 reps;Seated Elbow Flexion: AROM;Both;5 reps;Seated Elbow Extension: AROM;Both;5 reps;Supine General Exercises - Lower Extremity Ankle Circles/Pumps: AROM;Both;5 reps;Seated Heel Slides: AROM;Both;5 reps;Supine Hip ABduction/ADduction: AROM;Both;5 reps;Supine    General Comments        Pertinent Vitals/Pain Pain Assessment: No/denies pain    Home Living                      Prior Function            PT Goals (current goals can now be found in the care plan section) Acute Rehab PT Goals Patient Stated Goal: to feel better Progress towards PT goals: Not progressing toward goals - comment(self limiting)    Frequency    Min 2X/week      PT Plan Current plan remains appropriate  Co-evaluation              AM-PAC PT "6 Clicks" Daily Activity  Outcome Measure  Difficulty turning over in bed (including adjusting bedclothes, sheets and blankets)?: Unable Difficulty moving from lying on back to sitting on the side of the bed? : Unable Difficulty sitting down on and standing up from a chair with arms (e.g., wheelchair, bedside commode, etc,.)?: Unable Help needed moving to and from a bed to chair (including a wheelchair)?: A Lot Help needed walking in hospital room?: Total Help needed climbing 3-5 steps with a railing? : Total 6 Click Score: 7    End of Session Equipment Utilized During Treatment: Oxygen;Gait belt Activity Tolerance: Patient limited by fatigue Patient left: in bed;with call  bell/phone within reach Nurse Communication: Mobility status PT Visit Diagnosis: Other abnormalities of gait and mobility (R26.89)     Time: 6063-0160 PT Time Calculation (min) (ACUTE ONLY): 18 min  Charges:  $Therapeutic Exercise: 8-22 mins                    G Codes:       Reagan Behlke,PT Acute Rehabilitation [phone] (pager)    Berline Lopes 01/31/2017, 3:02 PM

## 2017-01-31 NOTE — Progress Notes (Signed)
[place] TEAM 1 - Stepdown/ICU TEAM  Sheila Pierce  CZY:[id number] DOB: [birth year] DOA: 01/19/2017 PCP: [name], MD    Brief Narrative:  72 y.o.F Hx  Anxiety, Depression, NSTEMI, Chronic Diastolic CHF, HTN, QT prolongation, HLD, COPD, Chronic resp failure on home O2 3 L via [place], Tobacco abuse, CKD, DM, Anemia, and Trigeminal neuralgia who presented to the ED w/ dyspnea, unable to speak more than 1-2 words without gasping for air.   In the ED her ABG revealed pH 7.26, pCO288,pO2102. Patient was treated with continuous albuterol neb, 2 g IV magnesium, 125 mg IV Solu-Medrol, and started on BiPAP.   Significant Events: 12/26 admit  Subjective: The patient's hypotension has finally resolved with ongoing volume resuscitation.  Today however she is tachycardic and having episodes of intermittent hypoxia.  She is alert and conversant but reports she does not remember much about yesterday.  She denies chest pain nausea vomiting or abdominal pain but does feel short of breath.  Assessment & Plan:  Hypotension  Resolved w/ volume expansion - slow IVF and monitor trend - cautiously resume BB in setting of sinus tachycardia  Sinus tachycardia  Likely at this point due to beta blocker withdrawal as well as some mild respiratory distress - cautiously resume her usual beta blocker and follow  Sepsis due to Influenza A Completed course of Tamiflu  Acute hypercarbic resp failure on chronic hypoxic resp failure - acute COPD exacerbation Steroids increased again 1/6 due to acute resp decline - cont at same dose today w/o change - increase activity - continue nightly CPAP/BIPAP support when she will allow   Chronic grade 1 diastolic CHF EF 65-70% via TTE Nov 2018 - trace B LE edema today after volume expansion yesterday   Filed Weights   01/28/17 0410 01/29/17 0454 01/30/17 0349  Weight: 62.1 kg (136 lb 14.5 oz) 62.1 kg (136 lb 14.5 oz) 62.6 kg (138 lb 0.1 oz)    CAD Asymptomatic  Anxiety  Well controlled at this time  DVT prophylaxis: lovenox  Code Status: FULL CODE (pt herself changed status on 01/25/2017) Family Communication: no family present at time of exam  Disposition Plan: awaiting insurance authorization for SNF, as well as medical stabilty - lives in [place] so prefers SNF in [place] or [place] - not stable for D/C presently w/ acute issues noted above   Consultants:  none  Antimicrobials:  none  Objective: Blood pressure (!) 107/59, pulse 98, temperature 99 F (37.2 C), temperature source Oral, resp. rate 18, height 5' (1.524 m), weight 62.6 kg (138 lb 0.1 oz), SpO2 100 %.  Intake/Output Summary (Last 24 hours) at 01/31/2017 1607 Last data filed at 01/31/2017 0500 Gross per 24 hour  Intake 2490.83 ml  Output 300 ml  Net 2190.83 ml   Filed Weights   01/28/17 0410 01/29/17 0454 01/30/17 0349  Weight: 62.1 kg (136 lb 14.5 oz) 62.1 kg (136 lb 14.5 oz) 62.6 kg (138 lb 0.1 oz)    Examination: General: Alert and conversant today - back to her baseline Lungs: poor air movement in all fields - no active wheezing at time of my exam Cardiovascular: distant HS - tachycardic but regular - no murmur or rub Extremities: Trace bilateral lower extremity edema  CBC: Recent Labs  Lab 01/26/17 0841 01/27/17 0619 01/28/17 0518 01/30/17 1019 01/31/17 0407  WBC 9.5 9.6 11.9* 11.7* 13.1*  HGB 10.8* 11.1* 10.3* 10.4* 9.2*  HCT 35.7* 35.0* 35.2* 35.6* 32.2*  MCV 94.4 93.1 95.7  98.1 97.6  PLT 155 154 169 PLATELET CLUMPS NOTED ON SMEAR, COUNT APPEARS DECREASED 167   Basic Metabolic Panel: Recent Labs  Lab 01/26/17 0841 01/27/17 0619 01/28/17 0518 01/30/17 0849 01/31/17 0407  NA 136 134* 135 132* 135  K 3.8 4.1 4.6 5.2* 5.5*  CL 84* 85* 86* 82* 91*  CO2 44* 43* 43* 43* 37*  GLUCOSE 112* 111* 149* 78 173*  BUN 25* 28* 39* 49* 37*  CREATININE 0.70 0.65 0.73 1.28* 1.00  CALCIUM 9.1 8.8* 9.0 8.6* 8.6*  MG 1.9 2.1 2.1  --   --     GFR: Estimated Creatinine Clearance: 42 mL/min (by C-G formula based on SCr of 1 mg/dL).  HbA1C: Hgb A1c MFr Bld  Date/Time Value Ref Range Status  12/17/2016 05:53 AM 5.5 4.8 - 5.6 % Final    Comment:    (NOTE)         Prediabetes: 5.7 - 6.4         Diabetes: >6.4         Glycemic control for adults with diabetes: <7.0   11/11/2016 09:44 AM 5.2 4.8 - 5.6 % Final    Comment:             Prediabetes: 5.7 - 6.4          Diabetes: >6.4          Glycemic control for adults with diabetes: <7.0     CBG: Recent Labs  Lab 01/27/17 0715 01/28/17 0724 01/29/17 0828 01/30/17 0818 01/31/17 0814  GLUCAP 105* 108* 88 81 164*    Scheduled Meds: . aspirin EC  81 mg Oral Daily  . budesonide (PULMICORT) nebulizer solution  0.5 mg Nebulization BID  . busPIRone  15 mg Oral BID  . chlorhexidine  15 mL Mouth Rinse BID  . cholecalciferol  1,000 Units Oral Daily  . enoxaparin (LOVENOX) injection  40 mg Subcutaneous Q24H  . ipratropium-albuterol  3 mL Nebulization TID  . mouth rinse  15 mL Mouth Rinse q12n4p  . methylPREDNISolone (SOLU-MEDROL) injection  60 mg Intravenous Q12H  . multivitamin with minerals  1 tablet Oral Daily  . sodium chloride flush  3 mL Intravenous Q12H     LOS: 12 days   Lonia Blood, MD [facility] Office  [phone] Pager - Text Page per Amion as per below:  On-Call/Text Page:      [email or web address]      password [email or web address]  If 7PM-7AM, please contact night-coverage [email or web address] Password [email or web address] 01/31/2017, 4:07 PM

## 2017-01-31 NOTE — Plan of Care (Signed)
Patient was educated about the need to use the bipap while she sleeps. Patient was placed on bipap and successfully wore it majority of the night.

## 2017-02-01 ENCOUNTER — Inpatient Hospital Stay (HOSPITAL_COMMUNITY): Payer: Medicare HMO

## 2017-02-01 DIAGNOSIS — R0602 Shortness of breath: Secondary | ICD-10-CM

## 2017-02-01 LAB — BASIC METABOLIC PANEL
Anion gap: 7 (ref 5–15)
BUN: 34 mg/dL — AB (ref 6–20)
CALCIUM: 9.7 mg/dL (ref 8.9–10.3)
CO2: 41 mmol/L — ABNORMAL HIGH (ref 22–32)
CREATININE: 0.76 mg/dL (ref 0.44–1.00)
Chloride: 90 mmol/L — ABNORMAL LOW (ref 101–111)
GFR calc Af Amer: >60 mL/min (ref >60)
GLUCOSE: 177 mg/dL — AB (ref 65–99)
Potassium: 4.8 mmol/L (ref 3.5–5.1)
SODIUM: 138 mmol/L (ref 135–145)

## 2017-02-01 LAB — GLUCOSE, CAPILLARY: GLUCOSE-CAPILLARY: 110 mg/dL — AB (ref 65–99)

## 2017-02-01 MED ORDER — LEVALBUTEROL HCL 0.63 MG/3ML IN NEBU
0.6300 mg | INHALATION_SOLUTION | Freq: Three times a day (TID) | RESPIRATORY_TRACT | Status: DC
  Administered 2017-02-01: 0.63 mg via RESPIRATORY_TRACT
  Filled 2017-02-01: qty 3

## 2017-02-01 MED ORDER — METHYLPREDNISOLONE SODIUM SUCC 125 MG IJ SOLR
60.0000 mg | Freq: Two times a day (BID) | INTRAMUSCULAR | Status: AC
  Administered 2017-02-01: 60 mg via INTRAVENOUS
  Filled 2017-02-01: qty 2

## 2017-02-01 MED ORDER — METOPROLOL TARTRATE 25 MG PO TABS
25.0000 mg | ORAL_TABLET | Freq: Two times a day (BID) | ORAL | Status: DC
  Administered 2017-02-01 – 2017-02-03 (×4): 25 mg via ORAL
  Filled 2017-02-01 (×4): qty 1

## 2017-02-01 MED ORDER — IPRATROPIUM BROMIDE 0.02 % IN SOLN
0.5000 mg | Freq: Three times a day (TID) | RESPIRATORY_TRACT | Status: DC
  Administered 2017-02-01: 0.5 mg via RESPIRATORY_TRACT
  Filled 2017-02-01: qty 2.5

## 2017-02-01 MED ORDER — LEVALBUTEROL HCL 0.63 MG/3ML IN NEBU
0.6300 mg | INHALATION_SOLUTION | RESPIRATORY_TRACT | Status: DC | PRN
Start: 2017-02-01 — End: 2017-02-05

## 2017-02-01 MED ORDER — PREDNISONE 20 MG PO TABS
60.0000 mg | ORAL_TABLET | Freq: Every day | ORAL | Status: DC
  Administered 2017-02-02 – 2017-02-05 (×4): 60 mg via ORAL
  Filled 2017-02-01 (×7): qty 3

## 2017-02-01 NOTE — Progress Notes (Signed)
Pt refusing to stay in chair any longer. Transferred back to bed. Educated that she will have to get back to the chair for lunch per MD order. Pt states understanding

## 2017-02-01 NOTE — Progress Notes (Signed)
Spoke w/ RN coordinator for SNF today who states pt can not transfer to facility while on droplet precautions. Pt has completed Tamiflu. Left vm for Infection Control regarding policy/procedures for taking pt off droplet precautions. Requested call back and provided number

## 2017-02-01 NOTE — Progress Notes (Signed)
Received call from woman who was able to provide pts password and states she was pts [facility] nurse.  States she wants to speak w/ SW and provided name and number. Left vm for SW to relay

## 2017-02-01 NOTE — Progress Notes (Signed)
[place] TEAM 1 - Stepdown/ICU TEAM  Sheila Pierce  MMN:[id number] DOB: 08-Jul-1945 DOA: 01/19/2017 PCP: [name], MD    Brief Narrative:  72 y.o.F Hx  Anxiety, Depression, NSTEMI, Chronic Diastolic CHF, HTN, QT prolongation, HLD, COPD, Chronic resp failure on home O2 3 L via [place], Tobacco abuse, CKD, DM, Anemia, and Trigeminal neuralgia who presented to the ED w/ dyspnea, unable to speak more than 1-2 words without gasping for air.   In the ED her ABG revealed pH 7.26, pCO288,pO2102. Patient was treated with continuous albuterol neb, 2 g IV magnesium, 125 mg IV Solu-Medrol, and started on BiPAP.   Significant Events: 12/26 admit  Subjective: The patient is dramatically improved over the last 12 hours.  Her tachycardia is much improved with beta-blocker resumption, and is expected to completely resolve with titration back to her baseline beta-blocker dose.  She is alert conversant and saturating well on minimal oxygen support.  She denies chest pain nausea or vomiting.  She is sitting up eating lunch and reports a good appetite.  Assessment & Plan:  Hypotension  Resolved w/ volume expansion - bp now climbing - resuming usual home BP meds   Sinus tachycardia  due to beta blocker withdrawal - improving w/ resumption of BB - increase back to her full home dose and expect this will fully correct her tachycardia   Sepsis due to Influenza A Completed course of Tamiflu - no need to cont resp precautions   Acute hypercarbic resp failure on chronic hypoxic resp failure - acute COPD exacerbation Steroids increased again 1/6 due to acute resp decline - begin to wean again w/ signif improvement over last 24hrs - increase activity - continue nightly CPAP/BIPAP support when she will allow   Chronic grade 1 diastolic CHF EF 65-70% via TTE Nov 2018 - trace B LE edema w/o change    Filed Weights   01/29/17 0454 01/30/17 0349 02/01/17 0500  Weight: 62.1 kg (136 lb 14.5 oz) 62.6 kg  (138 lb 0.1 oz) 63.7 kg (140 lb 6.9 oz)   CAD Asymptomatic  Anxiety  Well controlled at this time  DVT prophylaxis: lovenox  Code Status: FULL CODE (pt herself changed status on 01/25/2017) Family Communication: no family present at time of exam  Disposition Plan: awaiting insurance authorization for SNF - is now medically stable for d/c - lives in [place] so prefers SNF in [place] or [place] - not stable for D/C presently w/ acute issues noted above   Consultants:  none  Antimicrobials:  none  Objective: Blood pressure 131/61, pulse (!) 106, temperature 98.3 F (36.8 C), temperature source Oral, resp. rate 12, height 5' (1.524 m), weight 63.7 kg (140 lb 6.9 oz), SpO2 100 %.  Intake/Output Summary (Last 24 hours) at 02/01/2017 1541 Last data filed at 02/01/2017 1344 Gross per 24 hour  Intake 959 ml  Output 900 ml  Net 59 ml   Filed Weights   01/29/17 0454 01/30/17 0349 02/01/17 0500  Weight: 62.1 kg (136 lb 14.5 oz) 62.6 kg (138 lb 0.1 oz) 63.7 kg (140 lb 6.9 oz)    Examination: General: Alert and conversant - no acute distress Lungs: poor air movement in all fields as per baseline - no wheezing  Cardiovascular: distant HS - mildly tachycardic but regular  Extremities: trace B LE edema   CBC: Recent Labs  Lab 01/26/17 0841 01/27/17 0619 01/28/17 0518 01/30/17 1019 01/31/17 0407  WBC 9.5 9.6 11.9* 11.7* 13.1*  HGB 10.8* 11.1*  10.3* 10.4* 9.2*  HCT 35.7* 35.0* 35.2* 35.6* 32.2*  MCV 94.4 93.1 95.7 98.1 97.6  PLT 155 154 169 PLATELET CLUMPS NOTED ON SMEAR, COUNT APPEARS DECREASED 167   Basic Metabolic Panel: Recent Labs  Lab 01/26/17 0841 01/27/17 0619 01/28/17 0518 01/30/17 0849 01/31/17 0407 02/01/17 0542  NA 136 134* 135 132* 135 138  K 3.8 4.1 4.6 5.2* 5.5* 4.8  CL 84* 85* 86* 82* 91* 90*  CO2 44* 43* 43* 43* 37* 41*  GLUCOSE 112* 111* 149* 78 173* 177*  BUN 25* 28* 39* 49* 37* 34*  CREATININE 0.70 0.65 0.73 1.28* 1.00 0.76  CALCIUM 9.1 8.8* 9.0 8.6*  8.6* 9.7  MG 1.9 2.1 2.1  --   --   --    GFR: Estimated Creatinine Clearance: 53 mL/min (by C-G formula based on SCr of 0.76 mg/dL).  HbA1C: Hgb A1c MFr Bld  Date/Time Value Ref Range Status  12/17/2016 05:53 AM 5.5 4.8 - 5.6 % Final    Comment:    (NOTE)         Prediabetes: 5.7 - 6.4         Diabetes: >6.4         Glycemic control for adults with diabetes: <7.0   11/11/2016 09:44 AM 5.2 4.8 - 5.6 % Final    Comment:             Prediabetes: 5.7 - 6.4          Diabetes: >6.4          Glycemic control for adults with diabetes: <7.0     CBG: Recent Labs  Lab 01/28/17 0724 01/29/17 0828 01/30/17 0818 01/31/17 0814 02/01/17 0802  GLUCAP 108* 88 81 164* 110*    Scheduled Meds: . aspirin EC  81 mg Oral Daily  . budesonide (PULMICORT) nebulizer solution  0.5 mg Nebulization BID  . busPIRone  15 mg Oral BID  . chlorhexidine  15 mL Mouth Rinse BID  . cholecalciferol  1,000 Units Oral Daily  . enoxaparin (LOVENOX) injection  40 mg Subcutaneous Q24H  . ipratropium-albuterol  3 mL Nebulization TID  . mouth rinse  15 mL Mouth Rinse q12n4p  . methylPREDNISolone (SOLU-MEDROL) injection  60 mg Intravenous Q12H  . metoprolol tartrate  12.5 mg Oral BID  . multivitamin with minerals  1 tablet Oral Daily  . sodium chloride flush  3 mL Intravenous Q12H     LOS: 13 days   Lonia Blood, MD [facility] Office  [phone] Pager - Text Page per Amion as per below:  On-Call/Text Page:      [email or web address]      password [email or web address]  If 7PM-7AM, please contact night-coverage [email or web address] Password [facility] 02/01/2017, 3:41 PM

## 2017-02-01 NOTE — Progress Notes (Signed)
Plan for DC to SNF tomorrow- facility aware and will be ready at that time once insurance authorization received  Pt son called and updated on DC plan- remains agreeable  Burna Sis, LCSW Clinical Social Worker [phone]

## 2017-02-02 LAB — BASIC METABOLIC PANEL
Anion gap: 9 (ref 5–15)
BUN: 27 mg/dL — AB (ref 6–20)
CHLORIDE: 85 mmol/L — AB (ref 101–111)
CO2: 43 mmol/L — AB (ref 22–32)
Calcium: 9.7 mg/dL (ref 8.9–10.3)
Creatinine, Ser: 0.67 mg/dL (ref 0.44–1.00)
GFR calc Af Amer: >60 mL/min (ref >60)
GLUCOSE: 144 mg/dL — AB (ref 65–99)
POTASSIUM: 4.8 mmol/L (ref 3.5–5.1)
Sodium: 137 mmol/L (ref 135–145)

## 2017-02-02 LAB — MAGNESIUM: Magnesium: 1.9 mg/dL (ref 1.7–2.4)

## 2017-02-02 LAB — GLUCOSE, CAPILLARY: Glucose-Capillary: 115 mg/dL — ABNORMAL HIGH (ref 65–99)

## 2017-02-02 MED ORDER — FUROSEMIDE 20 MG PO TABS
20.0000 mg | ORAL_TABLET | Freq: Every day | ORAL | Status: DC
  Administered 2017-02-02 – 2017-02-03 (×2): 20 mg via ORAL
  Filled 2017-02-02 (×2): qty 1

## 2017-02-02 MED ORDER — IPRATROPIUM BROMIDE 0.02 % IN SOLN
0.5000 mg | Freq: Three times a day (TID) | RESPIRATORY_TRACT | Status: DC
  Administered 2017-02-02 – 2017-02-05 (×11): 0.5 mg via RESPIRATORY_TRACT
  Filled 2017-02-02 (×11): qty 2.5

## 2017-02-02 MED ORDER — LEVALBUTEROL HCL 0.63 MG/3ML IN NEBU
0.6300 mg | INHALATION_SOLUTION | Freq: Three times a day (TID) | RESPIRATORY_TRACT | Status: DC
  Administered 2017-02-02 – 2017-02-05 (×11): 0.63 mg via RESPIRATORY_TRACT
  Filled 2017-02-02 (×11): qty 3

## 2017-02-02 NOTE — Progress Notes (Signed)
CSW informed that [facility] has denied SNF stay due to patient being near baseline mobility (wheelchair bound at home).  CSW informed MD who plans to do peer to peer to appeal denial  Will need to call appeal line at [phone]  Reference #: [id number]  Will need to call before 12pm tomorrow in order to submit appeal  CSW will continue to follow  Burna Sis, LCSW Clinical Social Worker [phone]

## 2017-02-02 NOTE — Progress Notes (Signed)
PROGRESS NOTE    Sheila Pierce  ZOX:[id number] DOB: 1945/12/19 DOA: 01/19/2017 PCP: [name], MD   Brief Narrative:  72 y.o. WF PMHx   Anxiety, Depression, NSTEMI, Chronic Diastolic CHF, HTN, QT prolongation, HLD, COPD, Chronic resp failure on home O2 3 L via [place],Tobacco abuse, CKD, Diabetes mellitus without complication,  Anemia, Trigeminal neuralgia  Presenting to the emergency department for increased dyspnea.  Patient reports chronic shortness of breath, but notes that this has decreased markedly in recent days point where she is now unable to speak more than 1-2 words without gasping for air.  Denies fevers or chills and denies chest pain or palpitations.   ED Course: Upon arrival to the ED, patient is found to be afebrile, saturating adequately on 3 L/min of supplemental oxygen, tachypneic, tachycardic, and with stable blood pressure.  EKG features a sinus tachycardia with rate 110 and chest x-ray is negative for acute cardiopulmonary disease.  Chemistry panel reveals a slight hyponatremia carbonate of 35.  BUN to creatinine ratio is elevated.  CBC is unremarkable.  ABG reveals pH 7.26, pCO2 88, pO2 102.  Patient was treated with continuous albuterol neb, 2 g IV magnesium, 125 mg IV Solu-Medrol, and started on BiPAP.  She remains hemodynamically stable, remains markedly dyspneic, and will be admitted to the stepdown unit for ongoing evaluation and management of acute exacerbation in COPD.    Subjective: 1/9 A/O 4, positive chronic respiratory distress, negative CP, negative abdominal pain, negative N/V     Assessment & Plan:   Active Problems:   CAD (coronary artery disease)   Anxiety   COPD exacerbation (HCC)   Chronic diastolic CHF (congestive heart failure) (HCC)   Acute on chronic respiratory failure with hypercapnia (HCC)   COPD with acute exacerbation (HCC)   Sepsis unspecified organism? -Patient meets criteria on admission HR> 90, RR> 20. In addition patient has  lactic acidosis 2.3 -Completed course Lovenox and Tamiflu -Out of bed to chair for all meals  positive influenza A pneumonia -See sepsis  Acute on Chronic Respiratory Failure with Hypoxia and hypercarbia/COPD exacerbation -DuoNeb TID  -Pulmicort nebulizer - Discontinue Breo-Ellipta patient's poor respiratory status will not allow her to control and sufficient amount of medication  -Prednisone 60 mg daily -Mucinex DM -Flutter valve -BiPAP QHS and PRN sleeping (Pt cannot refuse treatment she decompensates). If patient refuses contact on-call physician  -RN instructions ENSURE patient wears BiPAP notify on-call physician when BiPAP discontinued.  Chronic Diastolic CHF (base weight?) -EKG from 01/19/2017 compared to EKG 01/01/2017 no significant change. Sinus tachycardia -Strict in and out since admission +3.8 L -Daily weight Filed Weights   01/30/17 0349 02/01/17 0500 02/02/17 0500  Weight: 138 lb 0.1 oz (62.6 kg) 140 lb 6.9 oz (63.7 kg) 141 lb 1.5 oz (64 kg)  -Lasix 20 mg daily -Hydralazine PRN -Metoprolol 25 mg BID  CAD  -Asymptomatic -ASA 81 mg daily -See CHF  Anxiety -BuSpar 50 mg daily -Ativan PRN     DVT prophylaxis: Lovenox Code Status: DNR Family Communication: None Disposition Plan: TBD   Consultants:  None   Procedures/Significant Events:  12/17/16 Echocardiogram: LVEF=:65% to 70%.-Grade 1 diastolic dysfunction). - Atrial septum: There was increased thickness of the septum, c/w lipomatous hypertrophy.  -----------------------------------------------------------------------------------------------------------------------------------    I have personally reviewed and interpreted all radiology studies and my findings are as above.  VENTILATOR SETTINGS:    Cultures 12/27 MRSA by PCR negative 12/27 respiratory virus panel positive influenza A     Antimicrobials:  Anti-infectives (From admission, onward)   Start     Stop   01/22/17 1200   levofloxacin (LEVAQUIN) tablet 750 mg  Status:  Discontinued     01/24/17 1622   01/21/17 0945  oseltamivir (TAMIFLU) capsule 30 mg     01/26/17 0959   01/20/17 1200  levofloxacin (LEVAQUIN) IVPB 750 mg  Status:  Discontinued     01/22/17 1119        Devices  LINES / TUBES:      Continuous Infusions:    Objective: Vitals:   02/02/17 0700 02/02/17 0709 02/02/17 0730 02/02/17 0800  BP: 128/69 128/69    Pulse: 88 96 89 (!) 103  Resp: 16 (!) 22 (!) 23 (!) 21  Temp:      TempSrc:      SpO2: 100% 100% 100% 100%  Weight:      Height:        Intake/Output Summary (Last 24 hours) at 02/02/2017 0810 Last data filed at 02/02/2017 0600 Gross per 24 hour  Intake 843 ml  Output 1900 ml  Net -1057 ml   Filed Weights   01/30/17 0349 02/01/17 0500 02/02/17 0500  Weight: 138 lb 0.1 oz (62.6 kg) 140 lb 6.9 oz (63.7 kg) 141 lb 1.5 oz (64 kg)     Physical Exam:  General: A/O 4, positive chronic respiratory distress Neck:  Negative scars, masses, torticollis, lymphadenopathy, JVD Lungs: Clear to auscultation bilaterally with decreased air movement, negative wheezes or crackles Cardiovascular: Regular rate and rhythm without murmur gallop or rub normal S1 and S2 Abdomen: negative abdominal pain, nondistended, positive soft, bowel sounds, no rebound, no ascites, no appreciable mass Extremities: No significant cyanosis, clubbing, or edema bilateral lower extremities Skin: Negative rashes, lesions, ulcers Psychiatric:  Negative depression, negative anxiety, negative fatigue, negative mania  Central nervous system:  Cranial nerves II through XII intact, tongue/uvula midline, all extremities muscle strength 5/5, sensation intact throughout, negative dysarthria, negative expressive aphasia, negative receptive aphasia.    Data Reviewed: Care during the described time interval was provided by me .  I have reviewed this patient's available data, including medical history, events of note,  physical examination, and all test results as part of my evaluation.   CBC: Recent Labs  Lab 01/26/17 0841 01/27/17 0619 01/28/17 0518 01/30/17 1019 01/31/17 0407  WBC 9.5 9.6 11.9* 11.7* 13.1*  HGB 10.8* 11.1* 10.3* 10.4* 9.2*  HCT 35.7* 35.0* 35.2* 35.6* 32.2*  MCV 94.4 93.1 95.7 98.1 97.6  PLT 155 154 169 PLATELET CLUMPS NOTED ON SMEAR, COUNT APPEARS DECREASED 167   Basic Metabolic Panel: Recent Labs  Lab 01/26/17 0841 01/27/17 0619 01/28/17 0518 01/30/17 0849 01/31/17 0407 02/01/17 0542  NA 136 134* 135 132* 135 138  K 3.8 4.1 4.6 5.2* 5.5* 4.8  CL 84* 85* 86* 82* 91* 90*  CO2 44* 43* 43* 43* 37* 41*  GLUCOSE 112* 111* 149* 78 173* 177*  BUN 25* 28* 39* 49* 37* 34*  CREATININE 0.70 0.65 0.73 1.28* 1.00 0.76  CALCIUM 9.1 8.8* 9.0 8.6* 8.6* 9.7  MG 1.9 2.1 2.1  --   --   --    GFR: Estimated Creatinine Clearance: 53.1 mL/min (by C-G formula based on SCr of 0.76 mg/dL). Liver Function Tests: Recent Labs  Lab 01/30/17 0849  AST 23  ALT 33  ALKPHOS 48  BILITOT 0.9  PROT 4.9*  ALBUMIN 2.7*   No results for input(s): LIPASE, AMYLASE in the last 168 hours. No results for input(s):  AMMONIA in the last 168 hours. Coagulation Profile: No results for input(s): INR, PROTIME in the last 168 hours. Cardiac Enzymes: No results for input(s): CKTOTAL, CKMB, CKMBINDEX, TROPONINI in the last 168 hours. BNP (last 3 results) No results for input(s): PROBNP in the last 8760 hours. HbA1C: No results for input(s): HGBA1C in the last 72 hours. CBG: Recent Labs  Lab 01/28/17 0724 01/29/17 0828 01/30/17 0818 01/31/17 0814 02/01/17 0802  GLUCAP 108* 88 81 164* 110*   Lipid Profile: No results for input(s): CHOL, HDL, LDLCALC, TRIG, CHOLHDL, LDLDIRECT in the last 72 hours. Thyroid Function Tests: No results for input(s): TSH, T4TOTAL, FREET4, T3FREE, THYROIDAB in the last 72 hours. Anemia Panel: No results for input(s): VITAMINB12, FOLATE, FERRITIN, TIBC, IRON,  RETICCTPCT in the last 72 hours. Urine analysis:    Component Value Date/Time   COLORURINE YELLOW 04/16/2016 1920   APPEARANCEUR HAZY (A) 04/16/2016 1920   LABSPEC 1.006 04/16/2016 1920   PHURINE 6.0 04/16/2016 1920   GLUCOSEU NEGATIVE 04/16/2016 1920   HGBUR SMALL (A) 04/16/2016 1920   BILIRUBINUR neg 11/24/2016 1131   KETONESUR NEGATIVE 04/16/2016 1920   PROTEINUR neg 11/24/2016 1131   PROTEINUR NEGATIVE 04/16/2016 1920   UROBILINOGEN 0.2 11/24/2016 1131   NITRITE neg 11/24/2016 1131   NITRITE POSITIVE (A) 04/16/2016 1920   LEUKOCYTESUR Small (1+) (A) 11/24/2016 1131   Sepsis Labs: @LABRCNTIP (procalcitonin:4,lacticidven:4)  ) No results found for this or any previous visit (from the past 240 hour(s)).       Radiology Studies: Dg Chest Port 1 View  Result Date: 02/01/2017 CLINICAL DATA:  Shortness of breath. EXAM: PORTABLE CHEST 1 VIEW COMPARISON:  01/30/2017.  01/22/2017.  01/10/2017.  10/17/2016. FINDINGS: Mediastinum and hilar structures normal. Heart size normal. COPD. Bibasilar atelectasis/infiltrates with bilateral pleural effusions. IMPRESSION: COPD. Bibasilar atelectasis and/or scarring again noted. Similar findings noted on prior exams. Electronically Signed   By: Maisie Fus  Register   On: 02/01/2017 07:45        Scheduled Meds: . aspirin EC  81 mg Oral Daily  . budesonide (PULMICORT) nebulizer solution  0.5 mg Nebulization BID  . busPIRone  15 mg Oral BID  . chlorhexidine  15 mL Mouth Rinse BID  . cholecalciferol  1,000 Units Oral Daily  . enoxaparin (LOVENOX) injection  40 mg Subcutaneous Q24H  . ipratropium  0.5 mg Nebulization TID  . levalbuterol  0.63 mg Nebulization TID  . mouth rinse  15 mL Mouth Rinse q12n4p  . metoprolol tartrate  25 mg Oral BID  . multivitamin with minerals  1 tablet Oral Daily  . predniSONE  60 mg Oral Q breakfast  . sodium chloride flush  3 mL Intravenous Q12H   Continuous Infusions:    LOS: 14 days    Time spent: 40  minutes    WOODS, Roselind Messier, MD [facility] Pager [phone]   If 7PM-7AM, please contact night-coverage [email or web address] Password [email or web address] 02/02/2017, 8:10 AM

## 2017-02-02 NOTE — Progress Notes (Addendum)
Physical Therapy Treatment Patient Details Name: Sheila Pierce MRN: [id number] DOB: 05-13-1945 Today's Date: 02/02/2017    History of Present Illness Pt is a 72 y/o female with medical history significant for chronic diastolic CHF, anxiety, and COPD with chronic hypercarbic and hypoxic respiratory failure, now presenting to the emergency department for increased dyspnea secondary to a COPD exacerbation    PT Comments    Pt admitted with above diagnosis. Pt currently with functional limitations due to balance and endurance deficits. Pt was able to pivot with mod to max assist of 2 persons.  Pt very weak and needs max encouragement. Pt will benefit from skilled PT to increase their independence and safety with mobility to allow discharge to the venue listed below.     Follow Up Recommendations  SNF;Supervision/Assistance - 24 hour     Equipment Recommendations  None recommended by PT    Recommendations for Other Services       Precautions / Restrictions Precautions Precautions: Fall Precaution Comments: watch SPO2, DROPLET Restrictions Weight Bearing Restrictions: No    Mobility  Bed Mobility Overal bed mobility: Needs Assistance Bed Mobility: Supine to Sit Rolling: Min assist(vc for sequencing)     Sit to supine: Max assist;+2 for physical assistance   General bed mobility comments: Pt needed assist to get back in bed.   Transfers Overall transfer level: Needs assistance Equipment used: 2 person hand held assist;Rolling walker (2 wheeled) Transfers: Sit to/from [employer] Sit to Stand: Mod assist;+2 physical assistance;Max assist Stand pivot transfers: Max assist;+2 physical assistance       General transfer comment: Tried to use the RW initially to stand but pt did better with +2 HHA.  Increased time and effort, cueing for technique, therapist blocking knees as they were buckling somewhat with pivotal movements.  Pt had BM therefore had to stand  several times to clean pt and she needed mod assist each time progressing to max assist as she fatigued.   Ambulation/Gait                 Stairs            Wheelchair Mobility    Modified Rankin (Stroke Patients Only)       Balance Overall balance assessment: Needs assistance Sitting-balance support: Feet supported Sitting balance-Leahy Scale: Fair Sitting balance - Comments: Sat EOB 2 min without UE support but needs min guard assist.    Standing balance support: Bilateral upper extremity supported;During functional activity Standing balance-Leahy Scale: Poor Standing balance comment: reliant on external supports                            Cognition Arousal/Alertness: Awake/alert Behavior During Therapy: Anxious;Flat affect Overall Cognitive Status: Impaired/Different from baseline Area of Impairment: Safety/judgement;Problem solving                         Safety/Judgement: Decreased awareness of safety;Decreased awareness of deficits   Problem Solving: Slow processing;Decreased initiation;Difficulty sequencing;Requires verbal cues;Requires tactile cues General Comments: cognition not formally assessed but [facility] for general conversation      Exercises General Exercises - Lower Extremity Ankle Circles/Pumps: AROM;Both;5 reps;Seated    General Comments General comments (skin integrity, edema, etc.): NT to replace purewick.       Pertinent Vitals/Pain Pain Assessment: No/denies pain  VSS   Home Living  Prior Function            PT Goals (current goals can now be found in the care plan section) Acute Rehab PT Goals Patient Stated Goal: to feel better Progress towards PT goals: Progressing toward goals    Frequency    Min 2X/week      PT Plan Current plan remains appropriate    Co-evaluation              AM-PAC PT "6 Clicks" Daily Activity  Outcome Measure  Difficulty turning  over in bed (including adjusting bedclothes, sheets and blankets)?: Unable Difficulty moving from lying on back to sitting on the side of the bed? : Unable Difficulty sitting down on and standing up from a chair with arms (e.g., wheelchair, bedside commode, etc,.)?: Unable Help needed moving to and from a bed to chair (including a wheelchair)?: A Lot Help needed walking in hospital room?: Total Help needed climbing 3-5 steps with a railing? : Total 6 Click Score: 7    End of Session Equipment Utilized During Treatment: Oxygen;Gait belt Activity Tolerance: Patient limited by fatigue Patient left: in bed;with call bell/phone within reach Nurse Communication: Mobility status PT Visit Diagnosis: Other abnormalities of gait and mobility (R26.89)     Time: 1345-1409 PT Time Calculation (min) (ACUTE ONLY): 24 min  Charges:  $Therapeutic Activity: 23-37 mins                    G Codes:       Kennetta Pavlovic,PT Acute Rehabilitation [phone] (pager)    Berline Lopes 02/02/2017, 2:55 PM

## 2017-02-02 NOTE — Care Management Important Message (Signed)
Important Message  Patient Details  Name: Sheila Pierce MRN: [id number] Date of Birth: May 15, 1945   Medicare Important Message Given:  Yes    [name] Abena 02/02/2017, 11:06 AM

## 2017-02-02 NOTE — Plan of Care (Signed)
Pt calm and cooperative with care, progressing in all areas

## 2017-02-03 LAB — MAGNESIUM: Magnesium: 1.8 mg/dL (ref 1.7–2.4)

## 2017-02-03 LAB — BASIC METABOLIC PANEL
Anion gap: 10 (ref 5–15)
BUN: 24 mg/dL — AB (ref 6–20)
CALCIUM: 9.6 mg/dL (ref 8.9–10.3)
CO2: 48 mmol/L — AB (ref 22–32)
CREATININE: 0.77 mg/dL (ref 0.44–1.00)
Chloride: 79 mmol/L — ABNORMAL LOW (ref 101–111)
GFR calc Af Amer: >60 mL/min (ref >60)
GFR calc non Af Amer: >60 mL/min (ref >60)
GLUCOSE: 182 mg/dL — AB (ref 65–99)
Potassium: 3.8 mmol/L (ref 3.5–5.1)
Sodium: 137 mmol/L (ref 135–145)

## 2017-02-03 LAB — GLUCOSE, CAPILLARY: Glucose-Capillary: 81 mg/dL (ref 65–99)

## 2017-02-03 MED ORDER — LORAZEPAM 2 MG/ML IJ SOLN
1.0000 mg | Freq: Once | INTRAMUSCULAR | Status: AC
  Administered 2017-02-03: 1 mg via INTRAVENOUS

## 2017-02-03 MED ORDER — FUROSEMIDE 40 MG PO TABS
40.0000 mg | ORAL_TABLET | Freq: Every day | ORAL | Status: DC
  Administered 2017-02-04 – 2017-02-05 (×2): 40 mg via ORAL
  Filled 2017-02-03 (×2): qty 1

## 2017-02-03 MED ORDER — METOPROLOL TARTRATE 12.5 MG HALF TABLET
12.5000 mg | ORAL_TABLET | Freq: Two times a day (BID) | ORAL | Status: DC
  Administered 2017-02-03 – 2017-02-05 (×4): 12.5 mg via ORAL
  Filled 2017-02-03 (×4): qty 1

## 2017-02-03 NOTE — Progress Notes (Addendum)
5pm CSW and RNCM called pt son to inform that home health cannot come into home due to current condition- son reports home being condemned.  RNCM attempted to help with obtaining triology for patient but no home services can be provided in current home environment.  Pt son continues to not want LTC placement for mom through Medicaid.  CSW spoke with pt and updated- she confirms she would be willing to go somewhere short term but not willing to consider long term placement.  CSW called [facility] and made report- they will call CSW will determination.  2:30pm [facility] has denied pt rehab stay- CSW informed pt son who is not interested in patient being placed long term at SNF and will take patient home.  Pt son currently in [place] and will not be able to pick patient up until Saturday Morning  CSW updated RNCM who is assisting with getting home health set up  Confirmed pt address on chart is current address though they will be moving in a few weeks to [address], [place]  Burna Sis, [place] Clinical Social Worker [phone]

## 2017-02-03 NOTE — Progress Notes (Signed)
PROGRESS NOTE    RUCHA WISSINGER  QIH:[id number] DOB: [birth year] DOA: 01/19/2017 PCP: [name], MD   Brief Narrative:  72 y.o. WF PMHx   Anxiety, Depression, NSTEMI, Chronic Diastolic CHF, HTN, QT prolongation, HLD, COPD, Chronic resp failure on home O2 3 L via [place],Tobacco abuse, CKD, Diabetes mellitus without complication,  Anemia, Trigeminal neuralgia  Presenting to the emergency department for increased dyspnea.  Patient reports chronic shortness of breath, but notes that this has decreased markedly in recent days point where she is now unable to speak more than 1-2 words without gasping for air.  Denies fevers or chills and denies chest pain or palpitations.   ED Course: Upon arrival to the ED, patient is found to be afebrile, saturating adequately on 3 L/min of supplemental oxygen, tachypneic, tachycardic, and with stable blood pressure.  EKG features a sinus tachycardia with rate 110 and chest x-ray is negative for acute cardiopulmonary disease.  Chemistry panel reveals a slight hyponatremia carbonate of 35.  BUN to creatinine ratio is elevated.  CBC is unremarkable.  ABG reveals pH 7.26, pCO2 88, pO2 102.  Patient was treated with continuous albuterol neb, 2 g IV magnesium, 125 mg IV Solu-Medrol, and started on BiPAP.  She remains hemodynamically stable, remains markedly dyspneic, and will be admitted to the stepdown unit for ongoing evaluation and management of acute exacerbation in COPD.    Subjective: 1/10  A/O 4, sitting in chair comfortably, positive chronic restaurant distress, negative CP, negative abdominal pain, negative N/V.        Assessment & Plan:   Active Problems:   CAD (coronary artery disease)   Anxiety   COPD exacerbation (HCC)   Chronic diastolic CHF (congestive heart failure) (HCC)   Acute on chronic respiratory failure with hypercapnia (HCC)   COPD with acute exacerbation (HCC)   Sepsis unspecified organism? -Patient meets criteria on admission  HR> 90, RR> 20. In addition patient has lactic acidosis 2.3 -Completed course of Lovenox and Tamiflu -Out of bed to chair for all meals -Ambulate patient daily   positive influenza A pneumonia -See sepsis  Acute on Chronic Respiratory Failure with Hypoxia and hypercarbia/COPD exacerbation -DuoNeb TID  -Pulmicort nebulizer - Breo-Ellipta was Discontinued secondary to patient's poor inspiratory effort (i.e. patient not obtaining sufficient amount of medication into long)  -Prednisone 60 mg daily: Titrate slowly down over the next couple of weeks. -Mucinex DM -Flutter valve -BiPAP QHS and PRN sleeping (Pt cannot refuse treatment she decompensates). If patient refuses contact on-call physician  -RN instructions ENSURE patient wears BiPAP notify on-call physician when BiPAP discontinued.  Chronic Diastolic CHF (base weight?) -EKG from 01/19/2017 compared to EKG 01/01/2017 no significant change. Sinus tachycardia -Strict in and out since admission +4.3 L -Daily weight Filed Weights   02/01/17 0500 02/02/17 0500 02/03/17 0326  Weight: 140 lb 6.9 oz (63.7 kg) 141 lb 1.5 oz (64 kg) 143 lb 4.8 oz (65 kg)  -1/9 increase Lasix 40 mg daily: Maintain patient euvolemic  -Hydralazine PRN -1/9 decrease Metoprolol 12.5 mg BID (patient constantly feels fatigued allow BP to rise)  CAD  -Symptomatic -ASA 81 mg daily -See CHF  Anxiety -BuSpar 50 mg daily -Ativan PRN   Goals of care -At no insurance denied patient admission SNF. Patient clearly unable to care for herself. Patient is weak, deconditioned. Son is out of state. Would not be safe to discharge home alone.   DVT prophylaxis: Lovenox Code Status: DNR Family Communication: None Disposition Plan: TBD  Consultants:  None   Procedures/Significant Events:  12/17/16 Echocardiogram: LVEF=:65% to 70%.-Grade 1 diastolic dysfunction). - Atrial septum: There was increased thickness of the septum, c/w lipomatous hypertrophy.   -----------------------------------------------------------------------------------------------------------------------------------    I have personally reviewed and interpreted all radiology studies and my findings are as above.  VENTILATOR SETTINGS:    Cultures 12/27 MRSA by PCR negative 12/27 respiratory virus panel positive influenza A     Antimicrobials: Anti-infectives (From admission, onward)   Start     Stop   01/22/17 1200  levofloxacin (LEVAQUIN) tablet 750 mg  Status:  Discontinued     01/24/17 1622   01/21/17 0945  oseltamivir (TAMIFLU) capsule 30 mg     01/26/17 0959   01/20/17 1200  levofloxacin (LEVAQUIN) IVPB 750 mg  Status:  Discontinued     01/22/17 1119        Devices  LINES / TUBES:      Continuous Infusions:    Objective: Vitals:   02/02/17 2237 02/03/17 0031 02/03/17 0326 02/03/17 0700  BP: (!) 126/59 (!) 104/51 127/76 123/62  Pulse:  81 85 90  Resp:  18 16 13   Temp:  98 F (36.7 C) 97.9 F (36.6 C) 97.7 F (36.5 C)  TempSrc:  Axillary Axillary Oral  SpO2:  98% 99% 100%  Weight:   143 lb 4.8 oz (65 kg)   Height:        Intake/Output Summary (Last 24 hours) at 02/03/2017 0749 Last data filed at 02/02/2017 2211 Gross per 24 hour  Intake 1083 ml  Output -  Net 1083 ml   Filed Weights   02/01/17 0500 02/02/17 0500 02/03/17 0326  Weight: 140 lb 6.9 oz (63.7 kg) 141 lb 1.5 oz (64 kg) 143 lb 4.8 oz (65 kg)     Physical Exam:  General: A/O 4, positive chronic respiratory distress.  Neck:  Negative scars, masses, torticollis, lymphadenopathy, JVD Lungs: clear to auscultation bilaterally with decreased air movement, negative wheezes or crackles s Cardiovascular: Regular rate and rhythm without murmur gallop or rub normal S1 and S2 Abdomen: negative abdominal pain, nondistended, positive soft, bowel sounds, no rebound, no ascites, no appreciable mass Extremities: No significant cyanosis, clubbing, or edema bilateral lower  extremities Skin: Negative rashes, lesions, ulcers Psychiatric:  Negative depression, negative anxiety, negative fatigue, negative mania, poor understanding of disease process. Patient malingering. Central nervous system:  Cranial nerves II through XII intact, tongue/uvula midline, all extremities muscle strength 5/5, sensation intact throughout,  negative dysarthria, negative expressive aphasia, negative receptive aphasia.    Data Reviewed: Care during the described time interval was provided by me .  I have reviewed this patient's available data, including medical history, events of note, physical examination, and all test results as part of my evaluation.   CBC: Recent Labs  Lab 01/28/17 0518 01/30/17 1019 01/31/17 0407  WBC 11.9* 11.7* 13.1*  HGB 10.3* 10.4* 9.2*  HCT 35.2* 35.6* 32.2*  MCV 95.7 98.1 97.6  PLT 169 PLATELET CLUMPS NOTED ON SMEAR, COUNT APPEARS DECREASED 167   Basic Metabolic Panel: Recent Labs  Lab 01/28/17 0518 01/30/17 0849 01/31/17 0407 02/01/17 0542 02/02/17 0926  NA 135 132* 135 138 137  K 4.6 5.2* 5.5* 4.8 4.8  CL 86* 82* 91* 90* 85*  CO2 43* 43* 37* 41* 43*  GLUCOSE 149* 78 173* 177* 144*  BUN 39* 49* 37* 34* 27*  CREATININE 0.73 1.28* 1.00 0.76 0.67  CALCIUM 9.0 8.6* 8.6* 9.7 9.7  MG 2.1  --   --   --  1.9   GFR: Estimated Creatinine Clearance: 53.5 mL/min (by C-G formula based on SCr of 0.67 mg/dL). Liver Function Tests: Recent Labs  Lab 01/30/17 0849  AST 23  ALT 33  ALKPHOS 48  BILITOT 0.9  PROT 4.9*  ALBUMIN 2.7*   No results for input(s): LIPASE, AMYLASE in the last 168 hours. No results for input(s): AMMONIA in the last 168 hours. Coagulation Profile: No results for input(s): INR, PROTIME in the last 168 hours. Cardiac Enzymes: No results for input(s): CKTOTAL, CKMB, CKMBINDEX, TROPONINI in the last 168 hours. BNP (last 3 results) No results for input(s): PROBNP in the last 8760 hours. HbA1C: No results for input(s):  HGBA1C in the last 72 hours. CBG: Recent Labs  Lab 01/30/17 0818 01/31/17 0814 02/01/17 0802 02/02/17 0812 02/03/17 0720  GLUCAP 81 164* 110* 115* 81   Lipid Profile: No results for input(s): CHOL, HDL, LDLCALC, TRIG, CHOLHDL, LDLDIRECT in the last 72 hours. Thyroid Function Tests: No results for input(s): TSH, T4TOTAL, FREET4, T3FREE, THYROIDAB in the last 72 hours. Anemia Panel: No results for input(s): VITAMINB12, FOLATE, FERRITIN, TIBC, IRON, RETICCTPCT in the last 72 hours. Urine analysis:    Component Value Date/Time   COLORURINE YELLOW 04/16/2016 1920   APPEARANCEUR HAZY (A) 04/16/2016 1920   LABSPEC 1.006 04/16/2016 1920   PHURINE 6.0 04/16/2016 1920   GLUCOSEU NEGATIVE 04/16/2016 1920   HGBUR SMALL (A) 04/16/2016 1920   BILIRUBINUR neg 11/24/2016 1131   KETONESUR NEGATIVE 04/16/2016 1920   PROTEINUR neg 11/24/2016 1131   PROTEINUR NEGATIVE 04/16/2016 1920   UROBILINOGEN 0.2 11/24/2016 1131   NITRITE neg 11/24/2016 1131   NITRITE POSITIVE (A) 04/16/2016 1920   LEUKOCYTESUR Small (1+) (A) 11/24/2016 1131   Sepsis Labs: @LABRCNTIP (procalcitonin:4,lacticidven:4)  ) No results found for this or any previous visit (from the past 240 hour(s)).       Radiology Studies: No results found.      Scheduled Meds: . aspirin EC  81 mg Oral Daily  . budesonide (PULMICORT) nebulizer solution  0.5 mg Nebulization BID  . busPIRone  15 mg Oral BID  . chlorhexidine  15 mL Mouth Rinse BID  . cholecalciferol  1,000 Units Oral Daily  . enoxaparin (LOVENOX) injection  40 mg Subcutaneous Q24H  . furosemide  20 mg Oral Daily  . ipratropium  0.5 mg Nebulization TID  . levalbuterol  0.63 mg Nebulization TID  . mouth rinse  15 mL Mouth Rinse q12n4p  . metoprolol tartrate  25 mg Oral BID  . multivitamin with minerals  1 tablet Oral Daily  . predniSONE  60 mg Oral Q breakfast  . sodium chloride flush  3 mL Intravenous Q12H   Continuous Infusions:    LOS: 15 days     Time spent: 40 minutes    WOODS, Roselind Messier, MD [facility] Pager [phone]   If 7PM-7AM, please contact night-coverage [email or web address] Password [facility] 02/03/2017, 7:49 AM

## 2017-02-03 NOTE — Progress Notes (Signed)
Call placed to [facility] oncall for perameters for prn hydralazine for elevated blood pressure.

## 2017-02-03 NOTE — Progress Notes (Signed)
Patient nervous and restless.  Ativan given IVP as per prn order.  Patient remains on 3LPM via [place].  Heart rate remains in the the one-teens.  Respiratory made aware of need for breathing treatments and placing patient on BiPap.

## 2017-02-03 NOTE — Progress Notes (Addendum)
Acute resp failure, postive flu, copd ex, chronic chf, conts on bipap, iv ativan for anxiety. Plan is for SNF.  She previously refused, CSW to speak with family is still refusing then will need HH ([place] with [place]), discussed in LOS 1/3.  1/10 1637 Letha Cape RN, BSN- SNF was denied by [employer] today per CSW, peer to peer was not done before cut off time.  Patient's son [name] states he will take patient home, he states he is in [place]. NCM informed him that could not get a HH agency to go out to a condemed home, he said he knows , in two weeks he will be using her check to get them another place to stay.   He does not want to put her in a long term SNF because this is not what patient wants per [name] and he promised her he would not do that.  NCM informed son that patient will need to be on a triology vent for bipap, confirmed the address is correct. Will need MD to order this when find out if she qualifies for it, will need settings.  Also CSW is calling APS to implore emergency guardianship for patient.   1/11 1232 Letha Cape RN, BSN - patient does qualify for a triology per [facility] with [facility].  MD will order. , [facility] will check to make sure patient does have electricity in the home for the triology before patient is discharged and will inform MD.  The son states he will pick patient up between 12 an 2 pm on Saturday.  NCM left handoff for weekend NCM.

## 2017-02-03 NOTE — Progress Notes (Signed)
Occupational Therapy Treatment Patient Details Name: Sheila Pierce MRN: [id number] DOB: 1945-06-22 Today's Date: 02/03/2017    History of present illness Pt is a 72 y/o female with medical history significant for chronic diastolic CHF, anxiety, and COPD with chronic hypercarbic and hypoxic respiratory failure, now presenting to the emergency department for increased dyspnea secondary to a COPD exacerbation   OT comments  Pt making progress towards OT goals today. Pt was able to perform sit to stand for peri care and short mobilization for back to bed this session with +2 mod A with RW. Pt very emotional during session crying about current situation and how weak she feels. Encouraged and educated Pt that small movement more frequently will assist with building up activity tolerance and strength. This clinician still believes that Pt requires SNF level therapy for safety and to maximize independence in ADL, the increased frequency of therapy vc HH services is essential for her getting back to her baseline - which she is not currently at. She is requiring significantly increased support for ADL and functional transfers. If insurance continues to deny Pt then she will need max HH services ([facility] HH services stopped going to her home due to bug infestation) she is supposedly moving in 2 weeks - another reason that a short term stay would be a better and safer option than home.    Follow Up Recommendations  SNF;Supervision/Assistance - 24 hour    Equipment Recommendations  Other (comment)(defer to next venue)    Recommendations for Other Services      Precautions / Restrictions Precautions Precautions: Fall Precaution Comments: watch O2 levels Restrictions Weight Bearing Restrictions: No       Mobility Bed Mobility Overal bed mobility: Needs Assistance Bed Mobility: Sit to Supine       Sit to supine: Max assist;+2 for physical assistance   General bed mobility comments: helicopter  technique used   Transfers Overall transfer level: Needs assistance Equipment used: 2 person hand held assist;Rolling walker (2 wheeled) Transfers: Sit to/from Stand Sit to Stand: Max assist;+2 physical assistance;+2 safety/equipment         General transfer comment: Pt performed sit to stand x2 for peri care and then able to use RW with support to mobilize to bed    Balance Overall balance assessment: Needs assistance Sitting-balance support: Bilateral upper extremity supported;Feet supported Sitting balance-Leahy Scale: Poor     Standing balance support: Bilateral upper extremity supported;During functional activity Standing balance-Leahy Scale: Poor Standing balance comment: reliant on external supports                           ADL either performed or assessed with clinical judgement   ADL Overall ADL's : Needs assistance/impaired                         Toilet Transfer: Moderate assistance;+2 for physical assistance;+2 for safety/equipment;Ambulation;RW Toilet Transfer Details (indicate cue type and reason): simulated through recliner to bed transfer Toileting- Clothing Manipulation and Hygiene: +2 for safety/equipment;Maximal assistance;+2 for physical assistance;Sit to/from stand Toileting - Clothing Manipulation Details (indicate cue type and reason): performed sit <> stand x 2 for peri care     Functional mobility during ADLs: Moderate assistance;+2 for physical assistance;+2 for safety/equipment;Rolling walker General ADL Comments: Pt labile/emotional with mobility for ADL this session     Vision       Perception     Praxis  Cognition Arousal/Alertness: Awake/alert Behavior During Therapy: Anxious;Agitated("I'm sorry I was so ugly to you") Overall Cognitive Status: Impaired/Different from baseline Area of Impairment: Safety/judgement;Problem solving                         Safety/Judgement: Decreased awareness of  safety;Decreased awareness of deficits   Problem Solving: Slow processing;Decreased initiation;Difficulty sequencing;Requires verbal cues;Requires tactile cues General Comments: labile during session, crying when asked to walk to return to bed        Exercises     Shoulder Instructions       General Comments      Pertinent Vitals/ Pain       Pain Assessment: Faces Faces Pain Scale: Hurts little more Pain Location: generalized Pain Descriptors / Indicators: Aching;Grimacing;Guarding Pain Intervention(s): Monitored during session;Repositioned  Home Living                                          Prior Functioning/Environment              Frequency  Min 2X/week        Progress Toward Goals  OT Goals(current goals can now be found in the care plan section)  Progress towards OT goals: Progressing toward goals  Acute Rehab OT Goals Patient Stated Goal: to feel better OT Goal Formulation: With patient Time For Goal Achievement: 02/03/17 Potential to Achieve Goals: Fair  Plan Discharge plan remains appropriate;Frequency remains appropriate    Co-evaluation                 AM-PAC PT "6 Clicks" Daily Activity     Outcome Measure   Help from another person eating meals?: A Little Help from another person taking care of personal grooming?: A Little Help from another person toileting, which includes using toliet, bedpan, or urinal?: A Lot Help from another person bathing (including washing, rinsing, drying)?: A Lot Help from another person to put on and taking off regular upper body clothing?: A Lot Help from another person to put on and taking off regular lower body clothing?: A Lot 6 Click Score: 14    End of Session Equipment Utilized During Treatment: Oxygen  OT Visit Diagnosis: Muscle weakness (generalized) (M62.81);Other abnormalities of gait and mobility (R26.89)   Activity Tolerance Patient limited by fatigue   Patient Left in  bed;with call bell/phone within reach;with bed alarm set;with nursing/sitter in room   Nurse Communication Mobility status(in room with Pt, purewick re-applied)        Time: 1610-9604 OT Time Calculation (min): 21 min  Charges: OT General Charges $OT Visit: 1 Visit OT Treatments $Self Care/Home Management : 8-22 mins  Sherryl Manges OTR/L [phone]   Evern Bio Adonay Scheier 02/03/2017, 4:06 PM

## 2017-02-03 NOTE — Progress Notes (Signed)
Call placed to patient's son Scotty to notify him that his mom is pending transfer.  No room or floor is updated as of yet.

## 2017-02-04 LAB — GLUCOSE, CAPILLARY: GLUCOSE-CAPILLARY: 94 mg/dL (ref 65–99)

## 2017-02-04 NOTE — Progress Notes (Signed)
Patient had evening snack.  Fluid restriction maintained.  Patient is anxious ativan given per prn order, patient complained of general discomfort tramadol 50 mg given per prn order.  Patient complained of congestion Mucinex given per prn order.  Patient remains on 3 lpm via Nasal canula.  Oxygen saturations greater than 92%.  No signs or symptoms of respiratory distress.  Will continue to monitor and maintain safety and comfort.  Call bell remains within reach.

## 2017-02-04 NOTE — Progress Notes (Signed)
Physical Therapy Treatment Patient Details Name: Keylin M Goffredo MRN: [id number] DOB: 01/31/1945 Today's Date: 02/04/2017    History of Present Illness Pt is a 72 y/o female with medical history significant for chronic diastolic CHF, anxiety, and COPD with chronic hypercarbic and hypoxic respiratory failure, now presenting to the emergency department for increased dyspnea secondary to a COPD exacerbation    PT Comments    Pt admitted with above diagnosis. Pt currently with functional limitations due to the deficits listed below (see PT Problem List). Pt needed incr encouragement to get OOB.  Pt with BM once she stood with mod assist (2nd person to clean pt) therefore transferred to 3N1.  Pt used bathroom and then stood 2 x to be cleaned and transferred to recliner with mod assist.  Pt met 0/4 goals set as she has made slow progress due to self limiting and poor respiratory reserve.  Revised goals.  Plan is for pt to go home and this PT recommends HH f/u and hoyer lift.   Pt will benefit from skilled PT to increase their independence and safety with mobility to allow discharge to the venue listed below.     Follow Up Recommendations  Home health PT;Supervision/Assistance - 24 hour     Equipment Recommendations  Other (comment)(hoyer lift recommended )    Recommendations for Other Services       Precautions / Restrictions Precautions Precautions: Fall Precaution Comments: watch O2 levels Restrictions Weight Bearing Restrictions: No    Mobility  Bed Mobility Overal bed mobility: Needs Assistance Bed Mobility: Supine to Sit Rolling: (vc for sequencing)   Supine to sit: Min assist     General bed mobility comments: pt initiates movement of LEs to EOB.  Needed assist to elevate trunk.   Transfers Overall transfer level: Needs assistance Equipment used: 2 person hand held assist;Rolling walker (2 wheeled) Transfers: Sit to/from Stand Sit to Stand: +2 safety/equipment;Mod  assist Stand pivot transfers: Mod assist;+2 safety/equipment       General transfer comment: Pt performed sit to stand x4 for peri care as she had BM.  Stood 2 x from bed and then pt transferred to 3N1.  Once done on 3N1 stood x 2 and then transferred to recliner.  Pt initially stood well with less assist but the more stands she did, the more fatigued she got needing > assist the last stand with more assist to pivot to the chair the last attempt to stand.   Ambulation/Gait                 Stairs            Wheelchair Mobility    Modified Rankin (Stroke Patients Only)       Balance Overall balance assessment: Needs assistance Sitting-balance support: Bilateral upper extremity supported;Feet supported Sitting balance-Leahy Scale: Poor Sitting balance - Comments: Sat EOB 2 min without UE support but needs min guard assist.    Standing balance support: Bilateral upper extremity supported;During functional activity Standing balance-Leahy Scale: Poor Standing balance comment: reliant on external supports                            Cognition Arousal/Alertness: Awake/alert Behavior During Therapy: Anxious;Agitated("I'm sorry I was so ugly to you") Overall Cognitive Status: Impaired/Different from baseline Area of Impairment: Safety/judgement;Problem solving                           Safety/Judgement: Decreased awareness of safety;Decreased awareness of deficits   Problem Solving: Slow processing;Decreased initiation;Difficulty sequencing;Requires verbal cues;Requires tactile cues        Exercises General Exercises - Lower Extremity Ankle Circles/Pumps: AROM;Both;5 reps;Seated Heel Slides: AROM;Both;5 reps;Supine Hip ABduction/ADduction: AROM;Both;5 reps;Supine    General Comments General comments (skin integrity, edema, etc.): Nurse placed purewick at end of session      Pertinent Vitals/Pain Pain Assessment: Faces Faces Pain Scale: Hurts  little more Pain Location: generalized Pain Descriptors / Indicators: Aching;Grimacing;Guarding Pain Intervention(s): Limited activity within patient's tolerance;Monitored during session;Repositioned   O2 sats high 80's with activity with O2 in place, other VSs. Home Living                      Prior Function            PT Goals (current goals can now be found in the care plan section) Acute Rehab PT Goals Patient Stated Goal: to feel better PT Goal Formulation: With patient Time For Goal Achievement: 02/17/17 Potential to Achieve Goals: Fair Progress towards PT goals: Progressing toward goals    Frequency    Min 2X/week      PT Plan Discharge plan needs to be updated    Co-evaluation              AM-PAC PT "6 Clicks" Daily Activity  Outcome Measure  Difficulty turning over in bed (including adjusting bedclothes, sheets and blankets)?: Unable Difficulty moving from lying on back to sitting on the side of the bed? : Unable Difficulty sitting down on and standing up from a chair with arms (e.g., wheelchair, bedside commode, etc,.)?: Unable Help needed moving to and from a bed to chair (including a wheelchair)?: A Lot Help needed walking in hospital room?: Total Help needed climbing 3-5 steps with a railing? : Total 6 Click Score: 7    End of Session Equipment Utilized During Treatment: Oxygen;Gait belt Activity Tolerance: Patient limited by fatigue Patient left: with call bell/phone within reach;in chair;with chair alarm set Nurse Communication: Mobility status PT Visit Diagnosis: Other abnormalities of gait and mobility (R26.89)     Time: 1151-1217 PT Time Calculation (min) (ACUTE ONLY): 26 min  Charges:  $Therapeutic Activity: 8-22 mins $Self Care/Home Management: 8-22                    G Codes:       Dawn White,PT Acute Rehabilitation [phone] (pager)    Dawn F White 02/04/2017, 1:19 PM   

## 2017-02-04 NOTE — Progress Notes (Signed)
Patient remained restless/anxious texted [facility] call via Amion and received another order for Ativan 1mg  x 1 dose.  Patient resting comfortably at this time.

## 2017-02-04 NOTE — Progress Notes (Addendum)
PROGRESS NOTE    Sheila Pierce  ZOX:[id number] DOB: 1945/04/01 DOA: 01/19/2017 PCP: [name], MD   Brief Narrative:  72 y.o. WF PMHx   Anxiety, Depression, NSTEMI, Chronic Diastolic CHF, HTN, QT prolongation, HLD, COPD, Chronic resp failure on home O2 3 L via ,Tobacco abuse, CKD, Diabetes mellitus without complication,  Anemia, Trigeminal neuralgia  Presenting to the emergency department for increased dyspnea.  Patient reports chronic shortness of breath, but notes that this has decreased markedly in recent days point where she is now unable to speak more than 1-2 words without gasping for air.  Denies fevers or chills and denies chest pain or palpitations.   ED Course: Upon arrival to the ED, patient is found to be afebrile, saturating adequately on 3 L/min of supplemental oxygen, tachypneic, tachycardic, and with stable blood pressure.  EKG features a sinus tachycardia with rate 110 and chest x-ray is negative for acute cardiopulmonary disease.  Chemistry panel reveals a slight hyponatremia carbonate of 35.  BUN to creatinine ratio is elevated.  CBC is unremarkable.  ABG reveals pH 7.26, pCO2 88, pO2 102.  Patient was treated with continuous albuterol neb, 2 g IV magnesium, 125 mg IV Solu-Medrol, and started on BiPAP.  She remains hemodynamically stable, remains markedly dyspneic, and will be admitted to the stepdown unit for ongoing evaluation and management of acute exacerbation in COPD.    Subjective: 1/11 A/O 4 sitting in chair comfortably, positive chronic respiratory distress, negative CP, negative abdominal pain, negative N/V      Assessment & Plan:   Active Problems:   CAD (coronary artery disease)   Anxiety   COPD exacerbation (HCC)   Chronic diastolic CHF (congestive heart failure) (HCC)   Acute on chronic respiratory failure with hypercapnia (HCC)   COPD with acute exacerbation (HCC)   Sepsis unspecified organism? -Patient meets criteria on admission HR> 90,  RR> 20. In addition patient has lactic acidosis 2.3 -Completed course of Lovenox and Tamiflu -Out of bed to chair for all meals -Ambulate patient daily   positive influenza A pneumonia -See sepsis  Acute on Chronic Respiratory Failure with Hypoxia and hypercarbia/COPD exacerbation -DuoNeb TID  -Pulmicort nebulizer - Breo-Ellipta was Discontinued secondary to patient's poor inspiratory effort (i.e. patient not obtaining sufficient amount of medication into long)  -Prednisone 60 mg daily: Titrate slowly down over the next couple of weeks. -Mucinex DM -Flutter valve -BiPAP QHS and PRN sleeping (Pt cannot refuse treatment she decompensates). If patient refuses contact on-call physician  -RN instructions ENSURE patient wears BiPAP notify on-call physician when BiPAP discontinued. -Continues to exhibit signs of hypercapnia associated chronic respiratory failure secondary to severe COPD. Patient requires this and IV both QHS and daytime to help with exacerbation.. Use of an IV will treat patient's high PCO2 levels and reduce risk of exacerbations hospitalization when used at night and during the day. Patient will need these advanced settings in conjunction with her current medication regimen; BiPAP was not an option due to his functional limitations in severity of the patient's condition. Failure to have an IV available for use over 24 hour period could lead to DEATH  Chronic Diastolic CHF (base weight?) -EKG from 01/19/2017 compared to EKG 01/01/2017 no significant change. Sinus tachycardia -Strict in and out since admission +4.3 L -Daily weight Filed Weights   02/02/17 0500 02/03/17 0326 02/04/17 0437  Weight: 141 lb 1.5 oz (64 kg) 143 lb 4.8 oz (65 kg) 139 lb 5.3 oz (63.2 kg)  -1/9 increase  Lasix 40 mg daily: Maintain patient euvolemic  -Hydralazine PRN -1/9 decrease Metoprolol 12.5 mg BID (patient constantly feels fatigued allow BP to rise)  CAD  -Symptomatic -ASA 81 mg daily -See  CHF  Anxiety -BuSpar 50 mg daily -Ativan PRN   Goals of care -At no insurance denied patient admission SNF. Patient clearly unable to care for herself. Patient is weak, deconditioned. Son is out of state. Would not be safe to discharge home alone.   DVT prophylaxis: Lovenox Code Status: DNR Family Communication: None Disposition Plan: TBD   Consultants:  None   Procedures/Significant Events:  12/17/16 Echocardiogram: LVEF=:65% to 70%.-Grade 1 diastolic dysfunction). - Atrial septum: There was increased thickness of the septum, c/w lipomatous hypertrophy.  -----------------------------------------------------------------------------------------------------------------------------------    I have personally reviewed and interpreted all radiology studies and my findings are as above.  VENTILATOR SETTINGS:    Cultures 12/27 MRSA by PCR negative 12/27 respiratory virus panel positive influenza A     Antimicrobials: Anti-infectives (From admission, onward)   Start     Stop   01/22/17 1200  levofloxacin (LEVAQUIN) tablet 750 mg  Status:  Discontinued     01/24/17 1622   01/21/17 0945  oseltamivir (TAMIFLU) capsule 30 mg     01/26/17 0959   01/20/17 1200  levofloxacin (LEVAQUIN) IVPB 750 mg  Status:  Discontinued     01/22/17 1119        Devices  LINES / TUBES:      Continuous Infusions:    Objective: Vitals:   02/04/17 0400 02/04/17 0437 02/04/17 0500 02/04/17 0600  BP: 135/62  (!) 123/54 (!) 136/57  Pulse: (!) 101  98 (!) 104  Resp: 17  18 19   Temp:      TempSrc:      SpO2: 100%  100% (!) 84%  Weight:  139 lb 5.3 oz (63.2 kg)    Height:        Intake/Output Summary (Last 24 hours) at 02/04/2017 0843 Last data filed at 02/03/2017 2013 Gross per 24 hour  Intake 813 ml  Output -  Net 813 ml   Filed Weights   02/02/17 0500 02/03/17 0326 02/04/17 0437  Weight: 141 lb 1.5 oz (64 kg) 143 lb 4.8 oz (65 kg) 139 lb 5.3 oz (63.2 kg)      Physical Exam:  General: A/O 4, positive chronic respiratory distress (back to baseline), extremely deconditioned Neck:  Negative scars, masses, torticollis, lymphadenopathy, JVD Lungs: mild diffuse rhonchi, negative wheezes or crackles Cardiovascular: Regular rate and rhythm without murmur gallop or rub normal S1 and S2 Abdomen: negative abdominal pain, nondistended, positive soft, bowel sounds, no rebound, no ascites, no appreciable mass Extremities: No significant cyanosis, clubbing, or edema bilateral lower extremities Skin: Negative rashes, lesions, ulcers Psychiatric:  Negative depression, negative anxiety, negative fatigue, negative mania, malingering  Central nervous system:  Cranial nerves II through XII intact, tongue/uvula midline, all extremities muscle strength 5/5, sensation intact throughout, negative dysarthria, negative expressive aphasia, negative receptive aphasia.    Data Reviewed: Care during the described time interval was provided by me .  I have reviewed this patient's available data, including medical history, events of note, physical examination, and all test results as part of my evaluation.   CBC: Recent Labs  Lab 01/30/17 1019 01/31/17 0407  WBC 11.7* 13.1*  HGB 10.4* 9.2*  HCT 35.6* 32.2*  MCV 98.1 97.6  PLT PLATELET CLUMPS NOTED ON SMEAR, COUNT APPEARS DECREASED 167   Basic Metabolic Panel: Recent Labs  Lab 01/30/17 0849 01/31/17 0407 02/01/17 0542 02/02/17 0926 02/03/17 1118  NA 132* 135 138 137 137  K 5.2* 5.5* 4.8 4.8 3.8  CL 82* 91* 90* 85* 79*  CO2 43* 37* 41* 43* 48*  GLUCOSE 78 173* 177* 144* 182*  BUN 49* 37* 34* 27* 24*  CREATININE 1.28* 1.00 0.76 0.67 0.77  CALCIUM 8.6* 8.6* 9.7 9.7 9.6  MG  --   --   --  1.9 1.8   GFR: Estimated Creatinine Clearance: 52.8 mL/min (by C-G formula based on SCr of 0.77 mg/dL). Liver Function Tests: Recent Labs  Lab 01/30/17 0849  AST 23  ALT 33  ALKPHOS 48  BILITOT 0.9  PROT 4.9*   ALBUMIN 2.7*   No results for input(s): LIPASE, AMYLASE in the last 168 hours. No results for input(s): AMMONIA in the last 168 hours. Coagulation Profile: No results for input(s): INR, PROTIME in the last 168 hours. Cardiac Enzymes: No results for input(s): CKTOTAL, CKMB, CKMBINDEX, TROPONINI in the last 168 hours. BNP (last 3 results) No results for input(s): PROBNP in the last 8760 hours. HbA1C: No results for input(s): HGBA1C in the last 72 hours. CBG: Recent Labs  Lab 01/31/17 0814 02/01/17 0802 02/02/17 0812 02/03/17 0720 02/04/17 0818  GLUCAP 164* 110* 115* 81 94   Lipid Profile: No results for input(s): CHOL, HDL, LDLCALC, TRIG, CHOLHDL, LDLDIRECT in the last 72 hours. Thyroid Function Tests: No results for input(s): TSH, T4TOTAL, FREET4, T3FREE, THYROIDAB in the last 72 hours. Anemia Panel: No results for input(s): VITAMINB12, FOLATE, FERRITIN, TIBC, IRON, RETICCTPCT in the last 72 hours. Urine analysis:    Component Value Date/Time   COLORURINE YELLOW 04/16/2016 1920   APPEARANCEUR HAZY (A) 04/16/2016 1920   LABSPEC 1.006 04/16/2016 1920   PHURINE 6.0 04/16/2016 1920   GLUCOSEU NEGATIVE 04/16/2016 1920   HGBUR SMALL (A) 04/16/2016 1920   BILIRUBINUR neg 11/24/2016 1131   KETONESUR NEGATIVE 04/16/2016 1920   PROTEINUR neg 11/24/2016 1131   PROTEINUR NEGATIVE 04/16/2016 1920   UROBILINOGEN 0.2 11/24/2016 1131   NITRITE neg 11/24/2016 1131   NITRITE POSITIVE (A) 04/16/2016 1920   LEUKOCYTESUR Small (1+) (A) 11/24/2016 1131   Sepsis Labs: @LABRCNTIP (procalcitonin:4,lacticidven:4)  ) No results found for this or any previous visit (from the past 240 hour(s)).       Radiology Studies: No results found.      Scheduled Meds: . aspirin EC  81 mg Oral Daily  . budesonide (PULMICORT) nebulizer solution  0.5 mg Nebulization BID  . busPIRone  15 mg Oral BID  . chlorhexidine  15 mL Mouth Rinse BID  . cholecalciferol  1,000 Units Oral Daily  .  enoxaparin (LOVENOX) injection  40 mg Subcutaneous Q24H  . furosemide  40 mg Oral Daily  . ipratropium  0.5 mg Nebulization TID  . levalbuterol  0.63 mg Nebulization TID  . mouth rinse  15 mL Mouth Rinse q12n4p  . metoprolol tartrate  12.5 mg Oral BID  . multivitamin with minerals  1 tablet Oral Daily  . predniSONE  60 mg Oral Q breakfast  . sodium chloride flush  3 mL Intravenous Q12H   Continuous Infusions:    LOS: 16 days    Time spent: 40 minutes    Aislyn Hayse, Roselind Messier, MD [facility] Pager [phone]   If 7PM-7AM, please contact night-coverage [email or web address] Password [facility] 02/04/2017, 8:43 AM

## 2017-02-05 DIAGNOSIS — R69 Illness, unspecified: Secondary | ICD-10-CM | POA: Diagnosis not present

## 2017-02-05 DIAGNOSIS — J9602 Acute respiratory failure with hypercapnia: Secondary | ICD-10-CM | POA: Diagnosis not present

## 2017-02-05 DIAGNOSIS — J101 Influenza due to other identified influenza virus with other respiratory manifestations: Secondary | ICD-10-CM | POA: Diagnosis not present

## 2017-02-05 DIAGNOSIS — J9621 Acute and chronic respiratory failure with hypoxia: Secondary | ICD-10-CM | POA: Diagnosis not present

## 2017-02-05 DIAGNOSIS — J441 Chronic obstructive pulmonary disease with (acute) exacerbation: Secondary | ICD-10-CM | POA: Diagnosis not present

## 2017-02-05 DIAGNOSIS — I2510 Atherosclerotic heart disease of native coronary artery without angina pectoris: Secondary | ICD-10-CM | POA: Diagnosis not present

## 2017-02-05 DIAGNOSIS — A419 Sepsis, unspecified organism: Secondary | ICD-10-CM | POA: Diagnosis not present

## 2017-02-05 DIAGNOSIS — I5032 Chronic diastolic (congestive) heart failure: Secondary | ICD-10-CM | POA: Diagnosis not present

## 2017-02-05 DIAGNOSIS — J09X1 Influenza due to identified novel influenza A virus with pneumonia: Secondary | ICD-10-CM | POA: Diagnosis not present

## 2017-02-05 DIAGNOSIS — J9601 Acute respiratory failure with hypoxia: Secondary | ICD-10-CM | POA: Diagnosis not present

## 2017-02-05 LAB — BASIC METABOLIC PANEL
Anion gap: 12 (ref 5–15)
BUN: 25 mg/dL — AB (ref 6–20)
CALCIUM: 9.3 mg/dL (ref 8.9–10.3)
CO2: 43 mmol/L — AB (ref 22–32)
Chloride: 80 mmol/L — ABNORMAL LOW (ref 101–111)
Creatinine, Ser: 0.89 mg/dL (ref 0.44–1.00)
GFR calc Af Amer: >60 mL/min (ref >60)
GFR calc non Af Amer: >60 mL/min (ref >60)
GLUCOSE: 93 mg/dL (ref 65–99)
Potassium: 3.5 mmol/L (ref 3.5–5.1)
Sodium: 135 mmol/L (ref 135–145)

## 2017-02-05 LAB — CBC
HCT: 37.1 % (ref 36.0–46.0)
HEMOGLOBIN: 11.1 g/dL — AB (ref 12.0–15.0)
MCH: 29 pg (ref 26.0–34.0)
MCHC: 29.9 g/dL — AB (ref 30.0–36.0)
MCV: 96.9 fL (ref 78.0–100.0)
Platelets: 241 10*3/uL (ref 150–400)
RBC: 3.83 MIL/uL — AB (ref 3.87–5.11)
RDW: 15.4 % (ref 11.5–15.5)
WBC: 10.1 10*3/uL (ref 4.0–10.5)

## 2017-02-05 LAB — MAGNESIUM: Magnesium: 2.1 mg/dL (ref 1.7–2.4)

## 2017-02-05 LAB — GLUCOSE, CAPILLARY: Glucose-Capillary: 85 mg/dL (ref 65–99)

## 2017-02-05 MED ORDER — FUROSEMIDE 20 MG PO TABS: 40.0000 mg | ORAL_TABLET | Freq: Every day | ORAL | 0 refills | Status: DC

## 2017-02-05 MED ORDER — METOPROLOL TARTRATE 25 MG PO TABS: 12.5000 mg | ORAL_TABLET | Freq: Two times a day (BID) | ORAL | 0 refills | Status: DC

## 2017-02-05 MED ORDER — DM-GUAIFENESIN ER 30-600 MG PO TB12: 1.0000 | ORAL_TABLET | Freq: Two times a day (BID) | ORAL | 0 refills | Status: DC | PRN

## 2017-02-05 NOTE — Progress Notes (Signed)
Reviewed discharge instructions and medications with Patient, Son and caregiver. Son stated that he didn't have any medications at home to give mother and no money to buy them. Also stated that he didn't have any blankets for patient. Notified Case manager of situation and was told we have no further services to provide patient. VS stable at this time.

## 2017-02-05 NOTE — Discharge Summary (Signed)
Physician Discharge Summary  Sheila Pierce YBO:[id number] DOB: 1945-09-29 DOA: 01/19/2017  PCP: Sheila Moore, MD  Admit date: 01/19/2017 Discharge date: 02/05/2017  Time spent: 35 minutes  Recommendations for Outpatient Follow-up:   Sepsis unspecified organism? -Patient meets criteria on admission HR> 90, RR> 20. In addition patient has lactic acidosis 2.3 -Completed course of Lovenox and Tamiflu -Out of bed to chair for all meals -Follow-up in 1-2 weeks with Dr. [name] sepsis, influenza a pneumonia, acute on chronic respiratory failure with hypoxia.    positive influenza A pneumonia -See sepsis   Acute on Chronic Respiratory Failure with Hypoxia and hypercarbia/COPD exacerbation -DuoNeb TID  -Pulmicort nebulizer - Breo-Ellipta was Discontinued secondary to patient's poor inspiratory effort (i.e. patient not obtaining sufficient amount of medication into long)  -Prednisone 60 mg daily: Titrate slowly down over the next couple of weeks. -Mucinex DM -Flutter valve -BiPAP QHS and PRN sleeping (Pt cannot refuse treatment she decompensates). If patient refuses contact on-call physician  -RN instructions ENSURE patient wears BiPAP notify on-call physician when BiPAP discontinued. -Continues to exhibit signs of hypercapnia associated chronic respiratory failure secondary to severe COPD. Patient requires this and IV both QHS and daytime to help with exacerbation.. Use of an IV will treat patient's high PCO2 levels and reduce risk of exacerbations hospitalization when used at night and during the day. Patient will need these advanced settings in conjunction with her current medication regimen; BiPAP was not an option due to his functional limitations in severity of the patient's condition. Failure to have an IV available for use over 24 hour period could lead to DEATH   Chronic Diastolic CHF (base weight?) -EKG from 01/19/2017 compared to EKG 01/01/2017 no significant change. Sinus  tachycardia -Strict in and out since admission +4.3 L -Daily weight      Filed Weights    02/03/17 0326 02/04/17 0437 02/05/17 0315  Weight: 143 lb 4.8 oz (65 kg) 139 lb 5.3 oz (63.2 kg) 139 lb 15.9 oz (63.5 kg)  -1/9 increase Lasix 40 mg daily: Maintain patient euvolemic  -Hydralazine PRN -1/9 decrease Metoprolol 12.5 mg BID (patient constantly feels fatigued allow BP to rise)   CAD  -Symptomatic -ASA 81 mg daily -See CHF   Anxiety -BuSpar 50 mg daily -Ativan PRN       Discharge Diagnoses:  Active Problems:   CAD (coronary artery disease)   Anxiety   COPD exacerbation (HCC)   Chronic diastolic CHF (congestive heart failure) (HCC)   Acute on chronic respiratory failure with hypercapnia (HCC)   COPD with acute exacerbation [facility])   Discharge Condition: Stable  Diet recommendation: Heart healthy  Filed Weights   02/03/17 0326 02/04/17 0437 02/05/17 0315  Weight: 143 lb 4.8 oz (65 kg) 139 lb 5.3 oz (63.2 kg) 139 lb 15.9 oz (63.5 kg)    History of present illness:  72 y.o. WF PMHx   Anxiety, Depression, NSTEMI, Chronic Diastolic CHF, HTN, QT prolongation, HLD, COPD, Chronic resp failure on home O2 3 L via [place],Tobacco abuse, CKD, Diabetes mellitus without complication,  Anemia, Trigeminal neuralgia   Presenting to the emergency department for increased dyspnea.  Patient reports chronic shortness of breath, but notes that this has decreased markedly in recent days point where she is now unable to speak more than 1-2 words without gasping for air.  Denies fevers or chills and denies chest pain or palpitations.   ED Course: Upon arrival to the ED, patient is found to be afebrile, saturating adequately  on 3 L/min of supplemental oxygen, tachypneic, tachycardic, and with stable blood pressure.  EKG features a sinus tachycardia with rate 110 and chest x-ray is negative for acute cardiopulmonary disease.  Chemistry panel reveals a slight hyponatremia carbonate of 35.  BUN to  creatinine ratio is elevated.  CBC is unremarkable.  ABG reveals pH 7.26, pCO2 88, pO2 102.  Patient was treated with continuous albuterol neb, 2 g IV magnesium, 125 mg IV Solu-Medrol, and started on BiPAP.  She remains hemodynamically stable, remains markedly dyspneic, and will be admitted to the stepdown unit for ongoing evaluation and management of acute exacerbation in COPD.  During his hospitalization patient was treated for influenza a pneumonia, acute on chronic respiratory failure with hypoxia and hypercapnia and COPD exacerbation. Patient's hospitalization complicated by noncompliance with medication prior to admission as well as noncompliance with medication during hospitalization. Patient treated with appropriate antibiotic/antiviral and responded well. High risk of readmission.    Cultures  12/27 MRSA by PCR negative 12/27 respiratory virus panel positive influenza A    Antibiotics Anti-infectives (From admission, onward)   Start     Stop   01/22/17 1200  levofloxacin (LEVAQUIN) tablet 750 mg  Status:  Discontinued     01/24/17 1622   01/21/17 0945  oseltamivir (TAMIFLU) capsule 30 mg     01/26/17 0959   01/20/17 1200  levofloxacin (LEVAQUIN) IVPB 750 mg  Status:  Discontinued     01/22/17 1119       Discharge Exam: Vitals:   02/05/17 0345 02/05/17 0432 02/05/17 0700 02/05/17 0726  BP:  123/60 (!) 106/56   Pulse: 88  92   Resp: 16  13   Temp:  97.8 F (36.6 C) 97.9 F (36.6 C)   TempSrc:  Axillary Oral   SpO2: 99%  96% 97%  Weight:      Height:        General: A/O 4, positive chronic respiratory distress (back to baseline), extremely deconditioned Neck:  Negative scars, masses, torticollis, lymphadenopathy, JVD Lungs: mild diffuse rhonchi, negative wheezes or crackles Cardiovascular: Regular rate and rhythm without murmur gallop or rub normal S1 and S2 Abdomen: negative abdominal pain, nondistended, positive soft, bowel sounds, no rebound, no ascites, no  appreciable mass  Discharge Instructions   Allergies as of 02/05/2017      Reactions   Penicillins Swelling, Other (See Comments)   Reaction:  Unspecified swelling reaction Has patient had a PCN reaction causing immediate rash, facial/tongue/throat swelling, SOB or lightheadedness with hypotension: Yes Has patient had a PCN reaction causing severe rash involving mucus membranes or skin necrosis: No Has patient had a PCN reaction that required hospitalization No Has patient had a PCN reaction occurring within the last 10 years: Yes If all of the above answers are "NO", then may proceed with Cephalosporin use.   Sulfa Antibiotics Hives, Other (See Comments)   Reaction:  Hallucinations   Codeine Nausea Only   Hydrocodone Nausea Only   Levaquin [levofloxacin] Itching, Swelling, Rash      Medication List    TAKE these medications   aspirin EC 81 MG tablet Take 81 mg by mouth daily.   BREO ELLIPTA 100-25 MCG/INH Aepb Generic drug:  fluticasone furoate-vilanterol Inhale 1 puff into the lungs daily.   busPIRone 15 MG tablet Commonly known as:  BUSPAR TAKE 1 TABLET BY MOUTH TWICE DAILY   cholecalciferol 1000 units tablet Commonly known as:  VITAMIN D Take 1,000 Units by mouth daily.   dextromethorphan-guaiFENesin 30-600  MG 12hr tablet Commonly known as:  MUCINEX DM Take 1 tablet by mouth 2 (two) times daily as needed for cough.   furosemide 20 MG tablet Commonly known as:  LASIX Take 2 tablets (40 mg total) by mouth daily. What changed:  how much to take   ipratropium-albuterol 0.5-2.5 (3) MG/3ML Soln Commonly known as:  DUONEB Take 3 mLs by nebulization every 4 (four) hours.   metoprolol tartrate 25 MG tablet Commonly known as:  LOPRESSOR Take 0.5 tablets (12.5 mg total) by mouth 2 (two) times daily. What changed:  how much to take   multivitamin with minerals Tabs tablet Take 1 tablet by mouth daily.   OXYGEN Inhale 3 L into the lungs continuous.   potassium  chloride 10 MEQ tablet Commonly known as:  K-DUR Take 1 tablet (10 mEq total) by mouth daily.   tiotropium 18 MCG inhalation capsule Commonly known as:  SPIRIVA Place 18 mcg into inhaler and inhale daily.   traMADol 50 MG tablet Commonly known as:  ULTRAM Take 1 tablet (50 mg total) by mouth every 6 (six) hours as needed for moderate pain or severe pain.   VENTOLIN HFA 108 (90 Base) MCG/ACT inhaler Generic drug:  albuterol INHALE 2 PUFFS EVERY FOUR HOURS AS NEEDED FOR WHEEZING OR SHORTNESS OF BREATH            Durable Medical Equipment  (From admission, onward)        Start     Ordered   01/31/17 1621  For home use only DME Bipap  Once    Comments:  Full face mask - rate of 10  Question Answer Comment  Bleed in oxygen (LPM) 40%   Keep 02 saturation 88% or >   Inspiratory pressure 10   Expiratory pressure 5      01/31/17 1621     Allergies  Allergen Reactions  . Penicillins Swelling and Other (See Comments)    Reaction:  Unspecified swelling reaction Has patient had a PCN reaction causing immediate rash, facial/tongue/throat swelling, SOB or lightheadedness with hypotension: Yes Has patient had a PCN reaction causing severe rash involving mucus membranes or skin necrosis: No Has patient had a PCN reaction that required hospitalization No Has patient had a PCN reaction occurring within the last 10 years: Yes If all of the above answers are "NO", then may proceed with Cephalosporin use.  . Sulfa Antibiotics Hives and Other (See Comments)    Reaction:  Hallucinations   . Codeine Nausea Only  . Hydrocodone Nausea Only  . Levaquin [Levofloxacin] Itching, Swelling and Rash    Contact information for follow-up providers    [facility] Follow up.   Why:  For NIV Contact information: [address] [place] [address] [phone]        [facility] Follow up.   Specialty:  Home Health Services Why:  for [facility] home health.  Contact  information: [address] [place] [address] [phone]        Sheila Moore, MD. Schedule an appointment as soon as possible for a visit in 2 week(s).   Specialty:  Family Medicine Why:  Follow-up in 1-2 weeks with Dr. [name] sepsis, influenza a pneumonia, acute on chronic respiratory failure with hypoxia. Contact information: [address] [place] [address] [phone]            Contact information for after-discharge care    Destination    [facility] Follow up.   Service:  Skilled Nursing Contact information: [address] [place] [address] [phone]                   The results of significant diagnostics from this hospitalization (including imaging, microbiology, ancillary and laboratory) are listed below for reference.    Significant Diagnostic Studies: Dg Chest 2 View  Result Date: 01/19/2017 CLINICAL DATA:  72 year old female with CHF and COPD and shortness of breath. EXAM: CHEST  2 VIEW COMPARISON:  Chest radiograph dated 01/10/2017 FINDINGS: There is emphysematous changes of the lungs. There are bibasilar linear atelectasis/ scarring. No focal consolidation, pleural effusion, or pneumothorax. The cardiac silhouette is within normal limits. There is coronary vascular calcification as well as atherosclerotic calcification of the aorta. No acute osseous pathology. IMPRESSION: No active cardiopulmonary disease. Electronically Signed   By: Elgie Collard M.D.   On: 01/19/2017 18:08   Dg Chest Port 1 View  Result Date: 02/01/2017 CLINICAL DATA:  Shortness of breath. EXAM: PORTABLE CHEST 1 VIEW COMPARISON:  01/30/2017.  01/22/2017.  01/10/2017.  10/17/2016. FINDINGS: Mediastinum and hilar structures normal. Heart size normal. COPD. Bibasilar atelectasis/infiltrates with bilateral pleural effusions. IMPRESSION: COPD. Bibasilar atelectasis and/or scarring again noted. Similar findings  noted on prior exams. Electronically Signed   By: Maisie Fus  Register   On: 02/01/2017 07:45   Dg Chest Port 1 View  Result Date: 01/30/2017 CLINICAL DATA:  COPD. EXAM: PORTABLE CHEST 1 VIEW COMPARISON:  01/22/2017. FINDINGS: Stable and normal cardiomediastinal silhouette. Thoracic atherosclerosis. Bibasilar scarring without consolidation or edema. Small BILATERAL effusions. COPD. IMPRESSION: Bibasilar scarring.  No consolidation or edema.  Stable chest. Electronically Signed   By: Elsie Stain M.D.   On: 01/30/2017 10:14   Dg Chest Port 1 View  Result Date: 01/22/2017 CLINICAL DATA:  Shortness of breath. EXAM: PORTABLE CHEST 1 VIEW COMPARISON:  Chest x-ray dated January 19, 2017. FINDINGS: The cardiomediastinal silhouette is normal in size. Normal pulmonary vascularity. Atherosclerotic calcification of the aortic arch. Stable emphysematous changes in both lungs, with bibasilar atelectasis/ scarring and coarsened interstitial markings. No focal consolidation, pleural effusion, or pneumothorax. No acute osseous abnormality. IMPRESSION: COPD.  No active cardiopulmonary disease. Electronically Signed   By: Obie Dredge M.D.   On: 01/22/2017 11:45    Microbiology: No results found for this or any previous visit (from the past 240 hour(s)).   Labs: Basic Metabolic Panel: Recent Labs  Lab 01/31/17 0407 02/01/17 0542 02/02/17 0926 02/03/17 1118 02/05/17 0830  NA 135 138 137 137 135  K 5.5* 4.8 4.8 3.8 3.5  CL 91* 90* 85* 79* 80*  CO2 37* 41* 43* 48* 43*  GLUCOSE 173* 177* 144* 182* 93  BUN 37* 34* 27* 24* 25*  CREATININE 1.00 0.76 0.67 0.77 0.89  CALCIUM 8.6* 9.7 9.7 9.6 9.3  MG  --   --  1.9 1.8 2.1   Liver Function Tests: Recent Labs  Lab 01/30/17 0849  AST 23  ALT 33  ALKPHOS 48  BILITOT 0.9  PROT 4.9*  ALBUMIN 2.7*   No results for input(s): LIPASE, AMYLASE in the last 168 hours. No results for input(s): AMMONIA in the last 168 hours. CBC: Recent Labs  Lab  01/30/17 1019 01/31/17 0407 02/05/17 0830  WBC 11.7* 13.1* 10.1  HGB 10.4* 9.2* 11.1*  HCT 35.6* 32.2* 37.1  MCV 98.1 97.6 96.9  PLT PLATELET CLUMPS NOTED ON SMEAR, COUNT APPEARS DECREASED 167 241   Cardiac Enzymes: No  results for input(s): CKTOTAL, CKMB, CKMBINDEX, TROPONINI in the last 168 hours. BNP: BNP (last 3 results) Recent Labs    05/15/16 2247 12/16/16 2153 01/01/17 1558  BNP 19.0 18.0 20.0    ProBNP (last 3 results) No results for input(s): PROBNP in the last 8760 hours.  CBG: Recent Labs  Lab 02/01/17 0802 02/02/17 0812 02/03/17 0720 02/04/17 0818 02/05/17 0759  GLUCAP 110* 115* 81 94 85       Signed:  Carolyne Littles, MD [facility] [phone] pager

## 2017-02-05 NOTE — Progress Notes (Signed)
Spoke to [facility] liaison, who states he was informed that [facility] unwilling to deliver [facility] due to home being condemned. Per report received from Letha Cape RN CM, patient and son attest to having electricity. Referral placed to [employer] who was made aware that home is condemned. They will begin insurance auth this AM and process may take 48 hours.

## 2017-02-05 NOTE — Progress Notes (Signed)
Spoke to [place], Frances Furbish who states that at LOS meeting, Frances Furbish was told to take for [facility]. Discussed with [facility]. [facility] to follow for [facility].

## 2017-02-05 NOTE — Progress Notes (Addendum)
Spoke to patient's son on the phone. He states that although the house in condemned, he has a Community education officer with [employer] to keep the electricity on due the patients's continuous oxygen demand. This was established prior to admission. Electricity is on and will not be discontinued. Patient also has running water. Arrangement made for [employer] to meet with son at 2:00 to teach how to use NIV. NIV will go home with patient.  PTAR forms printed and on chart. Spoke w RN, who knows to call [phone] when ready for transport.  Christoper Allegra will have RT eval in the home Monday or Tuesday. Will periodic follow up thereafter. Patient approved for [facility], [facility] on this week. MD placed orders, Referral given to [facility].

## 2017-02-07 ENCOUNTER — Telehealth: Payer: Self-pay

## 2017-02-07 NOTE — Telephone Encounter (Signed)
-----   Message from [name], MD sent at 02/07/2017 12:28 PM EST ----- Please call and see if she is doing OK. Specifically ask if they have medicine for her Ask if she has blankets/sufficient warmth Make sure they have electricity Inquire if [facility] nurse is coming today Ask the [facility] nurse to give [place] a call.

## 2017-02-07 NOTE — Telephone Encounter (Signed)
-----   Message from [name] Nelson, MD sent at 02/07/2017 12:28 PM EST ----- Please call and see if she is doing OK. Specifically ask if they have medicine for her Ask if she has blankets/sufficient warmth Make sure they have electricity Inquire if HH nurse is coming today Ask the HH nurse to give us a call. 

## 2017-02-07 NOTE — Telephone Encounter (Signed)
[name] returned call ie : Makaylin. He said he was on his way to pick up wendys meds today. He said she has an Lexicographer in her room so she is warm , and she does have blankets. Home health is NOT coming to see her right now as their home is condemned. He said with the government shut down she has not gotten her check, so they cannot afford to move yet. He also stated they have had someone staying with them temporarily that is " schizophrenic " nd doesn't have his medication, so he needs to call the cops to get him to leave. Lorin Picket also stated he id now wendys legal  POA.

## 2017-02-07 NOTE — Telephone Encounter (Signed)
I have called both numbers listed, no answer on either. Was able to leave message on cell #.

## 2017-02-07 NOTE — Telephone Encounter (Signed)
Left message for him to call office

## 2017-02-07 NOTE — Telephone Encounter (Signed)
Please call his social worker, and asked him to call me back to discuss this home situation.  See if you can get Sheila Pierce on the phone

## 2017-02-08 DIAGNOSIS — J449 Chronic obstructive pulmonary disease, unspecified: Secondary | ICD-10-CM | POA: Diagnosis not present

## 2017-02-08 DIAGNOSIS — J9622 Acute and chronic respiratory failure with hypercapnia: Secondary | ICD-10-CM | POA: Diagnosis not present

## 2017-02-10 DIAGNOSIS — J449 Chronic obstructive pulmonary disease, unspecified: Secondary | ICD-10-CM | POA: Diagnosis not present

## 2017-02-10 DIAGNOSIS — J9610 Chronic respiratory failure, unspecified whether with hypoxia or hypercapnia: Secondary | ICD-10-CM | POA: Diagnosis not present

## 2017-02-15 ENCOUNTER — Other Ambulatory Visit: Payer: Self-pay

## 2017-02-15 ENCOUNTER — Encounter: Payer: Self-pay | Admitting: Family Medicine

## 2017-02-15 ENCOUNTER — Ambulatory Visit (INDEPENDENT_AMBULATORY_CARE_PROVIDER_SITE_OTHER): Payer: Medicare HMO | Admitting: Family Medicine

## 2017-02-15 ENCOUNTER — Telehealth: Payer: Self-pay

## 2017-02-15 VITALS — BP 132/82 | HR 81 | Temp 98.3°F | Resp 26

## 2017-02-15 DIAGNOSIS — J9612 Chronic respiratory failure with hypercapnia: Secondary | ICD-10-CM | POA: Diagnosis not present

## 2017-02-15 DIAGNOSIS — J441 Chronic obstructive pulmonary disease with (acute) exacerbation: Secondary | ICD-10-CM

## 2017-02-15 DIAGNOSIS — D649 Anemia, unspecified: Secondary | ICD-10-CM | POA: Diagnosis not present

## 2017-02-15 DIAGNOSIS — R5381 Other malaise: Secondary | ICD-10-CM | POA: Diagnosis not present

## 2017-02-15 DIAGNOSIS — R300 Dysuria: Secondary | ICD-10-CM | POA: Diagnosis not present

## 2017-02-15 DIAGNOSIS — J9620 Acute and chronic respiratory failure, unspecified whether with hypoxia or hypercapnia: Secondary | ICD-10-CM | POA: Diagnosis not present

## 2017-02-15 DIAGNOSIS — Z79899 Other long term (current) drug therapy: Secondary | ICD-10-CM | POA: Diagnosis not present

## 2017-02-15 DIAGNOSIS — R54 Age-related physical debility: Secondary | ICD-10-CM | POA: Diagnosis not present

## 2017-02-15 DIAGNOSIS — J9600 Acute respiratory failure, unspecified whether with hypoxia or hypercapnia: Secondary | ICD-10-CM | POA: Diagnosis not present

## 2017-02-15 DIAGNOSIS — Z09 Encounter for follow-up examination after completed treatment for conditions other than malignant neoplasm: Secondary | ICD-10-CM | POA: Diagnosis not present

## 2017-02-15 DIAGNOSIS — R627 Adult failure to thrive: Secondary | ICD-10-CM | POA: Diagnosis not present

## 2017-02-15 DIAGNOSIS — T7491XA Unspecified adult maltreatment, confirmed, initial encounter: Secondary | ICD-10-CM | POA: Insufficient documentation

## 2017-02-15 DIAGNOSIS — J9610 Chronic respiratory failure, unspecified whether with hypoxia or hypercapnia: Secondary | ICD-10-CM | POA: Diagnosis not present

## 2017-02-15 DIAGNOSIS — Z7951 Long term (current) use of inhaled steroids: Secondary | ICD-10-CM | POA: Diagnosis not present

## 2017-02-15 DIAGNOSIS — R69 Illness, unspecified: Secondary | ICD-10-CM | POA: Diagnosis not present

## 2017-02-15 DIAGNOSIS — R000 Tachycardia, unspecified: Secondary | ICD-10-CM | POA: Diagnosis not present

## 2017-02-15 DIAGNOSIS — J449 Chronic obstructive pulmonary disease, unspecified: Secondary | ICD-10-CM | POA: Diagnosis not present

## 2017-02-15 DIAGNOSIS — N390 Urinary tract infection, site not specified: Secondary | ICD-10-CM | POA: Diagnosis not present

## 2017-02-15 DIAGNOSIS — R0602 Shortness of breath: Secondary | ICD-10-CM | POA: Diagnosis not present

## 2017-02-15 DIAGNOSIS — Z608 Other problems related to social environment: Secondary | ICD-10-CM | POA: Diagnosis not present

## 2017-02-15 DIAGNOSIS — R0689 Other abnormalities of breathing: Secondary | ICD-10-CM | POA: Diagnosis not present

## 2017-02-15 DIAGNOSIS — Z7982 Long term (current) use of aspirin: Secondary | ICD-10-CM | POA: Diagnosis not present

## 2017-02-15 DIAGNOSIS — I110 Hypertensive heart disease with heart failure: Secondary | ICD-10-CM | POA: Diagnosis not present

## 2017-02-15 DIAGNOSIS — B852 Pediculosis, unspecified: Secondary | ICD-10-CM | POA: Diagnosis not present

## 2017-02-15 DIAGNOSIS — I509 Heart failure, unspecified: Secondary | ICD-10-CM | POA: Diagnosis not present

## 2017-02-15 DIAGNOSIS — J9611 Chronic respiratory failure with hypoxia: Secondary | ICD-10-CM | POA: Diagnosis not present

## 2017-02-15 DIAGNOSIS — Z9981 Dependence on supplemental oxygen: Secondary | ICD-10-CM | POA: Diagnosis not present

## 2017-02-15 NOTE — Patient Instructions (Signed)
Go to ER
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
## 2017-02-15 NOTE — Telephone Encounter (Signed)
Faxed hospital discharge summary from 02/05/17 to [facility] per Dr Delton See. Pt is going to ED for hospital admission.  Faxed to [phone]

## 2017-02-16 ENCOUNTER — Encounter: Payer: Self-pay | Admitting: *Deleted

## 2017-02-16 DIAGNOSIS — R69 Illness, unspecified: Secondary | ICD-10-CM | POA: Diagnosis not present

## 2017-02-16 DIAGNOSIS — R54 Age-related physical debility: Secondary | ICD-10-CM | POA: Diagnosis not present

## 2017-02-16 DIAGNOSIS — Z608 Other problems related to social environment: Secondary | ICD-10-CM | POA: Diagnosis not present

## 2017-02-16 DIAGNOSIS — J441 Chronic obstructive pulmonary disease with (acute) exacerbation: Secondary | ICD-10-CM | POA: Diagnosis not present

## 2017-02-16 NOTE — Telephone Encounter (Signed)
This encounter was created in error - please disregard.

## 2017-02-23 DIAGNOSIS — Z5321 Procedure and treatment not carried out due to patient leaving prior to being seen by health care provider: Secondary | ICD-10-CM | POA: Diagnosis not present

## 2017-02-23 DIAGNOSIS — I110 Hypertensive heart disease with heart failure: Secondary | ICD-10-CM | POA: Diagnosis not present

## 2017-02-23 DIAGNOSIS — R0602 Shortness of breath: Secondary | ICD-10-CM | POA: Diagnosis not present

## 2017-02-23 DIAGNOSIS — Z7982 Long term (current) use of aspirin: Secondary | ICD-10-CM | POA: Diagnosis not present

## 2017-02-23 DIAGNOSIS — R0682 Tachypnea, not elsewhere classified: Secondary | ICD-10-CM | POA: Diagnosis not present

## 2017-02-23 DIAGNOSIS — I509 Heart failure, unspecified: Secondary | ICD-10-CM | POA: Diagnosis not present

## 2017-02-23 DIAGNOSIS — Z79899 Other long term (current) drug therapy: Secondary | ICD-10-CM | POA: Diagnosis not present

## 2017-02-23 DIAGNOSIS — B852 Pediculosis, unspecified: Secondary | ICD-10-CM | POA: Diagnosis not present

## 2017-02-23 DIAGNOSIS — R69 Illness, unspecified: Secondary | ICD-10-CM | POA: Diagnosis not present

## 2017-02-23 DIAGNOSIS — Z9981 Dependence on supplemental oxygen: Secondary | ICD-10-CM | POA: Diagnosis not present

## 2017-02-23 DIAGNOSIS — Z87891 Personal history of nicotine dependence: Secondary | ICD-10-CM | POA: Diagnosis not present

## 2017-02-23 DIAGNOSIS — R5381 Other malaise: Secondary | ICD-10-CM | POA: Diagnosis not present

## 2017-02-23 DIAGNOSIS — J441 Chronic obstructive pulmonary disease with (acute) exacerbation: Secondary | ICD-10-CM | POA: Diagnosis not present

## 2017-02-24 ENCOUNTER — Telehealth: Payer: Self-pay | Admitting: Family Medicine

## 2017-02-24 DIAGNOSIS — J441 Chronic obstructive pulmonary disease with (acute) exacerbation: Secondary | ICD-10-CM | POA: Diagnosis not present

## 2017-02-24 DIAGNOSIS — B852 Pediculosis, unspecified: Secondary | ICD-10-CM | POA: Diagnosis not present

## 2017-02-24 DIAGNOSIS — R5381 Other malaise: Secondary | ICD-10-CM | POA: Diagnosis not present

## 2017-02-24 DIAGNOSIS — R69 Illness, unspecified: Secondary | ICD-10-CM | POA: Diagnosis not present

## 2017-02-24 DIAGNOSIS — I509 Heart failure, unspecified: Secondary | ICD-10-CM | POA: Diagnosis not present

## 2017-02-24 NOTE — Telephone Encounter (Signed)
Before I call the pharmacy, do you know what medications the patient is to be taking?

## 2017-02-24 NOTE — Telephone Encounter (Signed)
Patients son is requesting for Dr.Nelson to contact [facility] to discuss patients medications with a pharmacist. ""They will not fill any medications until they discuss medications with pcp because she has so many medications from so many different places.""  She is scheduled for a transitional appt on 03/04/17 at 1pm

## 2017-02-24 NOTE — Telephone Encounter (Signed)
We will notify the pharmacy as soon as we get the records from [facility]

## 2017-02-26 DIAGNOSIS — J449 Chronic obstructive pulmonary disease, unspecified: Secondary | ICD-10-CM | POA: Diagnosis not present

## 2017-02-26 DIAGNOSIS — J9612 Chronic respiratory failure with hypercapnia: Secondary | ICD-10-CM | POA: Diagnosis not present

## 2017-02-26 DIAGNOSIS — J181 Lobar pneumonia, unspecified organism: Secondary | ICD-10-CM | POA: Diagnosis not present

## 2017-02-26 DIAGNOSIS — R300 Dysuria: Secondary | ICD-10-CM | POA: Diagnosis not present

## 2017-02-26 DIAGNOSIS — D649 Anemia, unspecified: Secondary | ICD-10-CM | POA: Diagnosis not present

## 2017-02-26 DIAGNOSIS — R0682 Tachypnea, not elsewhere classified: Secondary | ICD-10-CM | POA: Diagnosis not present

## 2017-02-26 DIAGNOSIS — I110 Hypertensive heart disease with heart failure: Secondary | ICD-10-CM | POA: Diagnosis not present

## 2017-02-26 DIAGNOSIS — R32 Unspecified urinary incontinence: Secondary | ICD-10-CM | POA: Diagnosis not present

## 2017-02-26 DIAGNOSIS — I5032 Chronic diastolic (congestive) heart failure: Secondary | ICD-10-CM | POA: Diagnosis not present

## 2017-02-26 DIAGNOSIS — Z87891 Personal history of nicotine dependence: Secondary | ICD-10-CM | POA: Diagnosis not present

## 2017-02-26 DIAGNOSIS — R627 Adult failure to thrive: Secondary | ICD-10-CM | POA: Diagnosis not present

## 2017-02-26 DIAGNOSIS — J9620 Acute and chronic respiratory failure, unspecified whether with hypoxia or hypercapnia: Secondary | ICD-10-CM | POA: Diagnosis not present

## 2017-02-26 DIAGNOSIS — J441 Chronic obstructive pulmonary disease with (acute) exacerbation: Secondary | ICD-10-CM | POA: Diagnosis not present

## 2017-02-26 DIAGNOSIS — R000 Tachycardia, unspecified: Secondary | ICD-10-CM | POA: Diagnosis not present

## 2017-02-26 DIAGNOSIS — B9620 Unspecified Escherichia coli [E. coli] as the cause of diseases classified elsewhere: Secondary | ICD-10-CM | POA: Diagnosis not present

## 2017-02-26 DIAGNOSIS — Z6825 Body mass index (BMI) 25.0-25.9, adult: Secondary | ICD-10-CM | POA: Diagnosis not present

## 2017-02-26 DIAGNOSIS — I10 Essential (primary) hypertension: Secondary | ICD-10-CM | POA: Diagnosis not present

## 2017-02-26 DIAGNOSIS — E440 Moderate protein-calorie malnutrition: Secondary | ICD-10-CM | POA: Diagnosis not present

## 2017-02-26 DIAGNOSIS — J438 Other emphysema: Secondary | ICD-10-CM | POA: Diagnosis not present

## 2017-02-26 DIAGNOSIS — L89152 Pressure ulcer of sacral region, stage 2: Secondary | ICD-10-CM | POA: Diagnosis not present

## 2017-02-26 DIAGNOSIS — N3001 Acute cystitis with hematuria: Secondary | ICD-10-CM | POA: Diagnosis not present

## 2017-02-26 DIAGNOSIS — M818 Other osteoporosis without current pathological fracture: Secondary | ICD-10-CM | POA: Diagnosis not present

## 2017-02-26 DIAGNOSIS — J440 Chronic obstructive pulmonary disease with acute lower respiratory infection: Secondary | ICD-10-CM | POA: Diagnosis not present

## 2017-02-26 DIAGNOSIS — R69 Illness, unspecified: Secondary | ICD-10-CM | POA: Diagnosis not present

## 2017-02-26 DIAGNOSIS — N390 Urinary tract infection, site not specified: Secondary | ICD-10-CM | POA: Diagnosis not present

## 2017-02-26 DIAGNOSIS — J9600 Acute respiratory failure, unspecified whether with hypoxia or hypercapnia: Secondary | ICD-10-CM | POA: Diagnosis not present

## 2017-02-27 DIAGNOSIS — J449 Chronic obstructive pulmonary disease, unspecified: Secondary | ICD-10-CM | POA: Diagnosis not present

## 2017-02-27 DIAGNOSIS — M818 Other osteoporosis without current pathological fracture: Secondary | ICD-10-CM | POA: Diagnosis not present

## 2017-02-27 DIAGNOSIS — R32 Unspecified urinary incontinence: Secondary | ICD-10-CM | POA: Diagnosis not present

## 2017-02-27 DIAGNOSIS — J441 Chronic obstructive pulmonary disease with (acute) exacerbation: Secondary | ICD-10-CM | POA: Diagnosis not present

## 2017-02-27 DIAGNOSIS — J9600 Acute respiratory failure, unspecified whether with hypoxia or hypercapnia: Secondary | ICD-10-CM | POA: Diagnosis not present

## 2017-02-27 DIAGNOSIS — R300 Dysuria: Secondary | ICD-10-CM | POA: Diagnosis not present

## 2017-02-27 DIAGNOSIS — J438 Other emphysema: Secondary | ICD-10-CM | POA: Diagnosis not present

## 2017-02-28 DIAGNOSIS — J449 Chronic obstructive pulmonary disease, unspecified: Secondary | ICD-10-CM | POA: Diagnosis not present

## 2017-02-28 DIAGNOSIS — I110 Hypertensive heart disease with heart failure: Secondary | ICD-10-CM | POA: Diagnosis not present

## 2017-02-28 DIAGNOSIS — I5032 Chronic diastolic (congestive) heart failure: Secondary | ICD-10-CM | POA: Diagnosis not present

## 2017-02-28 DIAGNOSIS — N390 Urinary tract infection, site not specified: Secondary | ICD-10-CM | POA: Diagnosis not present

## 2017-03-01 ENCOUNTER — Encounter: Payer: Self-pay | Admitting: Family Medicine

## 2017-03-01 DIAGNOSIS — N390 Urinary tract infection, site not specified: Secondary | ICD-10-CM | POA: Diagnosis not present

## 2017-03-01 DIAGNOSIS — I110 Hypertensive heart disease with heart failure: Secondary | ICD-10-CM | POA: Diagnosis not present

## 2017-03-01 DIAGNOSIS — B9620 Unspecified Escherichia coli [E. coli] as the cause of diseases classified elsewhere: Secondary | ICD-10-CM | POA: Diagnosis not present

## 2017-03-01 DIAGNOSIS — R69 Illness, unspecified: Secondary | ICD-10-CM | POA: Diagnosis not present

## 2017-03-01 DIAGNOSIS — J449 Chronic obstructive pulmonary disease, unspecified: Secondary | ICD-10-CM | POA: Diagnosis not present

## 2017-03-02 DIAGNOSIS — N390 Urinary tract infection, site not specified: Secondary | ICD-10-CM | POA: Diagnosis not present

## 2017-03-02 DIAGNOSIS — J9620 Acute and chronic respiratory failure, unspecified whether with hypoxia or hypercapnia: Secondary | ICD-10-CM | POA: Diagnosis not present

## 2017-03-02 DIAGNOSIS — R627 Adult failure to thrive: Secondary | ICD-10-CM | POA: Diagnosis not present

## 2017-03-02 DIAGNOSIS — J449 Chronic obstructive pulmonary disease, unspecified: Secondary | ICD-10-CM | POA: Diagnosis not present

## 2017-03-02 DIAGNOSIS — R69 Illness, unspecified: Secondary | ICD-10-CM | POA: Diagnosis not present

## 2017-03-02 DIAGNOSIS — R000 Tachycardia, unspecified: Secondary | ICD-10-CM | POA: Diagnosis not present

## 2017-03-03 DIAGNOSIS — M818 Other osteoporosis without current pathological fracture: Secondary | ICD-10-CM | POA: Diagnosis not present

## 2017-03-03 DIAGNOSIS — J441 Chronic obstructive pulmonary disease with (acute) exacerbation: Secondary | ICD-10-CM | POA: Diagnosis not present

## 2017-03-03 DIAGNOSIS — E440 Moderate protein-calorie malnutrition: Secondary | ICD-10-CM | POA: Diagnosis not present

## 2017-03-03 DIAGNOSIS — L89152 Pressure ulcer of sacral region, stage 2: Secondary | ICD-10-CM | POA: Diagnosis not present

## 2017-03-03 DIAGNOSIS — J440 Chronic obstructive pulmonary disease with acute lower respiratory infection: Secondary | ICD-10-CM | POA: Diagnosis not present

## 2017-03-03 DIAGNOSIS — R32 Unspecified urinary incontinence: Secondary | ICD-10-CM | POA: Diagnosis not present

## 2017-03-03 DIAGNOSIS — J449 Chronic obstructive pulmonary disease, unspecified: Secondary | ICD-10-CM | POA: Diagnosis not present

## 2017-03-03 DIAGNOSIS — J438 Other emphysema: Secondary | ICD-10-CM | POA: Diagnosis not present

## 2017-03-03 NOTE — Telephone Encounter (Signed)
I have faxed [facility] twice requesting d/c summary.

## 2017-03-03 NOTE — Telephone Encounter (Signed)
Medications per d/c summary are Aspirin 81 mg daily Buspar 15 mg BID Metoprolol 25 mg daily Breo 25/100 mcg daily Albuterol neb solution as directed Lasix 40 mg daily Metoprolol 12.5 mg bid Mucinex bid prn Mutlivitamin Potassium chloride 10 meq daily Albuterol pro air 1 puff prn Spiriva 18 mcg daily Tramadol 50 mg daily Albuterol 2 puffs inhalation q4h Vitamin D   Please advise.

## 2017-03-04 ENCOUNTER — Encounter: Payer: Self-pay | Admitting: Family Medicine

## 2017-03-04 DIAGNOSIS — R627 Adult failure to thrive: Secondary | ICD-10-CM | POA: Diagnosis not present

## 2017-03-04 DIAGNOSIS — R69 Illness, unspecified: Secondary | ICD-10-CM | POA: Diagnosis not present

## 2017-03-04 DIAGNOSIS — R000 Tachycardia, unspecified: Secondary | ICD-10-CM | POA: Diagnosis not present

## 2017-03-04 DIAGNOSIS — J449 Chronic obstructive pulmonary disease, unspecified: Secondary | ICD-10-CM | POA: Diagnosis not present

## 2017-03-04 DIAGNOSIS — J9600 Acute respiratory failure, unspecified whether with hypoxia or hypercapnia: Secondary | ICD-10-CM | POA: Diagnosis not present

## 2017-03-04 DIAGNOSIS — N390 Urinary tract infection, site not specified: Secondary | ICD-10-CM | POA: Diagnosis not present

## 2017-03-04 DIAGNOSIS — D649 Anemia, unspecified: Secondary | ICD-10-CM | POA: Diagnosis not present

## 2017-03-09 ENCOUNTER — Telehealth: Payer: Self-pay | Admitting: Family Medicine

## 2017-03-09 NOTE — Telephone Encounter (Signed)
Patients son called in to request a new hospital bed mattress for patient. States they are in the process of moving and their home is infested with bed bugs.

## 2017-03-11 DIAGNOSIS — J9622 Acute and chronic respiratory failure with hypercapnia: Secondary | ICD-10-CM | POA: Diagnosis not present

## 2017-03-11 DIAGNOSIS — J449 Chronic obstructive pulmonary disease, unspecified: Secondary | ICD-10-CM | POA: Diagnosis not present

## 2017-03-13 DIAGNOSIS — J9622 Acute and chronic respiratory failure with hypercapnia: Secondary | ICD-10-CM | POA: Diagnosis not present

## 2017-03-13 DIAGNOSIS — J9621 Acute and chronic respiratory failure with hypoxia: Secondary | ICD-10-CM | POA: Diagnosis not present

## 2017-03-13 DIAGNOSIS — Z6825 Body mass index (BMI) 25.0-25.9, adult: Secondary | ICD-10-CM | POA: Diagnosis not present

## 2017-03-13 DIAGNOSIS — D649 Anemia, unspecified: Secondary | ICD-10-CM | POA: Diagnosis not present

## 2017-03-13 DIAGNOSIS — I10 Essential (primary) hypertension: Secondary | ICD-10-CM | POA: Diagnosis not present

## 2017-03-13 DIAGNOSIS — J449 Chronic obstructive pulmonary disease, unspecified: Secondary | ICD-10-CM | POA: Diagnosis not present

## 2017-03-13 DIAGNOSIS — R0602 Shortness of breath: Secondary | ICD-10-CM | POA: Diagnosis not present

## 2017-03-13 DIAGNOSIS — J441 Chronic obstructive pulmonary disease with (acute) exacerbation: Secondary | ICD-10-CM | POA: Diagnosis not present

## 2017-03-13 DIAGNOSIS — Z9981 Dependence on supplemental oxygen: Secondary | ICD-10-CM | POA: Diagnosis not present

## 2017-03-13 DIAGNOSIS — J9612 Chronic respiratory failure with hypercapnia: Secondary | ICD-10-CM | POA: Diagnosis not present

## 2017-03-13 DIAGNOSIS — J9611 Chronic respiratory failure with hypoxia: Secondary | ICD-10-CM | POA: Diagnosis not present

## 2017-03-13 DIAGNOSIS — J9610 Chronic respiratory failure, unspecified whether with hypoxia or hypercapnia: Secondary | ICD-10-CM | POA: Diagnosis not present

## 2017-03-13 DIAGNOSIS — Z87891 Personal history of nicotine dependence: Secondary | ICD-10-CM | POA: Diagnosis not present

## 2017-03-13 DIAGNOSIS — R000 Tachycardia, unspecified: Secondary | ICD-10-CM | POA: Diagnosis not present

## 2017-03-13 DIAGNOSIS — R0682 Tachypnea, not elsewhere classified: Secondary | ICD-10-CM | POA: Diagnosis not present

## 2017-03-14 DIAGNOSIS — Z7951 Long term (current) use of inhaled steroids: Secondary | ICD-10-CM | POA: Diagnosis not present

## 2017-03-14 DIAGNOSIS — J9610 Chronic respiratory failure, unspecified whether with hypoxia or hypercapnia: Secondary | ICD-10-CM | POA: Diagnosis not present

## 2017-03-14 DIAGNOSIS — R54 Age-related physical debility: Secondary | ICD-10-CM | POA: Diagnosis not present

## 2017-03-14 DIAGNOSIS — Z9981 Dependence on supplemental oxygen: Secondary | ICD-10-CM | POA: Diagnosis not present

## 2017-03-14 DIAGNOSIS — J441 Chronic obstructive pulmonary disease with (acute) exacerbation: Secondary | ICD-10-CM | POA: Diagnosis not present

## 2017-03-14 DIAGNOSIS — R000 Tachycardia, unspecified: Secondary | ICD-10-CM | POA: Diagnosis not present

## 2017-03-14 DIAGNOSIS — Z6825 Body mass index (BMI) 25.0-25.9, adult: Secondary | ICD-10-CM | POA: Diagnosis not present

## 2017-03-14 DIAGNOSIS — Z7952 Long term (current) use of systemic steroids: Secondary | ICD-10-CM | POA: Diagnosis not present

## 2017-03-14 DIAGNOSIS — Z87891 Personal history of nicotine dependence: Secondary | ICD-10-CM | POA: Diagnosis not present

## 2017-03-14 DIAGNOSIS — R0600 Dyspnea, unspecified: Secondary | ICD-10-CM | POA: Diagnosis not present

## 2017-03-14 DIAGNOSIS — J9611 Chronic respiratory failure with hypoxia: Secondary | ICD-10-CM | POA: Diagnosis not present

## 2017-03-14 DIAGNOSIS — J9612 Chronic respiratory failure with hypercapnia: Secondary | ICD-10-CM | POA: Diagnosis not present

## 2017-03-14 DIAGNOSIS — R0602 Shortness of breath: Secondary | ICD-10-CM | POA: Diagnosis not present

## 2017-03-14 DIAGNOSIS — Z79899 Other long term (current) drug therapy: Secondary | ICD-10-CM | POA: Diagnosis not present

## 2017-03-14 DIAGNOSIS — J449 Chronic obstructive pulmonary disease, unspecified: Secondary | ICD-10-CM | POA: Diagnosis not present

## 2017-03-14 DIAGNOSIS — D649 Anemia, unspecified: Secondary | ICD-10-CM | POA: Diagnosis not present

## 2017-03-14 DIAGNOSIS — I10 Essential (primary) hypertension: Secondary | ICD-10-CM | POA: Diagnosis not present

## 2017-03-14 DIAGNOSIS — Z7982 Long term (current) use of aspirin: Secondary | ICD-10-CM | POA: Diagnosis not present

## 2017-03-15 ENCOUNTER — Telehealth: Payer: Self-pay | Admitting: Family Medicine

## 2017-03-15 ENCOUNTER — Other Ambulatory Visit: Payer: Self-pay

## 2017-03-15 DIAGNOSIS — J9612 Chronic respiratory failure with hypercapnia: Secondary | ICD-10-CM | POA: Diagnosis not present

## 2017-03-15 DIAGNOSIS — J9611 Chronic respiratory failure with hypoxia: Secondary | ICD-10-CM | POA: Diagnosis not present

## 2017-03-15 MED ORDER — UNABLE TO FIND: 0 refills | Status: DC

## 2017-03-15 NOTE — Telephone Encounter (Signed)
Sam, [facility], left message on nurse line stating she received a prescription for a mattress, but they do not do those.  Callback# [phone]

## 2017-03-16 ENCOUNTER — Telehealth: Payer: Self-pay | Admitting: Family Medicine

## 2017-03-16 NOTE — Telephone Encounter (Signed)
Sheila Pierce that she is following up on your conversation at the end of Jan-with Dr Delton See. Sheila Pierce is with [facility] # [phone]

## 2017-03-16 NOTE — Telephone Encounter (Signed)
I have spoken to [place] and mary beth with [facility]. They state Sheila Pierce is back in the hospital [facility]- with resp. Distress. They are concerned about sending there staff into her home d/t past issues.

## 2017-03-16 NOTE — Telephone Encounter (Signed)
I would request that they make one home visit and assess for themselves whether there is risk for their staff

## 2017-03-17 DIAGNOSIS — Z7982 Long term (current) use of aspirin: Secondary | ICD-10-CM | POA: Diagnosis not present

## 2017-03-17 DIAGNOSIS — Z7952 Long term (current) use of systemic steroids: Secondary | ICD-10-CM | POA: Diagnosis not present

## 2017-03-17 DIAGNOSIS — Z7951 Long term (current) use of inhaled steroids: Secondary | ICD-10-CM | POA: Diagnosis not present

## 2017-03-17 DIAGNOSIS — Z87891 Personal history of nicotine dependence: Secondary | ICD-10-CM | POA: Diagnosis not present

## 2017-03-17 DIAGNOSIS — J449 Chronic obstructive pulmonary disease, unspecified: Secondary | ICD-10-CM | POA: Diagnosis not present

## 2017-03-17 DIAGNOSIS — J069 Acute upper respiratory infection, unspecified: Secondary | ICD-10-CM | POA: Diagnosis not present

## 2017-03-17 DIAGNOSIS — J9611 Chronic respiratory failure with hypoxia: Secondary | ICD-10-CM | POA: Diagnosis not present

## 2017-03-17 DIAGNOSIS — N186 End stage renal disease: Secondary | ICD-10-CM | POA: Diagnosis not present

## 2017-03-17 DIAGNOSIS — R0602 Shortness of breath: Secondary | ICD-10-CM | POA: Diagnosis not present

## 2017-03-17 DIAGNOSIS — R0600 Dyspnea, unspecified: Secondary | ICD-10-CM | POA: Diagnosis not present

## 2017-03-17 DIAGNOSIS — I10 Essential (primary) hypertension: Secondary | ICD-10-CM | POA: Diagnosis not present

## 2017-03-17 DIAGNOSIS — Z79899 Other long term (current) drug therapy: Secondary | ICD-10-CM | POA: Diagnosis not present

## 2017-03-17 DIAGNOSIS — N390 Urinary tract infection, site not specified: Secondary | ICD-10-CM | POA: Diagnosis not present

## 2017-03-17 DIAGNOSIS — J441 Chronic obstructive pulmonary disease with (acute) exacerbation: Secondary | ICD-10-CM | POA: Diagnosis not present

## 2017-03-17 DIAGNOSIS — J9612 Chronic respiratory failure with hypercapnia: Secondary | ICD-10-CM | POA: Diagnosis not present

## 2017-03-18 ENCOUNTER — Encounter: Payer: Self-pay | Admitting: Family Medicine

## 2017-03-18 DIAGNOSIS — N186 End stage renal disease: Secondary | ICD-10-CM | POA: Diagnosis not present

## 2017-03-18 DIAGNOSIS — J069 Acute upper respiratory infection, unspecified: Secondary | ICD-10-CM | POA: Diagnosis not present

## 2017-03-18 DIAGNOSIS — J9621 Acute and chronic respiratory failure with hypoxia: Secondary | ICD-10-CM | POA: Diagnosis not present

## 2017-03-18 DIAGNOSIS — I10 Essential (primary) hypertension: Secondary | ICD-10-CM | POA: Diagnosis not present

## 2017-03-18 DIAGNOSIS — J9622 Acute and chronic respiratory failure with hypercapnia: Secondary | ICD-10-CM | POA: Diagnosis not present

## 2017-03-18 DIAGNOSIS — J9612 Chronic respiratory failure with hypercapnia: Secondary | ICD-10-CM | POA: Diagnosis not present

## 2017-03-18 DIAGNOSIS — Z6825 Body mass index (BMI) 25.0-25.9, adult: Secondary | ICD-10-CM | POA: Diagnosis not present

## 2017-03-18 DIAGNOSIS — I110 Hypertensive heart disease with heart failure: Secondary | ICD-10-CM | POA: Diagnosis not present

## 2017-03-18 DIAGNOSIS — J9611 Chronic respiratory failure with hypoxia: Secondary | ICD-10-CM | POA: Diagnosis not present

## 2017-03-18 DIAGNOSIS — R0602 Shortness of breath: Secondary | ICD-10-CM | POA: Diagnosis not present

## 2017-03-18 DIAGNOSIS — R69 Illness, unspecified: Secondary | ICD-10-CM | POA: Diagnosis not present

## 2017-03-18 DIAGNOSIS — N390 Urinary tract infection, site not specified: Secondary | ICD-10-CM | POA: Diagnosis not present

## 2017-03-18 DIAGNOSIS — R54 Age-related physical debility: Secondary | ICD-10-CM | POA: Diagnosis not present

## 2017-03-18 DIAGNOSIS — D638 Anemia in other chronic diseases classified elsewhere: Secondary | ICD-10-CM | POA: Diagnosis not present

## 2017-03-18 DIAGNOSIS — J441 Chronic obstructive pulmonary disease with (acute) exacerbation: Secondary | ICD-10-CM | POA: Diagnosis not present

## 2017-03-18 DIAGNOSIS — R627 Adult failure to thrive: Secondary | ICD-10-CM | POA: Diagnosis not present

## 2017-03-18 DIAGNOSIS — I5030 Unspecified diastolic (congestive) heart failure: Secondary | ICD-10-CM | POA: Diagnosis not present

## 2017-03-19 DIAGNOSIS — J9620 Acute and chronic respiratory failure, unspecified whether with hypoxia or hypercapnia: Secondary | ICD-10-CM | POA: Diagnosis not present

## 2017-03-19 DIAGNOSIS — J441 Chronic obstructive pulmonary disease with (acute) exacerbation: Secondary | ICD-10-CM | POA: Diagnosis not present

## 2017-03-19 DIAGNOSIS — I509 Heart failure, unspecified: Secondary | ICD-10-CM | POA: Diagnosis not present

## 2017-03-20 DIAGNOSIS — I509 Heart failure, unspecified: Secondary | ICD-10-CM | POA: Diagnosis not present

## 2017-03-20 DIAGNOSIS — J441 Chronic obstructive pulmonary disease with (acute) exacerbation: Secondary | ICD-10-CM | POA: Diagnosis not present

## 2017-03-20 DIAGNOSIS — J9621 Acute and chronic respiratory failure with hypoxia: Secondary | ICD-10-CM | POA: Diagnosis not present

## 2017-03-20 DIAGNOSIS — J9622 Acute and chronic respiratory failure with hypercapnia: Secondary | ICD-10-CM | POA: Diagnosis not present

## 2017-03-21 DIAGNOSIS — J9611 Chronic respiratory failure with hypoxia: Secondary | ICD-10-CM | POA: Diagnosis not present

## 2017-03-21 DIAGNOSIS — J9612 Chronic respiratory failure with hypercapnia: Secondary | ICD-10-CM | POA: Diagnosis not present

## 2017-03-22 DIAGNOSIS — R69 Illness, unspecified: Secondary | ICD-10-CM | POA: Diagnosis not present

## 2017-03-22 DIAGNOSIS — I10 Essential (primary) hypertension: Secondary | ICD-10-CM | POA: Diagnosis not present

## 2017-03-22 DIAGNOSIS — J9620 Acute and chronic respiratory failure, unspecified whether with hypoxia or hypercapnia: Secondary | ICD-10-CM | POA: Diagnosis not present

## 2017-03-23 DIAGNOSIS — I110 Hypertensive heart disease with heart failure: Secondary | ICD-10-CM | POA: Diagnosis not present

## 2017-03-23 DIAGNOSIS — R627 Adult failure to thrive: Secondary | ICD-10-CM | POA: Diagnosis not present

## 2017-03-23 DIAGNOSIS — J9620 Acute and chronic respiratory failure, unspecified whether with hypoxia or hypercapnia: Secondary | ICD-10-CM | POA: Diagnosis not present

## 2017-03-23 DIAGNOSIS — R279 Unspecified lack of coordination: Secondary | ICD-10-CM | POA: Diagnosis not present

## 2017-03-23 DIAGNOSIS — J441 Chronic obstructive pulmonary disease with (acute) exacerbation: Secondary | ICD-10-CM | POA: Diagnosis not present

## 2017-03-23 DIAGNOSIS — Z7401 Bed confinement status: Secondary | ICD-10-CM | POA: Diagnosis not present

## 2017-03-23 DIAGNOSIS — R1311 Dysphagia, oral phase: Secondary | ICD-10-CM | POA: Diagnosis not present

## 2017-03-23 DIAGNOSIS — I5030 Unspecified diastolic (congestive) heart failure: Secondary | ICD-10-CM | POA: Diagnosis not present

## 2017-03-23 DIAGNOSIS — R69 Illness, unspecified: Secondary | ICD-10-CM | POA: Diagnosis not present

## 2017-03-23 NOTE — Telephone Encounter (Signed)
Left message for Sheila Pierce to call office.

## 2017-03-24 DIAGNOSIS — J9620 Acute and chronic respiratory failure, unspecified whether with hypoxia or hypercapnia: Secondary | ICD-10-CM | POA: Diagnosis not present

## 2017-03-25 DIAGNOSIS — J9620 Acute and chronic respiratory failure, unspecified whether with hypoxia or hypercapnia: Secondary | ICD-10-CM | POA: Diagnosis not present

## 2017-03-27 DIAGNOSIS — J449 Chronic obstructive pulmonary disease, unspecified: Secondary | ICD-10-CM | POA: Diagnosis not present

## 2017-03-27 DIAGNOSIS — M818 Other osteoporosis without current pathological fracture: Secondary | ICD-10-CM | POA: Diagnosis not present

## 2017-03-27 DIAGNOSIS — J441 Chronic obstructive pulmonary disease with (acute) exacerbation: Secondary | ICD-10-CM | POA: Diagnosis not present

## 2017-03-27 DIAGNOSIS — J438 Other emphysema: Secondary | ICD-10-CM | POA: Diagnosis not present

## 2017-03-27 DIAGNOSIS — R32 Unspecified urinary incontinence: Secondary | ICD-10-CM | POA: Diagnosis not present

## 2017-03-28 DIAGNOSIS — R69 Illness, unspecified: Secondary | ICD-10-CM | POA: Diagnosis not present

## 2017-03-28 DIAGNOSIS — J441 Chronic obstructive pulmonary disease with (acute) exacerbation: Secondary | ICD-10-CM | POA: Diagnosis not present

## 2017-03-28 DIAGNOSIS — I5030 Unspecified diastolic (congestive) heart failure: Secondary | ICD-10-CM | POA: Diagnosis not present

## 2017-03-28 DIAGNOSIS — J9620 Acute and chronic respiratory failure, unspecified whether with hypoxia or hypercapnia: Secondary | ICD-10-CM | POA: Diagnosis not present

## 2017-03-28 DIAGNOSIS — M6281 Muscle weakness (generalized): Secondary | ICD-10-CM | POA: Diagnosis not present

## 2017-03-29 DIAGNOSIS — J9620 Acute and chronic respiratory failure, unspecified whether with hypoxia or hypercapnia: Secondary | ICD-10-CM | POA: Diagnosis not present

## 2017-03-29 DIAGNOSIS — M6281 Muscle weakness (generalized): Secondary | ICD-10-CM | POA: Diagnosis not present

## 2017-03-29 DIAGNOSIS — R69 Illness, unspecified: Secondary | ICD-10-CM | POA: Diagnosis not present

## 2017-03-29 DIAGNOSIS — I5030 Unspecified diastolic (congestive) heart failure: Secondary | ICD-10-CM | POA: Diagnosis not present

## 2017-03-30 DIAGNOSIS — J9620 Acute and chronic respiratory failure, unspecified whether with hypoxia or hypercapnia: Secondary | ICD-10-CM | POA: Diagnosis not present

## 2017-03-31 DIAGNOSIS — R32 Unspecified urinary incontinence: Secondary | ICD-10-CM | POA: Diagnosis not present

## 2017-03-31 DIAGNOSIS — J9620 Acute and chronic respiratory failure, unspecified whether with hypoxia or hypercapnia: Secondary | ICD-10-CM | POA: Diagnosis not present

## 2017-03-31 DIAGNOSIS — J441 Chronic obstructive pulmonary disease with (acute) exacerbation: Secondary | ICD-10-CM | POA: Diagnosis not present

## 2017-03-31 DIAGNOSIS — J438 Other emphysema: Secondary | ICD-10-CM | POA: Diagnosis not present

## 2017-03-31 DIAGNOSIS — M818 Other osteoporosis without current pathological fracture: Secondary | ICD-10-CM | POA: Diagnosis not present

## 2017-03-31 DIAGNOSIS — J449 Chronic obstructive pulmonary disease, unspecified: Secondary | ICD-10-CM | POA: Diagnosis not present

## 2017-03-31 DIAGNOSIS — R05 Cough: Secondary | ICD-10-CM | POA: Diagnosis not present

## 2017-03-31 DIAGNOSIS — E440 Moderate protein-calorie malnutrition: Secondary | ICD-10-CM | POA: Diagnosis not present

## 2017-03-31 DIAGNOSIS — J440 Chronic obstructive pulmonary disease with acute lower respiratory infection: Secondary | ICD-10-CM | POA: Diagnosis not present

## 2017-03-31 DIAGNOSIS — L89152 Pressure ulcer of sacral region, stage 2: Secondary | ICD-10-CM | POA: Diagnosis not present

## 2017-04-01 DIAGNOSIS — J9620 Acute and chronic respiratory failure, unspecified whether with hypoxia or hypercapnia: Secondary | ICD-10-CM | POA: Diagnosis not present

## 2017-04-05 DIAGNOSIS — J9620 Acute and chronic respiratory failure, unspecified whether with hypoxia or hypercapnia: Secondary | ICD-10-CM | POA: Diagnosis not present

## 2017-04-06 DIAGNOSIS — J9620 Acute and chronic respiratory failure, unspecified whether with hypoxia or hypercapnia: Secondary | ICD-10-CM | POA: Diagnosis not present

## 2017-04-07 DIAGNOSIS — M79651 Pain in right thigh: Secondary | ICD-10-CM | POA: Diagnosis not present

## 2017-04-07 DIAGNOSIS — M79621 Pain in right upper arm: Secondary | ICD-10-CM | POA: Diagnosis not present

## 2017-04-07 DIAGNOSIS — W19XXXA Unspecified fall, initial encounter: Secondary | ICD-10-CM | POA: Diagnosis not present

## 2017-04-07 DIAGNOSIS — M6281 Muscle weakness (generalized): Secondary | ICD-10-CM | POA: Diagnosis not present

## 2017-04-07 DIAGNOSIS — M79661 Pain in right lower leg: Secondary | ICD-10-CM | POA: Diagnosis not present

## 2017-04-07 DIAGNOSIS — M79631 Pain in right forearm: Secondary | ICD-10-CM | POA: Diagnosis not present

## 2017-04-07 DIAGNOSIS — M79601 Pain in right arm: Secondary | ICD-10-CM | POA: Diagnosis not present

## 2017-04-08 DIAGNOSIS — J9622 Acute and chronic respiratory failure with hypercapnia: Secondary | ICD-10-CM | POA: Diagnosis not present

## 2017-04-08 DIAGNOSIS — J449 Chronic obstructive pulmonary disease, unspecified: Secondary | ICD-10-CM | POA: Diagnosis not present

## 2017-04-08 DIAGNOSIS — J9620 Acute and chronic respiratory failure, unspecified whether with hypoxia or hypercapnia: Secondary | ICD-10-CM | POA: Diagnosis not present

## 2017-04-10 DIAGNOSIS — J9610 Chronic respiratory failure, unspecified whether with hypoxia or hypercapnia: Secondary | ICD-10-CM | POA: Diagnosis not present

## 2017-04-10 DIAGNOSIS — J449 Chronic obstructive pulmonary disease, unspecified: Secondary | ICD-10-CM | POA: Diagnosis not present

## 2017-04-11 DIAGNOSIS — Z79899 Other long term (current) drug therapy: Secondary | ICD-10-CM | POA: Diagnosis not present

## 2017-04-11 DIAGNOSIS — J9620 Acute and chronic respiratory failure, unspecified whether with hypoxia or hypercapnia: Secondary | ICD-10-CM | POA: Diagnosis not present

## 2017-04-12 DIAGNOSIS — J9620 Acute and chronic respiratory failure, unspecified whether with hypoxia or hypercapnia: Secondary | ICD-10-CM | POA: Diagnosis not present

## 2017-04-13 DIAGNOSIS — J9620 Acute and chronic respiratory failure, unspecified whether with hypoxia or hypercapnia: Secondary | ICD-10-CM | POA: Diagnosis not present

## 2017-04-15 DIAGNOSIS — F3342 Major depressive disorder, recurrent, in full remission: Secondary | ICD-10-CM | POA: Diagnosis not present

## 2017-04-15 DIAGNOSIS — R69 Illness, unspecified: Secondary | ICD-10-CM | POA: Diagnosis not present

## 2017-04-18 DIAGNOSIS — R69 Illness, unspecified: Secondary | ICD-10-CM | POA: Diagnosis not present

## 2017-04-27 DIAGNOSIS — J438 Other emphysema: Secondary | ICD-10-CM | POA: Diagnosis not present

## 2017-04-27 DIAGNOSIS — J441 Chronic obstructive pulmonary disease with (acute) exacerbation: Secondary | ICD-10-CM | POA: Diagnosis not present

## 2017-04-27 DIAGNOSIS — M818 Other osteoporosis without current pathological fracture: Secondary | ICD-10-CM | POA: Diagnosis not present

## 2017-04-27 DIAGNOSIS — R32 Unspecified urinary incontinence: Secondary | ICD-10-CM | POA: Diagnosis not present

## 2017-04-27 DIAGNOSIS — J449 Chronic obstructive pulmonary disease, unspecified: Secondary | ICD-10-CM | POA: Diagnosis not present

## 2017-04-29 DIAGNOSIS — R69 Illness, unspecified: Secondary | ICD-10-CM | POA: Diagnosis not present

## 2017-04-29 DIAGNOSIS — F3342 Major depressive disorder, recurrent, in full remission: Secondary | ICD-10-CM | POA: Diagnosis not present

## 2017-05-01 DIAGNOSIS — J441 Chronic obstructive pulmonary disease with (acute) exacerbation: Secondary | ICD-10-CM | POA: Diagnosis not present

## 2017-05-01 DIAGNOSIS — J438 Other emphysema: Secondary | ICD-10-CM | POA: Diagnosis not present

## 2017-05-01 DIAGNOSIS — J449 Chronic obstructive pulmonary disease, unspecified: Secondary | ICD-10-CM | POA: Diagnosis not present

## 2017-05-01 DIAGNOSIS — L89152 Pressure ulcer of sacral region, stage 2: Secondary | ICD-10-CM | POA: Diagnosis not present

## 2017-05-01 DIAGNOSIS — J440 Chronic obstructive pulmonary disease with acute lower respiratory infection: Secondary | ICD-10-CM | POA: Diagnosis not present

## 2017-05-01 DIAGNOSIS — M818 Other osteoporosis without current pathological fracture: Secondary | ICD-10-CM | POA: Diagnosis not present

## 2017-05-01 DIAGNOSIS — R32 Unspecified urinary incontinence: Secondary | ICD-10-CM | POA: Diagnosis not present

## 2017-05-01 DIAGNOSIS — E440 Moderate protein-calorie malnutrition: Secondary | ICD-10-CM | POA: Diagnosis not present

## 2017-05-09 DIAGNOSIS — J449 Chronic obstructive pulmonary disease, unspecified: Secondary | ICD-10-CM | POA: Diagnosis not present

## 2017-05-09 DIAGNOSIS — J9622 Acute and chronic respiratory failure with hypercapnia: Secondary | ICD-10-CM | POA: Diagnosis not present

## 2017-05-11 DIAGNOSIS — J449 Chronic obstructive pulmonary disease, unspecified: Secondary | ICD-10-CM | POA: Diagnosis not present

## 2017-05-11 DIAGNOSIS — J9610 Chronic respiratory failure, unspecified whether with hypoxia or hypercapnia: Secondary | ICD-10-CM | POA: Diagnosis not present

## 2017-05-18 ENCOUNTER — Ambulatory Visit: Payer: Medicare HMO | Admitting: "Endocrinology

## 2017-05-19 ENCOUNTER — Encounter: Payer: Self-pay | Admitting: "Endocrinology

## 2017-05-19 DIAGNOSIS — D638 Anemia in other chronic diseases classified elsewhere: Secondary | ICD-10-CM | POA: Diagnosis not present

## 2017-05-19 DIAGNOSIS — I10 Essential (primary) hypertension: Secondary | ICD-10-CM | POA: Diagnosis not present

## 2017-05-19 DIAGNOSIS — R69 Illness, unspecified: Secondary | ICD-10-CM | POA: Diagnosis not present

## 2017-05-27 DIAGNOSIS — J441 Chronic obstructive pulmonary disease with (acute) exacerbation: Secondary | ICD-10-CM | POA: Diagnosis not present

## 2017-05-27 DIAGNOSIS — J438 Other emphysema: Secondary | ICD-10-CM | POA: Diagnosis not present

## 2017-05-27 DIAGNOSIS — R69 Illness, unspecified: Secondary | ICD-10-CM | POA: Diagnosis not present

## 2017-05-27 DIAGNOSIS — J449 Chronic obstructive pulmonary disease, unspecified: Secondary | ICD-10-CM | POA: Diagnosis not present

## 2017-05-27 DIAGNOSIS — M818 Other osteoporosis without current pathological fracture: Secondary | ICD-10-CM | POA: Diagnosis not present

## 2017-05-27 DIAGNOSIS — F3342 Major depressive disorder, recurrent, in full remission: Secondary | ICD-10-CM | POA: Diagnosis not present

## 2017-05-27 DIAGNOSIS — R32 Unspecified urinary incontinence: Secondary | ICD-10-CM | POA: Diagnosis not present

## 2017-05-31 DIAGNOSIS — L89152 Pressure ulcer of sacral region, stage 2: Secondary | ICD-10-CM | POA: Diagnosis not present

## 2017-05-31 DIAGNOSIS — J441 Chronic obstructive pulmonary disease with (acute) exacerbation: Secondary | ICD-10-CM | POA: Diagnosis not present

## 2017-05-31 DIAGNOSIS — J438 Other emphysema: Secondary | ICD-10-CM | POA: Diagnosis not present

## 2017-05-31 DIAGNOSIS — R32 Unspecified urinary incontinence: Secondary | ICD-10-CM | POA: Diagnosis not present

## 2017-05-31 DIAGNOSIS — M818 Other osteoporosis without current pathological fracture: Secondary | ICD-10-CM | POA: Diagnosis not present

## 2017-05-31 DIAGNOSIS — E440 Moderate protein-calorie malnutrition: Secondary | ICD-10-CM | POA: Diagnosis not present

## 2017-05-31 DIAGNOSIS — J449 Chronic obstructive pulmonary disease, unspecified: Secondary | ICD-10-CM | POA: Diagnosis not present

## 2017-05-31 DIAGNOSIS — J440 Chronic obstructive pulmonary disease with acute lower respiratory infection: Secondary | ICD-10-CM | POA: Diagnosis not present

## 2017-06-08 DIAGNOSIS — J449 Chronic obstructive pulmonary disease, unspecified: Secondary | ICD-10-CM | POA: Diagnosis not present

## 2017-06-08 DIAGNOSIS — J9622 Acute and chronic respiratory failure with hypercapnia: Secondary | ICD-10-CM | POA: Diagnosis not present

## 2017-06-09 DIAGNOSIS — J441 Chronic obstructive pulmonary disease with (acute) exacerbation: Secondary | ICD-10-CM | POA: Diagnosis not present

## 2017-06-09 DIAGNOSIS — M6281 Muscle weakness (generalized): Secondary | ICD-10-CM | POA: Diagnosis not present

## 2017-06-09 DIAGNOSIS — I10 Essential (primary) hypertension: Secondary | ICD-10-CM | POA: Diagnosis not present

## 2017-06-09 DIAGNOSIS — J9620 Acute and chronic respiratory failure, unspecified whether with hypoxia or hypercapnia: Secondary | ICD-10-CM | POA: Diagnosis not present

## 2017-06-10 DIAGNOSIS — J9610 Chronic respiratory failure, unspecified whether with hypoxia or hypercapnia: Secondary | ICD-10-CM | POA: Diagnosis not present

## 2017-06-10 DIAGNOSIS — J449 Chronic obstructive pulmonary disease, unspecified: Secondary | ICD-10-CM | POA: Diagnosis not present

## 2017-06-24 DIAGNOSIS — R69 Illness, unspecified: Secondary | ICD-10-CM | POA: Diagnosis not present

## 2017-06-24 DIAGNOSIS — F3342 Major depressive disorder, recurrent, in full remission: Secondary | ICD-10-CM | POA: Diagnosis not present

## 2017-06-24 DIAGNOSIS — F5105 Insomnia due to other mental disorder: Secondary | ICD-10-CM | POA: Diagnosis not present

## 2017-06-27 DIAGNOSIS — I10 Essential (primary) hypertension: Secondary | ICD-10-CM | POA: Diagnosis not present

## 2017-06-27 DIAGNOSIS — R69 Illness, unspecified: Secondary | ICD-10-CM | POA: Diagnosis not present

## 2017-06-27 DIAGNOSIS — D638 Anemia in other chronic diseases classified elsewhere: Secondary | ICD-10-CM | POA: Diagnosis not present

## 2017-07-01 DIAGNOSIS — M818 Other osteoporosis without current pathological fracture: Secondary | ICD-10-CM | POA: Diagnosis not present

## 2017-07-01 DIAGNOSIS — J441 Chronic obstructive pulmonary disease with (acute) exacerbation: Secondary | ICD-10-CM | POA: Diagnosis not present

## 2017-07-01 DIAGNOSIS — R32 Unspecified urinary incontinence: Secondary | ICD-10-CM | POA: Diagnosis not present

## 2017-07-01 DIAGNOSIS — J449 Chronic obstructive pulmonary disease, unspecified: Secondary | ICD-10-CM | POA: Diagnosis not present

## 2017-07-01 DIAGNOSIS — L89152 Pressure ulcer of sacral region, stage 2: Secondary | ICD-10-CM | POA: Diagnosis not present

## 2017-07-01 DIAGNOSIS — J440 Chronic obstructive pulmonary disease with acute lower respiratory infection: Secondary | ICD-10-CM | POA: Diagnosis not present

## 2017-07-01 DIAGNOSIS — J438 Other emphysema: Secondary | ICD-10-CM | POA: Diagnosis not present

## 2017-07-01 DIAGNOSIS — E440 Moderate protein-calorie malnutrition: Secondary | ICD-10-CM | POA: Diagnosis not present

## 2017-07-05 ENCOUNTER — Other Ambulatory Visit: Payer: Self-pay

## 2017-07-05 NOTE — Patient Outreach (Signed)
[facility]) Care Management  07/05/2017  Sheila Pierce 09-29-1945 [id number]  Telephone Screen Referral Date : 07/04/2017 Referral Source: [facility] ED Census Referral Reason: Ed utilization Insurance:[employer]  Outreach attempt # 1 To patient.   1st unsuccessful outreach attempt. No answer.  HIPAA compliant voicemail left with contact information.  Plan: RN Health Coach will send letter. RN Health Coach will make another attempt to the patient within four business days.  [name] RN, BSN, [facility] RN Health Coach Disease Management [facility] Dial:  [phone] Fax: [phone]

## 2017-07-08 ENCOUNTER — Other Ambulatory Visit: Payer: Self-pay

## 2017-07-08 NOTE — Patient Outreach (Signed)
[facility]) Care Management  07/08/2017  Sheila Pierce 1945/11/14 [id number]   2nd attempt to outreach the patient for screening.  No answer.  HIPAA compliant voicemail left with contact information.  Plan: RN Health Coach will make another outreach attempt to the patient within four business days.  [name] RN, BSN, [facility] RN Health Coach Disease Management [facility] Dial:  [phone]  Fax: [phone]

## 2017-07-09 DIAGNOSIS — J449 Chronic obstructive pulmonary disease, unspecified: Secondary | ICD-10-CM | POA: Diagnosis not present

## 2017-07-09 DIAGNOSIS — J9622 Acute and chronic respiratory failure with hypercapnia: Secondary | ICD-10-CM | POA: Diagnosis not present

## 2017-07-11 DIAGNOSIS — J9610 Chronic respiratory failure, unspecified whether with hypoxia or hypercapnia: Secondary | ICD-10-CM | POA: Diagnosis not present

## 2017-07-11 DIAGNOSIS — J449 Chronic obstructive pulmonary disease, unspecified: Secondary | ICD-10-CM | POA: Diagnosis not present

## 2017-07-13 ENCOUNTER — Other Ambulatory Visit: Payer: Self-pay

## 2017-07-13 NOTE — Patient Outreach (Signed)
[facility]) Care Management  07/13/2017  MINAMI ANCELET [birth year] [id number]   3rd outreach attempt to the patient.  No answer. HIPAA compliant voicemail left with contact information.  Plan: If no response to calls and letter in ten business days. Rn health Coach will proceed with case closure.   [name] RN, BSN, [facility] RN Health Coach Disease Management [facility] Dial:  [phone]  Fax: [phone]

## 2017-07-15 DIAGNOSIS — F3342 Major depressive disorder, recurrent, in full remission: Secondary | ICD-10-CM | POA: Diagnosis not present

## 2017-07-15 DIAGNOSIS — R69 Illness, unspecified: Secondary | ICD-10-CM | POA: Diagnosis not present

## 2017-07-15 DIAGNOSIS — F5105 Insomnia due to other mental disorder: Secondary | ICD-10-CM | POA: Diagnosis not present

## 2017-07-26 ENCOUNTER — Encounter (HOSPITAL_COMMUNITY): Payer: Self-pay | Admitting: Emergency Medicine

## 2017-07-26 ENCOUNTER — Other Ambulatory Visit: Payer: Self-pay

## 2017-07-26 ENCOUNTER — Inpatient Hospital Stay (HOSPITAL_COMMUNITY)
Admission: EM | Admit: 2017-07-26 | Discharge: 2017-07-30 | DRG: 871 | Disposition: A | Discharge status: Skilled Nursing Facility | Payer: Medicare HMO | Attending: Internal Medicine | Admitting: Internal Medicine

## 2017-07-26 ENCOUNTER — Emergency Department (HOSPITAL_COMMUNITY): Payer: Medicare HMO

## 2017-07-26 DIAGNOSIS — Z880 Allergy status to penicillin: Secondary | ICD-10-CM | POA: Diagnosis not present

## 2017-07-26 DIAGNOSIS — I252 Old myocardial infarction: Secondary | ICD-10-CM | POA: Diagnosis not present

## 2017-07-26 DIAGNOSIS — J189 Pneumonia, unspecified organism: Secondary | ICD-10-CM | POA: Diagnosis not present

## 2017-07-26 DIAGNOSIS — J439 Emphysema, unspecified: Secondary | ICD-10-CM | POA: Diagnosis not present

## 2017-07-26 DIAGNOSIS — Z7982 Long term (current) use of aspirin: Secondary | ICD-10-CM

## 2017-07-26 DIAGNOSIS — Z7189 Other specified counseling: Secondary | ICD-10-CM

## 2017-07-26 DIAGNOSIS — Z8249 Family history of ischemic heart disease and other diseases of the circulatory system: Secondary | ICD-10-CM | POA: Diagnosis not present

## 2017-07-26 DIAGNOSIS — J440 Chronic obstructive pulmonary disease with acute lower respiratory infection: Secondary | ICD-10-CM | POA: Diagnosis present

## 2017-07-26 DIAGNOSIS — M810 Age-related osteoporosis without current pathological fracture: Secondary | ICD-10-CM | POA: Diagnosis present

## 2017-07-26 DIAGNOSIS — J9621 Acute and chronic respiratory failure with hypoxia: Secondary | ICD-10-CM | POA: Diagnosis not present

## 2017-07-26 DIAGNOSIS — J9690 Respiratory failure, unspecified, unspecified whether with hypoxia or hypercapnia: Secondary | ICD-10-CM | POA: Diagnosis not present

## 2017-07-26 DIAGNOSIS — I10 Essential (primary) hypertension: Secondary | ICD-10-CM | POA: Diagnosis present

## 2017-07-26 DIAGNOSIS — R6520 Severe sepsis without septic shock: Secondary | ICD-10-CM | POA: Diagnosis not present

## 2017-07-26 DIAGNOSIS — Z818 Family history of other mental and behavioral disorders: Secondary | ICD-10-CM | POA: Diagnosis not present

## 2017-07-26 DIAGNOSIS — R0682 Tachypnea, not elsewhere classified: Secondary | ICD-10-CM | POA: Diagnosis not present

## 2017-07-26 DIAGNOSIS — F329 Major depressive disorder, single episode, unspecified: Secondary | ICD-10-CM | POA: Diagnosis present

## 2017-07-26 DIAGNOSIS — Z87891 Personal history of nicotine dependence: Secondary | ICD-10-CM

## 2017-07-26 DIAGNOSIS — J449 Chronic obstructive pulmonary disease, unspecified: Secondary | ICD-10-CM | POA: Diagnosis not present

## 2017-07-26 DIAGNOSIS — Z79899 Other long term (current) drug therapy: Secondary | ICD-10-CM | POA: Diagnosis not present

## 2017-07-26 DIAGNOSIS — R0602 Shortness of breath: Secondary | ICD-10-CM | POA: Diagnosis not present

## 2017-07-26 DIAGNOSIS — K219 Gastro-esophageal reflux disease without esophagitis: Secondary | ICD-10-CM | POA: Diagnosis present

## 2017-07-26 DIAGNOSIS — Z66 Do not resuscitate: Secondary | ICD-10-CM | POA: Diagnosis present

## 2017-07-26 DIAGNOSIS — H9190 Unspecified hearing loss, unspecified ear: Secondary | ICD-10-CM | POA: Diagnosis present

## 2017-07-26 DIAGNOSIS — Z885 Allergy status to narcotic agent status: Secondary | ICD-10-CM

## 2017-07-26 DIAGNOSIS — J9622 Acute and chronic respiratory failure with hypercapnia: Secondary | ICD-10-CM | POA: Diagnosis present

## 2017-07-26 DIAGNOSIS — N179 Acute kidney failure, unspecified: Secondary | ICD-10-CM | POA: Diagnosis present

## 2017-07-26 DIAGNOSIS — N183 Chronic kidney disease, stage 3 (moderate): Secondary | ICD-10-CM | POA: Diagnosis present

## 2017-07-26 DIAGNOSIS — J441 Chronic obstructive pulmonary disease with (acute) exacerbation: Secondary | ICD-10-CM | POA: Diagnosis present

## 2017-07-26 DIAGNOSIS — Z888 Allergy status to other drugs, medicaments and biological substances status: Secondary | ICD-10-CM

## 2017-07-26 DIAGNOSIS — R279 Unspecified lack of coordination: Secondary | ICD-10-CM | POA: Diagnosis not present

## 2017-07-26 DIAGNOSIS — J9602 Acute respiratory failure with hypercapnia: Secondary | ICD-10-CM

## 2017-07-26 DIAGNOSIS — I130 Hypertensive heart and chronic kidney disease with heart failure and stage 1 through stage 4 chronic kidney disease, or unspecified chronic kidney disease: Secondary | ICD-10-CM | POA: Diagnosis present

## 2017-07-26 DIAGNOSIS — Z7951 Long term (current) use of inhaled steroids: Secondary | ICD-10-CM

## 2017-07-26 DIAGNOSIS — Z515 Encounter for palliative care: Secondary | ICD-10-CM | POA: Diagnosis present

## 2017-07-26 DIAGNOSIS — A419 Sepsis, unspecified organism: Secondary | ICD-10-CM | POA: Diagnosis not present

## 2017-07-26 DIAGNOSIS — J9601 Acute respiratory failure with hypoxia: Secondary | ICD-10-CM | POA: Diagnosis not present

## 2017-07-26 DIAGNOSIS — R000 Tachycardia, unspecified: Secondary | ICD-10-CM | POA: Diagnosis not present

## 2017-07-26 DIAGNOSIS — J9620 Acute and chronic respiratory failure, unspecified whether with hypoxia or hypercapnia: Secondary | ICD-10-CM | POA: Diagnosis not present

## 2017-07-26 DIAGNOSIS — R1311 Dysphagia, oral phase: Secondary | ICD-10-CM | POA: Diagnosis not present

## 2017-07-26 DIAGNOSIS — R0603 Acute respiratory distress: Secondary | ICD-10-CM | POA: Diagnosis not present

## 2017-07-26 DIAGNOSIS — Y95 Nosocomial condition: Secondary | ICD-10-CM | POA: Diagnosis present

## 2017-07-26 DIAGNOSIS — Z7401 Bed confinement status: Secondary | ICD-10-CM | POA: Diagnosis not present

## 2017-07-26 DIAGNOSIS — I5032 Chronic diastolic (congestive) heart failure: Secondary | ICD-10-CM | POA: Diagnosis present

## 2017-07-26 DIAGNOSIS — Z882 Allergy status to sulfonamides status: Secondary | ICD-10-CM

## 2017-07-26 DIAGNOSIS — Z9981 Dependence on supplemental oxygen: Secondary | ICD-10-CM | POA: Diagnosis not present

## 2017-07-26 DIAGNOSIS — J9600 Acute respiratory failure, unspecified whether with hypoxia or hypercapnia: Secondary | ICD-10-CM | POA: Diagnosis present

## 2017-07-26 DIAGNOSIS — E119 Type 2 diabetes mellitus without complications: Secondary | ICD-10-CM

## 2017-07-26 LAB — CBC WITH DIFFERENTIAL/PLATELET
BASOS ABS: 0 10*3/uL (ref 0.0–0.1)
BASOS PCT: 0 %
EOS ABS: 0 10*3/uL (ref 0.0–0.7)
Eosinophils Relative: 0 %
HEMATOCRIT: 41.2 % (ref 36.0–46.0)
HEMOGLOBIN: 13 g/dL (ref 12.0–15.0)
Lymphocytes Relative: 13 %
Lymphs Abs: 2.8 10*3/uL (ref 0.7–4.0)
MCH: 27.6 pg (ref 26.0–34.0)
MCHC: 31.6 g/dL (ref 30.0–36.0)
MCV: 87.5 fL (ref 78.0–100.0)
MONOS PCT: 6 %
Monocytes Absolute: 1.4 10*3/uL — ABNORMAL HIGH (ref 0.1–1.0)
Neutro Abs: 17.8 10*3/uL — ABNORMAL HIGH (ref 1.7–7.7)
Neutrophils Relative %: 81 %
Platelets: 338 10*3/uL (ref 150–400)
RBC: 4.71 MIL/uL (ref 3.87–5.11)
RDW: 14.8 % (ref 11.5–15.5)
WBC: 21.9 10*3/uL — ABNORMAL HIGH (ref 4.0–10.5)

## 2017-07-26 LAB — BLOOD GAS, ARTERIAL
Acid-Base Excess: 9.5 mmol/L — ABNORMAL HIGH (ref 0.0–2.0)
BICARBONATE: 31.6 mmol/L — AB (ref 20.0–28.0)
Delivery systems: POSITIVE
Drawn by: 28459
Expiratory PAP: 6
FIO2: 100
Inspiratory PAP: 14
O2 SAT: 99.1 %
PATIENT TEMPERATURE: 40
PH ART: 7.353 (ref 7.350–7.450)
PO2 ART: 195 mmHg — AB (ref 83.0–108.0)
pCO2 arterial: 65 mmHg — ABNORMAL HIGH (ref 32.0–48.0)

## 2017-07-26 LAB — COMPREHENSIVE METABOLIC PANEL
ALBUMIN: 3.8 g/dL (ref 3.5–5.0)
ALK PHOS: 74 U/L (ref 38–126)
ALT: 26 U/L (ref 0–44)
ANION GAP: 13 (ref 5–15)
AST: 31 U/L (ref 15–41)
BILIRUBIN TOTAL: 1.2 mg/dL (ref 0.3–1.2)
BUN: 26 mg/dL — AB (ref 8–23)
CALCIUM: 9.7 mg/dL (ref 8.9–10.3)
CO2: 34 mmol/L — ABNORMAL HIGH (ref 22–32)
Chloride: 88 mmol/L — ABNORMAL LOW (ref 98–111)
Creatinine, Ser: 1.82 mg/dL — ABNORMAL HIGH (ref 0.44–1.00)
GFR calc Af Amer: 31 mL/min — ABNORMAL LOW (ref >60)
GFR calc non Af Amer: 27 mL/min — ABNORMAL LOW (ref >60)
GLUCOSE: 188 mg/dL — AB (ref 70–99)
Potassium: 4.7 mmol/L (ref 3.5–5.1)
Sodium: 135 mmol/L (ref 135–145)
Total Protein: 8.7 g/dL — ABNORMAL HIGH (ref 6.5–8.1)

## 2017-07-26 LAB — URINALYSIS, MICROSCOPIC (REFLEX)
Bacteria, UA: NONE SEEN
SQUAMOUS EPITHELIAL / LPF: NONE SEEN (ref 0–5)
WBC UA: NONE SEEN WBC/hpf (ref 0–5)

## 2017-07-26 LAB — URINALYSIS, ROUTINE W REFLEX MICROSCOPIC
Bilirubin Urine: NEGATIVE
Glucose, UA: NEGATIVE mg/dL
LEUKOCYTES UA: NEGATIVE
NITRITE: NEGATIVE
PH: 6 (ref 5.0–8.0)
PROTEIN: 30 mg/dL — AB
Specific Gravity, Urine: 1.015 (ref 1.005–1.030)

## 2017-07-26 LAB — MRSA PCR SCREENING: MRSA BY PCR: POSITIVE — AB

## 2017-07-26 LAB — LACTIC ACID, PLASMA: LACTIC ACID, VENOUS: 0.9 mmol/L (ref 0.5–1.9)

## 2017-07-26 LAB — GLUCOSE, CAPILLARY: Glucose-Capillary: 173 mg/dL — ABNORMAL HIGH (ref 70–99)

## 2017-07-26 LAB — HEMOGLOBIN A1C
HEMOGLOBIN A1C: 5.5 % (ref 4.8–5.6)
Mean Plasma Glucose: 111.15 mg/dL

## 2017-07-26 LAB — CBG MONITORING, ED: GLUCOSE-CAPILLARY: 206 mg/dL — AB (ref 70–99)

## 2017-07-26 LAB — I-STAT CG4 LACTIC ACID, ED: Lactic Acid, Venous: 1.61 mmol/L (ref 0.5–1.9)

## 2017-07-26 MED ORDER — IPRATROPIUM-ALBUTEROL 0.5-2.5 (3) MG/3ML IN SOLN
3.0000 mL | Freq: Four times a day (QID) | RESPIRATORY_TRACT | Status: DC
  Administered 2017-07-26 – 2017-07-29 (×13): 3 mL via RESPIRATORY_TRACT
  Filled 2017-07-26 (×13): qty 3

## 2017-07-26 MED ORDER — ORAL CARE MOUTH RINSE
15.0000 mL | Freq: Two times a day (BID) | OROMUCOSAL | Status: DC
  Administered 2017-07-26 – 2017-07-30 (×8): 15 mL via OROMUCOSAL

## 2017-07-26 MED ORDER — ENOXAPARIN SODIUM 30 MG/0.3ML ~~LOC~~ SOLN: 30.0000 mg | Freq: Every day | SUBCUTANEOUS | Status: DC

## 2017-07-26 MED ORDER — VANCOMYCIN HCL IN DEXTROSE 1-5 GM/200ML-% IV SOLN
1000.0000 mg | Freq: Once | INTRAVENOUS | Status: AC
Start: 2017-07-26 — End: 2017-07-26
  Administered 2017-07-26: 1000 mg via INTRAVENOUS
  Filled 2017-07-26: qty 200

## 2017-07-26 MED ORDER — ASPIRIN EC 81 MG PO TBEC
81.0000 mg | DELAYED_RELEASE_TABLET | Freq: Every day | ORAL | Status: DC
  Administered 2017-07-26 – 2017-07-30 (×5): 81 mg via ORAL
  Filled 2017-07-26 (×5): qty 1

## 2017-07-26 MED ORDER — CHLORHEXIDINE GLUCONATE CLOTH 2 % EX PADS
6.0000 | MEDICATED_PAD | Freq: Every day | CUTANEOUS | Status: AC
  Administered 2017-07-26 – 2017-07-30 (×5): 6 via TOPICAL

## 2017-07-26 MED ORDER — SODIUM CHLORIDE 0.9 % IV SOLN
INTRAVENOUS | Status: AC
  Administered 2017-07-26 – 2017-07-27 (×2): via INTRAVENOUS

## 2017-07-26 MED ORDER — METOPROLOL TARTRATE 25 MG PO TABS
12.5000 mg | ORAL_TABLET | Freq: Two times a day (BID) | ORAL | Status: DC
  Administered 2017-07-26 – 2017-07-30 (×9): 12.5 mg via ORAL
  Filled 2017-07-26 (×9): qty 1

## 2017-07-26 MED ORDER — VANCOMYCIN HCL IN DEXTROSE 750-5 MG/150ML-% IV SOLN
750.0000 mg | INTRAVENOUS | Status: DC
  Administered 2017-07-27 – 2017-07-29 (×3): 750 mg via INTRAVENOUS
  Filled 2017-07-26 (×6): qty 150

## 2017-07-26 MED ORDER — ACETAMINOPHEN 650 MG RE SUPP
650.0000 mg | Freq: Once | RECTAL | Status: AC
  Administered 2017-07-26: 650 mg via RECTAL
  Filled 2017-07-26: qty 1

## 2017-07-26 MED ORDER — SODIUM CHLORIDE 0.9 % IV BOLUS (SEPSIS)
1000.0000 mL | Freq: Once | INTRAVENOUS | Status: AC
  Administered 2017-07-26: 1000 mL via INTRAVENOUS

## 2017-07-26 MED ORDER — AZTREONAM 2 G IJ SOLR
2.0000 g | Freq: Once | INTRAMUSCULAR | Status: AC
  Administered 2017-07-26: 2 g via INTRAVENOUS
  Filled 2017-07-26: qty 2

## 2017-07-26 MED ORDER — MUPIROCIN 2 % EX OINT
1.0000 "application " | TOPICAL_OINTMENT | Freq: Two times a day (BID) | CUTANEOUS | Status: DC
  Administered 2017-07-26 – 2017-07-30 (×9): 1 via NASAL
  Filled 2017-07-26 (×3): qty 22

## 2017-07-26 MED ORDER — BUSPIRONE HCL 5 MG PO TABS: 15.0000 mg | ORAL_TABLET | Freq: Two times a day (BID) | ORAL | Status: DC

## 2017-07-26 MED ORDER — ENOXAPARIN SODIUM 40 MG/0.4ML ~~LOC~~ SOLN
40.0000 mg | Freq: Every day | SUBCUTANEOUS | Status: DC
  Administered 2017-07-26: 40 mg via SUBCUTANEOUS

## 2017-07-26 MED ORDER — METHYLPREDNISOLONE SODIUM SUCC 125 MG IJ SOLR
60.0000 mg | Freq: Four times a day (QID) | INTRAMUSCULAR | Status: DC
  Administered 2017-07-26 – 2017-07-28 (×8): 60 mg via INTRAVENOUS
  Filled 2017-07-26 (×9): qty 2

## 2017-07-26 MED ORDER — SODIUM CHLORIDE 0.9 % IV SOLN
1.0000 g | Freq: Three times a day (TID) | INTRAVENOUS | Status: DC
  Administered 2017-07-26 – 2017-07-29 (×9): 1 g via INTRAVENOUS
  Filled 2017-07-26 (×19): qty 1

## 2017-07-26 MED ORDER — INSULIN ASPART 100 UNIT/ML ~~LOC~~ SOLN
0.0000 [IU] | Freq: Three times a day (TID) | SUBCUTANEOUS | Status: DC
  Administered 2017-07-26: 2 [IU] via SUBCUTANEOUS
  Administered 2017-07-27: 1 [IU] via SUBCUTANEOUS
  Administered 2017-07-27 – 2017-07-28 (×2): 2 [IU] via SUBCUTANEOUS
  Administered 2017-07-28: 1 [IU] via SUBCUTANEOUS
  Administered 2017-07-28 – 2017-07-29 (×2): 2 [IU] via SUBCUTANEOUS
  Administered 2017-07-29 – 2017-07-30 (×2): 1 [IU] via SUBCUTANEOUS
  Administered 2017-07-30: 2 [IU] via SUBCUTANEOUS

## 2017-07-26 NOTE — ED Notes (Addendum)
Ems gave 125 solu-medrol, 7.5 mg of albuterol and Atrovent while en-route.

## 2017-07-26 NOTE — Progress Notes (Signed)
Pharmacy Antibiotic Note  Sheila Pierce is a 72 y.o. female admitted on 07/26/2017 with pneumonia.(HCAP)  Pharmacy has been consulted for Vancomycin and Aztreonam dosing.  Plan: Vancomycin 1000mg  loading dose given in ED, then 750mg  IV every 24 hours.  Goal trough 15-20 mcg/mL.  Aztreonam 1g IV q8h F/U cxs and clinical progress Monitor V/S, labs, and levels as indicated  Height: 5\' 3"  (160 cm) Weight: 136 lb 0.4 oz (61.7 kg) IBW/kg (Calculated) : 52.4  Temp (24hrs), Avg:100.5 F (38.1 C), Min:97.9 F (36.6 C), Max:103.8 F (39.9 C)  Recent Labs  Lab 07/26/17 0434 07/26/17 0444  WBC 21.9*  --   CREATININE 1.82*  --   LATICACIDVEN  --  1.61    Estimated Creatinine Clearance: 23.1 mL/min (A) (by C-G formula based on SCr of 1.82 mg/dL (H)).    Allergies  Allergen Reactions  . Penicillins Swelling and Other (See Comments)    Reaction:  Unspecified swelling reaction Has patient had a PCN reaction causing immediate rash, facial/tongue/throat swelling, SOB or lightheadedness with hypotension: Yes Has patient had a PCN reaction causing severe rash involving mucus membranes or skin necrosis: No Has patient had a PCN reaction that required hospitalization No Has patient had a PCN reaction occurring within the last 10 years: Yes If all of the above answers are "NO", then may proceed with Cephalosporin use.  . Sulfa Antibiotics Hives and Other (See Comments)    Reaction:  Hallucinations   . Codeine Nausea Only  . Hydrocodone Nausea Only  . Levaquin [Levofloxacin] Itching, Swelling and Rash    Antimicrobials this admission: Vancomycin 7/2 >>  Aztreonam 7/2 >>   Dose adjustments this admission: n/a  Microbiology results: 7/2 BCx: pending 7/2 UCx: pending   MRSA PCR:  Thank you for allowing pharmacy to be a part of this patient's care.  Elder Cyphers, BS Pharm D, [place] Clinical Pharmacist Pager [phone] 07/26/2017 8:48 AM
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
## 2017-07-26 NOTE — Progress Notes (Signed)
Patient seen and examined, database reviewed.  No family members at bedside.  Patient was transferred from her skilled nursing facility early this morning due to shortness of breath.  She is known to have severe COPD with a baseline PCO2 in the 80s.  Tried to wean her off BiPAP today which was unsuccessful due to increased work of breathing and pursed lip breathing.  We attempted to contact patient's next of kin however this number has gone straight to voicemail after multiple attempts.  I am concerned that she may need to be intubated at some point, I believe it is very important to discuss goals of care.  She did present with some sepsis criteria, however without a source we are calling this SIRS for now.  I agree with broad-spectrum antibiotic therapy consisting of vancomycin and aztreonam for now given severity of illness.  WBC count was 21,000 on admission.  Agree with continuing steroids and antibiotics.  Will ask Dr. Juanetta Gosling to see patient in a.m.  Patient also has acute renal failure, giving IV fluids we will recheck levels in a.m.  Peggye Pitt, MD [facility] Pager: [phone]

## 2017-07-26 NOTE — Progress Notes (Signed)
**Note De-identified Annetta Deiss Obfuscation** Patient tolerated transport to ICU on BIPAP.  RRT to continue to monitor. 

## 2017-07-26 NOTE — Patient Outreach (Signed)
[facility]) Care Management  07/26/2017  Sheila Pierce December 27, 1945 [id number]    RN Health coach notified patient admitted to the hospital for Sepsis on 07/26/2017.  Plan: RN Health coach will close the case at this time.  [name] RN, BSN, [facility] RN Health Coach Disease Management [facility] Dial:  [phone]  Fax: [phone]

## 2017-07-26 NOTE — ED Triage Notes (Signed)
Patient came in by [facility] from [facility] for respiratory distress. Patient on c-pap

## 2017-07-26 NOTE — Progress Notes (Signed)
Patient removed from Bipap to receive nebulizer. She began to use assessory muscles and doing  Pursed lip breathing. Vitals were still stable but from the look of her and WOB she needed to be placed back on Bipap. Patient stable at this time.
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
## 2017-07-26 NOTE — H&P (Signed)
TRH H&P    Patient Demographics:    Sheila Pierce, is a 72 y.o. female  MRN: [id number]  DOB - [birth year]  Admit Date - 07/26/2017  Referring MD/NP/PA: Dr. Bebe Shaggy  Outpatient Primary MD for the patient is Patient, No Pcp Per  Patient coming from: Skilled nursing facility  Chief complaint-shortness of breath   HPI:    Sheila Pierce  is a 72 y.o. female, with history of chronic diastolic CHF, CKD stage III, chronic respiratory failure on home O2 3 L/min, diabetes mellitus, GERD, hypertension who was brought to the hospital for worsening shortness of breath.  In the ED patient was put on BiPAP.  She also was found to have fever with T-max 103.8. She denies chest pain. Denies nausea vomiting or diarrhea. Denies dysuria, urgency or frequency of urination. Chest x-ray showed no acute abnormality. Lab work revealed WBC 21.9, lactic acid 1.61 Patient empirically started on vancomycin and Zosyn for  sepsis.    Review of systems:      All other systems reviewed and are negative.   With Past History of the following :    Past Medical History:  Diagnosis Date  . Acute respiratory failure (HCC) 12/16/2016  . Allergy   . Anemia   . Anxiety   . Arthritis   . Asthma   . Cataract   . Chronic diastolic CHF (congestive heart failure) (HCC) 10/30/2016  . Chronic kidney disease   . CKD (chronic kidney disease), stage III (HCC) 12/17/2016  . COPD (chronic obstructive pulmonary disease) (HCC)    Chronic resp failure on home O2  . Depression   . Diabetes mellitus without complication (HCC)   . Emphysema of lung (HCC)   . GERD (gastroesophageal reflux disease)   . Hypercholesteremia 12/16/2015  . Hyperglycemia    Noted 09/2011 admission in setting of steroid use with normal HgbA1C.  Marland Kitchen Hypertension   . Hypokalemia   . Hyponatremia    Noted 09/2011 admission.  . NSTEMI (non-ST elevated myocardial infarction)  (HCC)    a. 09/2011 in setting of acute on chronic resp failure/COPD exacerbation --> cath demonstrated nonobstructive CAD 10/05/11 with EF 50%;  08/2012 elevated Ti in setting of COPD flare -->Echo: EF 60-65%, Gr 1 DD -->Med Rx.  . Osteoporosis   . Oxygen deficiency   . Pressure injury of skin 09/15/2016  . QT prolongation    Noted on EKG 09/2011 (590 in setting of K of 3, improved to 475 by discharge)  . Tobacco abuse 09/19/2012  . Trigeminal neuralgia   . Type 2 diabetes mellitus with stage 3 chronic kidney disease (HCC) 12/17/2016      Past Surgical History:  Procedure Laterality Date  . ABDOMINAL HYSTERECTOMY     bleeding  . CHOLECYSTECTOMY    . LEFT HEART CATHETERIZATION WITH CORONARY ANGIOGRAM N/A 10/05/2011   Procedure: LEFT HEART CATHETERIZATION WITH CORONARY ANGIOGRAM;  Surgeon: Tonny Bollman, MD;  Location: [facility] CATH LAB;  Service: Cardiovascular;  Laterality: N/A;  . PLANTAR'S WART EXCISION        Social  History:      Social History   Tobacco Use  . Smoking status: Former Smoker    Packs/day: 1.00    Years: 40.00    Pack years: 40.00    Types: Cigarettes    Start date: 01/26/1964    Last attempt to quit: 10/2014    Years since quitting: 2.7  . Smokeless tobacco: Never Used  Substance Use Topics  . Alcohol use: No    Comment: occasional       Family History :     Family History  Problem Relation Age of Onset  . Cancer Father        Lung  . Cancer Mother        Liver  . Hypertension Son   . Hyperlipidemia Son   . Post-traumatic stress disorder Son   . Depression Son   . Kidney disease Brother        liver and kidney failure  . Hypertension Son   . Hyperlipidemia Son   . Cancer Other        colon  . Diabetes Son   . Lung disease Daughter        passed away at 47 months old      Home Medications:   Prior to Admission medications   Medication Sig Start Date End Date Taking? Authorizing Provider  aspirin EC 81 MG tablet Take 81 mg by mouth daily.     [provider]  BREO ELLIPTA 100-25 MCG/INH AEPB Inhale 1 puff into the lungs daily. 11/11/15   [name], MD  busPIRone (BUSPAR) 15 MG tablet TAKE 1 TABLET BY MOUTH TWICE DAILY 05/31/16   [name], MD  cholecalciferol (VITAMIN D) 1000 units tablet Take 1,000 Units by mouth daily.    [provider]  dextromethorphan-guaiFENesin (MUCINEX DM) 30-600 MG 12hr tablet Take 1 tablet by mouth 2 (two) times daily as needed for cough. 02/05/17   Drema Dallas, MD  furosemide (LASIX) 20 MG tablet Take 2 tablets (40 mg total) by mouth daily. 02/05/17   Drema Dallas, MD  ipratropium-albuterol (DUONEB) 0.5-2.5 (3) MG/3ML SOLN Take 3 mLs by nebulization every 4 (four) hours. 11/11/15   [name], MD  metoprolol tartrate (LOPRESSOR) 25 MG tablet Take 0.5 tablets (12.5 mg total) by mouth 2 (two) times daily. 02/05/17   Drema Dallas, MD  Multiple Vitamin (MULTIVITAMIN WITH MINERALS) TABS tablet Take 1 tablet by mouth daily.    [provider]  OXYGEN Inhale 3 L into the lungs continuous.     [provider]  potassium chloride (K-DUR) 10 MEQ tablet Take 1 tablet (10 mEq total) by mouth daily. 03/25/16   [name], MD  tiotropium (SPIRIVA) 18 MCG inhalation capsule Place 18 mcg into inhaler and inhale daily.    [provider]  traMADol (ULTRAM) 50 MG tablet Take 1 tablet (50 mg total) by mouth every 6 (six) hours as needed for moderate pain or severe pain. 11/25/16   [name], MD  UNABLE TO [facility] bed mattress 03/15/17   [name], MD  VENTOLIN HFA 108 [phone] Base) MCG/ACT inhaler INHALE 2 PUFFS EVERY FOUR HOURS AS NEEDED FOR WHEEZING OR SHORTNESS OF BREATH 10/07/16   [name], MD     Allergies:     Allergies  Allergen Reactions  . Penicillins Swelling and Other (See Comments)    Reaction:  Unspecified swelling reaction Has patient had a PCN reaction  causing immediate rash, facial/tongue/throat  swelling, SOB or lightheadedness with hypotension: Yes Has patient had a PCN reaction causing severe rash involving mucus membranes or skin necrosis: No Has patient had a PCN reaction that required hospitalization No Has patient had a PCN reaction occurring within the last 10 years: Yes If all of the above answers are "NO", then may proceed with Cephalosporin use.  . Sulfa Antibiotics Hives and Other (See Comments)    Reaction:  Hallucinations   . Codeine Nausea Only  . Hydrocodone Nausea Only  . Levaquin [Levofloxacin] Itching, Swelling and Rash     Physical Exam:   Vitals  Blood pressure (!) 126/57, pulse (!) 135, temperature (!) 101.5 F (38.6 C), resp. rate (!) 28, height 5\' 1"  (1.549 m), weight 63 kg (139 lb), SpO2 93 %.  1.  General: Patient on BiPAP  2. Psychiatric:  Intact judgement and  insight, awake alert, oriented x 3.  3. Neurologic: No focal neurological deficits, all cranial nerves intact.Strength 5/5 all 4 extremities, sensation intact all 4 extremities, plantars down going.  4. Eyes :  anicteric sclerae, moist conjunctivae with no lid lag. PERRLA.  5. ENMT:  Oropharynx clear with moist mucous membranes and good dentition  6. Neck:  supple, no cervical lymphadenopathy appriciated, No thyromegaly  7. Respiratory : Normal respiratory effort, decreased breath sounds bilaterally  8. Cardiovascular : RRR, no gallops, rubs or murmurs, no leg edema  9. Gastrointestinal:  Positive bowel sounds, abdomen soft, non-tender to palpation,no hepatosplenomegaly, no rigidity or guarding       10. Skin:  No cyanosis, normal texture and turgor, no rash, lesions or ulcers  11.Musculoskeletal:  Good muscle tone,  joints appear normal , no effusions,  normal range of motion    Data Review:    CBC Recent Labs  Lab 07/26/17 0434  WBC 21.9*  HGB 13.0  HCT 41.2  PLT 338  MCV 87.5  MCH 27.6  MCHC 31.6  RDW 14.8  LYMPHSABS 2.8  MONOABS 1.4*  EOSABS 0.0    BASOSABS 0.0   ------------------------------------------------------------------------------------------------------------------  Chemistries  Recent Labs  Lab 07/26/17 0434  NA 135  K 4.7  CL 88*  CO2 34*  GLUCOSE 188*  BUN 26*  CREATININE 1.82*  CALCIUM 9.7  AST 31  ALT 26  ALKPHOS 74  BILITOT 1.2   ------------------------------------------------------------------------------------------------------------------  ------------------------------------------------------------------------------------------------------------------ GFR: Estimated Creatinine Clearance: 23.8 mL/min (A) (by C-G formula based on SCr of 1.82 mg/dL (H)). Liver Function Tests: Recent Labs  Lab 07/26/17 0434  AST 31  ALT 26  ALKPHOS 74  BILITOT 1.2  PROT 8.7*  ALBUMIN 3.8   No results for input(s): LIPASE, AMYLASE in the last 168 hours. No results for input(s): AMMONIA in the last 168 hours. Coagulation Profile: No results for input(s): INR, PROTIME in the last 168 hours. Cardiac Enzymes: No results for input(s): CKTOTAL, CKMB, CKMBINDEX, TROPONINI in the last 168 hours. BNP (last 3 results) No results for input(s): PROBNP in the last 8760 hours. HbA1C: No results for input(s): HGBA1C in the last 72 hours. CBG: Recent Labs  Lab 07/26/17 0430  GLUCAP 206*   Lipid Profile: No results for input(s): CHOL, HDL, LDLCALC, TRIG, CHOLHDL, LDLDIRECT in the last 72 hours. Thyroid Function Tests: No results for input(s): TSH, T4TOTAL, FREET4, T3FREE, THYROIDAB in the last 72 hours. Anemia Panel: No results for input(s): VITAMINB12, FOLATE, FERRITIN, TIBC, IRON, RETICCTPCT in the last 72 hours.  --------------------------------------------------------------------------------------------------------------- Urine analysis:    Component Value Date/Time  COLORURINE YELLOW 07/26/2017 0434   APPEARANCEUR HAZY (A) 07/26/2017 0434   LABSPEC 1.015 07/26/2017 0434   PHURINE 6.0 07/26/2017 0434    GLUCOSEU NEGATIVE 07/26/2017 0434   HGBUR TRACE (A) 07/26/2017 0434   BILIRUBINUR NEGATIVE 07/26/2017 0434   BILIRUBINUR neg 11/24/2016 1131   KETONESUR TRACE (A) 07/26/2017 0434   PROTEINUR 30 (A) 07/26/2017 0434   UROBILINOGEN 0.2 11/24/2016 1131   NITRITE NEGATIVE 07/26/2017 0434   LEUKOCYTESUR NEGATIVE 07/26/2017 0434      Imaging Results:    Dg Chest Port 1 View  Result Date: 07/26/2017 CLINICAL DATA:  72 year old female with respiratory distress EXAM: PORTABLE CHEST 1 VIEW COMPARISON:  .  Chest radiograph dated 03/18/2017 FINDINGS: There is emphysematous changes of the lungs with diffuse interstitial coarsening. No focal consolidation, pleural effusion, or pneumothorax. The cardiac silhouette is within normal limits. Atherosclerotic calcification of the aorta. No acute osseous pathology. IMPRESSION: 1. No acute cardiopulmonary process. 2. Emphysema. Electronically Signed   By: Elgie Collard M.D.   On: 07/26/2017 04:58    My personal review of EKG: Rhythm NSR   Assessment & Plan:    Active Problems:   Acute respiratory failure (HCC)   1. Acute hypercapnic respiratory failure-secondary to COPD exacerbation, started on Solu-Medrol 60 mg IV every 6 hours, DuoNeb nebulizers every 6 hours.  Continue BiPAP. 2. SIRS-patient presented with SIRS, started on vancomycin and Zosyn.  Unknown source of infection.  WBC is 21,000.  UA has been obtained.  Will follow urine analysis and culture results. 3. Acute kidney injury-patient's creatinine is elevated to 1.82, baseline creatinine is around 0.89.  This is likely from diuretics which patient takes at home.  At this time we will hold the Lasix.  Will avoid giving excessive fluids as patient has diastolic heart failure. 4. Hypertension-continue home medications metoprolol 12.5 mg p.o. twice daily.   DVT Prophylaxis-   Lovenox   AM Labs Ordered, also please review Full Orders  Family Communication: Admission, patients condition and plan  of care including tests being ordered have been discussed with the patient  who indicate understanding and agree with the plan and Code Status.  Code Status: Full code  Admission status: Inpatient  Time spent in minutes : 60 minutes   Meredeth Ide M.D on 07/26/2017 at 6:23 AM  Between 7am to 7pm - Pager - [phone]. After 7pm go to [email or web address] - password [facility]  [facility] - Office  [phone]

## 2017-07-27 ENCOUNTER — Inpatient Hospital Stay (HOSPITAL_COMMUNITY): Payer: Medicare HMO

## 2017-07-27 LAB — URINE CULTURE: CULTURE: NO GROWTH

## 2017-07-27 LAB — BLOOD CULTURE ID PANEL (REFLEXED)
Acinetobacter baumannii: NOT DETECTED
CANDIDA GLABRATA: NOT DETECTED
CANDIDA PARAPSILOSIS: NOT DETECTED
CANDIDA TROPICALIS: NOT DETECTED
Candida albicans: NOT DETECTED
Candida krusei: NOT DETECTED
ENTEROCOCCUS SPECIES: NOT DETECTED
ESCHERICHIA COLI: NOT DETECTED
Enterobacter cloacae complex: NOT DETECTED
Enterobacteriaceae species: NOT DETECTED
Haemophilus influenzae: NOT DETECTED
KLEBSIELLA OXYTOCA: NOT DETECTED
KLEBSIELLA PNEUMONIAE: NOT DETECTED
LISTERIA MONOCYTOGENES: NOT DETECTED
Methicillin resistance: NOT DETECTED
Neisseria meningitidis: NOT DETECTED
PROTEUS SPECIES: NOT DETECTED
Pseudomonas aeruginosa: NOT DETECTED
SERRATIA MARCESCENS: NOT DETECTED
STAPHYLOCOCCUS AUREUS BCID: NOT DETECTED
STAPHYLOCOCCUS SPECIES: DETECTED — AB
STREPTOCOCCUS AGALACTIAE: DETECTED — AB
STREPTOCOCCUS PYOGENES: NOT DETECTED
Streptococcus pneumoniae: NOT DETECTED
Streptococcus species: DETECTED — AB

## 2017-07-27 LAB — COMPREHENSIVE METABOLIC PANEL
ALBUMIN: 3 g/dL — AB (ref 3.5–5.0)
ALT: 21 U/L (ref 0–44)
AST: 22 U/L (ref 15–41)
Alkaline Phosphatase: 57 U/L (ref 38–126)
Anion gap: 12 (ref 5–15)
BUN: 34 mg/dL — AB (ref 8–23)
CHLORIDE: 100 mmol/L (ref 98–111)
CO2: 28 mmol/L (ref 22–32)
CREATININE: 1.15 mg/dL — AB (ref 0.44–1.00)
Calcium: 9.5 mg/dL (ref 8.9–10.3)
GFR calc Af Amer: 54 mL/min — ABNORMAL LOW (ref >60)
GFR, EST NON AFRICAN AMERICAN: 46 mL/min — AB (ref >60)
GLUCOSE: 144 mg/dL — AB (ref 70–99)
Potassium: 4.1 mmol/L (ref 3.5–5.1)
Sodium: 140 mmol/L (ref 135–145)
Total Bilirubin: 0.8 mg/dL (ref 0.3–1.2)
Total Protein: 7.2 g/dL (ref 6.5–8.1)

## 2017-07-27 LAB — CBC
HEMATOCRIT: 35.2 % — AB (ref 36.0–46.0)
Hemoglobin: 10.8 g/dL — ABNORMAL LOW (ref 12.0–15.0)
MCH: 27 pg (ref 26.0–34.0)
MCHC: 30.7 g/dL (ref 30.0–36.0)
MCV: 88 fL (ref 78.0–100.0)
PLATELETS: 207 10*3/uL (ref 150–400)
RBC: 4 MIL/uL (ref 3.87–5.11)
RDW: 14.8 % (ref 11.5–15.5)
WBC: 11.5 10*3/uL — AB (ref 4.0–10.5)

## 2017-07-27 LAB — GLUCOSE, CAPILLARY
GLUCOSE-CAPILLARY: 156 mg/dL — AB (ref 70–99)
GLUCOSE-CAPILLARY: 114 mg/dL — AB (ref 70–99)
Glucose-Capillary: 150 mg/dL — ABNORMAL HIGH (ref 70–99)
Glucose-Capillary: 147 mg/dL — ABNORMAL HIGH (ref 70–99)

## 2017-07-27 MED ORDER — ENOXAPARIN SODIUM 40 MG/0.4ML ~~LOC~~ SOLN
40.0000 mg | Freq: Every day | SUBCUTANEOUS | Status: DC
  Administered 2017-07-27 – 2017-07-30 (×4): 40 mg via SUBCUTANEOUS
  Filled 2017-07-27 (×4): qty 0.4
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
## 2017-07-27 NOTE — Care Management Important Message (Signed)
Important Message  Patient Details  Name: Sheila Pierce MRN: [id number] Date of Birth: 07-30-1945   Medicare Important Message Given:  Yes    Renie Ora 07/27/2017, 11:59 AM

## 2017-07-27 NOTE — Progress Notes (Signed)
Palliative: Mrs. Dyke Brackett, is resting quietly in bed.  She looks much improved from yesterday.  She greets me, making and keeping eye contact.  She is alert and oriented.  No family at bedside at this time. We talked about her current health concern, COPD flare.  Sahanna states that she knew she was having a flare.  We talked about healthcare surrogate, she names her son [name].  We also talked about advanced directives.  I share the concept of treating the treatable but no extraordinary measures such as CPR or intubation.  I shared that this is sometimes known as "DNR".  Gindy agrees that she would not want chest compressions if her heart were to naturally stop, and she would not want to be intubated.  She states that she feels that if she were to be intubated, she would not be able to get off the breathing machine.  I reassured Neira that she is looking better today, and she seems to be improving.  No other questions or or concerns at this time. Call to [facility], social worker Dorathy Daft.  Unable to reach her or leave a voicemail message. Conference with nursing staff related to plan of care, DNR status. Conference with hospitalist related to plan of care, DNR status. 40 minutes Lillia Carmel, NP Palliative Medicine Team Team Phone # [phone]

## 2017-07-27 NOTE — Clinical Social Work Note (Signed)
Clinical Social Work Assessment  Patient Details  Name: Sheila Pierce MRN: [id number] Date of Birth: [birth year]  Date of referral:  07/27/17               Reason for consult:  Facility Placement                Permission sought to share information with:    Permission granted to share information::     Name::        Agency::  Okey Regal, Armed forces technical officer at [employer]   Relationship::     Contact Information:     Housing/Transportation Living arrangements for the past 2 months:  Skilled Building surveyor of Information:  Facility Patient Interpreter Needed:  None Criminal Activity/Legal Involvement Pertinent to Current Situation/Hospitalization:  No - Comment as needed Significant Relationships:  Adult Children Lives with:  Facility Resident Do you feel safe going back to the place where you live?  Yes Need for family participation in patient care:  Yes (Comment)  Care giving concerns:  None identified.    Social Worker assessment / plan:  Patient has been at the facility siunce 03/23/17. She is total care. She feeds herself. Patient is bed bound and non compliant with therapy.   Employment status:  Retired Database administrator PT Recommendations:  Not assessed at this time Information / Referral to community resources:     Patient/Family's Response to care: Patient's family has been made aware of patient's hospitalization.   Patient/Family's Understanding of and Emotional Response to Diagnosis, Current Treatment, and Prognosis:  Patient's family have been provided information on patient's diagnosis, treatment and prognosis. They are meeting with palliative care to plan.   Emotional Assessment Appearance:  Appears stated age Attitude/Demeanor/Rapport:    Affect (typically observed):    Orientation:  Oriented to Self Alcohol / Substance use:  Not Applicable Psych involvement (Current and /or in the community):  No (Comment)  Discharge Needs   Concerns to be addressed:  Other (Comment Required(return to [facility] ) Readmission within the last 30 days:  No Current discharge risk:  None Barriers to Discharge:  No Barriers Identified   Annice Needy, LCSW 07/27/2017, 4:43 PM

## 2017-07-27 NOTE — Progress Notes (Signed)
RT removed patient from BIPAP and placed on 4L O2, patient SATs 92%, HR 116 and RR 21. RT will continue to monitor.
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
## 2017-07-27 NOTE — Progress Notes (Signed)
PROGRESS NOTE    Sheila Pierce  ZOX:[id number] DOB: 17-Feb-1945 DOA: 07/26/2017 PCP: Patient, No Pcp Per   Brief Narrative:    Sheila Pierce  is a 72 y.o. female, with history of chronic diastolic CHF grade 1 on 11/2016, chronic respiratory failure on home O2 3 L/min with COPD,GERD, hypertension who was brought to the hospital for worsening shortness of breath.  In the ED patient was put on BiPAP.  She also was found to have fever with T-max 103.8.  She was noted to have leukocytosis of 21,900 and lactic acid of 1.6 with clear chest x-ray.  She was empirically initiated on Zosyn and vancomycin per sepsis protocol.  Blood cultures thus far have shown some contaminant with coagulase-negative staph and some strep.   Assessment & Plan:   Active Problems:   Acute respiratory failure (HCC)   Sepsis (HCC)   Goals of care, counseling/discussion   DNR (do not resuscitate) discussion   Palliative care by specialist   1. Acute on chronic hypoxemic and hypercapnic respiratory failure secondary to COPD exacerbation.  Patient does wear 3 L nasal cannula at home.  Continue IV steroids as well as broad-spectrum antibiotics and bronchodilators.  Appreciate pulmonology recommendations with repeat chest x-ray ordered.  Wean off BiPAP to high flow nasal cannula in order diet today as tolerated. 2. Sepsis of undetermined etiology.  Possible pneumonia-we will check chest x-ray.  Blood cultures appear to be contaminant.  Continue IV antibiotics. 3. AKI.  Baseline creatinine is 0.89.  This is improving.  Only slightly positive fluid balance.  Time-limited gentle IV fluid to continue and avoid nephrotoxic agents. Repeat renal panel in am. 4. Chronic diastolic heart failure.  Monitor daily weights.  Currently euvolemic.  Hold home Lasix. 5. Hypertension.  Continue on Lopressor with stable control noted.  DVT prophylaxis:Lovenox Code Status: Full Family Communication: None at bedside Disposition Plan: Continue  treatment for COPD exacerbation and wean off oxygen.  Maintain on IV antibiotics.   Consultants:   Pulmonology-Dr. Juanetta Gosling  Procedures:   None  Antimicrobials:   Vancomycin and aztreonam 7/2->   Subjective: Patient seen and evaluated today with no new acute complaints or concerns. No acute concerns or events noted overnight.  She continues to remain on BiPAP and would like something to drink.  Objective: Vitals:   07/27/17 0600 07/27/17 0700 07/27/17 0800 07/27/17 0834  BP: (!) 143/71 (!) 142/79 (!) 141/73   Pulse: (!) 107 (!) 107 (!) 103   Resp: 17 (!) 24 15   Temp: 97.7 F (36.5 C) 97.7 F (36.5 C) 97.9 F (36.6 C)   TempSrc:      SpO2: 96% 97% 97% 97%  Weight:      Height:        Intake/Output Summary (Last 24 hours) at 07/27/2017 0912 Last data filed at 07/27/2017 0800 Gross per 24 hour  Intake 596.83 ml  Output 350 ml  Net 246.83 ml   Filed Weights   07/26/17 0429 07/26/17 0813  Weight: 63 kg (139 lb) 61.7 kg (136 lb 0.4 oz)    Examination:  General exam: Appears calm and comfortable  Respiratory system: Clear to auscultation. Respiratory effort normal.  Currently on BiPAP 14/6 50% FiO2. Cardiovascular system: S1 & S2 heard, RRR. No JVD, murmurs, rubs, gallops or clicks. No pedal edema. Gastrointestinal system: Abdomen is nondistended, soft and nontender. No organomegaly or masses felt. Normal bowel sounds heard. Central nervous system: Alert and oriented. No focal neurological deficits. Extremities: Symmetric 5  x 5 power. Skin: No rashes, lesions or ulcers Psychiatry: Judgement and insight appear normal. Mood & affect appropriate.     Data Reviewed: I have personally reviewed following labs and imaging studies  CBC: Recent Labs  Lab 07/26/17 0434 07/27/17 0531  WBC 21.9* 11.5*  NEUTROABS 17.8*  --   HGB 13.0 10.8*  HCT 41.2 35.2*  MCV 87.5 88.0  PLT 338 207   Basic Metabolic Panel: Recent Labs  Lab 07/26/17 0434 07/27/17 0531  NA 135  140  K 4.7 4.1  CL 88* 100  CO2 34* 28  GLUCOSE 188* 144*  BUN 26* 34*  CREATININE 1.82* 1.15*  CALCIUM 9.7 9.5   GFR: Estimated Creatinine Clearance: 36.6 mL/min (A) (by C-G formula based on SCr of 1.15 mg/dL (H)). Liver Function Tests: Recent Labs  Lab 07/26/17 0434 07/27/17 0531  AST 31 22  ALT 26 21  ALKPHOS 74 57  BILITOT 1.2 0.8  PROT 8.7* 7.2  ALBUMIN 3.8 3.0*   No results for input(s): LIPASE, AMYLASE in the last 168 hours. No results for input(s): AMMONIA in the last 168 hours. Coagulation Profile: No results for input(s): INR, PROTIME in the last 168 hours. Cardiac Enzymes: No results for input(s): CKTOTAL, CKMB, CKMBINDEX, TROPONINI in the last 168 hours. BNP (last 3 results) No results for input(s): PROBNP in the last 8760 hours. HbA1C: Recent Labs    07/26/17 0833  HGBA1C 5.5   CBG: Recent Labs  Lab 07/26/17 0430 07/26/17 1631 07/27/17 0816  GLUCAP 206* 173* 156*   Lipid Profile: No results for input(s): CHOL, HDL, LDLCALC, TRIG, CHOLHDL, LDLDIRECT in the last 72 hours. Thyroid Function Tests: No results for input(s): TSH, T4TOTAL, FREET4, T3FREE, THYROIDAB in the last 72 hours. Anemia Panel: No results for input(s): VITAMINB12, FOLATE, FERRITIN, TIBC, IRON, RETICCTPCT in the last 72 hours. Sepsis Labs: Recent Labs  Lab 07/26/17 0444 07/26/17 0831  LATICACIDVEN 1.61 0.9    Recent Results (from the past 240 hour(s))  Blood Culture (routine x 2)     Status: None (Preliminary result)   Collection Time: 07/26/17  4:34 AM  Result Value Ref Range Status   Specimen Description RIGHT ANTECUBITAL  Final   Special Requests   Final    BOTTLES DRAWN AEROBIC AND ANAEROBIC Blood Culture adequate volume Performed at [facility], [address] [place], [place] [address]    Culture  Setup Time   Final    GRAM POSITIVE COCCI IN BOTH AEROBIC AND ANAEROBIC BOTTLES Gram Stain Report Called to,Read Back By and Verified With: JD, RN @ 2107 ON 7.2.19 BY  L.BOWMAN FOR ANA BOTTLE R.WAGONER @ 2045 ON 7.2.19 BY L.BOWMAN FOR AEB BOTTLE Organism ID to follow R [facility] RN 07/27/17 0049 JDW    Culture PENDING  Incomplete   Report Status PENDING  Incomplete  Blood Culture ID Panel (Reflexed)     Status: Abnormal   Collection Time: 07/26/17  4:34 AM  Result Value Ref Range Status   Enterococcus species NOT DETECTED NOT DETECTED Final   Listeria monocytogenes NOT DETECTED NOT DETECTED Final   Staphylococcus species DETECTED (A) NOT DETECTED Final    Comment: Methicillin (oxacillin) susceptible coagulase negative staphylococcus. Possible blood culture contaminant (unless isolated from more than one blood culture draw or clinical case suggests pathogenicity). No antibiotic treatment is indicated for blood  culture contaminants. CRITICAL RESULT CALLED TO, READ BACK BY AND VERIFIED WITHArdis Rowan RN 07/27/17 0049 JDW    Staphylococcus aureus NOT DETECTED NOT  DETECTED Final   Methicillin resistance NOT DETECTED NOT DETECTED Final   Streptococcus species DETECTED (A) NOT DETECTED Final    Comment: CRITICAL RESULT CALLED TO, READ BACK BY AND VERIFIED WITH: R Ardelle Anton RN 07/27/17 0049 JDW    Streptococcus agalactiae DETECTED (A) NOT DETECTED Final    Comment: CRITICAL RESULT CALLED TO, READ BACK BY AND VERIFIED WITH: R WAGONER RN 07/27/17 0049 JDW    Streptococcus pneumoniae NOT DETECTED NOT DETECTED Final   Streptococcus pyogenes NOT DETECTED NOT DETECTED Final   Acinetobacter baumannii NOT DETECTED NOT DETECTED Final   Enterobacteriaceae species NOT DETECTED NOT DETECTED Final   Enterobacter cloacae complex NOT DETECTED NOT DETECTED Final   Escherichia coli NOT DETECTED NOT DETECTED Final   Klebsiella oxytoca NOT DETECTED NOT DETECTED Final   Klebsiella pneumoniae NOT DETECTED NOT DETECTED Final   Proteus species NOT DETECTED NOT DETECTED Final   Serratia marcescens NOT DETECTED NOT DETECTED Final   Haemophilus influenzae NOT DETECTED NOT DETECTED Final     Neisseria meningitidis NOT DETECTED NOT DETECTED Final   Pseudomonas aeruginosa NOT DETECTED NOT DETECTED Final   Candida albicans NOT DETECTED NOT DETECTED Final   Candida glabrata NOT DETECTED NOT DETECTED Final   Candida krusei NOT DETECTED NOT DETECTED Final   Candida parapsilosis NOT DETECTED NOT DETECTED Final   Candida tropicalis NOT DETECTED NOT DETECTED Final  Blood Culture (routine x 2)     Status: None (Preliminary result)   Collection Time: 07/26/17  5:07 AM  Result Value Ref Range Status   Specimen Description BLOOD LEFT HAND  Final   Special Requests   Final    BOTTLES DRAWN AEROBIC AND ANAEROBIC Blood Culture adequate volume   Culture  Setup Time PENDING  Incomplete   Culture   Final    NO GROWTH 1 DAY Performed at [facility], [address] [place], [place] [address]    Report Status PENDING  Incomplete  MRSA PCR Screening     Status: Abnormal   Collection Time: 07/26/17  8:31 AM  Result Value Ref Range Status   MRSA by PCR POSITIVE (A) NEGATIVE Final    Comment:        The GeneXpert MRSA Assay (FDA approved for NASAL specimens only), is one component of a comprehensive MRSA colonization surveillance program. It is not intended to diagnose MRSA infection nor to guide or monitor treatment for MRSA infections. RESULT CALLED TO, READ BACK BY AND VERIFIED WITH: MURPHY E. AT 1012A ON [id number] BY THOMPSON S. Performed at [facility], [address] [place], [place] [address]          Radiology Studies: Dg Chest [facility] 1 View  Result Date: 07/26/2017 CLINICAL DATA:  72 year old female with respiratory distress EXAM: PORTABLE CHEST 1 VIEW COMPARISON:  .  Chest radiograph dated 03/18/2017 FINDINGS: There is emphysematous changes of the lungs with diffuse interstitial coarsening. No focal consolidation, pleural effusion, or pneumothorax. The cardiac silhouette is within normal limits. Atherosclerotic calcification of the aorta. No acute osseous pathology.  IMPRESSION: 1. No acute cardiopulmonary process. 2. Emphysema. Electronically Signed   By: Elgie Collard M.D.   On: 07/26/2017 04:58        Scheduled Meds: . aspirin EC  81 mg Oral Daily  . Chlorhexidine Gluconate Cloth  6 each Topical Q0600  . enoxaparin (LOVENOX) injection  30 mg Subcutaneous Daily  . insulin aspart  0-9 Units Subcutaneous TID WC  . ipratropium-albuterol  3 mL Nebulization Q6H  . mouth  rinse  15 mL Mouth Rinse BID  . methylPREDNISolone (SOLU-MEDROL) injection  60 mg Intravenous Q6H  . metoprolol tartrate  12.5 mg Oral BID  . mupirocin ointment  1 application Nasal BID   Continuous Infusions: . sodium chloride 10 mL/hr at 07/27/17 0600  . aztreonam 1 g (07/27/17 0527)  . vancomycin 750 mg (07/27/17 0527)     LOS: 1 day    Time spent: 30 minutes    Ingris Pasquarella Hoover Brunette, DO [facility] Pager [phone]  If 7PM-7AM, please contact night-coverage [email or web address] Password [email or web address] 07/27/2017, 9:12 AM

## 2017-07-28 LAB — GLUCOSE, CAPILLARY
GLUCOSE-CAPILLARY: 162 mg/dL — AB (ref 70–99)
Glucose-Capillary: 172 mg/dL — ABNORMAL HIGH (ref 70–99)
Glucose-Capillary: 195 mg/dL — ABNORMAL HIGH (ref 70–99)
Glucose-Capillary: 134 mg/dL — ABNORMAL HIGH (ref 70–99)

## 2017-07-28 LAB — BASIC METABOLIC PANEL
Anion gap: 7 (ref 5–15)
BUN: 27 mg/dL — ABNORMAL HIGH (ref 8–23)
CO2: 32 mmol/L (ref 22–32)
Calcium: 9.6 mg/dL (ref 8.9–10.3)
Chloride: 102 mmol/L (ref 98–111)
Creatinine, Ser: 0.87 mg/dL (ref 0.44–1.00)
GFR calc Af Amer: >60 mL/min (ref >60)
GLUCOSE: 156 mg/dL — AB (ref 70–99)
POTASSIUM: 3.9 mmol/L (ref 3.5–5.1)
Sodium: 141 mmol/L (ref 135–145)

## 2017-07-28 LAB — CBC
HCT: 32.8 % — ABNORMAL LOW (ref 36.0–46.0)
Hemoglobin: 9.9 g/dL — ABNORMAL LOW (ref 12.0–15.0)
MCH: 26.7 pg (ref 26.0–34.0)
MCHC: 30.2 g/dL (ref 30.0–36.0)
MCV: 88.4 fL (ref 78.0–100.0)
PLATELETS: 255 10*3/uL (ref 150–400)
RBC: 3.71 MIL/uL — AB (ref 3.87–5.11)
RDW: 14.8 % (ref 11.5–15.5)
WBC: 11.6 10*3/uL — AB (ref 4.0–10.5)

## 2017-07-28 MED ORDER — FUROSEMIDE 40 MG PO TABS
40.0000 mg | ORAL_TABLET | Freq: Every day | ORAL | Status: DC
  Administered 2017-07-28 – 2017-07-30 (×3): 40 mg via ORAL
  Filled 2017-07-28 (×3): qty 1

## 2017-07-28 MED ORDER — IBUPROFEN 600 MG PO TABS
600.0000 mg | ORAL_TABLET | Freq: Four times a day (QID) | ORAL | Status: DC | PRN
  Administered 2017-07-28 – 2017-07-30 (×3): 600 mg via ORAL
  Filled 2017-07-28 (×4): qty 1

## 2017-07-28 MED ORDER — BOOST / RESOURCE BREEZE PO LIQD CUSTOM
1.0000 | Freq: Three times a day (TID) | ORAL | Status: DC
  Administered 2017-07-28 – 2017-07-30 (×8): 1 via ORAL

## 2017-07-28 MED ORDER — METHYLPREDNISOLONE SODIUM SUCC 40 MG IJ SOLR
40.0000 mg | Freq: Two times a day (BID) | INTRAMUSCULAR | Status: DC
Start: 2017-07-28 — End: 2017-07-29
  Administered 2017-07-28 – 2017-07-29 (×3): 40 mg via INTRAVENOUS
  Filled 2017-07-28 (×3): qty 1

## 2017-07-28 NOTE — Progress Notes (Signed)
Palliative: Mrs. Sheila Pierce, "[name]" is resting quietly in bed.  Is sleeping, and startles when I call her name.  She has difficulty seeing and hearing, but looks in my general direction as I speak to her.  She was resting comfortably, but when she wakens she has some pursed lip breathing.  There is no family at bedside at this time.  Sheila Pierce states that she does feel that she is getting better.  I asked where she expects to go when she leaves the hospital.  Sheila Pierce shares that she would like to go home, but that her son works and cannot care for her.  I share that we anticipate she would return to [facility].  Brief goals of care discussion, Sheila Pierce would like to be re hospitalized as needed at this point. No questions or concerns, please re consult on next admission. 25 minutes Sheila Carmel, NP Palliative Medicine Team Team Phone # [phone]

## 2017-07-28 NOTE — Progress Notes (Signed)
PROGRESS NOTE    Sheila Pierce  ZOX:[id number] DOB: 03/08/1945 DOA: 07/26/2017 PCP: Patient, No Pcp Per   Brief Narrative:    Sheila Pierce  is a 72 y.o. female, with history of chronic diastolic CHF grade 1 on 11/2016, chronic respiratory failure on home O2 3 L/min with COPD,GERD, hypertension who was brought to the hospital for worsening shortness of breath.  In the ED patient was put on BiPAP.  She also was found to have fever with T-max 103.8.  She was noted to have leukocytosis of 21,900 and lactic acid of 1.6 with clear chest x-ray.  She was empirically initiated on Zosyn and vancomycin per sepsis protocol.  Blood cultures thus far have shown some contaminant with coagulase-negative staph and some strep.   Assessment & Plan:   Active Problems:   Acute respiratory failure (HCC)   Sepsis (HCC)   Goals of care, counseling/discussion   DNR (do not resuscitate) discussion   Palliative care by specialist   1. Acute on chronic hypoxemic and hypercapnic respiratory failure secondary to COPD exacerbation-improving.  Patient does wear 3 L nasal cannula at home.  Continue IV steroids at lower dose of 40mg  bid as well as broad-spectrum antibiotics and bronchodilators.  Appreciate pulmonology recommendations with repeat chest x-ray not demonstrating many findings.  On nasal cannula and will transfer to floor today. 2. Sepsis of undetermined etiology.  Possible pneumonia-we will check chest x-ray.  Blood cultures appear to be contaminant.  Continue IV antibiotics. 3. AKI-resolved.  Baseline creatinine is 0.89.  This is resolved.  Only slightly positive fluid balance. Avoid nephrotoxic agents. Repeat renal panel in am. 4. Chronic diastolic heart failure.  Monitor daily weights.  Currently with positive FB. Resume home lasix with IVF DC. 5. Hypertension.  Continue on Lopressor with current dose and consider increasing to 25mg  bid if persistently tachycardic-once bronchospasms resolved.  DVT  prophylaxis:Lovenox Code Status: DNR Family Communication: None at bedside Disposition Plan: Continue treatment for COPD exacerbation and wean off oxygen.  Maintain on IV antibiotics. Transfer to floor.   Consultants:   Pulmonology-Dr. Juanetta Gosling  Palliative Rachel Moulds  Procedures:   None  Antimicrobials:   Vancomycin and aztreonam 7/2->   Subjective: Patient seen and evaluated today with no new acute complaints or concerns. No acute concerns or events noted overnight.  She has refused bipap overnight and seems to be doing better this am.  Objective: Vitals:   07/28/17 0802 07/28/17 0900 07/28/17 0904 07/28/17 1007  BP:  (!) 159/78 (!) 159/78 (!) 167/89  Pulse:  (!) 116 (!) 117 (!) 108  Resp:  17  20  Temp:    97.7 F (36.5 C)  TempSrc:    Oral  SpO2: 93% 92%  95%  Weight:      Height:        Intake/Output Summary (Last 24 hours) at 07/28/2017 1413 Last data filed at 07/28/2017 1121 Gross per 24 hour  Intake 3145.33 ml  Output 1400 ml  Net 1745.33 ml   Filed Weights   07/26/17 0429 07/26/17 0813 07/28/17 0500  Weight: 63 kg (139 lb) 61.7 kg (136 lb 0.4 oz) 62.8 kg (138 lb 7.2 oz)    Examination:  General exam: Appears calm and comfortable  Respiratory system: Clear to auscultation. Respiratory effort normal.  Currently on BiPAP 14/6 50% FiO2. Cardiovascular system: S1 & S2 heard, RRR. No JVD, murmurs, rubs, gallops or clicks. No pedal edema. Gastrointestinal system: Abdomen is nondistended, soft and nontender. No organomegaly or  masses felt. Normal bowel sounds heard. Central nervous system: Alert and oriented. No focal neurological deficits. Extremities: Symmetric 5 x 5 power. Skin: No rashes, lesions or ulcers Psychiatry: Judgement and insight appear normal. Mood & affect appropriate.     Data Reviewed: I have personally reviewed following labs and imaging studies  CBC: Recent Labs  Lab 07/26/17 0434 07/27/17 0531 07/28/17 0402  WBC 21.9* 11.5*  11.6*  NEUTROABS 17.8*  --   --   HGB 13.0 10.8* 9.9*  HCT 41.2 35.2* 32.8*  MCV 87.5 88.0 88.4  PLT 338 207 255   Basic Metabolic Panel: Recent Labs  Lab 07/26/17 0434 07/27/17 0531 07/28/17 0402  NA 135 140 141  K 4.7 4.1 3.9  CL 88* 100 102  CO2 34* 28 32  GLUCOSE 188* 144* 156*  BUN 26* 34* 27*  CREATININE 1.82* 1.15* 0.87  CALCIUM 9.7 9.5 9.6   GFR: Estimated Creatinine Clearance: 48.4 mL/min (by C-G formula based on SCr of 0.87 mg/dL). Liver Function Tests: Recent Labs  Lab 07/26/17 0434 07/27/17 0531  AST 31 22  ALT 26 21  ALKPHOS 74 57  BILITOT 1.2 0.8  PROT 8.7* 7.2  ALBUMIN 3.8 3.0*   No results for input(s): LIPASE, AMYLASE in the last 168 hours. No results for input(s): AMMONIA in the last 168 hours. Coagulation Profile: No results for input(s): INR, PROTIME in the last 168 hours. Cardiac Enzymes: No results for input(s): CKTOTAL, CKMB, CKMBINDEX, TROPONINI in the last 168 hours. BNP (last 3 results) No results for input(s): PROBNP in the last 8760 hours. HbA1C: Recent Labs    07/26/17 0833  HGBA1C 5.5   CBG: Recent Labs  Lab 07/27/17 1101 07/27/17 1705 07/27/17 2125 07/28/17 0750 07/28/17 1139  GLUCAP 114* 150* 147* 172* 195*   Lipid Profile: No results for input(s): CHOL, HDL, LDLCALC, TRIG, CHOLHDL, LDLDIRECT in the last 72 hours. Thyroid Function Tests: No results for input(s): TSH, T4TOTAL, FREET4, T3FREE, THYROIDAB in the last 72 hours. Anemia Panel: No results for input(s): VITAMINB12, FOLATE, FERRITIN, TIBC, IRON, RETICCTPCT in the last 72 hours. Sepsis Labs: Recent Labs  Lab 07/26/17 0444 07/26/17 0831  LATICACIDVEN 1.61 0.9    Recent Results (from the past 240 hour(s))  Blood Culture (routine x 2)     Status: None (Preliminary result)   Collection Time: 07/26/17  4:34 AM  Result Value Ref Range Status   Specimen Description   Final    RIGHT ANTECUBITAL Performed at [facility], [address] [place], [place]  [address]    Special Requests   Final    BOTTLES DRAWN AEROBIC AND ANAEROBIC Blood Culture adequate volume Performed at [facility], [address], [place], [place] [address]    Culture  Setup Time   Final    GRAM POSITIVE COCCI IN BOTH AEROBIC AND ANAEROBIC BOTTLES Gram Stain Report Called to,Read Back By and Verified With: JD, RN @ 2107 ON 7.2.19 BY L.BOWMAN FOR ANA BOTTLE R.WAGONER @ 2045 ON 7.2.19 BY L.BOWMAN FOR AEB BOTTLE CRITICAL RESULT CALLED TO, READ BACK BY AND VERIFIED WITHArdis Rowan RN 07/27/17 0049 JDW    Culture   Final    Romie Minus POSITIVE COCCI IDENTIFICATION TO FOLLOW Performed at [facility], [address] [place], [place] [address]    Report Status PENDING  Incomplete  Blood Culture ID Panel (Reflexed)     Status: Abnormal   Collection Time: 07/26/17  4:34 AM  Result Value Ref Range Status  Enterococcus species NOT DETECTED NOT DETECTED Final   Listeria monocytogenes NOT DETECTED NOT DETECTED Final   Staphylococcus species DETECTED (A) NOT DETECTED Final    Comment: Methicillin (oxacillin) susceptible coagulase negative staphylococcus. Possible blood culture contaminant (unless isolated from more than one blood culture draw or clinical case suggests pathogenicity). No antibiotic treatment is indicated for blood  culture contaminants. CRITICAL RESULT CALLED TO, READ BACK BY AND VERIFIED WITH: R Ardelle Anton RN 07/27/17 0049 JDW    Staphylococcus aureus NOT DETECTED NOT DETECTED Final   Methicillin resistance NOT DETECTED NOT DETECTED Final   Streptococcus species DETECTED (A) NOT DETECTED Final    Comment: CRITICAL RESULT CALLED TO, READ BACK BY AND VERIFIED WITH: Ardis Rowan RN 07/27/17 0049 JDW    Streptococcus agalactiae DETECTED (A) NOT DETECTED Final    Comment: CRITICAL RESULT CALLED TO, READ BACK BY AND VERIFIED WITH: R WAGONER RN 07/27/17 0049 JDW    Streptococcus pneumoniae NOT DETECTED NOT DETECTED Final   Streptococcus pyogenes NOT DETECTED NOT DETECTED Final     Acinetobacter baumannii NOT DETECTED NOT DETECTED Final   Enterobacteriaceae species NOT DETECTED NOT DETECTED Final   Enterobacter cloacae complex NOT DETECTED NOT DETECTED Final   Escherichia coli NOT DETECTED NOT DETECTED Final   Klebsiella oxytoca NOT DETECTED NOT DETECTED Final   Klebsiella pneumoniae NOT DETECTED NOT DETECTED Final   Proteus species NOT DETECTED NOT DETECTED Final   Serratia marcescens NOT DETECTED NOT DETECTED Final   Haemophilus influenzae NOT DETECTED NOT DETECTED Final   Neisseria meningitidis NOT DETECTED NOT DETECTED Final   Pseudomonas aeruginosa NOT DETECTED NOT DETECTED Final   Candida albicans NOT DETECTED NOT DETECTED Final   Candida glabrata NOT DETECTED NOT DETECTED Final   Candida krusei NOT DETECTED NOT DETECTED Final   Candida parapsilosis NOT DETECTED NOT DETECTED Final   Candida tropicalis NOT DETECTED NOT DETECTED Final  Blood Culture (routine x 2)     Status: None (Preliminary result)   Collection Time: 07/26/17  5:07 AM  Result Value Ref Range Status   Specimen Description BLOOD LEFT HAND  Final   Special Requests   Final    BOTTLES DRAWN AEROBIC AND ANAEROBIC Blood Culture adequate volume   Culture  Setup Time PENDING  Incomplete   Culture   Final    NO GROWTH 2 DAYS Performed at [facility], [address], [place], [place] [address]    Report Status PENDING  Incomplete  Culture, Urine     Status: None   Collection Time: 07/26/17  6:32 AM  Result Value Ref Range Status   Specimen Description   Final    URINE, RANDOM Performed at [facility], [address], [place], [place] [address]    Special Requests   Final    NONE Performed at [facility], [address], [place], [place] [address]    Culture   Final    NO GROWTH Performed at [facility], [address] [place], [place] [address]    Report Status 07/27/2017 FINAL  Final  MRSA PCR Screening     Status: Abnormal   Collection Time: 07/26/17  8:31 AM  Result Value  Ref Range Status   MRSA by PCR POSITIVE (A) NEGATIVE Final    Comment:        The GeneXpert MRSA Assay (FDA approved for NASAL specimens only), is one component of a comprehensive MRSA colonization surveillance program. It is not intended to diagnose MRSA infection nor to guide or  monitor treatment for MRSA infections. RESULT CALLED TO, READ BACK BY AND VERIFIED WITH: MURPHY E. AT 1012A ON [id number] BY THOMPSON S. Performed at [facility], [address], [place], [place] [address]          Radiology Studies: Dg Chest [facility] 1 View  Result Date: 07/27/2017 CLINICAL DATA:  Respiratory failure EXAM: PORTABLE CHEST 1 VIEW COMPARISON:  07/26/2017 FINDINGS: Cardiac shadow is stable. Diffuse interstitial changes are again identified throughout both lungs. Mild bibasilar atelectasis is noted slightly greater on the left than the right. No pneumothorax or effusion is seen. No bony abnormality is noted. IMPRESSION: Bibasilar atelectatic changes as described superimposed over more chronic interstitial change. Electronically Signed   By: Alcide Clever M.D.   On: 07/27/2017 09:14        Scheduled Meds: . aspirin EC  81 mg Oral Daily  . Chlorhexidine Gluconate Cloth  6 each Topical Q0600  . enoxaparin (LOVENOX) injection  40 mg Subcutaneous Daily  . feeding supplement  1 Container Oral TID BM  . furosemide  40 mg Oral Daily  . insulin aspart  0-9 Units Subcutaneous TID WC  . ipratropium-albuterol  3 mL Nebulization Q6H  . mouth rinse  15 mL Mouth Rinse BID  . methylPREDNISolone (SOLU-MEDROL) injection  40 mg Intravenous Q12H  . metoprolol tartrate  12.5 mg Oral BID  . mupirocin ointment  1 application Nasal BID   Continuous Infusions: . aztreonam Stopped (07/28/17 0551)  . vancomycin Stopped (07/28/17 1638)     LOS: 2 days    Time spent: 30 minutes    Jorene Kaylor Hoover Brunette, DO [facility] Pager [phone]  If 7PM-7AM, please contact night-coverage [email or web address] Password  [email or web address] 07/28/2017, 2:13 PM

## 2017-07-28 NOTE — Progress Notes (Signed)
Subjective: She says she feels okay.  She says she feels like she is approaching her baseline but I am not totally certain that she understands what I am asking her.  She is hard of hearing so communication is difficult.  She has no other new complaints.  Objective: Vital signs in last 24 hours: Temp:  [97.7 F (36.5 C)-98.3 F (36.8 C)] 97.7 F (36.5 C) (07/04 0741) Pulse Rate:  [90-120] 99 (07/04 0800) Resp:  [13-26] 17 (07/04 0800) BP: (132-180)/(72-121) 138/86 (07/04 0800) SpO2:  [91 %-98 %] 93 % (07/04 0802) Weight:  [62.8 kg (138 lb 7.2 oz)] 62.8 kg (138 lb 7.2 oz) (07/04 0500) Weight change: 1.1 kg (2 lb 6.8 oz)    Intake/Output from previous day: 07/03 0701 - 07/04 0700 In: 3165.3 [P.O.:720; I.V.:445.3; IV Piggyback:2000] Out: 800 [Urine:800]  PHYSICAL EXAM General appearance: alert, cooperative and no distress Resp: rhonchi bilaterally Cardio: regular rate and rhythm, S1, S2 normal, no murmur, click, rub or gallop GI: soft, non-tender; bowel sounds normal; no masses,  no organomegaly Extremities: extremities normal, atraumatic, no cyanosis or edema  Lab Results:  Results for orders placed or performed during the hospital encounter of 07/26/17 (from the past 48 hour(s))  Glucose, capillary     Status: Abnormal   Collection Time: 07/26/17  4:31 PM  Result Value Ref Range   Glucose-Capillary 173 (H) 70 - 99 mg/dL  CBC     Status: Abnormal   Collection Time: 07/27/17  5:31 AM  Result Value Ref Range   WBC 11.5 (H) 4.0 - 10.5 K/uL   RBC 4.00 3.87 - 5.11 MIL/uL   Hemoglobin 10.8 (L) 12.0 - 15.0 g/dL   HCT 35.2 (L) 36.0 - 46.0 %   MCV 88.0 78.0 - 100.0 fL   MCH 27.0 26.0 - 34.0 pg   MCHC 30.7 30.0 - 36.0 g/dL   RDW 14.8 11.5 - 15.5 %   Platelets 207 150 - 400 K/uL    Comment: Performed at [facility], [address] [place], [place] [address]  Comprehensive metabolic panel     Status: Abnormal   Collection Time: 07/27/17  5:31 AM  Result Value Ref Range   Sodium  140 135 - 145 mmol/L   Potassium 4.1 3.5 - 5.1 mmol/L   Chloride 100 98 - 111 mmol/L    Comment: Please note change in reference range.   CO2 28 22 - 32 mmol/L   Glucose, Bld 144 (H) 70 - 99 mg/dL    Comment: Please note change in reference range.   BUN 34 (H) 8 - 23 mg/dL    Comment: Please note change in reference range.   Creatinine, Ser 1.15 (H) 0.44 - 1.00 mg/dL   Calcium 9.5 8.9 - 10.3 mg/dL   Total Protein 7.2 6.5 - 8.1 g/dL   Albumin 3.0 (L) 3.5 - 5.0 g/dL   AST 22 15 - 41 U/L   ALT 21 0 - 44 U/L    Comment: Please note change in reference range.   Alkaline Phosphatase 57 38 - 126 U/L   Total Bilirubin 0.8 0.3 - 1.2 mg/dL   GFR calc non Af Amer 46 (L) >60 mL/min   GFR calc Af Amer 54 (L) >60 mL/min    Comment: (NOTE) The eGFR has been calculated using the CKD EPI equation. This calculation has not been validated in all clinical situations. eGFR's persistently <60 mL/min signify possible Chronic Kidney Disease.    Anion gap 12 5 -  15    Comment: Performed at [facility], [address] [place], [place] [address]  Glucose, capillary     Status: Abnormal   Collection Time: 07/27/17  8:16 AM  Result Value Ref Range   Glucose-Capillary 156 (H) 70 - 99 mg/dL  Glucose, capillary     Status: Abnormal   Collection Time: 07/27/17 11:01 AM  Result Value Ref Range   Glucose-Capillary 114 (H) 70 - 99 mg/dL  Glucose, capillary     Status: Abnormal   Collection Time: 07/27/17  5:05 PM  Result Value Ref Range   Glucose-Capillary 150 (H) 70 - 99 mg/dL  Glucose, capillary     Status: Abnormal   Collection Time: 07/27/17  9:25 PM  Result Value Ref Range   Glucose-Capillary 147 (H) 70 - 99 mg/dL  CBC     Status: Abnormal   Collection Time: 07/28/17  4:02 AM  Result Value Ref Range   WBC 11.6 (H) 4.0 - 10.5 K/uL   RBC 3.71 (L) 3.87 - 5.11 MIL/uL   Hemoglobin 9.9 (L) 12.0 - 15.0 g/dL   HCT 32.8 (L) 36.0 - 46.0 %   MCV 88.4 78.0 - 100.0 fL   MCH 26.7 26.0 - 34.0 pg   MCHC 30.2  30.0 - 36.0 g/dL   RDW 14.8 11.5 - 15.5 %   Platelets 255 150 - 400 K/uL    Comment: Performed at [facility], [address] [place], [place] [address]  Basic metabolic panel     Status: Abnormal   Collection Time: 07/28/17  4:02 AM  Result Value Ref Range   Sodium 141 135 - 145 mmol/L   Potassium 3.9 3.5 - 5.1 mmol/L   Chloride 102 98 - 111 mmol/L    Comment: Please note change in reference range.   CO2 32 22 - 32 mmol/L   Glucose, Bld 156 (H) 70 - 99 mg/dL    Comment: Please note change in reference range.   BUN 27 (H) 8 - 23 mg/dL    Comment: Please note change in reference range.   Creatinine, Ser 0.87 0.44 - 1.00 mg/dL   Calcium 9.6 8.9 - 10.3 mg/dL   GFR calc non Af Amer >60 >60 mL/min   GFR calc Af Amer >60 >60 mL/min    Comment: (NOTE) The eGFR has been calculated using the CKD EPI equation. This calculation has not been validated in all clinical situations. eGFR's persistently <60 mL/min signify possible Chronic Kidney Disease.    Anion gap 7 5 - 15    Comment: Performed at [facility], [address] [place], [place] [address]  Glucose, capillary     Status: Abnormal   Collection Time: 07/28/17  7:50 AM  Result Value Ref Range   Glucose-Capillary 172 (H) 70 - 99 mg/dL    ABGS Recent Labs    07/26/17 0435  PHART 7.353  PO2ART 195*  HCO3 31.6*   CULTURES Recent Results (from the past 240 hour(s))  Blood Culture (routine x 2)     Status: None (Preliminary result)   Collection Time: 07/26/17  4:34 AM  Result Value Ref Range Status   Specimen Description   Final    RIGHT ANTECUBITAL Performed at [facility], [address], [place], [place] [address]    Special Requests   Final    BOTTLES DRAWN AEROBIC AND ANAEROBIC Blood Culture adequate volume Performed at [facility], [address], [place], [place] [address]    Culture  Setup Time  Final    GRAM POSITIVE COCCI IN BOTH AEROBIC AND ANAEROBIC BOTTLES Gram Stain Report Called to,Read Back By and  Verified With: JD, RN @ 2107 ON 7.2.19 BY L.BOWMAN FOR ANA BOTTLE R.WAGONER @ 2045 ON 7.2.19 BY L.BOWMAN FOR AEB BOTTLE CRITICAL RESULT CALLED TO, READ BACK BY AND VERIFIED WITHLynwood Dawley RN 07/27/17 0049 JDW    Culture   Final    GRAM POSITIVE COCCI CULTURE REINCUBATED FOR BETTER GROWTH Performed at [facility], [place] [address] [place], [place] [address]    Report Status PENDING  Incomplete  Blood Culture ID Panel (Reflexed)     Status: Abnormal   Collection Time: 07/26/17  4:34 AM  Result Value Ref Range Status   Enterococcus species NOT DETECTED NOT DETECTED Final   Listeria monocytogenes NOT DETECTED NOT DETECTED Final   Staphylococcus species DETECTED (A) NOT DETECTED Final    Comment: Methicillin (oxacillin) susceptible coagulase negative staphylococcus. Possible blood culture contaminant (unless isolated from more than one blood culture draw or clinical case suggests pathogenicity). No antibiotic treatment is indicated for blood  culture contaminants. CRITICAL RESULT CALLED TO, READ BACK BY AND VERIFIED WITH: R Jacqualyn Posey RN 07/27/17 0049 JDW    Staphylococcus aureus NOT DETECTED NOT DETECTED Final   Methicillin resistance NOT DETECTED NOT DETECTED Final   Streptococcus species DETECTED (A) NOT DETECTED Final    Comment: CRITICAL RESULT CALLED TO, READ BACK BY AND VERIFIED WITH: Lynwood Dawley RN 07/27/17 0049 JDW    Streptococcus agalactiae DETECTED (A) NOT DETECTED Final    Comment: CRITICAL RESULT CALLED TO, READ BACK BY AND VERIFIED WITH: R WAGONER RN 07/27/17 0049 JDW    Streptococcus pneumoniae NOT DETECTED NOT DETECTED Final   Streptococcus pyogenes NOT DETECTED NOT DETECTED Final   Acinetobacter baumannii NOT DETECTED NOT DETECTED Final   Enterobacteriaceae species NOT DETECTED NOT DETECTED Final   Enterobacter cloacae complex NOT DETECTED NOT DETECTED Final   Escherichia coli NOT DETECTED NOT DETECTED Final   Klebsiella oxytoca NOT DETECTED NOT DETECTED Final   Klebsiella  pneumoniae NOT DETECTED NOT DETECTED Final   Proteus species NOT DETECTED NOT DETECTED Final   Serratia marcescens NOT DETECTED NOT DETECTED Final   Haemophilus influenzae NOT DETECTED NOT DETECTED Final   Neisseria meningitidis NOT DETECTED NOT DETECTED Final   Pseudomonas aeruginosa NOT DETECTED NOT DETECTED Final   Candida albicans NOT DETECTED NOT DETECTED Final   Candida glabrata NOT DETECTED NOT DETECTED Final   Candida krusei NOT DETECTED NOT DETECTED Final   Candida parapsilosis NOT DETECTED NOT DETECTED Final   Candida tropicalis NOT DETECTED NOT DETECTED Final  Blood Culture (routine x 2)     Status: None (Preliminary result)   Collection Time: 07/26/17  5:07 AM  Result Value Ref Range Status   Specimen Description BLOOD LEFT HAND  Final   Special Requests   Final    BOTTLES DRAWN AEROBIC AND ANAEROBIC Blood Culture adequate volume   Culture  Setup Time PENDING  Incomplete   Culture   Final    NO GROWTH 2 DAYS Performed at [facility], [address] [place], [place] [address]    Report Status PENDING  Incomplete  Culture, Urine     Status: None   Collection Time: 07/26/17  6:32 AM  Result Value Ref Range Status   Specimen Description   Final    URINE, RANDOM Performed at [facility], [address], [place],  [address]    Special Requests  Final    NONE Performed at [facility], [address] [place], [place] [address]    Culture   Final    NO GROWTH Performed at [facility], [place] [address] [place], [place] [address]    Report Status 07/27/2017 FINAL  Final  MRSA PCR Screening     Status: Abnormal   Collection Time: 07/26/17  8:31 AM  Result Value Ref Range Status   MRSA by PCR POSITIVE (A) NEGATIVE Final    Comment:        The GeneXpert MRSA Assay (FDA approved for NASAL specimens only), is one component of a comprehensive MRSA colonization surveillance program. It is not intended to diagnose MRSA infection nor to guide or monitor  treatment for MRSA infections. RESULT CALLED TO, READ BACK BY AND VERIFIED WITH: MURPHY E. AT 1012A ON 188416 BY THOMPSON S. Performed at [facility], [address] [place], [place] [address]    Studies/Results: Dg Chest Port 1 View  Result Date: 07/27/2017 CLINICAL DATA:  Respiratory failure EXAM: PORTABLE CHEST 1 VIEW COMPARISON:  07/26/2017 FINDINGS: Cardiac shadow is stable. Diffuse interstitial changes are again identified throughout both lungs. Mild bibasilar atelectasis is noted slightly greater on the left than the right. No pneumothorax or effusion is seen. No bony abnormality is noted. IMPRESSION: Bibasilar atelectatic changes as described superimposed over more chronic interstitial change. Electronically Signed   By: Inez Catalina M.D.   On: 07/27/2017 09:14    Medications:  Prior to Admission:  Medications Prior to Admission  Medication Sig Dispense Refill Last Dose  . budesonide-formoterol (SYMBICORT) 160-4.5 MCG/ACT inhaler Inhale 2 puffs into the lungs 2 (two) times daily.   07/25/2017 at Unknown time  . busPIRone (BUSPAR) 15 MG tablet TAKE 1 TABLET BY MOUTH TWICE DAILY 180 tablet 1 07/25/2017 at Unknown time  . cholecalciferol (VITAMIN D) 1000 units tablet Take 1,000 Units by mouth daily.   07/25/2017 at Unknown time  . dextromethorphan-guaiFENesin (MUCINEX DM) 30-600 MG 12hr tablet Take 1 tablet by mouth 2 (two) times daily as needed for cough. 30 tablet 0 07/25/2017 at Unknown time  . furosemide (LASIX) 20 MG tablet Take 2 tablets (40 mg total) by mouth daily. 60 tablet 0 07/25/2017 at Unknown time  . ipratropium-albuterol (DUONEB) 0.5-2.5 (3) MG/3ML SOLN Take 3 mLs by nebulization every 4 (four) hours. 360 mL 5 07/25/2017 at Unknown time  . Melatonin-Pyridoxine (MELATIN) 3-1 MG TABS Take 6 mg by mouth at bedtime.   Past Week at Unknown time  . metoprolol tartrate (LOPRESSOR) 25 MG tablet Take 0.5 tablets (12.5 mg total) by mouth 2 (two) times daily. 30 tablet 0 07/25/2017 at 800  .  Multiple Vitamin (MULTIVITAMIN WITH MINERALS) TABS tablet Take 1 tablet by mouth daily.   07/25/2017 at Unknown time  . potassium chloride (K-DUR) 10 MEQ tablet Take 1 tablet (10 mEq total) by mouth daily. 90 tablet 3 07/25/2017 at Unknown time  . simvastatin (ZOCOR) 10 MG tablet Take 10 mg by mouth daily.   07/25/2017 at Unknown time  . tiotropium (SPIRIVA) 18 MCG inhalation capsule Place 18 mcg into inhaler and inhale daily.   07/25/2017 at Unknown time  . traMADol (ULTRAM) 50 MG tablet Take 1 tablet (50 mg total) by mouth every 6 (six) hours as needed for moderate pain or severe pain. 30 tablet 0 Past Week at Unknown time  . BREO ELLIPTA 100-25 MCG/INH AEPB Inhale 1 puff into the lungs daily. (Patient not taking: Reported on 07/26/2017)  90 each 1 Not Taking at Unknown time  . OXYGEN Inhale 3 L into the lungs continuous.    Past Week at Unknown time  . UNABLE TO [facility] bed mattress 1 each 0   . VENTOLIN HFA 108 (90 Base) MCG/ACT inhaler INHALE 2 PUFFS EVERY FOUR HOURS AS NEEDED FOR WHEEZING OR SHORTNESS OF BREATH (Patient not taking: Reported on 07/26/2017) 18 g 5 Not Taking at Unknown time   Scheduled: . aspirin EC  81 mg Oral Daily  . Chlorhexidine Gluconate Cloth  6 each Topical Q0600  . enoxaparin (LOVENOX) injection  40 mg Subcutaneous Daily  . furosemide  40 mg Oral Daily  . insulin aspart  0-9 Units Subcutaneous TID WC  . ipratropium-albuterol  3 mL Nebulization Q6H  . mouth rinse  15 mL Mouth Rinse BID  . methylPREDNISolone (SOLU-MEDROL) injection  40 mg Intravenous Q12H  . metoprolol tartrate  12.5 mg Oral BID  . mupirocin ointment  1 application Nasal BID   Continuous: . aztreonam Stopped (07/28/17 0551)  . vancomycin Stopped (07/28/17 7867)   PRN:  Assesment: She was admitted with acute on chronic hypoxic and hypercapnic respiratory failure.  She had systemic inflammatory response syndrome and that has resolved.  She is still requiring high flow nasal cannula.  She looks somewhat  short of breath with conversation but I have not seen her before so I do not know what her baseline he has.  She is somewhat tachycardic also. Active Problems:   Acute respiratory failure (HCC)   Sepsis ([place])   Goals of care, counseling/discussion   DNR (do not resuscitate) discussion   Palliative care by specialist    Plan: Continue current treatments    LOS: 2 days   Delrico Minehart L 07/28/2017, 8:57 AM

## 2017-07-29 LAB — GLUCOSE, CAPILLARY
GLUCOSE-CAPILLARY: 115 mg/dL — AB (ref 70–99)
GLUCOSE-CAPILLARY: 126 mg/dL — AB (ref 70–99)
Glucose-Capillary: 152 mg/dL — ABNORMAL HIGH (ref 70–99)
Glucose-Capillary: 133 mg/dL — ABNORMAL HIGH (ref 70–99)

## 2017-07-29 MED ORDER — DOXYCYCLINE HYCLATE 100 MG PO TABS
100.0000 mg | ORAL_TABLET | Freq: Two times a day (BID) | ORAL | Status: DC
  Administered 2017-07-29 – 2017-07-30 (×3): 100 mg via ORAL
  Filled 2017-07-29 (×3): qty 1

## 2017-07-29 MED ORDER — PREDNISONE 20 MG PO TABS
40.0000 mg | ORAL_TABLET | Freq: Every day | ORAL | Status: DC
  Administered 2017-07-30: 40 mg via ORAL
  Filled 2017-07-29: qty 2

## 2017-07-29 MED ORDER — IPRATROPIUM-ALBUTEROL 0.5-2.5 (3) MG/3ML IN SOLN: 3.0000 mL | Freq: Four times a day (QID) | RESPIRATORY_TRACT | Status: DC | PRN

## 2017-07-29 NOTE — Progress Notes (Signed)
Palliative: Call to social worker Dorathy Daft, unable to reach on the first call. Kayla, SW states they have many sick patients in the facility and are investigating causes.   Advised that Ms. Venerable may be returning tomorrow.  No charge Lillia Carmel, NP Palliative Medicine Team Team Phone # [phone]

## 2017-07-29 NOTE — Progress Notes (Signed)
Subjective: She says she feels okay.  She cannot tell me whether she is at baseline or not.  She seems a little confused.  Objective: Vital signs in last 24 hours: Temp:  [97.5 F (36.4 C)-98.3 F (36.8 C)] 98.3 F (36.8 C) (07/05 0500) Pulse Rate:  [68-108] 86 (07/05 0500) Resp:  [18-22] 19 (07/05 0500) BP: (146-167)/(85-89) 146/88 (07/05 0500) SpO2:  [94 %-98 %] 94 % (07/05 0737) Weight change:     Intake/Output from previous day: 07/04 0701 - 07/05 0700 In: 1607.5 [P.O.:480; IV Piggyback:1127.5] Out: 2100 [Urine:2100]  PHYSICAL EXAM General appearance: alert, cooperative and no distress Resp: rhonchi bilaterally Cardio: regular rate and rhythm, S1, S2 normal, no murmur, click, rub or gallop GI: soft, non-tender; bowel sounds normal; no masses,  no organomegaly Extremities: extremities normal, atraumatic, no cyanosis or edema  Lab Results:  Results for orders placed or performed during the hospital encounter of 07/26/17 (from the past 48 hour(s))  Glucose, capillary     Status: Abnormal   Collection Time: 07/27/17 11:01 AM  Result Value Ref Range   Glucose-Capillary 114 (H) 70 - 99 mg/dL  Glucose, capillary     Status: Abnormal   Collection Time: 07/27/17  5:05 PM  Result Value Ref Range   Glucose-Capillary 150 (H) 70 - 99 mg/dL  Glucose, capillary     Status: Abnormal   Collection Time: 07/27/17  9:25 PM  Result Value Ref Range   Glucose-Capillary 147 (H) 70 - 99 mg/dL  CBC     Status: Abnormal   Collection Time: 07/28/17  4:02 AM  Result Value Ref Range   WBC 11.6 (H) 4.0 - 10.5 K/uL   RBC 3.71 (L) 3.87 - 5.11 MIL/uL   Hemoglobin 9.9 (L) 12.0 - 15.0 g/dL   HCT 32.8 (L) 36.0 - 46.0 %   MCV 88.4 78.0 - 100.0 fL   MCH 26.7 26.0 - 34.0 pg   MCHC 30.2 30.0 - 36.0 g/dL   RDW 14.8 11.5 - 15.5 %   Platelets 255 150 - 400 K/uL    Comment: Performed at [facility], [address] [place], [place] [address]  Basic metabolic panel     Status: Abnormal   Collection  Time: 07/28/17  4:02 AM  Result Value Ref Range   Sodium 141 135 - 145 mmol/L   Potassium 3.9 3.5 - 5.1 mmol/L   Chloride 102 98 - 111 mmol/L    Comment: Please note change in reference range.   CO2 32 22 - 32 mmol/L   Glucose, Bld 156 (H) 70 - 99 mg/dL    Comment: Please note change in reference range.   BUN 27 (H) 8 - 23 mg/dL    Comment: Please note change in reference range.   Creatinine, Ser 0.87 0.44 - 1.00 mg/dL   Calcium 9.6 8.9 - 10.3 mg/dL   GFR calc non Af Amer >60 >60 mL/min   GFR calc Af Amer >60 >60 mL/min    Comment: (NOTE) The eGFR has been calculated using the CKD EPI equation. This calculation has not been validated in all clinical situations. eGFR's persistently <60 mL/min signify possible Chronic Kidney Disease.    Anion gap 7 5 - 15    Comment: Performed at [facility], [address] [place], [place] [address]  Glucose, capillary     Status: Abnormal   Collection Time: 07/28/17  7:50 AM  Result Value Ref Range   Glucose-Capillary 172 (H) 70 - 99 mg/dL  Glucose,  capillary     Status: Abnormal   Collection Time: 07/28/17 11:39 AM  Result Value Ref Range   Glucose-Capillary 195 (H) 70 - 99 mg/dL   Comment 1 Notify RN    Comment 2 Document in Chart   Glucose, capillary     Status: Abnormal   Collection Time: 07/28/17  5:04 PM  Result Value Ref Range   Glucose-Capillary 134 (H) 70 - 99 mg/dL   Comment 1 Notify RN    Comment 2 Document in Chart   Glucose, capillary     Status: Abnormal   Collection Time: 07/28/17 10:01 PM  Result Value Ref Range   Glucose-Capillary 162 (H) 70 - 99 mg/dL   Comment 1 Notify RN    Comment 2 Document in Chart   Glucose, capillary     Status: Abnormal   Collection Time: 07/29/17  9:00 AM  Result Value Ref Range   Glucose-Capillary 152 (H) 70 - 99 mg/dL    ABGS No results for input(s): PHART, PO2ART, TCO2, HCO3 in the last 72 hours.  Invalid input(s): PCO2 CULTURES Recent Results (from the past 240 hour(s))  Blood  Culture (routine x 2)     Status: None (Preliminary result)   Collection Time: 07/26/17  4:34 AM  Result Value Ref Range Status   Specimen Description   Final    RIGHT ANTECUBITAL Performed at [facility], [address], [place], [place] [address]    Special Requests   Final    BOTTLES DRAWN AEROBIC AND ANAEROBIC Blood Culture adequate volume Performed at [facility], [address] [place], [place] [address]    Culture  Setup Time   Final    GRAM POSITIVE COCCI IN BOTH AEROBIC AND ANAEROBIC BOTTLES Gram Stain Report Called to,Read Back By and Verified With: JD, RN @ 2107 ON 7.2.19 BY L.BOWMAN FOR ANA BOTTLE R.WAGONER @ 2045 ON 7.2.19 BY L.BOWMAN FOR AEB BOTTLE CRITICAL RESULT CALLED TO, READ BACK BY AND VERIFIED WITHLynwood Dawley RN 07/27/17 0049 JDW    Culture   Final    Lonell Grandchild POSITIVE COCCI IDENTIFICATION TO FOLLOW Performed at [facility], [place] [address] [place], [place] [address]    Report Status PENDING  Incomplete  Blood Culture ID Panel (Reflexed)     Status: Abnormal   Collection Time: 07/26/17  4:34 AM  Result Value Ref Range Status   Enterococcus species NOT DETECTED NOT DETECTED Final   Listeria monocytogenes NOT DETECTED NOT DETECTED Final   Staphylococcus species DETECTED (A) NOT DETECTED Final    Comment: Methicillin (oxacillin) susceptible coagulase negative staphylococcus. Possible blood culture contaminant (unless isolated from more than one blood culture draw or clinical case suggests pathogenicity). No antibiotic treatment is indicated for blood  culture contaminants. CRITICAL RESULT CALLED TO, READ BACK BY AND VERIFIED WITH: R Jacqualyn Posey RN 07/27/17 0049 JDW    Staphylococcus aureus NOT DETECTED NOT DETECTED Final   Methicillin resistance NOT DETECTED NOT DETECTED Final   Streptococcus species DETECTED (A) NOT DETECTED Final    Comment: CRITICAL RESULT CALLED TO, READ BACK BY AND VERIFIED WITH: Lynwood Dawley RN 07/27/17 0049 JDW    Streptococcus agalactiae  DETECTED (A) NOT DETECTED Final    Comment: CRITICAL RESULT CALLED TO, READ BACK BY AND VERIFIED WITH: Lynwood Dawley RN 07/27/17 0049 JDW    Streptococcus pneumoniae NOT DETECTED NOT DETECTED Final   Streptococcus pyogenes NOT DETECTED NOT DETECTED Final   Acinetobacter baumannii NOT DETECTED NOT DETECTED Final   Enterobacteriaceae  species NOT DETECTED NOT DETECTED Final   Enterobacter cloacae complex NOT DETECTED NOT DETECTED Final   Escherichia coli NOT DETECTED NOT DETECTED Final   Klebsiella oxytoca NOT DETECTED NOT DETECTED Final   Klebsiella pneumoniae NOT DETECTED NOT DETECTED Final   Proteus species NOT DETECTED NOT DETECTED Final   Serratia marcescens NOT DETECTED NOT DETECTED Final   Haemophilus influenzae NOT DETECTED NOT DETECTED Final   Neisseria meningitidis NOT DETECTED NOT DETECTED Final   Pseudomonas aeruginosa NOT DETECTED NOT DETECTED Final   Candida albicans NOT DETECTED NOT DETECTED Final   Candida glabrata NOT DETECTED NOT DETECTED Final   Candida krusei NOT DETECTED NOT DETECTED Final   Candida parapsilosis NOT DETECTED NOT DETECTED Final   Candida tropicalis NOT DETECTED NOT DETECTED Final  Blood Culture (routine x 2)     Status: None (Preliminary result)   Collection Time: 07/26/17  5:07 AM  Result Value Ref Range Status   Specimen Description BLOOD LEFT HAND  Final   Special Requests   Final    BOTTLES DRAWN AEROBIC AND ANAEROBIC Blood Culture adequate volume   Culture  Setup Time PENDING  Incomplete   Culture   Final    NO GROWTH 3 DAYS Performed at [facility], [address], [place], [place] [address]    Report Status PENDING  Incomplete  Culture, Urine     Status: None   Collection Time: 07/26/17  6:32 AM  Result Value Ref Range Status   Specimen Description   Final    URINE, RANDOM Performed at [facility], [address], [place], [place] [address]    Special Requests   Final    NONE Performed at [facility], [address], [place], [place]  [address]    Culture   Final    NO GROWTH Performed at [facility], [place] [address] [place], [place] [address]    Report Status 07/27/2017 FINAL  Final  MRSA PCR Screening     Status: Abnormal   Collection Time: 07/26/17  8:31 AM  Result Value Ref Range Status   MRSA by PCR POSITIVE (A) NEGATIVE Final    Comment:        The GeneXpert MRSA Assay (FDA approved for NASAL specimens only), is one component of a comprehensive MRSA colonization surveillance program. It is not intended to diagnose MRSA infection nor to guide or monitor treatment for MRSA infections. RESULT CALLED TO, READ BACK BY AND VERIFIED WITH: MURPHY E. AT 1012A ON [id number] BY THOMPSON S. Performed at [facility], [address], [place], [place] [address]    Studies/Results: No results found.  Medications:  Prior to Admission:  Medications Prior to Admission  Medication Sig Dispense Refill Last Dose  . budesonide-formoterol (SYMBICORT) 160-4.5 MCG/ACT inhaler Inhale 2 puffs into the lungs 2 (two) times daily.   07/25/2017 at Unknown time  . busPIRone (BUSPAR) 15 MG tablet TAKE 1 TABLET BY MOUTH TWICE DAILY 180 tablet 1 07/25/2017 at Unknown time  . cholecalciferol (VITAMIN D) 1000 units tablet Take 1,000 Units by mouth daily.   07/25/2017 at Unknown time  . dextromethorphan-guaiFENesin (MUCINEX DM) 30-600 MG 12hr tablet Take 1 tablet by mouth 2 (two) times daily as needed for cough. 30 tablet 0 07/25/2017 at Unknown time  . furosemide (LASIX) 20 MG tablet Take 2 tablets (40 mg total) by mouth daily. 60 tablet 0 07/25/2017 at Unknown time  . ipratropium-albuterol (DUONEB) 0.5-2.5 (3) MG/3ML SOLN Take 3 mLs by nebulization every 4 (  four) hours. 360 mL 5 07/25/2017 at Unknown time  . Melatonin-Pyridoxine (MELATIN) 3-1 MG TABS Take 6 mg by mouth at bedtime.   Past Week at Unknown time  . metoprolol tartrate (LOPRESSOR) 25 MG tablet Take 0.5 tablets (12.5 mg total) by mouth 2 (two) times daily. 30 tablet 0 07/25/2017 at 800  .  Multiple Vitamin (MULTIVITAMIN WITH MINERALS) TABS tablet Take 1 tablet by mouth daily.   07/25/2017 at Unknown time  . potassium chloride (K-DUR) 10 MEQ tablet Take 1 tablet (10 mEq total) by mouth daily. 90 tablet 3 07/25/2017 at Unknown time  . simvastatin (ZOCOR) 10 MG tablet Take 10 mg by mouth daily.   07/25/2017 at Unknown time  . tiotropium (SPIRIVA) 18 MCG inhalation capsule Place 18 mcg into inhaler and inhale daily.   07/25/2017 at Unknown time  . traMADol (ULTRAM) 50 MG tablet Take 1 tablet (50 mg total) by mouth every 6 (six) hours as needed for moderate pain or severe pain. 30 tablet 0 Past Week at Unknown time  . BREO ELLIPTA 100-25 MCG/INH AEPB Inhale 1 puff into the lungs daily. (Patient not taking: Reported on 07/26/2017) 90 each 1 Not Taking at Unknown time  . OXYGEN Inhale 3 L into the lungs continuous.    Past Week at Unknown time  . UNABLE TO [facility] bed mattress 1 each 0   . VENTOLIN HFA 108 (90 Base) MCG/ACT inhaler INHALE 2 PUFFS EVERY FOUR HOURS AS NEEDED FOR WHEEZING OR SHORTNESS OF BREATH (Patient not taking: Reported on 07/26/2017) 18 g 5 Not Taking at Unknown time   Scheduled: . aspirin EC  81 mg Oral Daily  . Chlorhexidine Gluconate Cloth  6 each Topical Q0600  . enoxaparin (LOVENOX) injection  40 mg Subcutaneous Daily  . feeding supplement  1 Container Oral TID BM  . furosemide  40 mg Oral Daily  . insulin aspart  0-9 Units Subcutaneous TID WC  . ipratropium-albuterol  3 mL Nebulization Q6H  . mouth rinse  15 mL Mouth Rinse BID  . methylPREDNISolone (SOLU-MEDROL) injection  40 mg Intravenous Q12H  . metoprolol tartrate  12.5 mg Oral BID  . mupirocin ointment  1 application Nasal BID   Continuous: . aztreonam Stopped (07/29/17 0520)  . vancomycin Stopped (07/29/17 0631)   GBT:[id number]  Assesment: She was admitted with acute on chronic hypoxic and hypercapnic respiratory failure that required BiPAP.  She is definitely better.  At baseline she has COPD and she is  being treated for COPD exacerbation as well.  On admission she had systemic inflammatory response syndrome that has resolved  I think she is probably approaching baseline but I have not seen her before so I cannot be sure of that.  Unfortunately hospitalist attending Dr. Manuella Ghazi has also never seen her before and she does not seem to be able to tell us where she is compared to her baseline Active Problems:   Acute respiratory failure ([place])   Sepsis ([place])   Goals of care, counseling/discussion   DNR (do not resuscitate) discussion   Palliative care by specialist    Plan: Discussed with Dr. Manuella Ghazi agree with his plan to get her to ambulate and see how she does with oxygenation and try to talk to family about how close she is to baseline    LOS: 3 days   Donavyn Fecher L 07/29/2017, 9:05 AM

## 2017-07-29 NOTE — Clinical Social Work Note (Signed)
CSW following with daily chart review and discussion in Progression. Per MD, pt may be stable for return to [facility] by tomorrow or Sunday. Updated Dorathy Daft at [facility] by phone. She states that pt is able to return over the weekend.   Weekend CSW will be available to assist with pt's dc needs.

## 2017-07-29 NOTE — Progress Notes (Signed)
PROGRESS NOTE    [name]  MKL:[id number] DOB: 20-Oct-1945 DOA: 07/26/2017 PCP: Patient, No Pcp Per   Brief Narrative:    Sheila Pierce  is a 73 y.o. female, with history of chronic diastolic CHF grade 1 on 11/2016, chronic respiratory failure on home O2 3 L/min with COPD,GERD, hypertension who was brought to the hospital for worsening shortness of breath.  In the ED patient was put on BiPAP.  She also was found to have fever with T-max 103.8.  She was noted to have leukocytosis of 21,900 and lactic acid of 1.6 with clear chest x-ray.  She was empirically initiated on Zosyn and vancomycin per sepsis protocol.  Blood cultures thus far have shown some contaminant with coagulase-negative staph and some strep.   Assessment & Plan:   Active Problems:   Acute respiratory failure (HCC)   Sepsis (HCC)   Goals of care, counseling/discussion   DNR (do not resuscitate) discussion   Palliative care by specialist   1. Acute on chronic hypoxemic and hypercapnic respiratory failure secondary to COPD exacerbation-improving and near baseline.  Patient does wear 3 L nasal cannula at [facility].  Per Pulmonology, pt appears to be doing better, but unsure of baseline. Will transition to oral prednisone today 40mg  as she is having some agitation and confusion likely from higher steroid doses. Pt is non-ambulatory. 2. Sepsis of undetermined etiology.  Possible pneumonia-but CXR WNL. Blood cultures only with contaminant noted in 1 set with staph and strep. Will DC Azactam and Vanc today and initiate doxycycline. She is currently day 4/7 on abx treatment. 3. AKI-resolved.  Baseline creatinine is 0.89.  This is resolved.  Only slightly positive fluid balance. Avoid nephrotoxic agents. Repeat renal panel in am. 4. Chronic diastolic heart failure.  Monitor daily weights.  Currently with positive FB. Resume home lasix with IVF DC. 5. Hypertension.  Continue on Lopressor with current dose and consider increasing to  25mg  bid if persistently tachycardic-once bronchospasms resolved.  DVT prophylaxis:Lovenox Code Status: DNR Family Communication: Discussed plan with son at bedside Disposition Plan: Continue treatment for COPD exacerbation with oral prednisone and doxycycline initiated today. Likely DC back to [facility] by AM if stable on her usual 3L [place].   Consultants:   Pulmonology-Dr. Juanetta Gosling  Palliative Rachel Moulds  Procedures:   None  Antimicrobials:   Vancomycin and aztreonam 7/2->7/5  Doxycycline 7/5->   Subjective: Patient seen and evaluated today with no new acute complaints or concerns. No acute concerns or events noted overnight.  She appears intermittently confused, but son states that this is not unusual for her. She is not sure if she is fully improved as of yet and does become somewhat short of breath with positioning even though she is maintaining good O2 saturations at her baseline of 3L [place].  Objective: Vitals:   07/29/17 0210 07/29/17 0500 07/29/17 0737 07/29/17 1032  BP:  (!) 146/88  (!) 175/91  Pulse:  86    Resp:  19    Temp:  98.3 F (36.8 C)    TempSrc:  Oral    SpO2: 96% 98% 94%   Weight:      Height:        Intake/Output Summary (Last 24 hours) at 07/29/2017 1204 Last data filed at 07/29/2017 0631 Gross per 24 hour  Intake 1607.5 ml  Output 1500 ml  Net 107.5 ml   Filed Weights   07/26/17 0429 07/26/17 0813 07/28/17 0500  Weight: 63 kg (139 lb)  61.7 kg (136 lb 0.4 oz) 62.8 kg (138 lb 7.2 oz)    Examination:  General exam: Appears calm and comfortable; minimally confused. Respiratory system: Clear to auscultation. Respiratory effort normal.  Currently on 3L [place]. Cardiovascular system: S1 & S2 heard, RRR. No JVD, murmurs, rubs, gallops or clicks. No pedal edema. Gastrointestinal system: Abdomen is nondistended, soft and nontender. No organomegaly or masses felt. Normal bowel sounds heard. Central nervous system:Cannot be assessed  appropriately. Skin: No rashes, lesions or ulcers     Data Reviewed: I have personally reviewed following labs and imaging studies  CBC: Recent Labs  Lab 07/26/17 0434 07/27/17 0531 07/28/17 0402  WBC 21.9* 11.5* 11.6*  NEUTROABS 17.8*  --   --   HGB 13.0 10.8* 9.9*  HCT 41.2 35.2* 32.8*  MCV 87.5 88.0 88.4  PLT 338 207 255   Basic Metabolic Panel: Recent Labs  Lab 07/26/17 0434 07/27/17 0531 07/28/17 0402  NA 135 140 141  K 4.7 4.1 3.9  CL 88* 100 102  CO2 34* 28 32  GLUCOSE 188* 144* 156*  BUN 26* 34* 27*  CREATININE 1.82* 1.15* 0.87  CALCIUM 9.7 9.5 9.6   GFR: Estimated Creatinine Clearance: 48.4 mL/min (by C-G formula based on SCr of 0.87 mg/dL). Liver Function Tests: Recent Labs  Lab 07/26/17 0434 07/27/17 0531  AST 31 22  ALT 26 21  ALKPHOS 74 57  BILITOT 1.2 0.8  PROT 8.7* 7.2  ALBUMIN 3.8 3.0*   No results for input(s): LIPASE, AMYLASE in the last 168 hours. No results for input(s): AMMONIA in the last 168 hours. Coagulation Profile: No results for input(s): INR, PROTIME in the last 168 hours. Cardiac Enzymes: No results for input(s): CKTOTAL, CKMB, CKMBINDEX, TROPONINI in the last 168 hours. BNP (last 3 results) No results for input(s): PROBNP in the last 8760 hours. HbA1C: No results for input(s): HGBA1C in the last 72 hours. CBG: Recent Labs  Lab 07/28/17 0750 07/28/17 1139 07/28/17 1704 07/28/17 2201 07/29/17 0900  GLUCAP 172* 195* 134* 162* 152*   Lipid Profile: No results for input(s): CHOL, HDL, LDLCALC, TRIG, CHOLHDL, LDLDIRECT in the last 72 hours. Thyroid Function Tests: No results for input(s): TSH, T4TOTAL, FREET4, T3FREE, THYROIDAB in the last 72 hours. Anemia Panel: No results for input(s): VITAMINB12, FOLATE, FERRITIN, TIBC, IRON, RETICCTPCT in the last 72 hours. Sepsis Labs: Recent Labs  Lab 07/26/17 0444 07/26/17 0831  LATICACIDVEN 1.61 0.9    Recent Results (from the past 240 hour(s))  Blood Culture (routine  x 2)     Status: Abnormal (Preliminary result)   Collection Time: 07/26/17  4:34 AM  Result Value Ref Range Status   Specimen Description   Final    RIGHT ANTECUBITAL Performed at [facility], [address], [place], [place] [address]    Special Requests   Final    BOTTLES DRAWN AEROBIC AND ANAEROBIC Blood Culture adequate volume Performed at [facility], [address] [place], [place] [address]    Culture  Setup Time   Final    GRAM POSITIVE COCCI IN BOTH AEROBIC AND ANAEROBIC BOTTLES Gram Stain Report Called to,Read Back By and Verified With: JD, RN @ 2107 ON 7.2.19 BY L.BOWMAN FOR ANA BOTTLE R.WAGONER @ 2045 ON 7.2.19 BY L.BOWMAN FOR AEB BOTTLE CRITICAL RESULT CALLED TO, READ BACK BY AND VERIFIED WITH: Ardis Rowan RN 07/27/17 0049 JDW    Culture (A)  Final    STAPHYLOCOCCUS SPECIES (COAGULASE NEGATIVE) THE SIGNIFICANCE OF ISOLATING THIS ORGANISM  FROM A SINGLE SET OF BLOOD CULTURES WHEN MULTIPLE SETS ARE DRAWN IS UNCERTAIN. PLEASE NOTIFY THE MICROBIOLOGY DEPARTMENT WITHIN ONE WEEK IF SPECIATION AND SENSITIVITIES ARE REQUIRED. CULTURE REINCUBATED FOR BETTER GROWTH Performed at [facility], [address], [place], [place] [address]    Report Status PENDING  Incomplete  Blood Culture ID Panel (Reflexed)     Status: Abnormal   Collection Time: 07/26/17  4:34 AM  Result Value Ref Range Status   Enterococcus species NOT DETECTED NOT DETECTED Final   Listeria monocytogenes NOT DETECTED NOT DETECTED Final   Staphylococcus species DETECTED (A) NOT DETECTED Final    Comment: Methicillin (oxacillin) susceptible coagulase negative staphylococcus. Possible blood culture contaminant (unless isolated from more than one blood culture draw or clinical case suggests pathogenicity). No antibiotic treatment is indicated for blood  culture contaminants. CRITICAL RESULT CALLED TO, READ BACK BY AND VERIFIED WITH: R Ardelle Anton RN 07/27/17 0049 JDW    Staphylococcus aureus NOT DETECTED NOT DETECTED  Final   Methicillin resistance NOT DETECTED NOT DETECTED Final   Streptococcus species DETECTED (A) NOT DETECTED Final    Comment: CRITICAL RESULT CALLED TO, READ BACK BY AND VERIFIED WITH: Ardis Rowan RN 07/27/17 0049 JDW    Streptococcus agalactiae DETECTED (A) NOT DETECTED Final    Comment: CRITICAL RESULT CALLED TO, READ BACK BY AND VERIFIED WITH: R WAGONER RN 07/27/17 0049 JDW    Streptococcus pneumoniae NOT DETECTED NOT DETECTED Final   Streptococcus pyogenes NOT DETECTED NOT DETECTED Final   Acinetobacter baumannii NOT DETECTED NOT DETECTED Final   Enterobacteriaceae species NOT DETECTED NOT DETECTED Final   Enterobacter cloacae complex NOT DETECTED NOT DETECTED Final   Escherichia coli NOT DETECTED NOT DETECTED Final   Klebsiella oxytoca NOT DETECTED NOT DETECTED Final   Klebsiella pneumoniae NOT DETECTED NOT DETECTED Final   Proteus species NOT DETECTED NOT DETECTED Final   Serratia marcescens NOT DETECTED NOT DETECTED Final   Haemophilus influenzae NOT DETECTED NOT DETECTED Final   Neisseria meningitidis NOT DETECTED NOT DETECTED Final   Pseudomonas aeruginosa NOT DETECTED NOT DETECTED Final   Candida albicans NOT DETECTED NOT DETECTED Final   Candida glabrata NOT DETECTED NOT DETECTED Final   Candida krusei NOT DETECTED NOT DETECTED Final   Candida parapsilosis NOT DETECTED NOT DETECTED Final   Candida tropicalis NOT DETECTED NOT DETECTED Final  Blood Culture (routine x 2)     Status: None (Preliminary result)   Collection Time: 07/26/17  5:07 AM  Result Value Ref Range Status   Specimen Description BLOOD LEFT HAND  Final   Special Requests   Final    BOTTLES DRAWN AEROBIC AND ANAEROBIC Blood Culture adequate volume   Culture  Setup Time PENDING  Incomplete   Culture   Final    NO GROWTH 3 DAYS Performed at [facility], [address] [place], [place] [address]    Report Status PENDING  Incomplete  Culture, Urine     Status: None   Collection Time: 07/26/17  6:32 AM   Result Value Ref Range Status   Specimen Description   Final    URINE, RANDOM Performed at [facility], [address], [place], [place] [address]    Special Requests   Final    NONE Performed at [facility], [address], [place], [place] [address]    Culture   Final    NO GROWTH Performed at [facility], [address] [place], [place] [address]    Report  Status 07/27/2017 FINAL  Final  MRSA PCR Screening     Status: Abnormal   Collection Time: 07/26/17  8:31 AM  Result Value Ref Range Status   MRSA by PCR POSITIVE (A) NEGATIVE Final    Comment:        The GeneXpert MRSA Assay (FDA approved for NASAL specimens only), is one component of a comprehensive MRSA colonization surveillance program. It is not intended to diagnose MRSA infection nor to guide or monitor treatment for MRSA infections. RESULT CALLED TO, READ BACK BY AND VERIFIED WITH: MURPHY E. AT 1012A ON [id number] BY THOMPSON S. Performed at [facility], [address] [place], [place] [address]          Radiology Studies: No results found.      Scheduled Meds: . aspirin EC  81 mg Oral Daily  . Chlorhexidine Gluconate Cloth  6 each Topical Q0600  . doxycycline  100 mg Oral Q12H  . enoxaparin (LOVENOX) injection  40 mg Subcutaneous Daily  . feeding supplement  1 Container Oral TID BM  . furosemide  40 mg Oral Daily  . insulin aspart  0-9 Units Subcutaneous TID WC  . ipratropium-albuterol  3 mL Nebulization Q6H  . mouth rinse  15 mL Mouth Rinse BID  . methylPREDNISolone (SOLU-MEDROL) injection  40 mg Intravenous Q12H  . metoprolol tartrate  12.5 mg Oral BID  . mupirocin ointment  1 application Nasal BID   Continuous Infusions:    LOS: 3 days    Time spent: 30 minutes    Charan Prieto Hoover Brunette, DO [facility] Pager [phone]  If 7PM-7AM, please contact night-coverage [email or web address] Password [email or web address] 07/29/2017, 12:04 PM

## 2017-07-30 DIAGNOSIS — A419 Sepsis, unspecified organism: Principal | ICD-10-CM

## 2017-07-30 DIAGNOSIS — R1311 Dysphagia, oral phase: Secondary | ICD-10-CM | POA: Diagnosis not present

## 2017-07-30 DIAGNOSIS — J9601 Acute respiratory failure with hypoxia: Secondary | ICD-10-CM

## 2017-07-30 DIAGNOSIS — J9620 Acute and chronic respiratory failure, unspecified whether with hypoxia or hypercapnia: Secondary | ICD-10-CM | POA: Diagnosis not present

## 2017-07-30 DIAGNOSIS — J441 Chronic obstructive pulmonary disease with (acute) exacerbation: Secondary | ICD-10-CM

## 2017-07-30 DIAGNOSIS — J189 Pneumonia, unspecified organism: Secondary | ICD-10-CM | POA: Diagnosis present

## 2017-07-30 LAB — GLUCOSE, CAPILLARY
GLUCOSE-CAPILLARY: 144 mg/dL — AB (ref 70–99)
Glucose-Capillary: 89 mg/dL (ref 70–99)
Glucose-Capillary: 152 mg/dL — ABNORMAL HIGH (ref 70–99)

## 2017-07-30 LAB — CBC
HCT: 37.9 % (ref 36.0–46.0)
Hemoglobin: 11.6 g/dL — ABNORMAL LOW (ref 12.0–15.0)
MCH: 27.4 pg (ref 26.0–34.0)
MCHC: 30.6 g/dL (ref 30.0–36.0)
MCV: 89.6 fL (ref 78.0–100.0)
PLATELETS: 212 10*3/uL (ref 150–400)
RBC: 4.23 MIL/uL (ref 3.87–5.11)
RDW: 14.6 % (ref 11.5–15.5)
WBC: 7 10*3/uL (ref 4.0–10.5)

## 2017-07-30 LAB — BASIC METABOLIC PANEL
ANION GAP: 13 (ref 5–15)
BUN: 22 mg/dL (ref 8–23)
CO2: 36 mmol/L — ABNORMAL HIGH (ref 22–32)
CREATININE: 0.79 mg/dL (ref 0.44–1.00)
Calcium: 9 mg/dL (ref 8.9–10.3)
Chloride: 91 mmol/L — ABNORMAL LOW (ref 98–111)
Glucose, Bld: 102 mg/dL — ABNORMAL HIGH (ref 70–99)
Potassium: 3.2 mmol/L — ABNORMAL LOW (ref 3.5–5.1)
SODIUM: 140 mmol/L (ref 135–145)

## 2017-07-30 LAB — CULTURE, BLOOD (ROUTINE X 2): SPECIAL REQUESTS: ADEQUATE

## 2017-07-30 MED ORDER — PREDNISONE 10 MG PO TABS: ORAL_TABLET | ORAL | Status: DC

## 2017-07-30 MED ORDER — POTASSIUM CHLORIDE CRYS ER 20 MEQ PO TBCR
40.0000 meq | EXTENDED_RELEASE_TABLET | Freq: Once | ORAL | Status: AC
  Administered 2017-07-30: 40 meq via ORAL
  Filled 2017-07-30: qty 2

## 2017-07-30 MED ORDER — ASPIRIN EC 81 MG PO TBEC: 81.0000 mg | DELAYED_RELEASE_TABLET | Freq: Every day | ORAL | Status: DC

## 2017-07-30 MED ORDER — DOXYCYCLINE HYCLATE 100 MG PO TABS: 100.0000 mg | ORAL_TABLET | Freq: Two times a day (BID) | ORAL | 0 refills | Status: AC

## 2017-07-30 NOTE — Progress Notes (Signed)
Report called to Sheila Pierce, nurse at [facility] and all questions answered. IVs removed and 2x2 gauze and paper tape applied to site, patient tolerated well. Patient awaiting [facility] arrival to transport to facility.

## 2017-07-30 NOTE — Discharge Summary (Addendum)
Physician Discharge Summary  Sheila Pierce ZOX:[id number] DOB: 02/17/1945 DOA: 07/26/2017  PCP: Patient, No Pcp Per  Admit date: 07/26/2017 Discharge date: 07/30/2017  Admitted From: Skilled nursing facility Disposition: Skilled nursing facility  Recommendations for Outpatient Follow-up:  1. Follow up with PCP in 1-2 weeks 2. Please obtain BMP/CBC in one week  Discharge Condition: Stable CODE STATUS: DNR Diet recommendation: Heart healthy  Brief/Interim Summary: 72 year old female with a history of chronic diastolic congestive heart failure, chronic respiratory failure on home oxygen, COPD, GERD, hypertension, was brought to the hospital with worsening shortness of breath.  She is known to have COPD exacerbation as well as pneumonia.  She was initially placed on BiPAP and treated with intravenous antibiotics with vancomycin and Azactam.  She was found to have 1 out of 2 positive blood cultures for coagulase-negative staph that was felt to be a contaminant.  She was treated with bronchodilators, intravenous steroids and antibiotics.  She was followed by pulmonology.  She was eventually weaned off the BiPAP and was started back on nasal cannula.  She is since on her baseline oxygen requirement.  Steroids have been transitioned to prednisone taper.  Heart failure remained stable throughout hospital stay.  She was seen by palliative care to discuss goals of care.  She agreed to DNR, but would like to be readmitted to the hospital at this time if her condition worsens.  It appears that she has quite advanced lung disease at baseline and is nonambulatory.  Would recommend continued conversations regarding goals of care at nursing facility.  Patient is felt to be likely approaching her baseline and is felt stable for discharge back to nursing home today.  Discharge Diagnoses:  Active Problems:   Essential hypertension   Diabetes mellitus, stable (HCC)   Chronic diastolic CHF (congestive heart failure)  (HCC)   GERD (gastroesophageal reflux disease)   COPD with acute exacerbation (HCC)   Acute respiratory failure (HCC)   Sepsis (HCC)   Goals of care, counseling/discussion   DNR (do not resuscitate) discussion   Palliative care by specialist   HCAP (healthcare-associated pneumonia)    Discharge Instructions  Discharge Instructions    Diet - low sodium heart healthy   Complete by:  As directed    Increase activity slowly   Complete by:  As directed      Allergies as of 07/30/2017      Reactions   Penicillins Swelling, Other (See Comments)   Reaction:  Unspecified swelling reaction Has patient had a PCN reaction causing immediate rash, facial/tongue/throat swelling, SOB or lightheadedness with hypotension: Yes Has patient had a PCN reaction causing severe rash involving mucus membranes or skin necrosis: No Has patient had a PCN reaction that required hospitalization No Has patient had a PCN reaction occurring within the last 10 years: Yes If all of the above answers are "NO", then may proceed with Cephalosporin use.   Sulfa Antibiotics Hives, Other (See Comments)   Reaction:  Hallucinations   Codeine Nausea Only   Hydrocodone Nausea Only   Levaquin [levofloxacin] Itching, Swelling, Rash      Medication List    STOP taking these medications   tiotropium 18 MCG inhalation capsule Commonly known as:  SPIRIVA     TAKE these medications   aspirin EC 81 MG tablet Take 1 tablet (81 mg total) by mouth daily.   BREO ELLIPTA 100-25 MCG/INH Aepb Generic drug:  fluticasone furoate-vilanterol Inhale 1 puff into the lungs daily.   budesonide-formoterol 160-4.5 MCG/ACT  inhaler Commonly known as:  SYMBICORT Inhale 2 puffs into the lungs 2 (two) times daily.   busPIRone 15 MG tablet Commonly known as:  BUSPAR TAKE 1 TABLET BY MOUTH TWICE DAILY   cholecalciferol 1000 units tablet Commonly known as:  VITAMIN D Take 1,000 Units by mouth daily.   dextromethorphan-guaiFENesin  30-600 MG 12hr tablet Commonly known as:  MUCINEX DM Take 1 tablet by mouth 2 (two) times daily as needed for cough.   doxycycline 100 MG tablet Commonly known as:  VIBRA-TABS Take 1 tablet (100 mg total) by mouth every 12 (twelve) hours for 3 days.   furosemide 20 MG tablet Commonly known as:  LASIX Take 2 tablets (40 mg total) by mouth daily.   ipratropium-albuterol 0.5-2.5 (3) MG/3ML Soln Commonly known as:  DUONEB Take 3 mLs by nebulization every 4 (four) hours.   MELATIN 3-1 MG Tabs Generic drug:  Melatonin-Pyridoxine Take 6 mg by mouth at bedtime.   metoprolol tartrate 25 MG tablet Commonly known as:  LOPRESSOR Take 0.5 tablets (12.5 mg total) by mouth 2 (two) times daily.   multivitamin with minerals Tabs tablet Take 1 tablet by mouth daily.   OXYGEN Inhale 3 L into the lungs continuous.   potassium chloride 10 MEQ tablet Commonly known as:  K-DUR Take 1 tablet (10 mEq total) by mouth daily.   predniSONE 10 MG tablet Commonly known as:  DELTASONE Take 40mg  po daily for 2 days then 30mg  daily for 2 days then 20mg  daily for 2 days then 10mg  daily for 2 days then stop   simvastatin 10 MG tablet Commonly known as:  ZOCOR Take 10 mg by mouth daily.   traMADol 50 MG tablet Commonly known as:  ULTRAM Take 1 tablet (50 mg total) by mouth every 6 (six) hours as needed for moderate pain or severe pain.   UNABLE TO FIND Hospital bed mattress   VENTOLIN HFA 108 (90 Base) MCG/ACT inhaler Generic drug:  albuterol INHALE 2 PUFFS EVERY FOUR HOURS AS NEEDED FOR WHEEZING OR SHORTNESS OF BREATH       Allergies  Allergen Reactions  . Penicillins Swelling and Other (See Comments)    Reaction:  Unspecified swelling reaction Has patient had a PCN reaction causing immediate rash, facial/tongue/throat swelling, SOB or lightheadedness with hypotension: Yes Has patient had a PCN reaction causing severe rash involving mucus membranes or skin necrosis: No Has patient had a PCN  reaction that required hospitalization No Has patient had a PCN reaction occurring within the last 10 years: Yes If all of the above answers are "NO", then may proceed with Cephalosporin use.  . Sulfa Antibiotics Hives and Other (See Comments)    Reaction:  Hallucinations   . Codeine Nausea Only  . Hydrocodone Nausea Only  . Levaquin [Levofloxacin] Itching, Swelling and Rash    Consultations:  Pulmonology  Palliative care   Procedures/Studies: Dg Chest Port 1 View  Result Date: 07/27/2017 CLINICAL DATA:  Respiratory failure EXAM: PORTABLE CHEST 1 VIEW COMPARISON:  07/26/2017 FINDINGS: Cardiac shadow is stable. Diffuse interstitial changes are again identified throughout both lungs. Mild bibasilar atelectasis is noted slightly greater on the left than the right. No pneumothorax or effusion is seen. No bony abnormality is noted. IMPRESSION: Bibasilar atelectatic changes as described superimposed over more chronic interstitial change. Electronically Signed   By: Alcide Clever M.D.   On: 07/27/2017 09:14   Dg Chest Port 1 View  Result Date: 07/26/2017 CLINICAL DATA:  72 year old female with respiratory distress  EXAM: PORTABLE CHEST 1 VIEW COMPARISON:  .  Chest radiograph dated 03/18/2017 FINDINGS: There is emphysematous changes of the lungs with diffuse interstitial coarsening. No focal consolidation, pleural effusion, or pneumothorax. The cardiac silhouette is within normal limits. Atherosclerotic calcification of the aorta. No acute osseous pathology. IMPRESSION: 1. No acute cardiopulmonary process. 2. Emphysema. Electronically Signed   By: Elgie Collard M.D.   On: 07/26/2017 04:58      Subjective: No new complaints.  Feels breathing is improving.  Discharge Exam: Vitals:   07/30/17 0637 07/30/17 0645  BP: (!) 181/97 (!) 144/86  Pulse: 89   Resp: 20   Temp: 97.6 F (36.4 C)   SpO2: 99%    Vitals:   07/29/17 2213 07/29/17 2218 07/30/17 0637 07/30/17 0645  BP: (!) 176/91 (!)  160/81 (!) 181/97 (!) 144/86  Pulse: (!) 104  89   Resp: (!) 22  20   Temp: 98.1 F (36.7 C)  97.6 F (36.4 C)   TempSrc: Oral  Oral   SpO2: 97%  99%   Weight:      Height:        General: Pt is alert, awake, not in acute distress Cardiovascular: RRR, S1/S2 +, no rubs, no gallops Respiratory: CTA bilaterally, no wheezing, no rhonchi Abdominal: Soft, NT, ND, bowel sounds + Extremities: no edema, no cyanosis    The results of significant diagnostics from this hospitalization (including imaging, microbiology, ancillary and laboratory) are listed below for reference.     Microbiology: Recent Results (from the past 240 hour(s))  Blood Culture (routine x 2)     Status: Abnormal   Collection Time: 07/26/17  4:34 AM  Result Value Ref Range Status   Specimen Description   Final    RIGHT ANTECUBITAL Performed at [facility], [address], [place], [place] [address]    Special Requests   Final    BOTTLES DRAWN AEROBIC AND ANAEROBIC Blood Culture adequate volume Performed at [facility], [address] [place], [place] [address]    Culture  Setup Time   Final    GRAM POSITIVE COCCI IN BOTH AEROBIC AND ANAEROBIC BOTTLES Gram Stain Report Called to,Read Back By and Verified With: JD, RN @ 2107 ON 7.2.19 BY L.BOWMAN FOR ANA BOTTLE R.WAGONER @ 2045 ON 7.2.19 BY L.BOWMAN FOR AEB BOTTLE CRITICAL RESULT CALLED TO, READ BACK BY AND VERIFIED WITH: Ardis Rowan RN 07/27/17 0049 JDW    Culture (A)  Final    STAPHYLOCOCCUS SPECIES (COAGULASE NEGATIVE) THE SIGNIFICANCE OF ISOLATING THIS ORGANISM FROM A SINGLE SET OF BLOOD CULTURES WHEN MULTIPLE SETS ARE DRAWN IS UNCERTAIN. PLEASE NOTIFY THE MICROBIOLOGY DEPARTMENT WITHIN ONE WEEK IF SPECIATION AND SENSITIVITIES ARE REQUIRED. Performed at [facility], [address] [place], [place] [address]    Report Status 07/30/2017 FINAL  Final  Blood Culture ID Panel (Reflexed)     Status: Abnormal   Collection Time: 07/26/17  4:34 AM  Result Value  Ref Range Status   Enterococcus species NOT DETECTED NOT DETECTED Final   Listeria monocytogenes NOT DETECTED NOT DETECTED Final   Staphylococcus species DETECTED (A) NOT DETECTED Final    Comment: Methicillin (oxacillin) susceptible coagulase negative staphylococcus. Possible blood culture contaminant (unless isolated from more than one blood culture draw or clinical case suggests pathogenicity). No antibiotic treatment is indicated for blood  culture contaminants. CRITICAL RESULT CALLED TO, READ BACK BY AND VERIFIED WITHArdis Rowan RN 07/27/17 0049 JDW    Staphylococcus aureus NOT DETECTED  NOT DETECTED Final   Methicillin resistance NOT DETECTED NOT DETECTED Final   Streptococcus species DETECTED (A) NOT DETECTED Final    Comment: CRITICAL RESULT CALLED TO, READ BACK BY AND VERIFIED WITH: Ardis Rowan RN 07/27/17 0049 JDW    Streptococcus agalactiae DETECTED (A) NOT DETECTED Final    Comment: CRITICAL RESULT CALLED TO, READ BACK BY AND VERIFIED WITH: R WAGONER RN 07/27/17 0049 JDW    Streptococcus pneumoniae NOT DETECTED NOT DETECTED Final   Streptococcus pyogenes NOT DETECTED NOT DETECTED Final   Acinetobacter baumannii NOT DETECTED NOT DETECTED Final   Enterobacteriaceae species NOT DETECTED NOT DETECTED Final   Enterobacter cloacae complex NOT DETECTED NOT DETECTED Final   Escherichia coli NOT DETECTED NOT DETECTED Final   Klebsiella oxytoca NOT DETECTED NOT DETECTED Final   Klebsiella pneumoniae NOT DETECTED NOT DETECTED Final   Proteus species NOT DETECTED NOT DETECTED Final   Serratia marcescens NOT DETECTED NOT DETECTED Final   Haemophilus influenzae NOT DETECTED NOT DETECTED Final   Neisseria meningitidis NOT DETECTED NOT DETECTED Final   Pseudomonas aeruginosa NOT DETECTED NOT DETECTED Final   Candida albicans NOT DETECTED NOT DETECTED Final   Candida glabrata NOT DETECTED NOT DETECTED Final   Candida krusei NOT DETECTED NOT DETECTED Final   Candida parapsilosis NOT DETECTED NOT  DETECTED Final   Candida tropicalis NOT DETECTED NOT DETECTED Final  Blood Culture (routine x 2)     Status: None (Preliminary result)   Collection Time: 07/26/17  5:07 AM  Result Value Ref Range Status   Specimen Description BLOOD LEFT HAND  Final   Special Requests   Final    BOTTLES DRAWN AEROBIC AND ANAEROBIC Blood Culture adequate volume   Culture  Setup Time PENDING  Incomplete   Culture   Final    NO GROWTH 4 DAYS Performed at [facility], [address] [place], [place] [address]    Report Status PENDING  Incomplete  Culture, Urine     Status: None   Collection Time: 07/26/17  6:32 AM  Result Value Ref Range Status   Specimen Description   Final    URINE, RANDOM Performed at [facility], [address] [place], [place] [address]    Special Requests   Final    NONE Performed at [facility], [address], [place], [place] [address]    Culture   Final    NO GROWTH Performed at [facility], [address], [place], [place] [address]    Report Status 07/27/2017 FINAL  Final  MRSA PCR Screening     Status: Abnormal   Collection Time: 07/26/17  8:31 AM  Result Value Ref Range Status   MRSA by PCR POSITIVE (A) NEGATIVE Final    Comment:        The GeneXpert MRSA Assay (FDA approved for NASAL specimens only), is one component of a comprehensive MRSA colonization surveillance program. It is not intended to diagnose MRSA infection nor to guide or monitor treatment for MRSA infections. RESULT CALLED TO, READ BACK BY AND VERIFIED WITH: MURPHY E. AT 1012A ON [id number] BY THOMPSON S. Performed at [facility], [address], [place], [place] [address]      Labs: BNP (last 3 results) Recent Labs    12/16/16 2153 01/01/17 1558  BNP 18.0 20.0   Basic Metabolic Panel: Recent Labs  Lab 07/26/17 0434 07/27/17 0531 07/28/17 0402 07/30/17 0653  NA 135 140 141 140  K 4.7 4.1 3.9 3.2*  CL  88* 100 102 91*  CO2 34* 28 32 36*  GLUCOSE 188* 144* 156* 102*  BUN  26* 34* 27* 22  CREATININE 1.82* 1.15* 0.87 0.79  CALCIUM 9.7 9.5 9.6 9.0   Liver Function Tests: Recent Labs  Lab 07/26/17 0434 07/27/17 0531  AST 31 22  ALT 26 21  ALKPHOS 74 57  BILITOT 1.2 0.8  PROT 8.7* 7.2  ALBUMIN 3.8 3.0*   No results for input(s): LIPASE, AMYLASE in the last 168 hours. No results for input(s): AMMONIA in the last 168 hours. CBC: Recent Labs  Lab 07/26/17 0434 07/27/17 0531 07/28/17 0402 07/30/17 0653  WBC 21.9* 11.5* 11.6* 7.0  NEUTROABS 17.8*  --   --   --   HGB 13.0 10.8* 9.9* 11.6*  HCT 41.2 35.2* 32.8* 37.9  MCV 87.5 88.0 88.4 89.6  PLT 338 207 255 212   Cardiac Enzymes: No results for input(s): CKTOTAL, CKMB, CKMBINDEX, TROPONINI in the last 168 hours. BNP: Invalid input(s): POCBNP CBG: Recent Labs  Lab 07/29/17 1208 07/29/17 1705 07/29/17 2211 07/30/17 0800 07/30/17 1116  GLUCAP 115* 126* 133* 89 144*   D-Dimer No results for input(s): DDIMER in the last 72 hours. Hgb A1c No results for input(s): HGBA1C in the last 72 hours. Lipid Profile No results for input(s): CHOL, HDL, LDLCALC, TRIG, CHOLHDL, LDLDIRECT in the last 72 hours. Thyroid function studies No results for input(s): TSH, T4TOTAL, T3FREE, THYROIDAB in the last 72 hours.  Invalid input(s): FREET3 Anemia work up No results for input(s): VITAMINB12, FOLATE, FERRITIN, TIBC, IRON, RETICCTPCT in the last 72 hours. Urinalysis    Component Value Date/Time   COLORURINE YELLOW 07/26/2017 0434   APPEARANCEUR HAZY (A) 07/26/2017 0434   LABSPEC 1.015 07/26/2017 0434   PHURINE 6.0 07/26/2017 0434   GLUCOSEU NEGATIVE 07/26/2017 0434   HGBUR TRACE (A) 07/26/2017 0434   BILIRUBINUR NEGATIVE 07/26/2017 0434   BILIRUBINUR neg 11/24/2016 1131   KETONESUR TRACE (A) 07/26/2017 0434   PROTEINUR 30 (A) 07/26/2017 0434   UROBILINOGEN 0.2 11/24/2016 1131   NITRITE NEGATIVE 07/26/2017 0434   LEUKOCYTESUR NEGATIVE 07/26/2017 0434   Sepsis Labs Invalid input(s): PROCALCITONIN,   WBC,  LACTICIDVEN Microbiology Recent Results (from the past 240 hour(s))  Blood Culture (routine x 2)     Status: Abnormal   Collection Time: 07/26/17  4:34 AM  Result Value Ref Range Status   Specimen Description   Final    RIGHT ANTECUBITAL Performed at [facility], [address] [place], [place] [address]    Special Requests   Final    BOTTLES DRAWN AEROBIC AND ANAEROBIC Blood Culture adequate volume Performed at [facility], [address], [place], [place] [address]    Culture  Setup Time   Final    GRAM POSITIVE COCCI IN BOTH AEROBIC AND ANAEROBIC BOTTLES Gram Stain Report Called to,Read Back By and Verified With: JD, RN @ 2107 ON 7.2.19 BY L.BOWMAN FOR ANA BOTTLE R.WAGONER @ 2045 ON 7.2.19 BY L.BOWMAN FOR AEB BOTTLE CRITICAL RESULT CALLED TO, READ BACK BY AND VERIFIED WITH: Ardis Rowan RN 07/27/17 0049 JDW    Culture (A)  Final    STAPHYLOCOCCUS SPECIES (COAGULASE NEGATIVE) THE SIGNIFICANCE OF ISOLATING THIS ORGANISM FROM A SINGLE SET OF BLOOD CULTURES WHEN MULTIPLE SETS ARE DRAWN IS UNCERTAIN. PLEASE NOTIFY THE MICROBIOLOGY DEPARTMENT WITHIN ONE WEEK IF SPECIATION AND SENSITIVITIES ARE REQUIRED. Performed at [facility], [address], [place], [place] [address]    Report Status 07/30/2017 FINAL  Final  Blood Culture ID Panel (Reflexed)     Status: Abnormal   Collection Time: 07/26/17  4:34 AM  Result Value Ref Range Status   Enterococcus species NOT DETECTED NOT DETECTED Final   Listeria monocytogenes NOT DETECTED NOT DETECTED Final   Staphylococcus species DETECTED (A) NOT DETECTED Final    Comment: Methicillin (oxacillin) susceptible coagulase negative staphylococcus. Possible blood culture contaminant (unless isolated from more than one blood culture draw or clinical case suggests pathogenicity). No antibiotic treatment is indicated for blood  culture contaminants. CRITICAL RESULT CALLED TO, READ BACK BY AND VERIFIED WITH: R Ardelle Anton RN 07/27/17 0049 JDW     Staphylococcus aureus NOT DETECTED NOT DETECTED Final   Methicillin resistance NOT DETECTED NOT DETECTED Final   Streptococcus species DETECTED (A) NOT DETECTED Final    Comment: CRITICAL RESULT CALLED TO, READ BACK BY AND VERIFIED WITH: Ardis Rowan RN 07/27/17 0049 JDW    Streptococcus agalactiae DETECTED (A) NOT DETECTED Final    Comment: CRITICAL RESULT CALLED TO, READ BACK BY AND VERIFIED WITH: R WAGONER RN 07/27/17 0049 JDW    Streptococcus pneumoniae NOT DETECTED NOT DETECTED Final   Streptococcus pyogenes NOT DETECTED NOT DETECTED Final   Acinetobacter baumannii NOT DETECTED NOT DETECTED Final   Enterobacteriaceae species NOT DETECTED NOT DETECTED Final   Enterobacter cloacae complex NOT DETECTED NOT DETECTED Final   Escherichia coli NOT DETECTED NOT DETECTED Final   Klebsiella oxytoca NOT DETECTED NOT DETECTED Final   Klebsiella pneumoniae NOT DETECTED NOT DETECTED Final   Proteus species NOT DETECTED NOT DETECTED Final   Serratia marcescens NOT DETECTED NOT DETECTED Final   Haemophilus influenzae NOT DETECTED NOT DETECTED Final   Neisseria meningitidis NOT DETECTED NOT DETECTED Final   Pseudomonas aeruginosa NOT DETECTED NOT DETECTED Final   Candida albicans NOT DETECTED NOT DETECTED Final   Candida glabrata NOT DETECTED NOT DETECTED Final   Candida krusei NOT DETECTED NOT DETECTED Final   Candida parapsilosis NOT DETECTED NOT DETECTED Final   Candida tropicalis NOT DETECTED NOT DETECTED Final  Blood Culture (routine x 2)     Status: None (Preliminary result)   Collection Time: 07/26/17  5:07 AM  Result Value Ref Range Status   Specimen Description BLOOD LEFT HAND  Final   Special Requests   Final    BOTTLES DRAWN AEROBIC AND ANAEROBIC Blood Culture adequate volume   Culture  Setup Time PENDING  Incomplete   Culture   Final    NO GROWTH 4 DAYS Performed at [facility], [address], [place], [place] [address]    Report Status PENDING  Incomplete  Culture, Urine      Status: None   Collection Time: 07/26/17  6:32 AM  Result Value Ref Range Status   Specimen Description   Final    URINE, RANDOM Performed at [facility], [address] [place], [place] [address]    Special Requests   Final    NONE Performed at [facility], [address] [place], [place] [address]    Culture   Final    NO GROWTH Performed at [facility], [address] [place], [place] [address]    Report Status 07/27/2017 FINAL  Final  MRSA PCR Screening     Status: Abnormal   Collection Time: 07/26/17  8:31 AM  Result Value Ref Range Status   MRSA by PCR POSITIVE (A) NEGATIVE Final    Comment:        The GeneXpert MRSA Assay (  FDA approved for NASAL specimens only), is one component of a comprehensive MRSA colonization surveillance program. It is not intended to diagnose MRSA infection nor to guide or monitor treatment for MRSA infections. RESULT CALLED TO, READ BACK BY AND VERIFIED WITH: MURPHY E. AT 1012A ON [id number] BY THOMPSON S. Performed at [facility], [address] [place], [place] [address]      Time coordinating discharge:  SIGNED:   Erick Blinks, MD  [facility] 07/30/2017, 2:55 PM Pager   If 7PM-7AM, please contact night-coverage [email or web address] Password [email or web address]

## 2017-07-30 NOTE — Progress Notes (Signed)
Subjective: She says she feels okay.  She still cannot tell me if she is back to baseline or not but she is in no distress  Objective: Vital signs in last 24 hours: Temp:  [97.6 F (36.4 C)-98.1 F (36.7 C)] 97.6 F (36.4 C) (07/06 8022) Pulse Rate:  [89-104] 89 (07/06 0637) Resp:  [20-22] 20 (07/06 0637) BP: (144-181)/(81-97) 144/86 (07/06 0645) SpO2:  [97 %-99 %] 99 % (07/06 0637) Weight change:  Last BM Date: (unknown)  Intake/Output from previous day: 07/05 0701 - 07/06 0700 In: 1080 [P.O.:1080] Out: 1050 [Urine:1050]  PHYSICAL EXAM General appearance: alert, cooperative and no distress Resp: clear to auscultation bilaterally Cardio: regular rate and rhythm, S1, S2 normal, no murmur, click, rub or gallop GI: soft, non-tender; bowel sounds normal; no masses,  no organomegaly Extremities: extremities normal, atraumatic, no cyanosis or edema  Lab Results:  Results for orders placed or performed during the hospital encounter of 07/26/17 (from the past 48 hour(s))  Glucose, capillary     Status: Abnormal   Collection Time: 07/28/17 11:39 AM  Result Value Ref Range   Glucose-Capillary 195 (H) 70 - 99 mg/dL   Comment 1 Notify RN    Comment 2 Document in Chart   Glucose, capillary     Status: Abnormal   Collection Time: 07/28/17  5:04 PM  Result Value Ref Range   Glucose-Capillary 134 (H) 70 - 99 mg/dL   Comment 1 Notify RN    Comment 2 Document in Chart   Glucose, capillary     Status: Abnormal   Collection Time: 07/28/17 10:01 PM  Result Value Ref Range   Glucose-Capillary 162 (H) 70 - 99 mg/dL   Comment 1 Notify RN    Comment 2 Document in Chart   Glucose, capillary     Status: Abnormal   Collection Time: 07/29/17  9:00 AM  Result Value Ref Range   Glucose-Capillary 152 (H) 70 - 99 mg/dL  Glucose, capillary     Status: Abnormal   Collection Time: 07/29/17 12:08 PM  Result Value Ref Range   Glucose-Capillary 115 (H) 70 - 99 mg/dL  Glucose, capillary     Status:  Abnormal   Collection Time: 07/29/17  5:05 PM  Result Value Ref Range   Glucose-Capillary 126 (H) 70 - 99 mg/dL  Glucose, capillary     Status: Abnormal   Collection Time: 07/29/17 10:11 PM  Result Value Ref Range   Glucose-Capillary 133 (H) 70 - 99 mg/dL   Comment 1 Notify RN   CBC     Status: Abnormal   Collection Time: 07/30/17  6:53 AM  Result Value Ref Range   WBC 7.0 4.0 - 10.5 K/uL   RBC 4.23 3.87 - 5.11 MIL/uL   Hemoglobin 11.6 (L) 12.0 - 15.0 g/dL   HCT 37.9 36.0 - 46.0 %   MCV 89.6 78.0 - 100.0 fL   MCH 27.4 26.0 - 34.0 pg   MCHC 30.6 30.0 - 36.0 g/dL   RDW 14.6 11.5 - 15.5 %   Platelets 212 150 - 400 K/uL    Comment: Performed at [facility], [address], [place], [place] [address]  Basic metabolic panel     Status: Abnormal   Collection Time: 07/30/17  6:53 AM  Result Value Ref Range   Sodium 140 135 - 145 mmol/L   Potassium 3.2 (L) 3.5 - 5.1 mmol/L   Chloride 91 (L) 98 - 111 mmol/L    Comment: Please note change in  reference range.   CO2 36 (H) 22 - 32 mmol/L   Glucose, Bld 102 (H) 70 - 99 mg/dL    Comment: Please note change in reference range.   BUN 22 8 - 23 mg/dL    Comment: Please note change in reference range.   Creatinine, Ser 0.79 0.44 - 1.00 mg/dL   Calcium 9.0 8.9 - 10.3 mg/dL   GFR calc non Af Amer >60 >60 mL/min   GFR calc Af Amer >60 >60 mL/min    Comment: (NOTE) The eGFR has been calculated using the CKD EPI equation. This calculation has not been validated in all clinical situations. eGFR's persistently <60 mL/min signify possible Chronic Kidney Disease.    Anion gap 13 5 - 15    Comment: Performed at [facility], [address], [place], [place] [address]  Glucose, capillary     Status: None   Collection Time: 07/30/17  8:00 AM  Result Value Ref Range   Glucose-Capillary 89 70 - 99 mg/dL    ABGS No results for input(s): PHART, PO2ART, TCO2, HCO3 in the last 72 hours.  Invalid input(s): PCO2 CULTURES Recent Results (from the  past 240 hour(s))  Blood Culture (routine x 2)     Status: Abnormal (Preliminary result)   Collection Time: 07/26/17  4:34 AM  Result Value Ref Range Status   Specimen Description   Final    RIGHT ANTECUBITAL Performed at [facility], [address], [place], [place] [address]    Special Requests   Final    BOTTLES DRAWN AEROBIC AND ANAEROBIC Blood Culture adequate volume Performed at [facility], [address], [place], [place] [address]    Culture  Setup Time   Final    GRAM POSITIVE COCCI IN BOTH AEROBIC AND ANAEROBIC BOTTLES Gram Stain Report Called to,Read Back By and Verified With: JD, RN @ 2107 ON 7.2.19 BY L.BOWMAN FOR ANA BOTTLE R.WAGONER @ 2045 ON 7.2.19 BY L.BOWMAN FOR AEB BOTTLE CRITICAL RESULT CALLED TO, READ BACK BY AND VERIFIED WITH: Lynwood Dawley RN 07/27/17 0049 JDW    Culture (A)  Final    STAPHYLOCOCCUS SPECIES (COAGULASE NEGATIVE) THE SIGNIFICANCE OF ISOLATING THIS ORGANISM FROM A SINGLE SET OF BLOOD CULTURES WHEN MULTIPLE SETS ARE DRAWN IS UNCERTAIN. PLEASE NOTIFY THE MICROBIOLOGY DEPARTMENT WITHIN ONE WEEK IF SPECIATION AND SENSITIVITIES ARE REQUIRED. CULTURE REINCUBATED FOR BETTER GROWTH Performed at [facility], [place] [address], [place], [place] [address]    Report Status PENDING  Incomplete  Blood Culture ID Panel (Reflexed)     Status: Abnormal   Collection Time: 07/26/17  4:34 AM  Result Value Ref Range Status   Enterococcus species NOT DETECTED NOT DETECTED Final   Listeria monocytogenes NOT DETECTED NOT DETECTED Final   Staphylococcus species DETECTED (A) NOT DETECTED Final    Comment: Methicillin (oxacillin) susceptible coagulase negative staphylococcus. Possible blood culture contaminant (unless isolated from more than one blood culture draw or clinical case suggests pathogenicity). No antibiotic treatment is indicated for blood  culture contaminants. CRITICAL RESULT CALLED TO, READ BACK BY AND VERIFIED WITH: Lynwood Dawley RN 07/27/17 0049 JDW     Staphylococcus aureus NOT DETECTED NOT DETECTED Final   Methicillin resistance NOT DETECTED NOT DETECTED Final   Streptococcus species DETECTED (A) NOT DETECTED Final    Comment: CRITICAL RESULT CALLED TO, READ BACK BY AND VERIFIED WITH: Lynwood Dawley RN 07/27/17 0049 JDW    Streptococcus agalactiae DETECTED (A) NOT DETECTED Final    Comment: CRITICAL RESULT  CALLED TO, READ BACK BY AND VERIFIED WITH: Lynwood Dawley RN 07/27/17 0049 JDW    Streptococcus pneumoniae NOT DETECTED NOT DETECTED Final   Streptococcus pyogenes NOT DETECTED NOT DETECTED Final   Acinetobacter baumannii NOT DETECTED NOT DETECTED Final   Enterobacteriaceae species NOT DETECTED NOT DETECTED Final   Enterobacter cloacae complex NOT DETECTED NOT DETECTED Final   Escherichia coli NOT DETECTED NOT DETECTED Final   Klebsiella oxytoca NOT DETECTED NOT DETECTED Final   Klebsiella pneumoniae NOT DETECTED NOT DETECTED Final   Proteus species NOT DETECTED NOT DETECTED Final   Serratia marcescens NOT DETECTED NOT DETECTED Final   Haemophilus influenzae NOT DETECTED NOT DETECTED Final   Neisseria meningitidis NOT DETECTED NOT DETECTED Final   Pseudomonas aeruginosa NOT DETECTED NOT DETECTED Final   Candida albicans NOT DETECTED NOT DETECTED Final   Candida glabrata NOT DETECTED NOT DETECTED Final   Candida krusei NOT DETECTED NOT DETECTED Final   Candida parapsilosis NOT DETECTED NOT DETECTED Final   Candida tropicalis NOT DETECTED NOT DETECTED Final  Blood Culture (routine x 2)     Status: None (Preliminary result)   Collection Time: 07/26/17  5:07 AM  Result Value Ref Range Status   Specimen Description BLOOD LEFT HAND  Final   Special Requests   Final    BOTTLES DRAWN AEROBIC AND ANAEROBIC Blood Culture adequate volume   Culture  Setup Time PENDING  Incomplete   Culture   Final    NO GROWTH 4 DAYS Performed at [facility] [address], [place], [place] [address]    Report Status PENDING  Incomplete  Culture, Urine      Status: None   Collection Time: 07/26/17  6:32 AM  Result Value Ref Range Status   Specimen Description   Final    URINE, RANDOM Performed at [facility], [address], [place], [place] [address]    Special Requests   Final    NONE Performed at [facility], [address], [place], [place] [address]    Culture   Final    NO GROWTH Performed at [facility], [place] [address], [place], [place] [address]    Report Status 07/27/2017 FINAL  Final  MRSA PCR Screening     Status: Abnormal   Collection Time: 07/26/17  8:31 AM  Result Value Ref Range Status   MRSA by PCR POSITIVE (A) NEGATIVE Final    Comment:        The GeneXpert MRSA Assay (FDA approved for NASAL specimens only), is one component of a comprehensive MRSA colonization surveillance program. It is not intended to diagnose MRSA infection nor to guide or monitor treatment for MRSA infections. RESULT CALLED TO, READ BACK BY AND VERIFIED WITH: MURPHY E. AT 1012A ON 109323 BY THOMPSON S. Performed at [facility], [address] [place], [place] [address]    Studies/Results: No results found.  Medications:  Prior to Admission:  Medications Prior to Admission  Medication Sig Dispense Refill Last Dose  . budesonide-formoterol (SYMBICORT) 160-4.5 MCG/ACT inhaler Inhale 2 puffs into the lungs 2 (two) times daily.   07/25/2017 at Unknown time  . busPIRone (BUSPAR) 15 MG tablet TAKE 1 TABLET BY MOUTH TWICE DAILY 180 tablet 1 07/25/2017 at Unknown time  . cholecalciferol (VITAMIN D) 1000 units tablet Take 1,000 Units by mouth daily.   07/25/2017 at Unknown time  . dextromethorphan-guaiFENesin (MUCINEX DM) 30-600 MG 12hr tablet Take 1 tablet by mouth 2 (two) times daily as needed for cough.  30 tablet 0 07/25/2017 at Unknown time  . furosemide (LASIX) 20 MG tablet Take 2 tablets (40 mg total) by mouth daily. 60 tablet 0 07/25/2017 at Unknown time  . ipratropium-albuterol (DUONEB) 0.5-2.5 (3) MG/3ML SOLN Take 3 mLs by nebulization  every 4 (four) hours. 360 mL 5 07/25/2017 at Unknown time  . Melatonin-Pyridoxine (MELATIN) 3-1 MG TABS Take 6 mg by mouth at bedtime.   Past Week at Unknown time  . metoprolol tartrate (LOPRESSOR) 25 MG tablet Take 0.5 tablets (12.5 mg total) by mouth 2 (two) times daily. 30 tablet 0 07/25/2017 at 800  . Multiple Vitamin (MULTIVITAMIN WITH MINERALS) TABS tablet Take 1 tablet by mouth daily.   07/25/2017 at Unknown time  . potassium chloride (K-DUR) 10 MEQ tablet Take 1 tablet (10 mEq total) by mouth daily. 90 tablet 3 07/25/2017 at Unknown time  . simvastatin (ZOCOR) 10 MG tablet Take 10 mg by mouth daily.   07/25/2017 at Unknown time  . tiotropium (SPIRIVA) 18 MCG inhalation capsule Place 18 mcg into inhaler and inhale daily.   07/25/2017 at Unknown time  . traMADol (ULTRAM) 50 MG tablet Take 1 tablet (50 mg total) by mouth every 6 (six) hours as needed for moderate pain or severe pain. 30 tablet 0 Past Week at Unknown time  . BREO ELLIPTA 100-25 MCG/INH AEPB Inhale 1 puff into the lungs daily. (Patient not taking: Reported on 07/26/2017) 90 each 1 Not Taking at Unknown time  . OXYGEN Inhale 3 L into the lungs continuous.    Past Week at Unknown time  . UNABLE TO [facility] bed mattress 1 each 0   . VENTOLIN HFA 108 (90 Base) MCG/ACT inhaler INHALE 2 PUFFS EVERY FOUR HOURS AS NEEDED FOR WHEEZING OR SHORTNESS OF BREATH (Patient not taking: Reported on 07/26/2017) 18 g 5 Not Taking at Unknown time   Scheduled: . aspirin EC  81 mg Oral Daily  . doxycycline  100 mg Oral Q12H  . enoxaparin (LOVENOX) injection  40 mg Subcutaneous Daily  . feeding supplement  1 Container Oral TID BM  . furosemide  40 mg Oral Daily  . insulin aspart  0-9 Units Subcutaneous TID WC  . mouth rinse  15 mL Mouth Rinse BID  . metoprolol tartrate  12.5 mg Oral BID  . mupirocin ointment  1 application Nasal BID  . predniSONE  40 mg Oral Q breakfast   Continuous:  JOI:NOMVEHMCN, ipratropium-albuterol  Assesment: She was admitted  with acute respiratory failure which is better.  She had systemic inflammatory response syndrome which has resolved.  I think she is back at baseline but I have not seen her before so I am not sure.  She is in no distress now. Active Problems:   Acute respiratory failure (HCC)   Sepsis ([place])   Goals of care, counseling/discussion   DNR (do not resuscitate) discussion   Palliative care by specialist    Plan: From a strictly pulmonary point of view she is probably okay for discharge back to her skilled care facility.    LOS: 4 days   Cain Fitzhenry L 07/30/2017, 10:41 AM

## 2017-07-30 NOTE — Progress Notes (Signed)
Patient will Discharge To: [facility] Anticipated DC Date:07/30/17 Family Notified:yes, left voice messages for Carlena Hurl [phone], Lonna Cobb [phone] Transport By: [facility]   Per MD patient ready for DC to [facility] . RN, patient, patient's family, and facility notified of DC. Assessment, Fl2/Pasrr, and Discharge Summary sent to facility. RN given number for report [phone] ask for [address].  Room # 116). DC packet on chart. Ambulance transport requested for patient.   CSW signing off.  Budd Palmer LCSWA [phone]

## 2017-07-31 DIAGNOSIS — L89152 Pressure ulcer of sacral region, stage 2: Secondary | ICD-10-CM | POA: Diagnosis not present

## 2017-07-31 DIAGNOSIS — J438 Other emphysema: Secondary | ICD-10-CM | POA: Diagnosis not present

## 2017-07-31 DIAGNOSIS — E440 Moderate protein-calorie malnutrition: Secondary | ICD-10-CM | POA: Diagnosis not present

## 2017-07-31 DIAGNOSIS — J449 Chronic obstructive pulmonary disease, unspecified: Secondary | ICD-10-CM | POA: Diagnosis not present

## 2017-07-31 DIAGNOSIS — J440 Chronic obstructive pulmonary disease with acute lower respiratory infection: Secondary | ICD-10-CM | POA: Diagnosis not present

## 2017-07-31 DIAGNOSIS — R32 Unspecified urinary incontinence: Secondary | ICD-10-CM | POA: Diagnosis not present

## 2017-07-31 DIAGNOSIS — J9620 Acute and chronic respiratory failure, unspecified whether with hypoxia or hypercapnia: Secondary | ICD-10-CM | POA: Diagnosis not present

## 2017-07-31 DIAGNOSIS — J441 Chronic obstructive pulmonary disease with (acute) exacerbation: Secondary | ICD-10-CM | POA: Diagnosis not present

## 2017-07-31 DIAGNOSIS — M818 Other osteoporosis without current pathological fracture: Secondary | ICD-10-CM | POA: Diagnosis not present

## 2017-08-01 ENCOUNTER — Other Ambulatory Visit: Payer: Self-pay | Admitting: Family Medicine

## 2017-08-02 DIAGNOSIS — D638 Anemia in other chronic diseases classified elsewhere: Secondary | ICD-10-CM | POA: Diagnosis not present

## 2017-08-02 DIAGNOSIS — I10 Essential (primary) hypertension: Secondary | ICD-10-CM | POA: Diagnosis not present

## 2017-08-02 DIAGNOSIS — R69 Illness, unspecified: Secondary | ICD-10-CM | POA: Diagnosis not present

## 2017-08-02 LAB — CULTURE, BLOOD (ROUTINE X 2)
Culture: NO GROWTH
Special Requests: ADEQUATE

## 2017-08-05 DIAGNOSIS — F5105 Insomnia due to other mental disorder: Secondary | ICD-10-CM | POA: Diagnosis not present

## 2017-08-05 DIAGNOSIS — F3342 Major depressive disorder, recurrent, in full remission: Secondary | ICD-10-CM | POA: Diagnosis not present

## 2017-08-05 DIAGNOSIS — R69 Illness, unspecified: Secondary | ICD-10-CM | POA: Diagnosis not present

## 2017-08-08 DIAGNOSIS — J9622 Acute and chronic respiratory failure with hypercapnia: Secondary | ICD-10-CM | POA: Diagnosis not present

## 2017-08-08 DIAGNOSIS — J449 Chronic obstructive pulmonary disease, unspecified: Secondary | ICD-10-CM | POA: Diagnosis not present

## 2017-08-10 DIAGNOSIS — J9610 Chronic respiratory failure, unspecified whether with hypoxia or hypercapnia: Secondary | ICD-10-CM | POA: Diagnosis not present

## 2017-08-10 DIAGNOSIS — J449 Chronic obstructive pulmonary disease, unspecified: Secondary | ICD-10-CM | POA: Diagnosis not present

## 2017-08-12 ENCOUNTER — Other Ambulatory Visit: Payer: Self-pay | Admitting: *Deleted

## 2017-08-12 NOTE — Patient Outreach (Signed)
[facility]) Care Management  08/12/2017  Sheila Pierce 05/10/1945 [id number]   Telephone Screen  Referral Date: 08/09/17  Referral Source: high ER utilizer  Referral Reason: to engage for [facility] CM services  Insurance:mediaid [place] family planning ?HTA  Outreach attempt #1 No answer at the listed home number [phone] number disconnected  Work number listed is to [employer] and no answer at the mobile number (busy signal x 2).  [facility] RN CM unable to leave a HIPAA compliant voicemail message along with CM's contact info.  2 sons listed but not as DPR  Plan: [facility] RN CM sent an unsuccessful outreach letter and scheduled this patient for another call attempt within 4 business days   [name] L. Noelle Penner, RN, BSN, CCM [facility] Telephonic Care Management Care Coordinator Direct number [phone]  Main [facility] number [phone] Fax number [phone]

## 2017-08-15 ENCOUNTER — Ambulatory Visit: Payer: Self-pay | Admitting: *Deleted

## 2017-08-17 ENCOUNTER — Other Ambulatory Visit: Payer: Self-pay | Admitting: *Deleted

## 2017-08-18 DIAGNOSIS — I10 Essential (primary) hypertension: Secondary | ICD-10-CM | POA: Diagnosis not present

## 2017-08-18 DIAGNOSIS — R41841 Cognitive communication deficit: Secondary | ICD-10-CM | POA: Diagnosis not present

## 2017-08-18 DIAGNOSIS — I5030 Unspecified diastolic (congestive) heart failure: Secondary | ICD-10-CM | POA: Diagnosis not present

## 2017-08-18 DIAGNOSIS — J441 Chronic obstructive pulmonary disease with (acute) exacerbation: Secondary | ICD-10-CM | POA: Diagnosis not present

## 2017-08-19 NOTE — Patient Outreach (Addendum)
  [facility]) Care Management  08/19/2017  Sheila Pierce 01/22/1946 [id number]   Telephone Screen  Referral Date: 08/09/17             Referral Source: high ER utilizer  Referral Reason: to engage for [facility] CM services  Insurance:mediaid [place] family planning ?HTA  Outreach attempt #2 No answer at the listed home number [phone] number disconnected  No answer at the mobile number (busy signal).  [facility] RN CM unable to leave a HIPAA compliant voicemail message along with CM's contact info.  2 sons listed but not as DPR  Plan: [facility] RN CM scheduled this patient for another call attempt within 4 business days   Kjersti Dittmer L. Noelle Penner, RN, BSN, CCM [facility] Telephonic Care Management Care Coordinator Direct number [phone]  Main [facility] number [phone] Fax number [phone]

## 2017-08-22 ENCOUNTER — Other Ambulatory Visit: Payer: Self-pay | Admitting: *Deleted

## 2017-08-22 NOTE — Patient Outreach (Signed)
[facility]) Care Management  08/22/2017  Sheila Pierce 01-15-1946 [id number]   Telephone Screen  Referral Date:08/09/17 Referral Source:high ER utilizer Referral Reason:to engage for [facility] CM services Insurance:mediaid [place] family planning ?HTA  Outreach attempt #3 No answerat the listed home number [phone] number disconnected  No answer at the mobile number (busy signal).[facility] RN CMunable to leave aHIPAA compliant voicemail message along with CM's contact info.  [facility] RN CM left a HIPAA compliant voice message at listed son, [name] number  Plan: [facility] RN CM scheduled this patient for case closure if no response from patient    Cala Bradford L. Noelle Penner, RN, BSN, CCM [facility] Telephonic Care Management Care Coordinator Direct number [phone]  Main [facility] number [phone] Fax number [phone]

## 2017-08-26 ENCOUNTER — Other Ambulatory Visit: Payer: Self-pay | Admitting: *Deleted

## 2017-08-26 NOTE — Patient Outreach (Addendum)
[facility]) Care Management  08/26/2017  Sheila Pierce 1945/07/19 [id number]   Case closure   Call attempts made on 08/12/17, 08/19/17 and 08/22/17 Unsuccessful outreach letter sent on 08/12/17 without a response   Plan [facility] RN CM will close case after no response from patient within 10 business days. Unable to reach  [name] Noelle Penner, RN, BSN, CCM [facility] Telephonic Care Management Care Coordinator Office number [phone] Mobile number [phone]  Main [facility] number [phone] Fax number [phone]

## 2017-08-28 DIAGNOSIS — M818 Other osteoporosis without current pathological fracture: Secondary | ICD-10-CM | POA: Diagnosis not present

## 2017-08-28 DIAGNOSIS — I959 Hypotension, unspecified: Secondary | ICD-10-CM | POA: Diagnosis not present

## 2017-08-28 DIAGNOSIS — R32 Unspecified urinary incontinence: Secondary | ICD-10-CM | POA: Diagnosis not present

## 2017-08-28 DIAGNOSIS — J9622 Acute and chronic respiratory failure with hypercapnia: Secondary | ICD-10-CM | POA: Diagnosis not present

## 2017-08-28 DIAGNOSIS — Z9981 Dependence on supplemental oxygen: Secondary | ICD-10-CM | POA: Diagnosis not present

## 2017-08-28 DIAGNOSIS — J9620 Acute and chronic respiratory failure, unspecified whether with hypoxia or hypercapnia: Secondary | ICD-10-CM | POA: Diagnosis not present

## 2017-08-28 DIAGNOSIS — R000 Tachycardia, unspecified: Secondary | ICD-10-CM | POA: Diagnosis not present

## 2017-08-28 DIAGNOSIS — R5381 Other malaise: Secondary | ICD-10-CM | POA: Diagnosis not present

## 2017-08-28 DIAGNOSIS — J438 Other emphysema: Secondary | ICD-10-CM | POA: Diagnosis not present

## 2017-08-28 DIAGNOSIS — B961 Klebsiella pneumoniae [K. pneumoniae] as the cause of diseases classified elsewhere: Secondary | ICD-10-CM | POA: Diagnosis not present

## 2017-08-28 DIAGNOSIS — Z87891 Personal history of nicotine dependence: Secondary | ICD-10-CM | POA: Diagnosis not present

## 2017-08-28 DIAGNOSIS — E872 Acidosis: Secondary | ICD-10-CM | POA: Diagnosis not present

## 2017-08-28 DIAGNOSIS — R062 Wheezing: Secondary | ICD-10-CM | POA: Diagnosis not present

## 2017-08-28 DIAGNOSIS — R0902 Hypoxemia: Secondary | ICD-10-CM | POA: Diagnosis not present

## 2017-08-28 DIAGNOSIS — I10 Essential (primary) hypertension: Secondary | ICD-10-CM | POA: Diagnosis not present

## 2017-08-28 DIAGNOSIS — L89152 Pressure ulcer of sacral region, stage 2: Secondary | ICD-10-CM | POA: Diagnosis not present

## 2017-08-28 DIAGNOSIS — R509 Fever, unspecified: Secondary | ICD-10-CM | POA: Diagnosis not present

## 2017-08-28 DIAGNOSIS — N390 Urinary tract infection, site not specified: Secondary | ICD-10-CM | POA: Diagnosis not present

## 2017-08-28 DIAGNOSIS — J9600 Acute respiratory failure, unspecified whether with hypoxia or hypercapnia: Secondary | ICD-10-CM | POA: Diagnosis not present

## 2017-08-28 DIAGNOSIS — R0689 Other abnormalities of breathing: Secondary | ICD-10-CM | POA: Diagnosis not present

## 2017-08-28 DIAGNOSIS — J441 Chronic obstructive pulmonary disease with (acute) exacerbation: Secondary | ICD-10-CM | POA: Diagnosis not present

## 2017-08-28 DIAGNOSIS — J440 Chronic obstructive pulmonary disease with acute lower respiratory infection: Secondary | ICD-10-CM | POA: Diagnosis not present

## 2017-08-28 DIAGNOSIS — G8929 Other chronic pain: Secondary | ICD-10-CM | POA: Diagnosis not present

## 2017-08-28 DIAGNOSIS — R7989 Other specified abnormal findings of blood chemistry: Secondary | ICD-10-CM | POA: Diagnosis not present

## 2017-08-28 DIAGNOSIS — R079 Chest pain, unspecified: Secondary | ICD-10-CM | POA: Diagnosis not present

## 2017-08-28 DIAGNOSIS — E440 Moderate protein-calorie malnutrition: Secondary | ICD-10-CM | POA: Diagnosis not present

## 2017-08-28 DIAGNOSIS — I252 Old myocardial infarction: Secondary | ICD-10-CM | POA: Diagnosis not present

## 2017-08-28 DIAGNOSIS — R69 Illness, unspecified: Secondary | ICD-10-CM | POA: Diagnosis not present

## 2017-08-28 DIAGNOSIS — R778 Other specified abnormalities of plasma proteins: Secondary | ICD-10-CM | POA: Diagnosis not present

## 2017-08-28 DIAGNOSIS — J449 Chronic obstructive pulmonary disease, unspecified: Secondary | ICD-10-CM | POA: Diagnosis not present

## 2017-08-29 DIAGNOSIS — N390 Urinary tract infection, site not specified: Secondary | ICD-10-CM | POA: Diagnosis not present

## 2017-08-29 DIAGNOSIS — I959 Hypotension, unspecified: Secondary | ICD-10-CM | POA: Diagnosis not present

## 2017-08-29 DIAGNOSIS — G8929 Other chronic pain: Secondary | ICD-10-CM | POA: Diagnosis not present

## 2017-08-29 DIAGNOSIS — J9620 Acute and chronic respiratory failure, unspecified whether with hypoxia or hypercapnia: Secondary | ICD-10-CM | POA: Diagnosis not present

## 2017-08-29 DIAGNOSIS — R69 Illness, unspecified: Secondary | ICD-10-CM | POA: Diagnosis not present

## 2017-08-30 DIAGNOSIS — R69 Illness, unspecified: Secondary | ICD-10-CM | POA: Diagnosis not present

## 2017-08-30 DIAGNOSIS — R5381 Other malaise: Secondary | ICD-10-CM | POA: Diagnosis not present

## 2017-08-30 DIAGNOSIS — J449 Chronic obstructive pulmonary disease, unspecified: Secondary | ICD-10-CM | POA: Diagnosis not present

## 2017-08-31 DIAGNOSIS — R32 Unspecified urinary incontinence: Secondary | ICD-10-CM | POA: Diagnosis not present

## 2017-08-31 DIAGNOSIS — J449 Chronic obstructive pulmonary disease, unspecified: Secondary | ICD-10-CM | POA: Diagnosis not present

## 2017-08-31 DIAGNOSIS — E440 Moderate protein-calorie malnutrition: Secondary | ICD-10-CM | POA: Diagnosis not present

## 2017-08-31 DIAGNOSIS — N390 Urinary tract infection, site not specified: Secondary | ICD-10-CM | POA: Diagnosis not present

## 2017-08-31 DIAGNOSIS — J440 Chronic obstructive pulmonary disease with acute lower respiratory infection: Secondary | ICD-10-CM | POA: Diagnosis not present

## 2017-08-31 DIAGNOSIS — M818 Other osteoporosis without current pathological fracture: Secondary | ICD-10-CM | POA: Diagnosis not present

## 2017-08-31 DIAGNOSIS — J441 Chronic obstructive pulmonary disease with (acute) exacerbation: Secondary | ICD-10-CM | POA: Diagnosis not present

## 2017-08-31 DIAGNOSIS — J438 Other emphysema: Secondary | ICD-10-CM | POA: Diagnosis not present

## 2017-08-31 DIAGNOSIS — L89152 Pressure ulcer of sacral region, stage 2: Secondary | ICD-10-CM | POA: Diagnosis not present

## 2017-09-01 DIAGNOSIS — Z87440 Personal history of urinary (tract) infections: Secondary | ICD-10-CM | POA: Diagnosis not present

## 2017-09-01 DIAGNOSIS — Z87891 Personal history of nicotine dependence: Secondary | ICD-10-CM | POA: Diagnosis not present

## 2017-09-01 DIAGNOSIS — R0689 Other abnormalities of breathing: Secondary | ICD-10-CM | POA: Diagnosis not present

## 2017-09-01 DIAGNOSIS — Z743 Need for continuous supervision: Secondary | ICD-10-CM | POA: Diagnosis not present

## 2017-09-01 DIAGNOSIS — I110 Hypertensive heart disease with heart failure: Secondary | ICD-10-CM | POA: Diagnosis not present

## 2017-09-01 DIAGNOSIS — R0682 Tachypnea, not elsewhere classified: Secondary | ICD-10-CM | POA: Diagnosis not present

## 2017-09-01 DIAGNOSIS — Z79899 Other long term (current) drug therapy: Secondary | ICD-10-CM | POA: Diagnosis not present

## 2017-09-01 DIAGNOSIS — Z7982 Long term (current) use of aspirin: Secondary | ICD-10-CM | POA: Diagnosis not present

## 2017-09-01 DIAGNOSIS — R279 Unspecified lack of coordination: Secondary | ICD-10-CM | POA: Diagnosis not present

## 2017-09-01 DIAGNOSIS — J439 Emphysema, unspecified: Secondary | ICD-10-CM | POA: Diagnosis not present

## 2017-09-01 DIAGNOSIS — J9622 Acute and chronic respiratory failure with hypercapnia: Secondary | ICD-10-CM | POA: Diagnosis not present

## 2017-09-01 DIAGNOSIS — R0902 Hypoxemia: Secondary | ICD-10-CM | POA: Diagnosis not present

## 2017-09-01 DIAGNOSIS — J9600 Acute respiratory failure, unspecified whether with hypoxia or hypercapnia: Secondary | ICD-10-CM | POA: Diagnosis not present

## 2017-09-01 DIAGNOSIS — Z7401 Bed confinement status: Secondary | ICD-10-CM | POA: Diagnosis not present

## 2017-09-01 DIAGNOSIS — R000 Tachycardia, unspecified: Secondary | ICD-10-CM | POA: Diagnosis not present

## 2017-09-01 DIAGNOSIS — Z9981 Dependence on supplemental oxygen: Secondary | ICD-10-CM | POA: Diagnosis not present

## 2017-09-01 DIAGNOSIS — R0602 Shortness of breath: Secondary | ICD-10-CM | POA: Diagnosis not present

## 2017-09-01 DIAGNOSIS — I10 Essential (primary) hypertension: Secondary | ICD-10-CM | POA: Diagnosis not present

## 2017-09-01 DIAGNOSIS — Z66 Do not resuscitate: Secondary | ICD-10-CM | POA: Diagnosis not present

## 2017-09-01 DIAGNOSIS — I509 Heart failure, unspecified: Secondary | ICD-10-CM | POA: Diagnosis not present

## 2017-09-01 DIAGNOSIS — N390 Urinary tract infection, site not specified: Secondary | ICD-10-CM | POA: Diagnosis not present

## 2017-09-01 DIAGNOSIS — R069 Unspecified abnormalities of breathing: Secondary | ICD-10-CM | POA: Diagnosis not present

## 2017-09-01 DIAGNOSIS — Z7951 Long term (current) use of inhaled steroids: Secondary | ICD-10-CM | POA: Diagnosis not present

## 2017-09-01 DIAGNOSIS — J95821 Acute postprocedural respiratory failure: Secondary | ICD-10-CM | POA: Diagnosis not present

## 2017-09-01 DIAGNOSIS — R7989 Other specified abnormal findings of blood chemistry: Secondary | ICD-10-CM | POA: Diagnosis not present

## 2017-09-01 DIAGNOSIS — J441 Chronic obstructive pulmonary disease with (acute) exacerbation: Secondary | ICD-10-CM | POA: Diagnosis not present

## 2017-09-01 DIAGNOSIS — J9612 Chronic respiratory failure with hypercapnia: Secondary | ICD-10-CM | POA: Diagnosis not present

## 2017-09-01 DIAGNOSIS — J449 Chronic obstructive pulmonary disease, unspecified: Secondary | ICD-10-CM | POA: Diagnosis not present

## 2017-09-01 DIAGNOSIS — Z9119 Patient's noncompliance with other medical treatment and regimen: Secondary | ICD-10-CM | POA: Diagnosis not present

## 2017-09-02 DIAGNOSIS — R0902 Hypoxemia: Secondary | ICD-10-CM | POA: Diagnosis not present

## 2017-09-02 DIAGNOSIS — I10 Essential (primary) hypertension: Secondary | ICD-10-CM | POA: Diagnosis not present

## 2017-09-02 DIAGNOSIS — J449 Chronic obstructive pulmonary disease, unspecified: Secondary | ICD-10-CM | POA: Diagnosis not present

## 2017-09-02 DIAGNOSIS — Z743 Need for continuous supervision: Secondary | ICD-10-CM | POA: Diagnosis not present

## 2017-09-02 DIAGNOSIS — J439 Emphysema, unspecified: Secondary | ICD-10-CM | POA: Diagnosis not present

## 2017-09-02 DIAGNOSIS — Z79899 Other long term (current) drug therapy: Secondary | ICD-10-CM | POA: Diagnosis not present

## 2017-09-02 DIAGNOSIS — R7989 Other specified abnormal findings of blood chemistry: Secondary | ICD-10-CM | POA: Diagnosis not present

## 2017-09-02 DIAGNOSIS — R069 Unspecified abnormalities of breathing: Secondary | ICD-10-CM | POA: Diagnosis not present

## 2017-09-02 DIAGNOSIS — R0682 Tachypnea, not elsewhere classified: Secondary | ICD-10-CM | POA: Diagnosis not present

## 2017-09-02 DIAGNOSIS — R0689 Other abnormalities of breathing: Secondary | ICD-10-CM | POA: Diagnosis not present

## 2017-09-02 DIAGNOSIS — R0602 Shortness of breath: Secondary | ICD-10-CM | POA: Diagnosis not present

## 2017-09-02 DIAGNOSIS — R000 Tachycardia, unspecified: Secondary | ICD-10-CM | POA: Diagnosis not present

## 2017-09-02 DIAGNOSIS — Z87891 Personal history of nicotine dependence: Secondary | ICD-10-CM | POA: Diagnosis not present

## 2017-09-02 DIAGNOSIS — J441 Chronic obstructive pulmonary disease with (acute) exacerbation: Secondary | ICD-10-CM | POA: Diagnosis not present

## 2017-09-02 DIAGNOSIS — Z7982 Long term (current) use of aspirin: Secondary | ICD-10-CM | POA: Diagnosis not present

## 2017-09-02 DIAGNOSIS — Z7951 Long term (current) use of inhaled steroids: Secondary | ICD-10-CM | POA: Diagnosis not present

## 2017-09-03 DIAGNOSIS — Z7951 Long term (current) use of inhaled steroids: Secondary | ICD-10-CM | POA: Diagnosis not present

## 2017-09-03 DIAGNOSIS — Z7982 Long term (current) use of aspirin: Secondary | ICD-10-CM | POA: Diagnosis not present

## 2017-09-03 DIAGNOSIS — I10 Essential (primary) hypertension: Secondary | ICD-10-CM | POA: Diagnosis not present

## 2017-09-03 DIAGNOSIS — R69 Illness, unspecified: Secondary | ICD-10-CM | POA: Diagnosis not present

## 2017-09-03 DIAGNOSIS — Z87891 Personal history of nicotine dependence: Secondary | ICD-10-CM | POA: Diagnosis not present

## 2017-09-03 DIAGNOSIS — J439 Emphysema, unspecified: Secondary | ICD-10-CM | POA: Diagnosis not present

## 2017-09-03 DIAGNOSIS — Z79899 Other long term (current) drug therapy: Secondary | ICD-10-CM | POA: Diagnosis not present

## 2017-09-03 DIAGNOSIS — R0602 Shortness of breath: Secondary | ICD-10-CM | POA: Diagnosis not present

## 2017-09-03 DIAGNOSIS — Z743 Need for continuous supervision: Secondary | ICD-10-CM | POA: Diagnosis not present

## 2017-10-01 DIAGNOSIS — J441 Chronic obstructive pulmonary disease with (acute) exacerbation: Secondary | ICD-10-CM | POA: Diagnosis not present

## 2017-10-01 DIAGNOSIS — E440 Moderate protein-calorie malnutrition: Secondary | ICD-10-CM | POA: Diagnosis not present

## 2017-10-01 DIAGNOSIS — J449 Chronic obstructive pulmonary disease, unspecified: Secondary | ICD-10-CM | POA: Diagnosis not present

## 2017-10-01 DIAGNOSIS — R32 Unspecified urinary incontinence: Secondary | ICD-10-CM | POA: Diagnosis not present

## 2017-10-01 DIAGNOSIS — J438 Other emphysema: Secondary | ICD-10-CM | POA: Diagnosis not present

## 2017-10-01 DIAGNOSIS — M818 Other osteoporosis without current pathological fracture: Secondary | ICD-10-CM | POA: Diagnosis not present

## 2017-10-01 DIAGNOSIS — J440 Chronic obstructive pulmonary disease with acute lower respiratory infection: Secondary | ICD-10-CM | POA: Diagnosis not present

## 2017-10-01 DIAGNOSIS — L89152 Pressure ulcer of sacral region, stage 2: Secondary | ICD-10-CM | POA: Diagnosis not present

## 2017-10-23 IMAGING — CR DG CHEST 1V PORT
1 series · 1 of 1 positions shown · non-contrast
Comparison: 11/03/2015 chest radiograph.

CLINICAL DATA: COPD exacerbation

EXAM:
PORTABLE CHEST 1 VIEW

[portable]
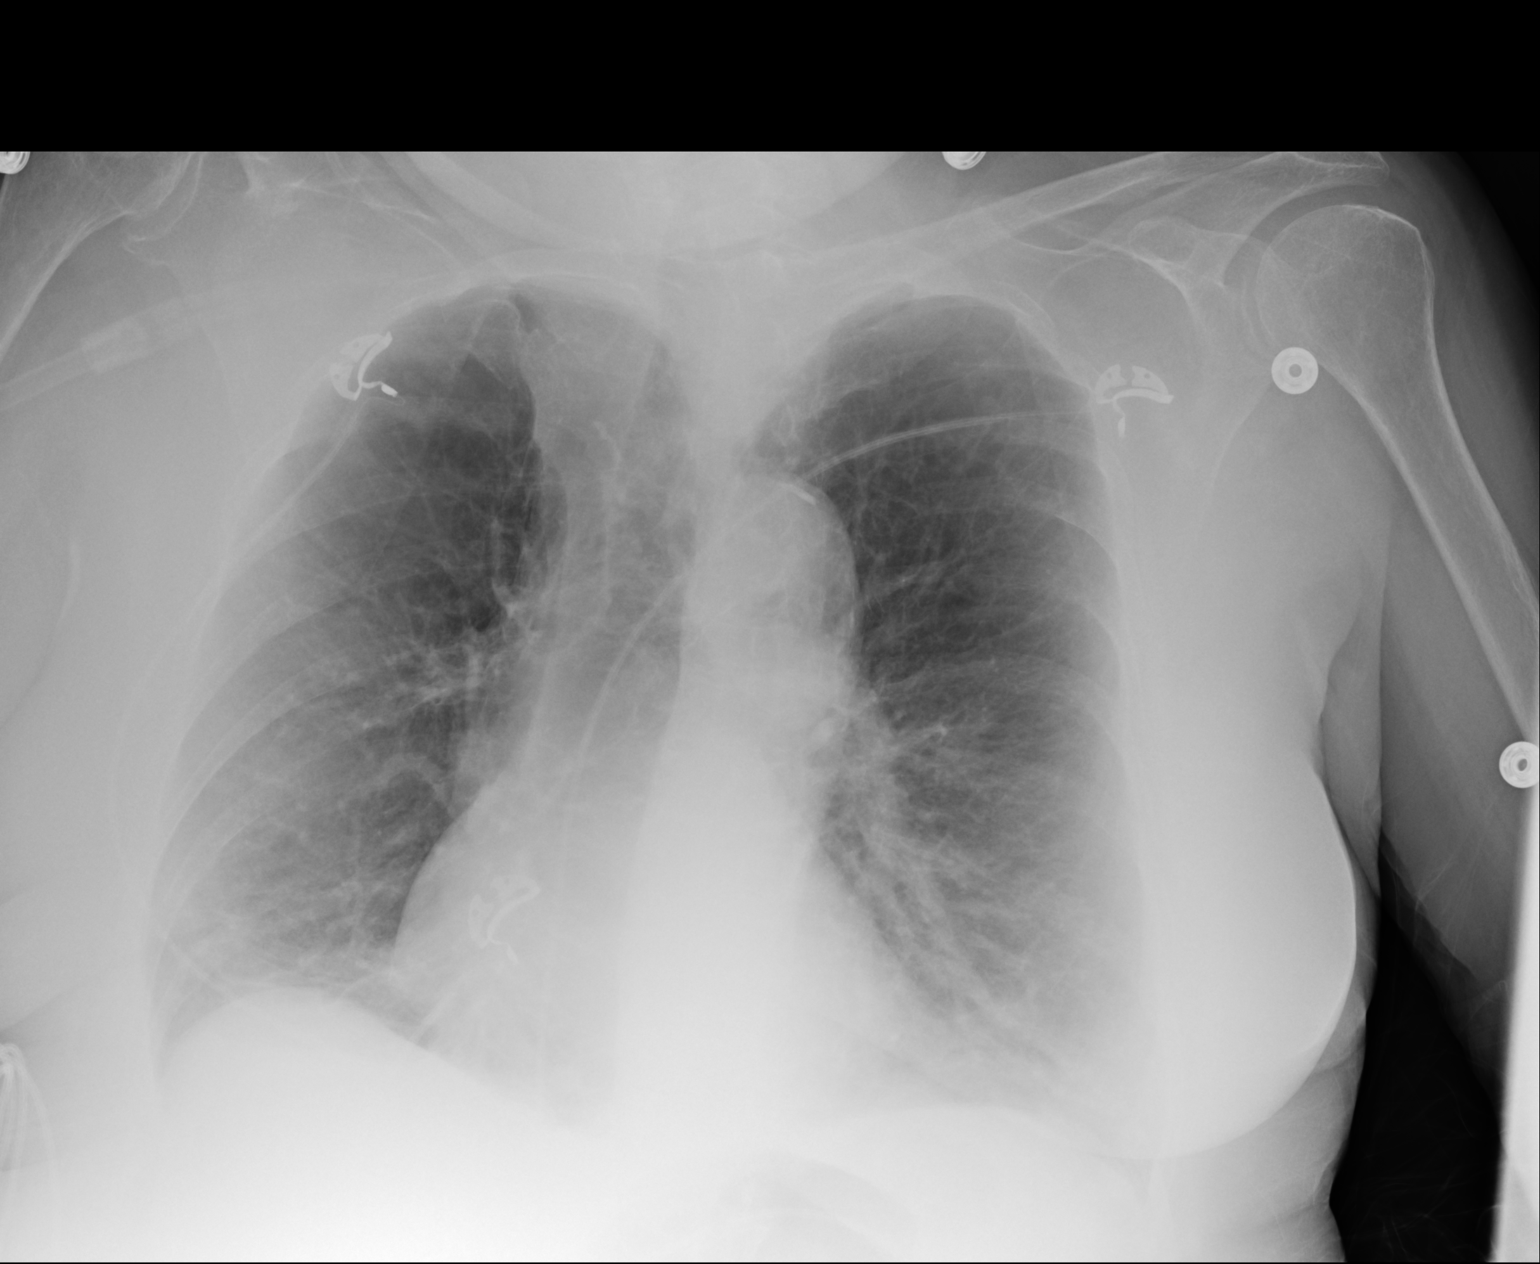

[1 of 1 positions shown; findings below may reference images not displayed]

FINDINGS: Slightly right rotated chest radiograph Stable cardiomediastinal
silhouette with normal heart size and aortic atherosclerosis. No
pneumothorax. No pleural effusion. Emphysema. No pulmonary edema. No
acute consolidative airspace disease. Curvilinear opacities at the
right lung base appears stable, consistent with mild scarring.
IMPRESSION: 1. No acute cardiopulmonary disease.
2. Emphysema.  Stable mild scarring at the right lung base.
3. Aortic atherosclerosis.

## 2017-10-24 DIAGNOSIS — Z87891 Personal history of nicotine dependence: Secondary | ICD-10-CM | POA: Diagnosis not present

## 2017-10-24 DIAGNOSIS — J441 Chronic obstructive pulmonary disease with (acute) exacerbation: Secondary | ICD-10-CM | POA: Diagnosis not present

## 2017-10-24 DIAGNOSIS — N390 Urinary tract infection, site not specified: Secondary | ICD-10-CM | POA: Diagnosis not present

## 2017-10-24 DIAGNOSIS — R319 Hematuria, unspecified: Secondary | ICD-10-CM | POA: Diagnosis not present

## 2017-10-24 DIAGNOSIS — Z743 Need for continuous supervision: Secondary | ICD-10-CM | POA: Diagnosis not present

## 2017-10-24 DIAGNOSIS — R29898 Other symptoms and signs involving the musculoskeletal system: Secondary | ICD-10-CM | POA: Diagnosis not present

## 2017-10-24 DIAGNOSIS — J449 Chronic obstructive pulmonary disease, unspecified: Secondary | ICD-10-CM | POA: Diagnosis not present

## 2017-10-24 DIAGNOSIS — R5381 Other malaise: Secondary | ICD-10-CM | POA: Diagnosis not present

## 2017-10-24 DIAGNOSIS — L89151 Pressure ulcer of sacral region, stage 1: Secondary | ICD-10-CM | POA: Diagnosis not present

## 2017-11-04 DIAGNOSIS — Z23 Encounter for immunization: Secondary | ICD-10-CM | POA: Diagnosis not present

## 2017-11-04 DIAGNOSIS — T83098A Other mechanical complication of other indwelling urethral catheter, initial encounter: Secondary | ICD-10-CM | POA: Diagnosis not present

## 2017-11-04 DIAGNOSIS — R000 Tachycardia, unspecified: Secondary | ICD-10-CM | POA: Diagnosis not present

## 2017-11-04 DIAGNOSIS — J9811 Atelectasis: Secondary | ICD-10-CM | POA: Diagnosis not present

## 2017-11-04 DIAGNOSIS — N390 Urinary tract infection, site not specified: Secondary | ICD-10-CM | POA: Diagnosis not present

## 2017-11-04 DIAGNOSIS — Z87891 Personal history of nicotine dependence: Secondary | ICD-10-CM | POA: Diagnosis not present

## 2017-11-04 DIAGNOSIS — Z7401 Bed confinement status: Secondary | ICD-10-CM | POA: Diagnosis not present

## 2017-11-04 DIAGNOSIS — J441 Chronic obstructive pulmonary disease with (acute) exacerbation: Secondary | ICD-10-CM | POA: Diagnosis not present

## 2017-11-04 DIAGNOSIS — Z79899 Other long term (current) drug therapy: Secondary | ICD-10-CM | POA: Diagnosis not present

## 2017-11-04 DIAGNOSIS — R0902 Hypoxemia: Secondary | ICD-10-CM | POA: Diagnosis not present

## 2017-11-04 DIAGNOSIS — J9622 Acute and chronic respiratory failure with hypercapnia: Secondary | ICD-10-CM | POA: Diagnosis not present

## 2017-11-04 DIAGNOSIS — I10 Essential (primary) hypertension: Secondary | ICD-10-CM | POA: Diagnosis not present

## 2017-11-04 DIAGNOSIS — Z7689 Persons encountering health services in other specified circumstances: Secondary | ICD-10-CM | POA: Diagnosis not present

## 2017-11-04 DIAGNOSIS — J9621 Acute and chronic respiratory failure with hypoxia: Secondary | ICD-10-CM | POA: Diagnosis not present

## 2017-11-04 DIAGNOSIS — R0602 Shortness of breath: Secondary | ICD-10-CM | POA: Diagnosis not present

## 2017-11-04 DIAGNOSIS — I959 Hypotension, unspecified: Secondary | ICD-10-CM | POA: Diagnosis not present

## 2017-11-04 DIAGNOSIS — Z9981 Dependence on supplemental oxygen: Secondary | ICD-10-CM | POA: Diagnosis not present

## 2017-11-04 DIAGNOSIS — J80 Acute respiratory distress syndrome: Secondary | ICD-10-CM | POA: Diagnosis not present

## 2017-11-04 DIAGNOSIS — E876 Hypokalemia: Secondary | ICD-10-CM | POA: Diagnosis not present

## 2017-11-04 DIAGNOSIS — R5381 Other malaise: Secondary | ICD-10-CM | POA: Diagnosis not present

## 2017-11-04 DIAGNOSIS — R062 Wheezing: Secondary | ICD-10-CM | POA: Diagnosis not present

## 2017-11-04 DIAGNOSIS — Z66 Do not resuscitate: Secondary | ICD-10-CM | POA: Diagnosis not present

## 2017-11-07 DIAGNOSIS — Z7689 Persons encountering health services in other specified circumstances: Secondary | ICD-10-CM | POA: Diagnosis not present

## 2017-11-07 DIAGNOSIS — T83098A Other mechanical complication of other indwelling urethral catheter, initial encounter: Secondary | ICD-10-CM | POA: Diagnosis not present

## 2017-11-08 DIAGNOSIS — I10 Essential (primary) hypertension: Secondary | ICD-10-CM | POA: Diagnosis not present

## 2017-11-08 DIAGNOSIS — Z743 Need for continuous supervision: Secondary | ICD-10-CM | POA: Diagnosis not present

## 2017-11-11 ENCOUNTER — Observation Stay (HOSPITAL_COMMUNITY)
Admission: EM | Admit: 2017-11-11 | Discharge: 2017-11-15 | Disposition: A | Discharge status: Home / Self Care | Payer: Medicare Other | Attending: Internal Medicine | Admitting: Internal Medicine

## 2017-11-11 ENCOUNTER — Other Ambulatory Visit: Payer: Self-pay

## 2017-11-11 ENCOUNTER — Emergency Department (HOSPITAL_COMMUNITY): Payer: Medicare Other

## 2017-11-11 ENCOUNTER — Encounter (HOSPITAL_COMMUNITY): Payer: Self-pay | Admitting: Emergency Medicine

## 2017-11-11 DIAGNOSIS — Z23 Encounter for immunization: Secondary | ICD-10-CM | POA: Insufficient documentation

## 2017-11-11 DIAGNOSIS — I129 Hypertensive chronic kidney disease with stage 1 through stage 4 chronic kidney disease, or unspecified chronic kidney disease: Secondary | ICD-10-CM | POA: Diagnosis not present

## 2017-11-11 DIAGNOSIS — L8990 Pressure ulcer of unspecified site, unspecified stage: Secondary | ICD-10-CM | POA: Diagnosis not present

## 2017-11-11 DIAGNOSIS — J9611 Chronic respiratory failure with hypoxia: Secondary | ICD-10-CM | POA: Insufficient documentation

## 2017-11-11 DIAGNOSIS — I10 Essential (primary) hypertension: Secondary | ICD-10-CM | POA: Diagnosis not present

## 2017-11-11 DIAGNOSIS — R069 Unspecified abnormalities of breathing: Secondary | ICD-10-CM | POA: Diagnosis not present

## 2017-11-11 DIAGNOSIS — Z7982 Long term (current) use of aspirin: Secondary | ICD-10-CM | POA: Insufficient documentation

## 2017-11-11 DIAGNOSIS — Z79899 Other long term (current) drug therapy: Secondary | ICD-10-CM | POA: Insufficient documentation

## 2017-11-11 DIAGNOSIS — I5032 Chronic diastolic (congestive) heart failure: Secondary | ICD-10-CM | POA: Diagnosis not present

## 2017-11-11 DIAGNOSIS — R0689 Other abnormalities of breathing: Secondary | ICD-10-CM | POA: Diagnosis not present

## 2017-11-11 DIAGNOSIS — Z87891 Personal history of nicotine dependence: Secondary | ICD-10-CM | POA: Insufficient documentation

## 2017-11-11 DIAGNOSIS — I5033 Acute on chronic diastolic (congestive) heart failure: Secondary | ICD-10-CM

## 2017-11-11 DIAGNOSIS — N183 Chronic kidney disease, stage 3 (moderate): Secondary | ICD-10-CM | POA: Diagnosis not present

## 2017-11-11 DIAGNOSIS — E7800 Pure hypercholesterolemia, unspecified: Secondary | ICD-10-CM | POA: Diagnosis present

## 2017-11-11 DIAGNOSIS — R000 Tachycardia, unspecified: Secondary | ICD-10-CM | POA: Diagnosis not present

## 2017-11-11 DIAGNOSIS — J441 Chronic obstructive pulmonary disease with (acute) exacerbation: Principal | ICD-10-CM | POA: Insufficient documentation

## 2017-11-11 DIAGNOSIS — R0602 Shortness of breath: Secondary | ICD-10-CM | POA: Diagnosis present

## 2017-11-11 LAB — CBC WITH DIFFERENTIAL/PLATELET
ABS IMMATURE GRANULOCYTES: 0.04 10*3/uL (ref 0.00–0.07)
Basophils Absolute: 0 10*3/uL (ref 0.0–0.1)
Basophils Relative: 0 %
EOS PCT: 3 %
Eosinophils Absolute: 0.2 10*3/uL (ref 0.0–0.5)
HEMATOCRIT: 38.9 % (ref 36.0–46.0)
HEMOGLOBIN: 11.2 g/dL — AB (ref 12.0–15.0)
Immature Granulocytes: 1 %
LYMPHS ABS: 0.3 10*3/uL — AB (ref 0.7–4.0)
LYMPHS PCT: 5 %
MCH: 25.9 pg — AB (ref 26.0–34.0)
MCHC: 28.8 g/dL — ABNORMAL LOW (ref 30.0–36.0)
MCV: 89.8 fL (ref 80.0–100.0)
MONO ABS: 0.6 10*3/uL (ref 0.1–1.0)
MONOS PCT: 7 %
Neutro Abs: 6.3 10*3/uL (ref 1.7–7.7)
Neutrophils Relative %: 84 %
Platelets: 196 10*3/uL (ref 150–400)
RBC: 4.33 MIL/uL (ref 3.87–5.11)
RDW: 15.3 % (ref 11.5–15.5)
WBC: 7.5 10*3/uL (ref 4.0–10.5)
nRBC: 0 % (ref 0.0–0.2)

## 2017-11-11 LAB — BASIC METABOLIC PANEL
Anion gap: 9 (ref 5–15)
BUN: 11 mg/dL (ref 8–23)
CHLORIDE: 87 mmol/L — AB (ref 98–111)
CO2: 40 mmol/L — ABNORMAL HIGH (ref 22–32)
CREATININE: 0.75 mg/dL (ref 0.44–1.00)
Calcium: 8.8 mg/dL — ABNORMAL LOW (ref 8.9–10.3)
GFR calc non Af Amer: >60 mL/min (ref >60)
Glucose, Bld: 133 mg/dL — ABNORMAL HIGH (ref 70–99)
POTASSIUM: 3.5 mmol/L (ref 3.5–5.1)
SODIUM: 136 mmol/L (ref 135–145)

## 2017-11-11 LAB — BRAIN NATRIURETIC PEPTIDE: B Natriuretic Peptide: 25 pg/mL (ref 0.0–100.0)

## 2017-11-11 LAB — TROPONIN I

## 2017-11-11 MED ORDER — METHYLPREDNISOLONE SODIUM SUCC 125 MG IJ SOLR
125.0000 mg | Freq: Once | INTRAMUSCULAR | Status: AC
  Administered 2017-11-11: 125 mg via INTRAVENOUS
  Filled 2017-11-11: qty 2

## 2017-11-11 MED ORDER — FUROSEMIDE 10 MG/ML IJ SOLN
40.0000 mg | Freq: Once | INTRAMUSCULAR | Status: AC
  Administered 2017-11-11: 40 mg via INTRAVENOUS
  Filled 2017-11-11: qty 4

## 2017-11-11 MED ORDER — MAGNESIUM SULFATE 2 GM/50ML IV SOLN
2.0000 g | Freq: Once | INTRAVENOUS | Status: AC
  Administered 2017-11-11: 2 g via INTRAVENOUS
  Filled 2017-11-11: qty 50

## 2017-11-11 MED ORDER — ALBUTEROL SULFATE (2.5 MG/3ML) 0.083% IN NEBU
5.0000 mg | INHALATION_SOLUTION | Freq: Once | RESPIRATORY_TRACT | Status: AC
  Administered 2017-11-11: 5 mg via RESPIRATORY_TRACT
  Filled 2017-11-11: qty 6

## 2017-11-11 NOTE — ED Notes (Signed)
Admitting doctor please contact [name] Su Hilt pt's son and is POA concerning needing medication reordered and oxygen for home as well, possible Care Management as well.  [phone]

## 2017-11-11 NOTE — H&P (Signed)
TRH H&P    Patient Demographics:    Sheila Pierce, is a 72 y.o. female  MRN: [id number]  DOB - 14-Jul-1945  Admit Date - 11/11/2017  Referring MD/NP/PA: Dr. Melina Copa  Outpatient Primary MD for the patient is Patient, No Pcp Per  Patient coming from: Home  Chief complaint-shortness of breath   HPI:    Sheila Pierce  is a 72 y.o. female, with history of COPD, diastolic CHF who is on chronic 3 L of oxygen via nasal cannula at baseline came to hospital with worsening shortness of breath.  Per EMS patient had not taken her Lasix for past 3 days.  She denies chest pain, no nausea vomiting or diarrhea.  Denies abdominal pain.  Complains of dysuria.  Patient is a poor historian so spoke to patient's son Nance Pear on phone.  As per patient's son she was put on hospice by [facility] and made DNR which both patient and her son did not like.  So she is not on hospice anymore.  Her medications were discontinued, her oxygen also was discontinued.  Patient has become more short of breath over past few days.  She was brought to hospital for further evaluation. In the ED patient was found to be in COPD exacerbation, BNP only 25.  But she does take Lasix at home for diastolic heart failure.   Review of systems:      All other systems reviewed and are negative.   With Past History of the following :    Past Medical History:  Diagnosis Date  . Acute respiratory failure ([place]) 12/16/2016  . Allergy   . Anemia   . Anxiety   . Arthritis   . Asthma   . Cataract   . Chronic diastolic CHF (congestive heart failure) ([place]) 10/30/2016  . Chronic kidney disease   . CKD (chronic kidney disease), stage III ([place]) 12/17/2016  . COPD (chronic obstructive pulmonary disease) ([facility])    Chronic resp failure on home O2  . Depression   . Diabetes mellitus without complication ([place])   . Emphysema of lung ([place])   . GERD  (gastroesophageal reflux disease)   . Hypercholesteremia 12/16/2015  . Hyperglycemia    Noted 09/2011 admission in setting of steroid use with normal HgbA1C.  Marland Kitchen Hypertension   . Hypokalemia   . Hyponatremia    Noted 09/2011 admission.  . NSTEMI (non-ST elevated myocardial infarction) ([place])    a. 09/2011 in setting of acute on chronic resp failure/COPD exacerbation --> cath demonstrated nonobstructive CAD 10/05/11 with EF 50%;  08/2012 elevated Ti in setting of COPD flare -->Echo: EF 60-65%, Gr 1 DD -->Med Rx.  . Osteoporosis   . Oxygen deficiency   . Pressure injury of skin 09/15/2016  . QT prolongation    Noted on EKG 09/2011 (590 in setting of K of 3, improved to 475 by discharge)  . Tobacco abuse 09/19/2012  . Trigeminal neuralgia   . Type 2 diabetes mellitus with stage 3 chronic kidney disease ([place]) 12/17/2016      Past  Surgical History:  Procedure Laterality Date  . ABDOMINAL HYSTERECTOMY     bleeding  . CHOLECYSTECTOMY    . LEFT HEART CATHETERIZATION WITH CORONARY ANGIOGRAM N/A 10/05/2011   Procedure: LEFT HEART CATHETERIZATION WITH CORONARY ANGIOGRAM;  Surgeon: Sherren Mocha, MD;  Location: [facility] CATH LAB;  Service: Cardiovascular;  Laterality: N/A;  . PLANTAR'S WART EXCISION        Social History:      Social History   Tobacco Use  . Smoking status: Former Smoker    Packs/day: 1.00    Years: 40.00    Pack years: 40.00    Types: Cigarettes    Start date: 01/26/1964    Last attempt to quit: 10/2014    Years since quitting: 3.0  . Smokeless tobacco: Never Used  Substance Use Topics  . Alcohol use: No    Comment: occasional       Family History :     Family History  Problem Relation Age of Onset  . Cancer Father        Lung  . Cancer Mother        Liver  . Hypertension Son   . Hyperlipidemia Son   . Post-traumatic stress disorder Son   . Depression Son   . Kidney disease Brother        liver and kidney failure  . Hypertension Son   . Hyperlipidemia Son   .  Cancer Other        colon  . Diabetes Son   . Lung disease Daughter        passed away at 71 months old      Home Medications:   Prior to Admission medications   Medication Sig Start Date End Date Taking? Authorizing Provider  aspirin EC 81 MG tablet Take 1 tablet (81 mg total) by mouth daily. 07/30/17   Kathie Dike, MD  BREO ELLIPTA 100-25 MCG/INH AEPB Inhale 1 puff into the lungs daily. Patient not taking: Reported on 07/26/2017 11/11/15   Raylene Everts, MD  budesonide-formoterol [facility]) 160-4.5 MCG/ACT inhaler Inhale 2 puffs into the lungs 2 (two) times daily.    [provider]  busPIRone (BUSPAR) 15 MG tablet TAKE 1 TABLET BY MOUTH TWICE DAILY 05/31/16   Raylene Everts, MD  cholecalciferol (VITAMIN D) 1000 units tablet Take 1,000 Units by mouth daily.    [provider]  dextromethorphan-guaiFENesin (MUCINEX DM) 30-600 MG 12hr tablet Take 1 tablet by mouth 2 (two) times daily as needed for cough. 02/05/17   Allie Bossier, MD  furosemide (LASIX) 20 MG tablet Take 2 tablets (40 mg total) by mouth daily. 02/05/17   Allie Bossier, MD  ipratropium-albuterol (DUONEB) 0.5-2.5 (3) MG/3ML SOLN Take 3 mLs by nebulization every 4 (four) hours. 11/11/15   Raylene Everts, MD  Melatonin-Pyridoxine (MELATIN) 3-1 MG TABS Take 6 mg by mouth at bedtime.    [provider]  metoprolol tartrate (LOPRESSOR) 25 MG tablet Take 0.5 tablets (12.5 mg total) by mouth 2 (two) times daily. 02/05/17   Allie Bossier, MD  Multiple Vitamin (MULTIVITAMIN WITH MINERALS) TABS tablet Take 1 tablet by mouth daily.    [provider]  OXYGEN Inhale 3 L into the lungs continuous.     [provider]  potassium chloride (K-DUR) 10 MEQ tablet Take 1 tablet (10 mEq total) by mouth daily. 03/25/16   Raylene Everts, MD  predniSONE (DELTASONE) 10 MG tablet Take 73m po daily for 2 days then  58m daily for 2 days then 235mdaily for 2 days then 1055maily for 2 days  then stop 07/30/17   MemKathie DikeD  simvastatin (ZOCOR) 10 MG tablet Take 10 mg by mouth daily.    [provider]  traMADol (ULTRAM) 50 MG tablet Take 1 tablet (50 mg total) by mouth every 6 (six) hours as needed for moderate pain or severe pain. 11/25/16   NelRaylene EvertsD  UNABLE TO FINU[facility]d mattress 03/15/17   NelRaylene EvertsD  VENTOLIN HFA 108 ([phone]) MCG/ACT inhaler INHALE 2 PUFFS EVERY FOUR HOURS AS NEEDED FOR WHEEZING OR SHORTNESS OF BREATH Patient not taking: Reported on 07/26/2017 10/07/16   NelRaylene EvertsD     Allergies:     Allergies  Allergen Reactions  . Penicillins Swelling and Other (See Comments)    Reaction:  Unspecified swelling reaction Has patient had a PCN reaction causing immediate rash, facial/tongue/throat swelling, SOB or lightheadedness with hypotension: Yes Has patient had a PCN reaction causing severe rash involving mucus membranes or skin necrosis: No Has patient had a PCN reaction that required hospitalization No Has patient had a PCN reaction occurring within the last 10 years: Yes If all of the above answers are "NO", then may proceed with Cephalosporin use.  . Sulfa Antibiotics Hives and Other (See Comments)    Reaction:  Hallucinations   . Codeine Nausea Only  . Hydrocodone Nausea Only  . Levaquin [Levofloxacin] Itching, Swelling and Rash     Physical Exam:   Vitals  Blood pressure 120/77, pulse (!) 112, temperature 98.9 F (37.2 C), temperature source Rectal, resp. rate 17, height 5' (1.524 m), weight 59 kg, SpO2 95 %.  1.  General: Appears in no acute distress  2. Psychiatric:  Intact judgement and  insight, awake alert, oriented x 3.  3. Neurologic: No focal neurological deficits, all cranial nerves intact.Strength 5/5 all 4 extremities, sensation intact all 4 extremities, plantars down going.  4. Eyes :  anicteric sclerae, moist conjunctivae with no lid lag. PERRLA.  5. ENMT:  Oropharynx clear  with moist mucous membranes and good dentition  6. Neck:  supple, no cervical lymphadenopathy appriciated, No thyromegaly  7. Respiratory : Normal respiratory effort, decreased breath sounds bilaterally  8. Cardiovascular : RRR, no gallops, rubs or murmurs, no leg edema  9. Gastrointestinal:  Positive bowel sounds, abdomen soft, non-tender to palpation,no hepatosplenomegaly, no rigidity or guarding       10. Skin:  No cyanosis, normal texture and turgor, no rash, lesions or ulcers  11.Musculoskeletal:  Good muscle tone,  joints appear normal , no effusions,  normal range of motion    Data Review:    CBC Recent Labs  Lab 11/11/17 2051  WBC 7.5  HGB 11.2*  HCT 38.9  PLT 196  MCV 89.8  MCH 25.9*  MCHC 28.8*  RDW 15.3  LYMPHSABS 0.3*  MONOABS 0.6  EOSABS 0.2  BASOSABS 0.0   ------------------------------------------------------------------------------------------------------------------  Results for orders placed or performed during the hospital encounter of 11/11/17 (from the past 48 hour(s))  Basic metabolic panel     Status: Abnormal   Collection Time: 11/11/17  8:51 PM  Result Value Ref Range   Sodium 136 135 - 145 mmol/L   Potassium 3.5 3.5 - 5.1 mmol/L   Chloride 87 (L) 98 - 111 mmol/L   CO2 40 (H) 22 - 32 mmol/L   Glucose, Bld 133 (H) 70 - 99 mg/dL  BUN 11 8 - 23 mg/dL   Creatinine, Ser 0.75 0.44 - 1.00 mg/dL   Calcium 8.8 (L) 8.9 - 10.3 mg/dL   GFR calc non Af Amer >60 >60 mL/min   GFR calc Af Amer >60 >60 mL/min    Comment: (NOTE) The eGFR has been calculated using the CKD EPI equation. This calculation has not been validated in all clinical situations. eGFR's persistently <60 mL/min signify possible Chronic Kidney Disease.    Anion gap 9 5 - 15    Comment: Performed at [facility], [address] [place], [place] [address]  CBC with Differential     Status: Abnormal   Collection Time: 11/11/17  8:51 PM  Result Value Ref Range   WBC 7.5 4.0 -  10.5 K/uL   RBC 4.33 3.87 - 5.11 MIL/uL   Hemoglobin 11.2 (L) 12.0 - 15.0 g/dL   HCT 38.9 36.0 - 46.0 %   MCV 89.8 80.0 - 100.0 fL   MCH 25.9 (L) 26.0 - 34.0 pg   MCHC 28.8 (L) 30.0 - 36.0 g/dL   RDW 15.3 11.5 - 15.5 %   Platelets 196 150 - 400 K/uL   nRBC 0.0 0.0 - 0.2 %   Neutrophils Relative % 84 %   Neutro Abs 6.3 1.7 - 7.7 K/uL   Lymphocytes Relative 5 %   Lymphs Abs 0.3 (L) 0.7 - 4.0 K/uL   Monocytes Relative 7 %   Monocytes Absolute 0.6 0.1 - 1.0 K/uL   Eosinophils Relative 3 %   Eosinophils Absolute 0.2 0.0 - 0.5 K/uL   Basophils Relative 0 %   Basophils Absolute 0.0 0.0 - 0.1 K/uL   Immature Granulocytes 1 %   Abs Immature Granulocytes 0.04 0.00 - 0.07 K/uL    Comment: Performed at [facility], [address], [place], [place] [address]  Troponin I     Status: None   Collection Time: 11/11/17  8:51 PM  Result Value Ref Range   Troponin I <0.03 <0.03 ng/mL    Comment: Performed at [facility], [address], [place], [place] [address]  Brain natriuretic peptide     Status: None   Collection Time: 11/11/17  8:51 PM  Result Value Ref Range   B Natriuretic Peptide 25.0 0.0 - 100.0 pg/mL    Comment: Performed at [facility], [address], [place], [place] [address]    Chemistries  Recent Labs  Lab 11/11/17 2051  NA 136  K 3.5  CL 87*  CO2 40*  GLUCOSE 133*  BUN 11  CREATININE 0.75  CALCIUM 8.8*   ------------------------------------------------------------------------------------------------------------------  ------------------------------------------------------------------------------------------------------------------ GFR: Estimated Creatinine Clearance: 50.3 mL/min (by C-G formula based on SCr of 0.75 mg/dL). Liver Function Tests: No results for input(s): AST, ALT, ALKPHOS, BILITOT, PROT, ALBUMIN in the last 168 hours. No results for input(s): LIPASE, AMYLASE in the last 168 hours. No results for input(s): AMMONIA in the last 168  hours. Coagulation Profile: No results for input(s): INR, PROTIME in the last 168 hours. Cardiac Enzymes: Recent Labs  Lab 11/11/17 2051  TROPONINI <0.03   BNP (last 3 results) No results for input(s): PROBNP in the last 8760 hours. HbA1C: No results for input(s): HGBA1C in the last 72 hours. CBG: No results for input(s): GLUCAP in the last 168 hours. Lipid Profile: No results for input(s): CHOL, HDL, LDLCALC, TRIG, CHOLHDL, LDLDIRECT in the last 72 hours. Thyroid Function Tests: No results for input(s): TSH, T4TOTAL, FREET4, T3FREE, THYROIDAB in the last 72 hours. Anemia  Panel: No results for input(s): VITAMINB12, FOLATE, FERRITIN, TIBC, IRON, RETICCTPCT in the last 72 hours.  ---------------------------------------------------------------------------------------------------------------    Imaging Results:    Dg Chest 2 View  Result Date: 11/11/2017 CLINICAL DATA:  Short of breath EXAM: CHEST - 2 VIEW COMPARISON:  11/04/2017, 10/24/2017, 09/01/2017, 01/22/2017. FINDINGS: Small bilateral pleural effusions. Coarse chronic appearing interstitial opacity. Bibasilar scarring. Emphysematous disease. Patchy airspace disease at the left base, increased compared to prior. Stable cardiomediastinal silhouette with aortic atherosclerosis. No pneumothorax. IMPRESSION: 1. Emphysematous disease with bibasilar scarring and chronic interstitial disease 2. Increased interstitial opacity at both bases may reflect mild superimposed edema. Patchy atelectasis or small infiltrate at the left base. Small pleural effusions. Electronically Signed   By: Donavan Foil M.D.   On: 11/11/2017 21:36    My personal review of EKG: Rhythm NSR   Assessment & Plan:    Active Problems:   COPD exacerbation ([place])   1. COPD exacerbation-start Solu-Medrol 60 mg IV every 6 hours, continue oxygen via nasal cannula, DuoNeb nebulizers every 6 hours.  Mucinex 1 tablet p.o. twice daily.  2. Acute on chronic diastolic  CHF-patient does have chronic diastolic CHF and has not been taking Lasix for past 3 days.  She was given IV Lasix 40 mg in the ED.  Her BNP is 25.  We will continue with Lasix 40 mg p.o. daily from tomorrow morning.  3. Hypertension-blood pressure stable, continue metoprolol  4. Hyperlipidemia-continue Zocor    DVT Prophylaxis-   Lovenox   AM Labs Ordered, also please review Full Orders  Family Communication: Admission, patients condition and plan of care including tests being ordered have been discussed with the patient and her son on phone who indicate understanding and agree with the plan and Code Status.  As per patient's son, her oxygen has been discontinued by [facility].  Oxygen has to be reordered for home when patient is ready for discharge  Code Status: Full code, confirmed with patient and her son on phone.  Patient was initially DNR which has been changed both per patient and her son.  Admission status: Observation  Time spent in minutes : 60 minutes   Oswald Hillock M.D on 11/11/2017 at 11:50 PM  Between 7am to 7pm - Pager - [phone]. After 7pm go to [email or web address] - password [facility]  [facility] - Office  [phone]

## 2017-11-11 NOTE — ED Triage Notes (Signed)
Pt with increased SOB x2days. Per EMS, pt has missed 3 days of Lasix therapy because she "doesn't have a foley catheter and wets the bed". Pt also needs new order for continuous O2 use at home per son. Was in hospice care but per son was "kicked out".
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
## 2017-11-12 ENCOUNTER — Encounter (HOSPITAL_COMMUNITY): Payer: Self-pay

## 2017-11-12 DIAGNOSIS — J9611 Chronic respiratory failure with hypoxia: Secondary | ICD-10-CM | POA: Diagnosis not present

## 2017-11-12 DIAGNOSIS — J441 Chronic obstructive pulmonary disease with (acute) exacerbation: Secondary | ICD-10-CM | POA: Diagnosis not present

## 2017-11-12 DIAGNOSIS — I10 Essential (primary) hypertension: Secondary | ICD-10-CM | POA: Diagnosis not present

## 2017-11-12 DIAGNOSIS — I5032 Chronic diastolic (congestive) heart failure: Secondary | ICD-10-CM | POA: Diagnosis not present

## 2017-11-12 DIAGNOSIS — E7800 Pure hypercholesterolemia, unspecified: Secondary | ICD-10-CM

## 2017-11-12 LAB — COMPREHENSIVE METABOLIC PANEL
ALT: 37 U/L (ref 0–44)
AST: 30 U/L (ref 15–41)
Albumin: 3.2 g/dL — ABNORMAL LOW (ref 3.5–5.0)
Alkaline Phosphatase: 60 U/L (ref 38–126)
Anion gap: 13 (ref 5–15)
BUN: 13 mg/dL (ref 8–23)
CHLORIDE: 81 mmol/L — AB (ref 98–111)
CO2: 41 mmol/L — ABNORMAL HIGH (ref 22–32)
CREATININE: 0.86 mg/dL (ref 0.44–1.00)
Calcium: 8.7 mg/dL — ABNORMAL LOW (ref 8.9–10.3)
Glucose, Bld: 202 mg/dL — ABNORMAL HIGH (ref 70–99)
Potassium: 3.6 mmol/L (ref 3.5–5.1)
Sodium: 135 mmol/L (ref 135–145)
TOTAL PROTEIN: 6.7 g/dL (ref 6.5–8.1)
Total Bilirubin: 0.5 mg/dL (ref 0.3–1.2)

## 2017-11-12 LAB — URINALYSIS, ROUTINE W REFLEX MICROSCOPIC
BACTERIA UA: NONE SEEN
BILIRUBIN URINE: NEGATIVE
GLUCOSE, UA: NEGATIVE mg/dL
Hgb urine dipstick: NEGATIVE
KETONES UR: NEGATIVE mg/dL
Nitrite: NEGATIVE
PH: 7 (ref 5.0–8.0)
PROTEIN: NEGATIVE mg/dL
Specific Gravity, Urine: 1.006 (ref 1.005–1.030)

## 2017-11-12 LAB — CBC
HCT: 38.4 % (ref 36.0–46.0)
Hemoglobin: 11.3 g/dL — ABNORMAL LOW (ref 12.0–15.0)
MCH: 26 pg (ref 26.0–34.0)
MCHC: 29.4 g/dL — ABNORMAL LOW (ref 30.0–36.0)
MCV: 88.3 fL (ref 80.0–100.0)
PLATELETS: 226 10*3/uL (ref 150–400)
RBC: 4.35 MIL/uL (ref 3.87–5.11)
RDW: 15.3 % (ref 11.5–15.5)
WBC: 6.1 10*3/uL (ref 4.0–10.5)
nRBC: 0 % (ref 0.0–0.2)

## 2017-11-12 LAB — MRSA PCR SCREENING: MRSA BY PCR: POSITIVE — AB

## 2017-11-12 MED ORDER — IPRATROPIUM BROMIDE 0.02 % IN SOLN: 0.5000 mg | Freq: Four times a day (QID) | RESPIRATORY_TRACT | Status: DC

## 2017-11-12 MED ORDER — POTASSIUM CHLORIDE CRYS ER 10 MEQ PO TBCR
10.0000 meq | EXTENDED_RELEASE_TABLET | Freq: Every day | ORAL | Status: DC
  Administered 2017-11-12 – 2017-11-15 (×4): 10 meq via ORAL
  Filled 2017-11-12 (×7): qty 1

## 2017-11-12 MED ORDER — FUROSEMIDE 40 MG PO TABS
40.0000 mg | ORAL_TABLET | Freq: Every day | ORAL | Status: DC
  Administered 2017-11-12 – 2017-11-13 (×2): 40 mg via ORAL
  Filled 2017-11-12 (×2): qty 1

## 2017-11-12 MED ORDER — ONDANSETRON HCL 4 MG/2ML IJ SOLN: 4.0000 mg | Freq: Four times a day (QID) | INTRAMUSCULAR | Status: DC | PRN

## 2017-11-12 MED ORDER — ASPIRIN EC 81 MG PO TBEC
81.0000 mg | DELAYED_RELEASE_TABLET | Freq: Every day | ORAL | Status: DC
  Administered 2017-11-12 – 2017-11-15 (×4): 81 mg via ORAL
  Filled 2017-11-12 (×4): qty 1

## 2017-11-12 MED ORDER — IPRATROPIUM-ALBUTEROL 0.5-2.5 (3) MG/3ML IN SOLN
2.5000 mg | Freq: Four times a day (QID) | RESPIRATORY_TRACT | Status: DC
  Administered 2017-11-12: 2.5 mg via RESPIRATORY_TRACT
  Filled 2017-11-12: qty 3

## 2017-11-12 MED ORDER — SODIUM CHLORIDE 0.9 % IV SOLN: 250.0000 mL | INTRAVENOUS | Status: DC | PRN

## 2017-11-12 MED ORDER — GUAIFENESIN ER 600 MG PO TB12
600.0000 mg | ORAL_TABLET | Freq: Two times a day (BID) | ORAL | Status: DC
  Administered 2017-11-12 – 2017-11-15 (×8): 600 mg via ORAL
  Filled 2017-11-12 (×9): qty 1

## 2017-11-12 MED ORDER — SODIUM CHLORIDE 0.9% FLUSH: 3.0000 mL | INTRAVENOUS | Status: DC | PRN

## 2017-11-12 MED ORDER — MUPIROCIN 2 % EX OINT
1.0000 "application " | TOPICAL_OINTMENT | Freq: Two times a day (BID) | CUTANEOUS | Status: DC
  Administered 2017-11-12 – 2017-11-15 (×7): 1 via NASAL
  Filled 2017-11-12: qty 22

## 2017-11-12 MED ORDER — MOMETASONE FURO-FORMOTEROL FUM 200-5 MCG/ACT IN AERO
2.0000 | INHALATION_SPRAY | Freq: Two times a day (BID) | RESPIRATORY_TRACT | Status: DC
  Administered 2017-11-12 – 2017-11-15 (×8): 2 via RESPIRATORY_TRACT
  Filled 2017-11-12: qty 8.8

## 2017-11-12 MED ORDER — CHLORHEXIDINE GLUCONATE CLOTH 2 % EX PADS
6.0000 | MEDICATED_PAD | Freq: Every day | CUTANEOUS | Status: DC
  Administered 2017-11-12 – 2017-11-14 (×3): 6 via TOPICAL

## 2017-11-12 MED ORDER — SIMVASTATIN 10 MG PO TABS
10.0000 mg | ORAL_TABLET | Freq: Every day | ORAL | Status: DC
  Administered 2017-11-12 – 2017-11-15 (×4): 10 mg via ORAL
  Filled 2017-11-12 (×4): qty 1

## 2017-11-12 MED ORDER — BUSPIRONE HCL 5 MG PO TABS
15.0000 mg | ORAL_TABLET | Freq: Two times a day (BID) | ORAL | Status: DC
  Administered 2017-11-12 – 2017-11-15 (×7): 15 mg via ORAL
  Filled 2017-11-12 (×8): qty 3

## 2017-11-12 MED ORDER — DM-GUAIFENESIN ER 30-600 MG PO TB12
1.0000 | ORAL_TABLET | Freq: Two times a day (BID) | ORAL | Status: DC | PRN
  Administered 2017-11-13: 1 via ORAL
  Filled 2017-11-12: qty 1

## 2017-11-12 MED ORDER — ENOXAPARIN SODIUM 40 MG/0.4ML ~~LOC~~ SOLN
40.0000 mg | SUBCUTANEOUS | Status: DC
  Administered 2017-11-12 – 2017-11-15 (×4): 40 mg via SUBCUTANEOUS
  Filled 2017-11-12 (×4): qty 0.4

## 2017-11-12 MED ORDER — METHYLPREDNISOLONE SODIUM SUCC 125 MG IJ SOLR
60.0000 mg | Freq: Four times a day (QID) | INTRAMUSCULAR | Status: DC
  Administered 2017-11-12 – 2017-11-13 (×7): 60 mg via INTRAVENOUS
  Filled 2017-11-12 (×7): qty 2

## 2017-11-12 MED ORDER — IPRATROPIUM-ALBUTEROL 0.5-2.5 (3) MG/3ML IN SOLN
3.0000 mL | Freq: Four times a day (QID) | RESPIRATORY_TRACT | Status: DC
  Administered 2017-11-12 – 2017-11-14 (×9): 3 mL via RESPIRATORY_TRACT
  Filled 2017-11-12 (×9): qty 3

## 2017-11-12 MED ORDER — METOPROLOL TARTRATE 25 MG PO TABS
12.5000 mg | ORAL_TABLET | Freq: Two times a day (BID) | ORAL | Status: DC
  Administered 2017-11-12 – 2017-11-13 (×3): 12.5 mg via ORAL
  Filled 2017-11-12 (×4): qty 1

## 2017-11-12 MED ORDER — SODIUM CHLORIDE 0.9% FLUSH
3.0000 mL | Freq: Two times a day (BID) | INTRAVENOUS | Status: DC
  Administered 2017-11-12 – 2017-11-13 (×5): 3 mL via INTRAVENOUS

## 2017-11-12 MED ORDER — ONDANSETRON HCL 4 MG PO TABS: 4.0000 mg | ORAL_TABLET | Freq: Four times a day (QID) | ORAL | Status: DC | PRN

## 2017-11-12 MED ORDER — INFLUENZA VAC SPLIT HIGH-DOSE 0.5 ML IM SUSY
0.5000 mL | PREFILLED_SYRINGE | INTRAMUSCULAR | Status: AC
  Administered 2017-11-14: 0.5 mL via INTRAMUSCULAR
  Filled 2017-11-12: qty 0.5

## 2017-11-12 NOTE — Progress Notes (Signed)
PROGRESS NOTE    DRAYAH Pierce  KAJ:[id number] DOB: 20-Oct-1945 DOA: 11/11/2017 PCP: Sheila Pierce, No Pcp Per    Brief Narrative:  72 year old female with chronic respiratory failure, COPD and diastolic heart failure, admitted to the hospital with worsening shortness of breath.  Found to have COPD exacerbation.  Started on IV steroids and bronchodilators.   Assessment & Plan:   Principal Problem:   COPD exacerbation (HCC) Active Problems:   Essential hypertension   Hypercholesteremia   Chronic diastolic CHF (congestive heart failure) (HCC)   Chronic respiratory failure with hypoxia (HCC)   1. COPD exacerbation.  Admitted with shortness of breath and wheezing.  Continues to have some degree of wheezing.  She is on intravenous steroids, bronchodilators and inhaled steroids.  Continue current treatments. 2. Chronic respiratory failure secondary to COPD.  She is chronically on 4 L of oxygen.  Currently requiring 5 L.  We will try and wean back down to baseline as tolerated. 3. Chronic diastolic CHF.  Appears compensated at this time.  Continue on home dose of Lasix. 4. Hypertension.  Blood pressure currently stable.  Continue home regimen. 5. Hyper lipidemia.  Continue on statin   DVT prophylaxis: Lovenox Code Status: Full code Family Communication: No family present Disposition Plan: Discharge home once improved   Consultants:     Procedures:     Antimicrobials:       Subjective: Continues to feel short of breath.  Does not feel that her breathing is back to baseline.  Continues to wheeze.  Objective: Vitals:   11/12/17 0444 11/12/17 1042 11/12/17 1433 11/12/17 1452  BP: 103/68 109/64  108/66  Pulse: 92 (!) 109  92  Resp: 16   18  Temp: 98.6 F (37 C)   98.5 F (36.9 C)  TempSrc: Oral   Oral  SpO2: 95% 99% 95% 98%  Weight:      Height:        Intake/Output Summary (Last 24 hours) at 11/12/2017 1905 Last data filed at 11/12/2017 1300 Gross per 24 hour    Intake 765.11 ml  Output -  Net 765.11 ml   Filed Weights   11/11/17 2021  Weight: 59 kg    Examination:  General exam: Appears calm and comfortable  Respiratory system: Mild bilateral wheeze.  Increased respiratory effort. Cardiovascular system: S1 & S2 heard, RRR. No JVD, murmurs, rubs, gallops or clicks. No pedal edema. Gastrointestinal system: Abdomen is nondistended, soft and nontender. No organomegaly or masses felt. Normal bowel sounds heard. Central nervous system: Alert and oriented. No focal neurological deficits. Extremities: Symmetric 5 x 5 power. Skin: No rashes, lesions or ulcers Psychiatry: Judgement and insight appear normal. Mood & affect appropriate.     Data Reviewed: I have personally reviewed following labs and imaging studies  CBC: Recent Labs  Lab 11/11/17 2051 11/12/17 0637  WBC 7.5 6.1  NEUTROABS 6.3  --   HGB 11.2* 11.3*  HCT 38.9 38.4  MCV 89.8 88.3  PLT 196 226   Basic Metabolic Panel: Recent Labs  Lab 11/11/17 2051 11/12/17 0637  NA 136 135  K 3.5 3.6  CL 87* 81*  CO2 40* 41*  GLUCOSE 133* 202*  BUN 11 13  CREATININE 0.75 0.86  CALCIUM 8.8* 8.7*   GFR: Estimated Creatinine Clearance: 46.8 mL/min (by C-G formula based on SCr of 0.86 mg/dL). Liver Function Tests: Recent Labs  Lab 11/12/17 0637  AST 30  ALT 37  ALKPHOS 60  BILITOT 0.5  PROT 6.7  ALBUMIN 3.2*   No results for input(s): LIPASE, AMYLASE in the last 168 hours. No results for input(s): AMMONIA in the last 168 hours. Coagulation Profile: No results for input(s): INR, PROTIME in the last 168 hours. Cardiac Enzymes: Recent Labs  Lab 11/11/17 2051  TROPONINI <0.03   BNP (last 3 results) No results for input(s): PROBNP in the last 8760 hours. HbA1C: No results for input(s): HGBA1C in the last 72 hours. CBG: No results for input(s): GLUCAP in the last 168 hours. Lipid Profile: No results for input(s): CHOL, HDL, LDLCALC, TRIG, CHOLHDL, LDLDIRECT in the  last 72 hours. Thyroid Function Tests: No results for input(s): TSH, T4TOTAL, FREET4, T3FREE, THYROIDAB in the last 72 hours. Anemia Panel: No results for input(s): VITAMINB12, FOLATE, FERRITIN, TIBC, IRON, RETICCTPCT in the last 72 hours. Sepsis Labs: No results for input(s): PROCALCITON, LATICACIDVEN in the last 168 hours.  Recent Results (from the past 240 hour(s))  MRSA PCR Screening     Status: Abnormal   Collection Time: 11/12/17  1:51 AM  Result Value Ref Range Status   MRSA by PCR POSITIVE (A) NEGATIVE Final    Comment:        The GeneXpert MRSA Assay (FDA approved for NASAL specimens only), is one component of a comprehensive MRSA colonization surveillance program. It is not intended to diagnose MRSA infection nor to guide or monitor treatment for MRSA infections. RESULT CALLED TO, READ BACK BY AND VERIFIED WITH: MARTIN,J @ 0442 ON 11/12/17 BY JUW Performed at [facility], [address], [place], [place] [address]          Radiology Studies: Dg Chest 2 View  Result Date: 11/11/2017 CLINICAL DATA:  Short of breath EXAM: CHEST - 2 VIEW COMPARISON:  11/04/2017, 10/24/2017, 09/01/2017, 01/22/2017. FINDINGS: Small bilateral pleural effusions. Coarse chronic appearing interstitial opacity. Bibasilar scarring. Emphysematous disease. Patchy airspace disease at the left base, increased compared to prior. Stable cardiomediastinal silhouette with aortic atherosclerosis. No pneumothorax. IMPRESSION: 1. Emphysematous disease with bibasilar scarring and chronic interstitial disease 2. Increased interstitial opacity at both bases may reflect mild superimposed edema. Patchy atelectasis or small infiltrate at the left base. Small pleural effusions. Electronically Signed   By: Jasmine Pang M.D.   On: 11/11/2017 21:36        Scheduled Meds: . aspirin EC  81 mg Oral Daily  . busPIRone  15 mg Oral BID  . Chlorhexidine Gluconate Cloth  6 each Topical Q0600  . enoxaparin (LOVENOX)  injection  40 mg Subcutaneous Q24H  . furosemide  40 mg Oral Daily  . guaiFENesin  600 mg Oral BID  . [START ON 11/13/2017] Influenza vac split quadrivalent PF  0.5 mL Intramuscular Tomorrow-1000  . ipratropium-albuterol  3 mL Nebulization Q6H  . methylPREDNISolone (SOLU-MEDROL) injection  60 mg Intravenous Q6H  . metoprolol tartrate  12.5 mg Oral BID  . mometasone-formoterol  2 puff Inhalation BID  . mupirocin ointment  1 application Nasal BID  . potassium chloride  10 mEq Oral Daily  . simvastatin  10 mg Oral Daily  . sodium chloride flush  3 mL Intravenous Q12H   Continuous Infusions: . sodium chloride       LOS: 0 days    Time spent:    Erick Blinks, MD [facility] Pager [phone]  If 7PM-7AM, please contact night-coverage [email or web address] Password [email or web address] 11/12/2017, 7:05 PM

## 2017-11-12 NOTE — Progress Notes (Signed)
CRITICAL VALUE ALERT  Critical Value MRSA Positive   Date & Time Notied:  11/12/2017 0500  Provider Notified: Lama,G    Orders Received/Actions taken: initiated contact precautions

## 2017-11-13 DIAGNOSIS — E7800 Pure hypercholesterolemia, unspecified: Secondary | ICD-10-CM | POA: Diagnosis not present

## 2017-11-13 DIAGNOSIS — I5032 Chronic diastolic (congestive) heart failure: Secondary | ICD-10-CM | POA: Diagnosis not present

## 2017-11-13 DIAGNOSIS — I10 Essential (primary) hypertension: Secondary | ICD-10-CM | POA: Diagnosis not present

## 2017-11-13 DIAGNOSIS — J441 Chronic obstructive pulmonary disease with (acute) exacerbation: Secondary | ICD-10-CM | POA: Diagnosis not present

## 2017-11-13 DIAGNOSIS — J9611 Chronic respiratory failure with hypoxia: Secondary | ICD-10-CM | POA: Diagnosis not present

## 2017-11-13 DIAGNOSIS — L8990 Pressure ulcer of unspecified site, unspecified stage: Secondary | ICD-10-CM

## 2017-11-13 LAB — URINE CULTURE

## 2017-11-13 MED ORDER — PREDNISONE 20 MG PO TABS
40.0000 mg | ORAL_TABLET | Freq: Every day | ORAL | Status: DC
  Administered 2017-11-14 – 2017-11-15 (×2): 40 mg via ORAL
  Filled 2017-11-13 (×2): qty 2

## 2017-11-13 MED ORDER — METOPROLOL TARTRATE 25 MG PO TABS
25.0000 mg | ORAL_TABLET | ORAL | Status: AC
  Administered 2017-11-13: 25 mg via ORAL
  Filled 2017-11-13: qty 1

## 2017-11-13 MED ORDER — METOPROLOL TARTRATE 25 MG PO TABS
25.0000 mg | ORAL_TABLET | Freq: Two times a day (BID) | ORAL | Status: DC
  Administered 2017-11-13 – 2017-11-15 (×4): 25 mg via ORAL
  Filled 2017-11-13 (×4): qty 1

## 2017-11-13 NOTE — Progress Notes (Signed)
PROGRESS NOTE    Sheila Pierce  ZOX:[id number] DOB: 02-Apr-1945 DOA: 11/11/2017 PCP: Patient, No Pcp Per    Brief Narrative:  72 year old female with chronic respiratory failure, COPD and diastolic heart failure, admitted to the hospital with worsening shortness of breath.  Found to have COPD exacerbation.  Started on IV steroids and bronchodilators.   Assessment & Plan:   Principal Problem:   COPD exacerbation (HCC) Active Problems:   Essential hypertension   Hypercholesteremia   Chronic diastolic CHF (congestive heart failure) (HCC)   Chronic respiratory failure with hypoxia (HCC)   Pressure injury of skin   1. COPD exacerbation.  Admitted with shortness of breath and wheezing.  Overall wheezing is improving.  Will transition Solu-Medrol to prednisone taper.  Continue bronchodilators. 2. Chronic respiratory failure secondary to COPD.  She is chronically on 4 L of oxygen.  Currently requiring 3 L.   3. Chronic diastolic CHF.  Appears compensated at this time.  Continue on home dose of Lasix. 4. Hypertension.  Blood pressure currently stable.  Continue home regimen. 5. Hyperlipidemia.  Continue on statin   DVT prophylaxis: Lovenox Code Status: DNR, confirmed with patient and family Family Communication: Discussed with son is at the bedside Disposition Plan: Can anticipate discharge in the next 24 hours.  Her son says that they will need assistance with setting up multiple resources at home including home oxygen, nebulizer and other items.  Will request case management assistance.   Consultants:     Procedures:     Antimicrobials:       Subjective: Feels that her breathing is better.  Family says that she is breathing close to her baseline.  Objective: Vitals:   11/13/17 1007 11/13/17 1315 11/13/17 1355 11/13/17 1500  BP: 131/73 (!) 165/119  137/61  Pulse: (!) 120 (!) 101  92  Resp: 20 18    Temp: 97.8 F (36.6 C) 98.4 F (36.9 C)    TempSrc: Oral Oral      SpO2: 95% 96% 96%   Weight:      Height:        Intake/Output Summary (Last 24 hours) at 11/13/2017 1855 Last data filed at 11/13/2017 1753 Gross per 24 hour  Intake 1200 ml  Output 400 ml  Net 800 ml   Filed Weights   11/11/17 2021  Weight: 59 kg    Examination:  General exam: Appears calm and comfortable  Respiratory system: Diminished breath sounds bilaterally, no wheezing.  She is pursed lip breathing. Cardiovascular system: S1 & S2 heard, RRR. No JVD, murmurs, rubs, gallops or clicks. No pedal edema. Gastrointestinal system: Abdomen is nondistended, soft and nontender. No organomegaly or masses felt. Normal bowel sounds heard. Central nervous system: Alert and oriented. No focal neurological deficits. Extremities: Symmetric 5 x 5 power. Skin: No rashes, lesions or ulcers Psychiatry: Judgement and insight appear normal. Mood & affect appropriate.     Data Reviewed: I have personally reviewed following labs and imaging studies  CBC: Recent Labs  Lab 11/11/17 2051 11/12/17 0637  WBC 7.5 6.1  NEUTROABS 6.3  --   HGB 11.2* 11.3*  HCT 38.9 38.4  MCV 89.8 88.3  PLT 196 226   Basic Metabolic Panel: Recent Labs  Lab 11/11/17 2051 11/12/17 0637  NA 136 135  K 3.5 3.6  CL 87* 81*  CO2 40* 41*  GLUCOSE 133* 202*  BUN 11 13  CREATININE 0.75 0.86  CALCIUM 8.8* 8.7*   GFR: Estimated Creatinine Clearance: 46.8 mL/min (  by C-G formula based on SCr of 0.86 mg/dL). Liver Function Tests: Recent Labs  Lab 11/12/17 0637  AST 30  ALT 37  ALKPHOS 60  BILITOT 0.5  PROT 6.7  ALBUMIN 3.2*   No results for input(s): LIPASE, AMYLASE in the last 168 hours. No results for input(s): AMMONIA in the last 168 hours. Coagulation Profile: No results for input(s): INR, PROTIME in the last 168 hours. Cardiac Enzymes: Recent Labs  Lab 11/11/17 2051  TROPONINI <0.03   BNP (last 3 results) No results for input(s): PROBNP in the last 8760 hours. HbA1C: No results for  input(s): HGBA1C in the last 72 hours. CBG: No results for input(s): GLUCAP in the last 168 hours. Lipid Profile: No results for input(s): CHOL, HDL, LDLCALC, TRIG, CHOLHDL, LDLDIRECT in the last 72 hours. Thyroid Function Tests: No results for input(s): TSH, T4TOTAL, FREET4, T3FREE, THYROIDAB in the last 72 hours. Anemia Panel: No results for input(s): VITAMINB12, FOLATE, FERRITIN, TIBC, IRON, RETICCTPCT in the last 72 hours. Sepsis Labs: No results for input(s): PROCALCITON, LATICACIDVEN in the last 168 hours.  Recent Results (from the past 240 hour(s))  Culture, Urine     Status: Abnormal   Collection Time: 11/12/17 12:15 AM  Result Value Ref Range Status   Specimen Description   Final    URINE, CLEAN CATCH Performed at [facility], [address] [place], [place] [address]    Special Requests   Final    NONE Performed at [facility], [address], [place], [place] [address]    Culture MULTIPLE SPECIES PRESENT, SUGGEST RECOLLECTION (A)  Final   Report Status 11/13/2017 FINAL  Final  MRSA PCR Screening     Status: Abnormal   Collection Time: 11/12/17  1:51 AM  Result Value Ref Range Status   MRSA by PCR POSITIVE (A) NEGATIVE Final    Comment:        The GeneXpert MRSA Assay (FDA approved for NASAL specimens only), is one component of a comprehensive MRSA colonization surveillance program. It is not intended to diagnose MRSA infection nor to guide or monitor treatment for MRSA infections. RESULT CALLED TO, READ BACK BY AND VERIFIED WITH: MARTIN,J @ 0442 ON 11/12/17 BY JUW Performed at [facility], [address], [place], [place] [address]          Radiology Studies: Dg Chest 2 View  Result Date: 11/11/2017 CLINICAL DATA:  Short of breath EXAM: CHEST - 2 VIEW COMPARISON:  11/04/2017, 10/24/2017, 09/01/2017, 01/22/2017. FINDINGS: Small bilateral pleural effusions. Coarse chronic appearing interstitial opacity. Bibasilar scarring. Emphysematous disease. Patchy  airspace disease at the left base, increased compared to prior. Stable cardiomediastinal silhouette with aortic atherosclerosis. No pneumothorax. IMPRESSION: 1. Emphysematous disease with bibasilar scarring and chronic interstitial disease 2. Increased interstitial opacity at both bases may reflect mild superimposed edema. Patchy atelectasis or small infiltrate at the left base. Small pleural effusions. Electronically Signed   By: Jasmine Pang M.D.   On: 11/11/2017 21:36        Scheduled Meds: . aspirin EC  81 mg Oral Daily  . busPIRone  15 mg Oral BID  . Chlorhexidine Gluconate Cloth  6 each Topical Q0600  . enoxaparin (LOVENOX) injection  40 mg Subcutaneous Q24H  . guaiFENesin  600 mg Oral BID  . Influenza vac split quadrivalent PF  0.5 mL Intramuscular Tomorrow-1000  . ipratropium-albuterol  3 mL Nebulization Q6H  . metoprolol tartrate  25 mg Oral BID  . mometasone-formoterol  2 puff Inhalation BID  .  mupirocin ointment  1 application Nasal BID  . potassium chloride  10 mEq Oral Daily  . [START ON 11/14/2017] predniSONE  40 mg Oral Q breakfast  . simvastatin  10 mg Oral Daily  . sodium chloride flush  3 mL Intravenous Q12H   Continuous Infusions: . sodium chloride       LOS: 0 days    Time spent:    Erick Blinks, MD [facility] Pager [phone]  If 7PM-7AM, please contact night-coverage [email or web address] Password [facility] 11/13/2017, 6:55 PM

## 2017-11-14 DIAGNOSIS — J9611 Chronic respiratory failure with hypoxia: Secondary | ICD-10-CM | POA: Diagnosis not present

## 2017-11-14 DIAGNOSIS — I5032 Chronic diastolic (congestive) heart failure: Secondary | ICD-10-CM | POA: Diagnosis not present

## 2017-11-14 DIAGNOSIS — E7800 Pure hypercholesterolemia, unspecified: Secondary | ICD-10-CM | POA: Diagnosis not present

## 2017-11-14 DIAGNOSIS — J441 Chronic obstructive pulmonary disease with (acute) exacerbation: Secondary | ICD-10-CM | POA: Diagnosis not present

## 2017-11-14 DIAGNOSIS — I10 Essential (primary) hypertension: Secondary | ICD-10-CM | POA: Diagnosis not present

## 2017-11-14 MED ORDER — BUSPIRONE HCL 15 MG PO TABS: 15.0000 mg | ORAL_TABLET | Freq: Two times a day (BID) | ORAL | 1 refills | Status: AC

## 2017-11-14 MED ORDER — IPRATROPIUM-ALBUTEROL 0.5-2.5 (3) MG/3ML IN SOLN
3.0000 mL | Freq: Three times a day (TID) | RESPIRATORY_TRACT | Status: DC
  Administered 2017-11-15 (×3): 3 mL via RESPIRATORY_TRACT
  Filled 2017-11-14 (×2): qty 3

## 2017-11-14 MED ORDER — PREDNISONE 10 MG PO TABS: ORAL_TABLET | ORAL | 0 refills | Status: AC

## 2017-11-14 MED ORDER — POTASSIUM CHLORIDE ER 10 MEQ PO TBCR: 10.0000 meq | EXTENDED_RELEASE_TABLET | Freq: Every day | ORAL | 3 refills | Status: AC

## 2017-11-14 MED ORDER — SIMVASTATIN 10 MG PO TABS: 10.0000 mg | ORAL_TABLET | Freq: Every day | ORAL | 0 refills | Status: AC

## 2017-11-14 MED ORDER — BUDESONIDE-FORMOTEROL FUMARATE 160-4.5 MCG/ACT IN AERO: 2.0000 | INHALATION_SPRAY | Freq: Two times a day (BID) | RESPIRATORY_TRACT | 12 refills | Status: AC

## 2017-11-14 MED ORDER — ALBUTEROL SULFATE HFA 108 (90 BASE) MCG/ACT IN AERS: INHALATION_SPRAY | RESPIRATORY_TRACT | 5 refills | Status: AC

## 2017-11-14 MED ORDER — ASPIRIN EC 81 MG PO TBEC: 81.0000 mg | DELAYED_RELEASE_TABLET | Freq: Every day | ORAL | 0 refills | Status: AC

## 2017-11-14 MED ORDER — NYSTATIN 100000 UNIT/GM EX POWD
Freq: Two times a day (BID) | CUTANEOUS | Status: DC
  Administered 2017-11-15: 10:00:00 via TOPICAL
  Filled 2017-11-14: qty 15

## 2017-11-14 MED ORDER — FUROSEMIDE 20 MG PO TABS: 40.0000 mg | ORAL_TABLET | Freq: Every day | ORAL | 0 refills | Status: AC

## 2017-11-14 MED ORDER — BREO ELLIPTA 100-25 MCG/INH IN AEPB: 1.0000 | INHALATION_SPRAY | Freq: Every day | RESPIRATORY_TRACT | 1 refills | Status: AC

## 2017-11-14 MED ORDER — DM-GUAIFENESIN ER 30-600 MG PO TB12: 1.0000 | ORAL_TABLET | Freq: Two times a day (BID) | ORAL | 0 refills | Status: AC | PRN

## 2017-11-14 MED ORDER — IPRATROPIUM-ALBUTEROL 0.5-2.5 (3) MG/3ML IN SOLN: 3.0000 mL | RESPIRATORY_TRACT | 5 refills | Status: AC

## 2017-11-14 MED ORDER — METOPROLOL TARTRATE 25 MG PO TABS: 25.0000 mg | ORAL_TABLET | Freq: Two times a day (BID) | ORAL | 0 refills | Status: AC

## 2017-11-14 NOTE — Progress Notes (Signed)
Patient's IV removed and site intact. Patient discharging via EMS

## 2017-11-14 NOTE — Care Management Note (Addendum)
Case Management Note  Patient Details  Name: Sheila Pierce MRN: [id number] Date of Birth: Mar 01, 1945  Subjective/Objective:     Patient from home with two sons. COPD exacerbation. Patient declines SNF placement. She does not have a current PCP. She was recently active with [facility] and was discharged due to issues with son making inappropriate threats to staffs family member. Patient does still have oxgyen concentrator and small oxygen tanks (provided by [employer], paid for by [facility] until patient can get oxygen elsewhere). Discussed with patient's son -POA, [name], he reports patient has had an appt with [facility] in the past. CM will call to make appt. For f/u.            Patient will not be sent home with home health referral as patient has been discharged from [facility] and [facility] and patient does not have a current PCP to follow for orders. Patient updated and agreeable. She reports she is not agreeable to placement as her family utilizes her check.  [name] is disabled per patient and does not work. [name] works at night driving for [employer].   Patient has WC, RW, cane, BSC, oxygen, and nebulizer machine at home.      Will make [facility] referral for COPD. Discussed with patient that [facility] had been trying to contact her. We updated her contact list to remove [phone]. Patient gives consent for [facility] to call [name] POA.   Patient to DC home today. [name] and [name] at home to receive patient.   Patient has Medicare and Medicaid.   Action/Plan: DC home.   Expected Discharge Date:      11/14/2017            Expected Discharge Plan:  Home/Self Care  In-House Referral:     Discharge planning Services  CM Consult  Post Acute Care Choice:    Choice offered to:  Patient  DME Arranged:    DME Agency:     HH Arranged:    HH Agency:     Status of Service:  Completed, signed off  If discussed at [employer] of Stay Meetings, dates discussed:     Additional Comments:  [name], Chrystine Oiler, RN 11/14/2017, 2:19 PM

## 2017-11-14 NOTE — Clinical Social Work Note (Signed)
Review of chart indicates that patient's POA is planning on discharge home with [facility] services.  LCSW will follow up with CM to ensure that patient's needs are HH.    Patient was recently discharged from hospital and all medications have been reviewed.

## 2017-11-14 NOTE — Clinical Social Work Note (Signed)
Patient lives at home with her two sons.  She is not agreeable to SNF. She uses a wheelchair, is on chronic oxygen, and has ADLs assisted by her son, [name]. She states that he will not clean her private areas. She stated that she is incontinent because she cannot get on her potty chair and cannot wipe herself.  LCSW discussed that all the things that patient discussed were the reasons that she needed SNF.  Patient stated "Well I'm not going." Patient stated that if she goes in to placement her check will be taken and that her family needs her check. She stated that one of her sons works but he cannot do it on his on. Her other son in the home is seeking disability.   Patient is not agreeable to SNF.   LCSW signing off.

## 2017-11-14 NOTE — Discharge Summary (Signed)
Physician Discharge Summary  TIMMYA BLAZIER ZOX:[id number] DOB: December 03, 1945 DOA: 11/11/2017  PCP: Patient, No Pcp Per  Admit date: 11/11/2017 Discharge date: 11/14/2017  Admitted From: Home Disposition: Home  Recommendations for Outpatient Follow-up:  1. Patient has been referred to PCP 2. Unfortunately, she did not be set up since she is already been discharged from [place], [place], [facility] 3. Patient declines SNF placement  Home Health: Equipment/Devices:  Discharge Condition: Stable CODE STATUS: DNR Diet recommendation: Heart healthy  Brief/Interim Summary: 72 year old female with chronic respiratory failure, chronically on 4 L, COPD, diastolic heart failure, admitted to the hospital with worsening shortness of breath.  Found to have COPD exacerbation.  She was treated with intravenous steroids, bronchodilators and pulmonary hygiene.  Overall shortness of breath has improved and appears to be approaching baseline.  She has chronic diastolic failure was also appears to be at baseline.  She is continued on her home dose of Lasix.  The remainder of her medical problems have remained stable.  She is been transitioned to prednisone taper.  Patient does have several social situations.  She lives at home with her 2 sons.  She was previously followed by [facility] hospice, but was discharged from their service due to issues with son making inappropriate threats to staff's family member.  She was offered placement, but refused since she says her family relies on her check.  Referral was made to [facility].  Discharge Diagnoses:  Principal Problem:   COPD exacerbation (HCC) Active Problems:   Essential hypertension   Hypercholesteremia   Chronic diastolic CHF (congestive heart failure) (HCC)   Chronic respiratory failure with hypoxia (HCC)   Pressure injury of skin    Discharge Instructions  Discharge Instructions    Diet - low sodium heart healthy   Complete by:  As directed    Increase  activity slowly   Complete by:  As directed      Allergies as of 11/14/2017      Reactions   Penicillins Swelling, Other (See Comments)   Reaction:  Unspecified swelling reaction Has patient had a PCN reaction causing immediate rash, facial/tongue/throat swelling, SOB or lightheadedness with hypotension: Yes Has patient had a PCN reaction causing severe rash involving mucus membranes or skin necrosis: No Has patient had a PCN reaction that required hospitalization No Has patient had a PCN reaction occurring within the last 10 years: Yes If all of the above answers are "NO", then may proceed with Cephalosporin use.   Sulfa Antibiotics Hives, Other (See Comments)   Reaction:  Hallucinations   Codeine Nausea Only   Hydrocodone Nausea Only   Levaquin [levofloxacin] Itching, Swelling, Rash      Medication List    TAKE these medications   albuterol 108 (90 Base) MCG/ACT inhaler Commonly known as:  PROVENTIL HFA;VENTOLIN HFA INHALE 2 PUFFS EVERY FOUR HOURS AS NEEDED FOR WHEEZING OR SHORTNESS OF BREATH   aspirin EC 81 MG tablet Take 1 tablet (81 mg total) by mouth daily.   BREO ELLIPTA 100-25 MCG/INH Aepb Generic drug:  fluticasone furoate-vilanterol Inhale 1 puff into the lungs daily.   budesonide-formoterol 160-4.5 MCG/ACT inhaler Commonly known as:  SYMBICORT Inhale 2 puffs into the lungs 2 (two) times daily. What changed:  when to take this   busPIRone 15 MG tablet Commonly known as:  BUSPAR Take 1 tablet (15 mg total) by mouth 2 (two) times daily.   cholecalciferol 1000 units tablet Commonly known as:  VITAMIN D Take 1,000 Units by mouth daily.  dextromethorphan-guaiFENesin 30-600 MG 12hr tablet Commonly known as:  MUCINEX DM Take 1 tablet by mouth 2 (two) times daily as needed for cough.   furosemide 20 MG tablet Commonly known as:  LASIX Take 2 tablets (40 mg total) by mouth daily.   ipratropium-albuterol 0.5-2.5 (3) MG/3ML Soln Commonly known as:  DUONEB Take  3 mLs by nebulization every 4 (four) hours.   MELATIN 3-1 MG Tabs Generic drug:  Melatonin-Pyridoxine Take 6 mg by mouth at bedtime.   metoprolol tartrate 25 MG tablet Commonly known as:  LOPRESSOR Take 1 tablet (25 mg total) by mouth 2 (two) times daily. What changed:  how much to take   multivitamin with minerals Tabs tablet Take 1 tablet by mouth daily.   potassium chloride 10 MEQ tablet Commonly known as:  K-DUR Take 1 tablet (10 mEq total) by mouth daily.   predniSONE 10 MG tablet Commonly known as:  DELTASONE Take 40mg  po daily for 2 days then 30mg  daily for 2 days then 20mg  daily for 2 days then 10mg  daily for 2 days then stop   simvastatin 10 MG tablet Commonly known as:  ZOCOR Take 1 tablet (10 mg total) by mouth daily.   traMADol 50 MG tablet Commonly known as:  ULTRAM Take 1 tablet (50 mg total) by mouth every 6 (six) hours as needed for moderate pain or severe pain.            Durable Medical Equipment  (From admission, onward)         Start     Ordered   11/14/17 1446  For home use only DME Nebulizer machine  Once    Question:  Patient needs a nebulizer to treat with the following condition  Answer:  COPD (chronic obstructive pulmonary disease) (HCC)   11/14/17 1445         Follow-up Information    Gwenlyn Fudge, FNP Follow up.   Specialty:  Family Medicine Why:  please call and make an appointment to establish care- you will need PCP in order to get your oxygen changed back to [facility] information: [address] [place] [address] [phone]          Allergies  Allergen Reactions  . Penicillins Swelling and Other (See Comments)    Reaction:  Unspecified swelling reaction Has patient had a PCN reaction causing immediate rash, facial/tongue/throat swelling, SOB or lightheadedness with hypotension: Yes Has patient had a PCN reaction causing severe rash involving mucus membranes or skin necrosis: No Has patient had a  PCN reaction that required hospitalization No Has patient had a PCN reaction occurring within the last 10 years: Yes If all of the above answers are "NO", then may proceed with Cephalosporin use.  . Sulfa Antibiotics Hives and Other (See Comments)    Reaction:  Hallucinations   . Codeine Nausea Only  . Hydrocodone Nausea Only  . Levaquin [Levofloxacin] Itching, Swelling and Rash    Consultations:     Procedures/Studies: Dg Chest 2 View  Result Date: 11/11/2017 CLINICAL DATA:  Short of breath EXAM: CHEST - 2 VIEW COMPARISON:  11/04/2017, 10/24/2017, 09/01/2017, 01/22/2017. FINDINGS: Small bilateral pleural effusions. Coarse chronic appearing interstitial opacity. Bibasilar scarring. Emphysematous disease. Patchy airspace disease at the left base, increased compared to prior. Stable cardiomediastinal silhouette with aortic atherosclerosis. No pneumothorax. IMPRESSION: 1. Emphysematous disease with bibasilar scarring and chronic interstitial disease 2. Increased interstitial opacity at both bases may reflect mild superimposed edema. Patchy atelectasis or small  infiltrate at the left base. Small pleural effusions. Electronically Signed   By: Jasmine Pang M.D.   On: 11/11/2017 21:36      Subjective: Feels that breathing is back to baseline  Discharge Exam: Vitals:   11/14/17 0841 11/14/17 1345 11/14/17 1503 11/14/17 1939  BP:  (!) 145/48    Pulse:  98    Resp:  18    Temp:  98.5 F (36.9 C)    TempSrc:  Oral    SpO2: 99% 98% 95% 98%  Weight:      Height:        General: Pt is alert, awake, not in acute distress Cardiovascular: RRR, S1/S2 +, no rubs, no gallops Respiratory: diminished breath sounds bilaterally Abdominal: Soft, NT, ND, bowel sounds + Extremities: no edema, no cyanosis    The results of significant diagnostics from this hospitalization (including imaging, microbiology, ancillary and laboratory) are listed below for reference.     Microbiology: Recent  Results (from the past 240 hour(s))  Culture, Urine     Status: Abnormal   Collection Time: 11/12/17 12:15 AM  Result Value Ref Range Status   Specimen Description   Final    URINE, CLEAN CATCH Performed at [facility], [address] [place], [place] [address]    Special Requests   Final    NONE Performed at [facility], [address], [place], [place] [address]    Culture MULTIPLE SPECIES PRESENT, SUGGEST RECOLLECTION (A)  Final   Report Status 11/13/2017 FINAL  Final  MRSA PCR Screening     Status: Abnormal   Collection Time: 11/12/17  1:51 AM  Result Value Ref Range Status   MRSA by PCR POSITIVE (A) NEGATIVE Final    Comment:        The GeneXpert MRSA Assay (FDA approved for NASAL specimens only), is one component of a comprehensive MRSA colonization surveillance program. It is not intended to diagnose MRSA infection nor to guide or monitor treatment for MRSA infections. RESULT CALLED TO, READ BACK BY AND VERIFIED WITH: MARTIN,J @ 0442 ON 11/12/17 BY JUW Performed at [facility], [address], [place], [place] [address]      Labs: BNP (last 3 results) Recent Labs    12/16/16 2153 01/01/17 1558 11/11/17 2051  BNP 18.0 20.0 25.0   Basic Metabolic Panel: Recent Labs  Lab 11/11/17 2051 11/12/17 0637  NA 136 135  K 3.5 3.6  CL 87* 81*  CO2 40* 41*  GLUCOSE 133* 202*  BUN 11 13  CREATININE 0.75 0.86  CALCIUM 8.8* 8.7*   Liver Function Tests: Recent Labs  Lab 11/12/17 0637  AST 30  ALT 37  ALKPHOS 60  BILITOT 0.5  PROT 6.7  ALBUMIN 3.2*   No results for input(s): LIPASE, AMYLASE in the last 168 hours. No results for input(s): AMMONIA in the last 168 hours. CBC: Recent Labs  Lab 11/11/17 2051 11/12/17 0637  WBC 7.5 6.1  NEUTROABS 6.3  --   HGB 11.2* 11.3*  HCT 38.9 38.4  MCV 89.8 88.3  PLT 196 226   Cardiac Enzymes: Recent Labs  Lab 11/11/17 2051  TROPONINI <0.03   BNP: Invalid input(s): POCBNP CBG: No results for input(s):  GLUCAP in the last 168 hours. D-Dimer No results for input(s): DDIMER in the last 72 hours. Hgb A1c No results for input(s): HGBA1C in the last 72 hours. Lipid Profile No results for input(s): CHOL, HDL, LDLCALC, TRIG, CHOLHDL, LDLDIRECT in the last 72 hours. Thyroid  function studies No results for input(s): TSH, T4TOTAL, T3FREE, THYROIDAB in the last 72 hours.  Invalid input(s): FREET3 Anemia work up No results for input(s): VITAMINB12, FOLATE, FERRITIN, TIBC, IRON, RETICCTPCT in the last 72 hours. Urinalysis    Component Value Date/Time   COLORURINE STRAW (A) 11/12/2017 0015   APPEARANCEUR CLEAR 11/12/2017 0015   LABSPEC 1.006 11/12/2017 0015   PHURINE 7.0 11/12/2017 0015   GLUCOSEU NEGATIVE 11/12/2017 0015   HGBUR NEGATIVE 11/12/2017 0015   BILIRUBINUR NEGATIVE 11/12/2017 0015   BILIRUBINUR neg 11/24/2016 1131   KETONESUR NEGATIVE 11/12/2017 0015   PROTEINUR NEGATIVE 11/12/2017 0015   UROBILINOGEN 0.2 11/24/2016 1131   NITRITE NEGATIVE 11/12/2017 0015   LEUKOCYTESUR SMALL (A) 11/12/2017 0015   Sepsis Labs Invalid input(s): PROCALCITONIN,  WBC,  LACTICIDVEN Microbiology Recent Results (from the past 240 hour(s))  Culture, Urine     Status: Abnormal   Collection Time: 11/12/17 12:15 AM  Result Value Ref Range Status   Specimen Description   Final    URINE, CLEAN CATCH Performed at [facility], [address] [place], [place] [address]    Special Requests   Final    NONE Performed at [facility], [address] [place], [place] [address]    Culture MULTIPLE SPECIES PRESENT, SUGGEST RECOLLECTION (A)  Final   Report Status 11/13/2017 FINAL  Final  MRSA PCR Screening     Status: Abnormal   Collection Time: 11/12/17  1:51 AM  Result Value Ref Range Status   MRSA by PCR POSITIVE (A) NEGATIVE Final    Comment:        The GeneXpert MRSA Assay (FDA approved for NASAL specimens only), is one component of a comprehensive MRSA colonization surveillance program. It is  not intended to diagnose MRSA infection nor to guide or monitor treatment for MRSA infections. RESULT CALLED TO, READ BACK BY AND VERIFIED WITH: MARTIN,J @ 0442 ON 11/12/17 BY JUW Performed at [facility], [address] [place], [place] [address]      Time coordinating discharge:  SIGNED:   Erick Blinks, MD  [facility] 11/14/2017, 8:24 PM Pager   If 7PM-7AM, please contact night-coverage [email or web address] Password [email or web address]

## 2017-11-14 NOTE — Care Management (Signed)
EMS transport arranged for 1530. EMS reports it my be awhile. RN updated.

## 2017-11-14 NOTE — Progress Notes (Signed)
Sheila Pierce -[place] [phone] would like for CM and MD to talk with him in regards to her home health needs.

## 2017-11-14 NOTE — Progress Notes (Signed)
Patient sons says his oxygen concentrator is not working so he cannot take care of the patient at home today.  Informed Dr. Kerry Hough. EMS transport canceled.

## 2017-11-15 DIAGNOSIS — I10 Essential (primary) hypertension: Secondary | ICD-10-CM

## 2017-11-15 DIAGNOSIS — J449 Chronic obstructive pulmonary disease, unspecified: Secondary | ICD-10-CM | POA: Diagnosis not present

## 2017-11-15 DIAGNOSIS — I5032 Chronic diastolic (congestive) heart failure: Secondary | ICD-10-CM

## 2017-11-15 DIAGNOSIS — R5381 Other malaise: Secondary | ICD-10-CM | POA: Diagnosis not present

## 2017-11-15 DIAGNOSIS — Z7401 Bed confinement status: Secondary | ICD-10-CM | POA: Diagnosis not present

## 2017-11-15 DIAGNOSIS — J9611 Chronic respiratory failure with hypoxia: Secondary | ICD-10-CM

## 2017-11-15 DIAGNOSIS — J9610 Chronic respiratory failure, unspecified whether with hypoxia or hypercapnia: Secondary | ICD-10-CM | POA: Diagnosis not present

## 2017-11-15 NOTE — Progress Notes (Signed)
SATURATION QUALIFICATIONS: (This note is used to comply with regulatory documentation for home oxygen)  Patient Saturations on Room Air at Rest = 85%  Patient Saturations on Room Air while Ambulating = 79%  Patient Saturations on 3 Liters of oxygen while Ambulating = 95%  Pt desats while ambulating on room air.

## 2017-11-15 NOTE — Care Management (Signed)
For home use only DME oxygen (Order [id number])  General Supply  Date: 11/15/2017 Department: [facility] Ordering/Authorizing: Erick Blinks, MD   Erick Blinks, MD NPI: [id number]    Patient Information   Patient Name Sheila Pierce, Sheila Pierce Sex Female DOB November 24, 1945 SSN [id number]  Order Information   Order Date/Time Release Date/Time Start Date/Time End Date/Time  11/15/17 11:43 AM None 11/15/17 11:43 AM 11/15/17 11:43 AM  Order History  Inpatient  Date/Time Action Taken User Additional Information  11/15/17 1143 Sign Sher Shampine, Marcelle Overlie, RN Ordering Mode: Verbal with Read Back: Cosign Required  11/15/17 1143 Release Instance Damonie Ellenwood, Marcelle Overlie, RN (auto-released) Released Order: [id number]  11/15/17 1245 Verbal Cosign Erick Blinks, MD   Order Questions   Question Answer Comment  Mode or (Route) Nasal cannula   Liters per Minute 3   Frequency Continuous (stationary and portable oxygen unit needed)   Oxygen conserving device Yes   Oxygen delivery system Gas       Process Instructions   Verify pulse oximetry with O2 sat less than or equal to 88% on RA or Arterial Blood Gas (ABG) with pO2 or below.   If pulse oximetry is performed during ambulation or exertion, the following must be documented:  . O2 sat on RA at rest  . O2 sat on RA while ambulating/during exertion  . O2 sat on oxygen while ambulating/during exertion   Standing Order Information   Remaining Occurrences Interval Last Released     0/1 Once 11/15/2017         Collection Information    Encounter   View Encounter        Verbal Order Info   Action Created on Order Mode Entered by Comment Responsible Provider Signed by Signed on  Ordering 11/15/17 1143 Verbal with Read Back: Cosign Required Bates [place], Marcelle Overlie, RN  Erick Blinks, MD Erick Blinks, MD 11/15/17 1245  Tracking Reports    Cosign Tracking Order Transmittal Tracking

## 2017-11-15 NOTE — Progress Notes (Signed)
Patient seen and examined.  She feels that her breathing is currently at baseline.  No worsening shortness of breath overnight.  No wheezing.  Vitals reviewed and have been stable overnight.  She does not appear to be in any distress.  Lungs are diminished bilaterally.  Patient with advanced COPD and chronic respiratory failure, admitted with COPD exacerbation.  She was treated with steroids and appears to be approaching baseline.  Plan was to discharge her home yesterday, but this could not be done since her son reported that they did not have oxygen at home.  Arrangements have been made for her oxygen to be set up today.  She will discharge home later today.  Further plan/discharge medications unchanged from discharge summary done yesterday.  [employer]
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
## 2017-11-15 NOTE — Care Management (Addendum)
CM contacted [employer], they will be calling [name]-son- to clarify problem /fix concentrator.   CM discussed with patient that if oxygen issue can not be fixed she would need to be placed.   CM still waiting return call from [facility] Medicine to obtain appointment.   Patient expressed interest in [facility] if necessary.   ADDENDUM: Received call from [facility] Medicine, patient actually had an appointment scheduled for yesterday 11/14/2017. They will call CM back with an appt time for hospital follow up. - Appointment is November 21, 2017 at 10:45. Added to AVS.

## 2017-11-15 NOTE — Progress Notes (Signed)
EMS obtained pt at this time. Pt aware of transfer home. Son- scottie roberts notified at this time that EMS will be leaving [facility] at this time. No s/s of distress noted. Belongings given to EMS in one pt belonging bag along with discharge papers

## 2017-11-15 NOTE — Care Management (Signed)
For home use only DME oxygen (Order [id number])  General Supply  Date: 11/15/2017 Department: [facility] Ordering/Authorizing: Erick Blinks, MD   Erick Blinks, MD NPI: [id number]    Patient Information   Patient Name Sheila Pierce, Sheila Pierce Sex Female DOB 1945/07/25 SSN [id number]  Order Information   Order Date/Time Release Date/Time Start Date/Time End Date/Time  11/15/17 11:43 AM None 11/15/17 11:43 AM 11/15/17 11:43 AM  Order History  Inpatient  Date/Time Action Taken User Additional Information  11/15/17 1143 Sign Daniell Mancinas, Marcelle Overlie, RN Ordering Mode: Verbal with Read Back: Cosign Required  11/15/17 1143 Release Instance Jvion Turgeon, Marcelle Overlie, RN (auto-released) Released Order: [id number]  Order Questions   Question Answer Comment  Mode or (Route) Nasal cannula   Liters per Minute 3   Frequency Continuous (stationary and portable oxygen unit needed)   Oxygen conserving device Yes   Oxygen delivery system Gas       Process Instructions   Verify pulse oximetry with O2 sat less than or equal to 88% on RA or Arterial Blood Gas (ABG) with pO2 or below.   If pulse oximetry is performed during ambulation or exertion, the following must be documented:  . O2 sat on RA at rest  . O2 sat on RA while ambulating/during exertion  . O2 sat on oxygen while ambulating/during exertion   Standing Order Information   Remaining Occurrences Interval Last Released     0/1 Once 11/15/2017         Collection Information    Encounter   View Encounter        Verbal Order Info   Action Created on Order Mode Entered by Comment Responsible Provider Signed by Signed on  Ordering 11/15/17 1143 Verbal with Read Back: Cosign Required Jashaun Penrose, Marcelle Overlie, RN  Erick Blinks, MD    Tracking Reports    Cosign Tracking Order Transmittal Tracking

## 2017-11-16 ENCOUNTER — Other Ambulatory Visit: Payer: Self-pay

## 2017-11-16 NOTE — Patient Outreach (Addendum)
[facility]) Care Management  11/16/2017  Sheila Pierce 03/12/1945 [id number]   Telephone Screen  Referral Date: 11/16/17 Referral Source: [employer]-Urgent  Referral Reason: " multiple co-morbidities" Insurance: [facility]   Outreach attempt # 1 to patient. No numbers on filed listed for patient. RN CM attempted previous number found in chart attempted by staff at [phone]. No answer at present. RN CM left HIPAA compliant voicemail message along with contact info.      Plan: RN CM will make outreach attempt to patient within 3-4 business days. RN CM will send unsuccessful outreach letter to patient.    Antionette Fairy, RN,BSN,CCM [facility] Care Management Telephonic Care Management Coordinator Direct Phone: [phone] Toll Free: [phone] Fax: [phone]

## 2017-11-17 ENCOUNTER — Other Ambulatory Visit: Payer: Self-pay | Admitting: *Deleted

## 2017-11-17 DIAGNOSIS — J441 Chronic obstructive pulmonary disease with (acute) exacerbation: Secondary | ICD-10-CM

## 2017-11-17 NOTE — Patient Outreach (Signed)
Referral received from

## 2017-11-18 ENCOUNTER — Other Ambulatory Visit: Payer: Self-pay | Admitting: Pharmacist

## 2017-11-18 DIAGNOSIS — R69 Illness, unspecified: Secondary | ICD-10-CM | POA: Diagnosis not present
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
## 2017-11-18 NOTE — Patient Outreach (Addendum)
[facility]) Care Management [place]  11/18/2017  Sheila Pierce 1945/12/17 [id number]   Reason for referral: Medication Management (patient did not have meds s/p discharge)  Referral source: [employer]na Current insurance: [employer] - though son states that patient has been approved for full Medicaid, has not received card in the mail  PMHx: CAD hx NSTEMI, dCHF, HTN, COPD, GERD, DM  HPI: Patient referred to [place] for medication management. Hospitalized at [facility] for COPD exacerbation 10/18-10/22/2019, patient does not currently have a PCP but will be establishing with Hendricks Limes on Monday, 11/21/2017. Patient's son, Sheila Pierce, Sheila Pierce, concerned that patient did not have some of her medication s/p discharge, including ipratropium-albuterol, furosemide, prednisone, buspirone, and simvastatin. He notes that she was just approved for [facility] Medicaid, but her card has not been received in the mail yet. He confirms that they fill prescriptions at [place].  Upon medication review, he notes his confusion about the patient being prescribed both Symbicort and Breo, as he is aware this is a duplication of therapy. However, he has been giving the patient both medications. He says that she has a hard time taking Breo, as she wants to blow into the dry powder inhaler instead of breathe in.   He noted that she was recently discharged from [facility]ospice care and that they "messed up her medications". He also indicates that she is not taking any OTC medications that are not covered by insurance because his brother refuses to pay for them.   Notes that she had been receiving morphine and alprazolam from Hospice, and that she needs more alprazolam. She has been out of buspirone and has been much more anxious.  Objective: Lab Results  Component Value Date   CREATININE 0.86 11/12/2017   CREATININE 0.75 11/11/2017   CREATININE 0.79 07/30/2017  CrCl ~ 47 mL/min, eGFR >60  mL/min/1.49m  Lab Results  Component Value Date   HGBA1C 5.5 07/26/2017    Lipid Panel     Component Value Date/Time   CHOL 200 (H) 04/01/2016 0943   TRIG 135 04/01/2016 0943   HDL 55 04/01/2016 0943   CHOLHDL 2.9 10/05/2011 0143   VLDL 15 10/05/2011 0143   LDLCALC 118 (H) 04/01/2016 0943    BP Readings from Last 3 Encounters:  11/15/17 133/78  07/30/17 (!) 167/85  02/15/17 132/82    Allergies  Allergen Reactions  . Penicillins Swelling and Other (See Comments)    Reaction:  Unspecified swelling reaction Has patient had a PCN reaction causing immediate rash, facial/tongue/throat swelling, SOB or lightheadedness with hypotension: Yes Has patient had a PCN reaction causing severe rash involving mucus membranes or skin necrosis: No Has patient had a PCN reaction that required hospitalization No Has patient had a PCN reaction occurring within the last 10 years: Yes If all of the above answers are "NO", then may proceed with Cephalosporin use.  . Sulfa Antibiotics Hives and Other (See Comments)    Reaction:  Hallucinations   . Codeine Nausea Only  . Hydrocodone Nausea Only  . Levaquin [Levofloxacin] Itching, Swelling and Rash    Medications Reviewed Today    Reviewed by [facility](Pharmacist) on 11/18/17 at 1211  Med List Status: <None>  Medication Order Taking? Sig Documenting Provider Last Dose Status Informant  albuterol (VENTOLIN HFA) 108 (90 Base) MCG/ACT inhaler [id number] INHALE 2 PUFFS EVERY FOUR HOURS AS NEEDED FOR WHEEZING OR SHORTNESS OF BGolden [address] MD Taking Active   aspirin  EC 81 MG tablet [id number] Yes Take 1 tablet (81 mg total) by mouth daily. Kathie Dike, MD Taking Active   BREO ELLIPTA 100-25 MCG/INH AEPB [id number] Yes Inhale 1 puff into the lungs daily. Kathie Dike, MD Taking Active   budesonide-formoterol [facility]) 160-4.5 MCG/ACT inhaler [id number] Yes Inhale 2 puffs into the lungs 2 (two) times daily. Kathie Dike, MD Taking Active   busPIRone (BUSPAR) 15 MG tablet [id number] No Take 1 tablet (15 mg total) by mouth 2 (two) times daily.  Patient not taking:  Reported on 11/17/2017   Kathie Dike, MD Not Taking Active   cholecalciferol (VITAMIN D) 1000 units tablet [id number] No Take 1,000 Units by mouth daily. [provider] Not Taking Active Nursing Home Medication Administration Guide (MAG)  dextromethorphan-guaiFENesin (MUCINEX DM) 30-600 MG 12hr tablet [id number] Yes Take 1 tablet by mouth 2 (two) times daily as needed for cough. Kathie Dike, MD Taking Active   furosemide (LASIX) 20 MG tablet [id number] Yes Take 2 tablets (40 mg total) by mouth daily. Kathie Dike, MD Taking Active   ipratropium-albuterol (DUONEB) 0.5-2.5 (3) MG/3ML SOLN [id number] No Take 3 mLs by nebulization every 4 (four) hours.  Patient not taking:  Reported on 11/17/2017   Kathie Dike, MD Not Taking Active   Melatonin-Pyridoxine (MELATIN) 3-1 MG TABS [id number] No Take 6 mg by mouth at bedtime. [provider] Not Taking Active Nursing Home Medication Administration Guide (MAG)  metoprolol tartrate (LOPRESSOR) 25 MG tablet [id number] Yes Take 1 tablet (25 mg total) by mouth 2 (two) times daily. Kathie Dike, MD Taking Active   Multiple Vitamin (MULTIVITAMIN WITH MINERALS) TABS tablet [id number] No Take 1 tablet by mouth daily. [provider] Not Taking Active Nursing Home Medication Administration Guide (MAG)  potassium chloride (K-DUR) 10 MEQ tablet [id number] Yes Take 1 tablet (10 mEq total) by mouth daily. Kathie Dike, MD Taking Active   predniSONE (DELTASONE) 10 MG tablet [id number] No Take 63m po daily for 2 days then 326mdaily for 2 days then 204maily for 2 days then 71m7mily for 2 days then stop  Patient not taking:  Reported on 11/17/2017   MemoKathie Dike Not Taking Active   simvastatin (ZOCOR) 10 MG tablet [id number] 1 tablet (10 mg total) by mouth daily.   Patient not taking:  Reported on 11/18/2017   MemoKathie Dike Not Taking Active   traMADol (ULTRAM) 50 MG tablet [id number] 1 tablet (50 mg total) by mouth every 6 (six) hours as needed for moderate pain or severe pain.  Patient not taking:  Reported on 11/17/2017   NelsRaylene Everts Not Taking Active Nursing Home Medication Administration Guide (MAG)Randolphed List Note (AshLisette Abu08/18 1725): [phone]         ASSESSMENT: [facility] Date Medication Reconciliation Performed: 11/18/2017  No new medications were prescribed at discharge.  Patient was recently discharged from hospital and all medications have been reviewed   Drugs sorted by system:  Neurologic/Psychologic: buspirone  Cardiovascular: aspirin, furosemide, metoprolol, potassium, simvastatin  Pulmonary/Allergy: Symbicort, Breo, albuterol HFA, ipratropium-albuterol nebulized, dextromethorphan-guaifenesin  Pain: tramadol (did not receive)  Vitamins/Minerals: vitamin D (not taking d/t cost), melatonin (not taking d/t cost), multivitamin (not taking d/t cost)  Medication Review Findings:  . Duplicative Therapy: Breo and Symbicort are both ICS/LABA agents. Patient likely should not be on both. Symbicort is preferred on [place] Medicaid - suggest continuing Symbicort and discontinuing Breo, as patient's  son states that she has a hard time using Breo. Duplicative ICS therapy increases patient's risk of thrush, as well as systemic corticosteroid side effects  . COPD: Patient has a diagnosis of COPD, is on ICS/LABA therapy and does not appear to be controlled. Per chart review, cannot tell why patient is not on LAMA therapy. Additionally, appropriate to assess whether patient has inspiratory capacity to derive benefit from inhalers, versus only using nebulized bronchodilator therapy from here on out.   Plan: - Contacted [facility]; they were in the process of filling  buspirone, prednisone, and simvastatin; asked pharmacist to add ipratropium-albuterol and furosemide to the fill; they plan to deliver these medications to the patient today - Contacted [name], left HIPAA compliant voicemail. - Will route note to new PCP, Hendricks Limes, for recommendations regarding duplication of inhaler therapy - Will close case d/t goals of referral being met. Will route note to interdisciplinary team members.  Catie Darnelle Maffucci, PharmD PGY2 Ambulatory Care Pharmacy Resident, [place] [facility] Phone: [phone]

## 2017-11-21 DIAGNOSIS — R159 Full incontinence of feces: Secondary | ICD-10-CM | POA: Diagnosis not present

## 2017-11-21 DIAGNOSIS — Z7689 Persons encountering health services in other specified circumstances: Secondary | ICD-10-CM | POA: Diagnosis not present

## 2017-11-21 DIAGNOSIS — N3942 Incontinence without sensory awareness: Secondary | ICD-10-CM | POA: Diagnosis not present

## 2017-11-21 DIAGNOSIS — M6281 Muscle weakness (generalized): Secondary | ICD-10-CM | POA: Diagnosis not present

## 2017-11-21 DIAGNOSIS — J9611 Chronic respiratory failure with hypoxia: Secondary | ICD-10-CM | POA: Diagnosis not present

## 2017-11-21 DIAGNOSIS — J449 Chronic obstructive pulmonary disease, unspecified: Secondary | ICD-10-CM | POA: Diagnosis not present

## 2017-11-21 DIAGNOSIS — R69 Illness, unspecified: Secondary | ICD-10-CM | POA: Diagnosis not present

## 2017-11-21 DIAGNOSIS — Z9981 Dependence on supplemental oxygen: Secondary | ICD-10-CM | POA: Diagnosis not present

## 2017-11-21 DIAGNOSIS — Z23 Encounter for immunization: Secondary | ICD-10-CM | POA: Diagnosis not present

## 2017-11-23 ENCOUNTER — Other Ambulatory Visit: Payer: Self-pay | Admitting: *Deleted

## 2017-11-23 ENCOUNTER — Encounter: Payer: Self-pay | Admitting: *Deleted

## 2017-11-23 DIAGNOSIS — R0602 Shortness of breath: Secondary | ICD-10-CM | POA: Diagnosis not present

## 2017-11-23 DIAGNOSIS — I10 Essential (primary) hypertension: Secondary | ICD-10-CM | POA: Diagnosis not present

## 2017-11-23 DIAGNOSIS — I959 Hypotension, unspecified: Secondary | ICD-10-CM | POA: Diagnosis not present

## 2017-11-23 DIAGNOSIS — J449 Chronic obstructive pulmonary disease, unspecified: Secondary | ICD-10-CM | POA: Diagnosis not present

## 2017-11-23 DIAGNOSIS — Z87891 Personal history of nicotine dependence: Secondary | ICD-10-CM | POA: Diagnosis not present

## 2017-11-23 DIAGNOSIS — Z7401 Bed confinement status: Secondary | ICD-10-CM | POA: Diagnosis not present

## 2017-11-23 DIAGNOSIS — Z880 Allergy status to penicillin: Secondary | ICD-10-CM | POA: Diagnosis not present

## 2017-11-23 DIAGNOSIS — R29898 Other symptoms and signs involving the musculoskeletal system: Secondary | ICD-10-CM | POA: Diagnosis not present

## 2017-11-23 DIAGNOSIS — Z79899 Other long term (current) drug therapy: Secondary | ICD-10-CM | POA: Diagnosis not present

## 2017-11-23 DIAGNOSIS — Z882 Allergy status to sulfonamides status: Secondary | ICD-10-CM | POA: Diagnosis not present

## 2017-11-23 DIAGNOSIS — R58 Hemorrhage, not elsewhere classified: Secondary | ICD-10-CM | POA: Diagnosis not present

## 2017-11-23 DIAGNOSIS — J9622 Acute and chronic respiratory failure with hypercapnia: Secondary | ICD-10-CM | POA: Diagnosis not present

## 2017-11-23 NOTE — Patient Outreach (Signed)
Telephone call to patient's son [name] for transition of care week 2, spoke with pt and son,  [name] states he is very busy but can talk for a short while, reports pt medicaid approved and [employer] has been contacted for PCS services,  in process of obtaining hoyer lift and wheelchair ramp, [name] says he is doing a lot of lifting to assist his mother out of bed.  Pt saw new primary care MD.  THN CM Care Plan Problem One     Most Recent Value  Care Plan Problem One  Knowledge deficit related to COPD  Role Documenting the Problem One  Care Management Coordinator  Care Plan for Problem One  Active  THN Long Term Goal   Pt, son will report/ demonstrate improved self care related to COPD within 31 days  THN Long Term Goal Start Date  11/18/17  Interventions for Problem One Long Term Goal  RN CM reviewed with son that pt did see new primary care provider on 11/21/17, son has prescriptions to fill, states buspar and Brio ellipta discontinued, pt continues on prednisone  THN CM Short Term Goal #1   patient's caregiver will verbalize COPD action plan/ zones within 30 days.  THN CM Short Term Goal #1 Start Date  11/18/17  Interventions for Short Term Goal #1  RN CM reinforced COPD action plan  THN CM Short Term Goal #2   pt will attend primary MD appointment next week  THN CM Short Term Goal #2 Start Date  11/18/17  THN CM Short Term Goal #2 Met Date  11/23/17  Interventions for Short Term Goal #2  Reviewed changes made from primary care MD appointment.     PLAN Continue weekly transition of care calls  Julie Farmer RNC, BSN [facility] Community Care Coordinator [phone]   

## 2017-11-30 ENCOUNTER — Other Ambulatory Visit: Payer: Self-pay | Admitting: *Deleted

## 2017-11-30 NOTE — Patient Outreach (Signed)
Telephone call to patient's son [name] for transition of care week 3, [name] states " I'm in a hurry, I"m leaving right now, call me tomorrow"  PLAN Call patient's son tomorrow for transition of care   Irving Shows [facility], BSN [facility] Care Coordinator [phone]

## 2017-12-01 ENCOUNTER — Other Ambulatory Visit: Payer: Self-pay | Admitting: *Deleted

## 2017-12-01 NOTE — Patient Outreach (Signed)
Telephone call to patient for transition of care, spoke with patient's son Sheila Pierce, HIPAA verified, Sheila Pierce reports Sheila Pierce coming out on 11/15 to see pt and complete assessment for home services and pt could potentially be eligible for 80 hours per month.  Pt saw primary care provider this past Wednesday and to follow up with primary care provider in December. Hoyer lift has been ordered, Sheila Pierce reports pt taking medications as prescribed.    THN CM Care Plan Problem One     Most Recent Value  Care Plan Problem One  Knowledge deficit related to COPD  Role Documenting the Problem One  Care Management Coordinator  Care Plan for Problem One  Active  THN Long Term Goal   Pt, son will report/ demonstrate improved self care related to COPD within 32 days  THN Long Term Goal Start Date  11/18/17  Interventions for Problem One Long Term Goal  Sheila Pierce states pt is in process of getting Bipap machine, reports pt has all medications for COPD and taking as prescribed  THN CM Short Term Goal #1   patient's caregiver will verbalize COPD action plan/ zones within 30 days.  THN CM Short Term Goal #1 Start Date  11/18/17  Interventions for Short Term Goal #1  -- [RN CM reviewed COPD action plan]  THN CM Short Term Goal #2   pt will attend primary MD appointment next week  [facility] CM Short Term Goal #2 Start Date  11/18/17  [facility] CM Short Term Goal #2 Met Date  11/23/17      PLAN Continue weekly transition of care  Sheila Pierce [facility], [place] Coordinator [phone]

## 2017-12-04 DIAGNOSIS — Z743 Need for continuous supervision: Secondary | ICD-10-CM | POA: Diagnosis not present

## 2017-12-04 DIAGNOSIS — Z880 Allergy status to penicillin: Secondary | ICD-10-CM | POA: Diagnosis not present

## 2017-12-04 DIAGNOSIS — Z87891 Personal history of nicotine dependence: Secondary | ICD-10-CM | POA: Diagnosis not present

## 2017-12-04 DIAGNOSIS — J449 Chronic obstructive pulmonary disease, unspecified: Secondary | ICD-10-CM | POA: Diagnosis not present

## 2017-12-04 DIAGNOSIS — R000 Tachycardia, unspecified: Secondary | ICD-10-CM | POA: Diagnosis not present

## 2017-12-04 DIAGNOSIS — Z836 Family history of other diseases of the respiratory system: Secondary | ICD-10-CM | POA: Diagnosis not present

## 2017-12-04 DIAGNOSIS — R531 Weakness: Secondary | ICD-10-CM | POA: Diagnosis not present

## 2017-12-04 DIAGNOSIS — R069 Unspecified abnormalities of breathing: Secondary | ICD-10-CM | POA: Diagnosis not present

## 2017-12-04 DIAGNOSIS — I10 Essential (primary) hypertension: Secondary | ICD-10-CM | POA: Diagnosis not present

## 2017-12-04 DIAGNOSIS — R0902 Hypoxemia: Secondary | ICD-10-CM | POA: Diagnosis not present

## 2017-12-04 DIAGNOSIS — R0602 Shortness of breath: Secondary | ICD-10-CM | POA: Diagnosis not present

## 2017-12-04 DIAGNOSIS — R69 Illness, unspecified: Secondary | ICD-10-CM | POA: Diagnosis not present

## 2017-12-04 DIAGNOSIS — Z882 Allergy status to sulfonamides status: Secondary | ICD-10-CM | POA: Diagnosis not present

## 2017-12-04 DIAGNOSIS — R062 Wheezing: Secondary | ICD-10-CM | POA: Diagnosis not present

## 2017-12-04 DIAGNOSIS — Z888 Allergy status to other drugs, medicaments and biological substances status: Secondary | ICD-10-CM | POA: Diagnosis not present

## 2017-12-04 DIAGNOSIS — R0689 Other abnormalities of breathing: Secondary | ICD-10-CM | POA: Diagnosis not present

## 2017-12-06 DIAGNOSIS — T148XXA Other injury of unspecified body region, initial encounter: Secondary | ICD-10-CM | POA: Diagnosis not present

## 2017-12-06 DIAGNOSIS — S41111A Laceration without foreign body of right upper arm, initial encounter: Secondary | ICD-10-CM | POA: Diagnosis not present

## 2017-12-06 DIAGNOSIS — R0689 Other abnormalities of breathing: Secondary | ICD-10-CM | POA: Diagnosis not present

## 2017-12-08 ENCOUNTER — Other Ambulatory Visit: Payer: Self-pay | Admitting: *Deleted

## 2017-12-08 NOTE — Patient Outreach (Signed)
Telephone call to pt for transition of care week 4, spoke with son [name], HIPAA verified, [name] reports order for hoyer lift sent to [place] on hold due to roach infestation which exterminator is working on, per [employer], [employer] will not bring their equipment into a home with roach infestation.  Pt also has oxygen through [facility].  [name] states there are reasons why they will not use other DME companies such as [place], [place].    THN CM Care Plan Problem One     Most Recent Value  Care Plan Problem One  Knowledge deficit related to COPD  Role Documenting the Problem One  Care Management Coordinator  Care Plan for Problem One  Active  THN Long Term Goal   Pt, son will report/ demonstrate improved self care related to COPD within 93 days  THN Long Term Goal Start Date  11/18/17  Interventions for Problem One Long Term Goal  RN CM reviewed with son that [place] is still to see pt tomorrow to assist with bathing, ADL's , continues to wait on Bipap machine due to roach infestation and exterminator is ongoing   THN CM Short Term Goal #1   patient's caregiver will verbalize COPD action plan/ zones within 30 days.  THN CM Short Term Goal #1 Start Date  11/18/17  Interventions for Short Term Goal #1  RN CM reinforced COPD action plan  THN CM Short Term Goal #2   pt will attend primary MD appointment next week  [facility] CM Short Term Goal #2 Start Date  11/18/17  [facility] CM Short Term Goal #2 Met Date  11/23/17      PLAN Outreach pt for telephone assessment next month  Jacqlyn Larsen [facility] [place] Coordinator [phone]

## 2017-12-14 DIAGNOSIS — R69 Illness, unspecified: Secondary | ICD-10-CM | POA: Diagnosis not present

## 2017-12-16 DIAGNOSIS — J9610 Chronic respiratory failure, unspecified whether with hypoxia or hypercapnia: Secondary | ICD-10-CM | POA: Diagnosis not present

## 2017-12-16 DIAGNOSIS — J449 Chronic obstructive pulmonary disease, unspecified: Secondary | ICD-10-CM | POA: Diagnosis not present

## 2017-12-29 DIAGNOSIS — Z66 Do not resuscitate: Secondary | ICD-10-CM | POA: Diagnosis not present

## 2017-12-29 DIAGNOSIS — G4733 Obstructive sleep apnea (adult) (pediatric): Secondary | ICD-10-CM | POA: Diagnosis not present

## 2017-12-29 DIAGNOSIS — R0602 Shortness of breath: Secondary | ICD-10-CM | POA: Diagnosis not present

## 2017-12-29 DIAGNOSIS — Z9981 Dependence on supplemental oxygen: Secondary | ICD-10-CM | POA: Diagnosis not present

## 2017-12-29 DIAGNOSIS — J9622 Acute and chronic respiratory failure with hypercapnia: Secondary | ICD-10-CM | POA: Diagnosis not present

## 2017-12-29 DIAGNOSIS — R000 Tachycardia, unspecified: Secondary | ICD-10-CM | POA: Diagnosis not present

## 2017-12-29 DIAGNOSIS — R0902 Hypoxemia: Secondary | ICD-10-CM | POA: Diagnosis not present

## 2017-12-29 DIAGNOSIS — I110 Hypertensive heart disease with heart failure: Secondary | ICD-10-CM | POA: Diagnosis not present

## 2017-12-29 DIAGNOSIS — J9621 Acute and chronic respiratory failure with hypoxia: Secondary | ICD-10-CM | POA: Diagnosis not present

## 2017-12-29 DIAGNOSIS — Z9114 Patient's other noncompliance with medication regimen: Secondary | ICD-10-CM | POA: Diagnosis not present

## 2017-12-29 DIAGNOSIS — Z7401 Bed confinement status: Secondary | ICD-10-CM | POA: Diagnosis not present

## 2017-12-29 DIAGNOSIS — J441 Chronic obstructive pulmonary disease with (acute) exacerbation: Secondary | ICD-10-CM | POA: Diagnosis not present

## 2017-12-29 DIAGNOSIS — I5032 Chronic diastolic (congestive) heart failure: Secondary | ICD-10-CM | POA: Diagnosis not present

## 2017-12-29 DIAGNOSIS — R062 Wheezing: Secondary | ICD-10-CM | POA: Diagnosis not present

## 2017-12-29 DIAGNOSIS — I10 Essential (primary) hypertension: Secondary | ICD-10-CM | POA: Diagnosis not present

## 2017-12-29 DIAGNOSIS — J439 Emphysema, unspecified: Secondary | ICD-10-CM | POA: Diagnosis not present

## 2017-12-29 DIAGNOSIS — R0689 Other abnormalities of breathing: Secondary | ICD-10-CM | POA: Diagnosis not present

## 2017-12-29 DIAGNOSIS — Z87891 Personal history of nicotine dependence: Secondary | ICD-10-CM | POA: Diagnosis not present

## 2017-12-30 DIAGNOSIS — I110 Hypertensive heart disease with heart failure: Secondary | ICD-10-CM | POA: Diagnosis not present

## 2017-12-30 DIAGNOSIS — J9621 Acute and chronic respiratory failure with hypoxia: Secondary | ICD-10-CM | POA: Diagnosis not present

## 2017-12-30 DIAGNOSIS — Z7401 Bed confinement status: Secondary | ICD-10-CM | POA: Diagnosis not present

## 2017-12-30 DIAGNOSIS — R000 Tachycardia, unspecified: Secondary | ICD-10-CM | POA: Diagnosis not present

## 2017-12-30 DIAGNOSIS — J9622 Acute and chronic respiratory failure with hypercapnia: Secondary | ICD-10-CM | POA: Diagnosis not present

## 2018-01-03 DIAGNOSIS — J449 Chronic obstructive pulmonary disease, unspecified: Secondary | ICD-10-CM | POA: Diagnosis not present

## 2018-01-03 DIAGNOSIS — R7303 Prediabetes: Secondary | ICD-10-CM | POA: Diagnosis not present

## 2018-01-03 DIAGNOSIS — Z1211 Encounter for screening for malignant neoplasm of colon: Secondary | ICD-10-CM | POA: Diagnosis not present

## 2018-01-03 DIAGNOSIS — K5900 Constipation, unspecified: Secondary | ICD-10-CM | POA: Diagnosis not present

## 2018-01-03 DIAGNOSIS — Z1239 Encounter for other screening for malignant neoplasm of breast: Secondary | ICD-10-CM | POA: Diagnosis not present

## 2018-01-03 DIAGNOSIS — R4189 Other symptoms and signs involving cognitive functions and awareness: Secondary | ICD-10-CM | POA: Diagnosis not present

## 2018-01-03 DIAGNOSIS — R69 Illness, unspecified: Secondary | ICD-10-CM | POA: Diagnosis not present

## 2018-01-03 DIAGNOSIS — I2510 Atherosclerotic heart disease of native coronary artery without angina pectoris: Secondary | ICD-10-CM | POA: Diagnosis not present

## 2018-01-03 DIAGNOSIS — E785 Hyperlipidemia, unspecified: Secondary | ICD-10-CM | POA: Diagnosis not present

## 2018-01-05 DIAGNOSIS — J438 Other emphysema: Secondary | ICD-10-CM | POA: Diagnosis not present

## 2018-01-05 DIAGNOSIS — J441 Chronic obstructive pulmonary disease with (acute) exacerbation: Secondary | ICD-10-CM | POA: Diagnosis not present

## 2018-01-05 DIAGNOSIS — J440 Chronic obstructive pulmonary disease with acute lower respiratory infection: Secondary | ICD-10-CM | POA: Diagnosis not present

## 2018-01-05 DIAGNOSIS — R32 Unspecified urinary incontinence: Secondary | ICD-10-CM | POA: Diagnosis not present

## 2018-01-05 DIAGNOSIS — E440 Moderate protein-calorie malnutrition: Secondary | ICD-10-CM | POA: Diagnosis not present

## 2018-01-05 DIAGNOSIS — J449 Chronic obstructive pulmonary disease, unspecified: Secondary | ICD-10-CM | POA: Diagnosis not present

## 2018-01-05 DIAGNOSIS — M818 Other osteoporosis without current pathological fracture: Secondary | ICD-10-CM | POA: Diagnosis not present

## 2018-01-05 DIAGNOSIS — L89152 Pressure ulcer of sacral region, stage 2: Secondary | ICD-10-CM | POA: Diagnosis not present

## 2018-01-05 DIAGNOSIS — J9612 Chronic respiratory failure with hypercapnia: Secondary | ICD-10-CM | POA: Diagnosis not present

## 2018-01-06 DIAGNOSIS — E1165 Type 2 diabetes mellitus with hyperglycemia: Secondary | ICD-10-CM | POA: Diagnosis not present

## 2018-01-06 DIAGNOSIS — T380X5A Adverse effect of glucocorticoids and synthetic analogues, initial encounter: Secondary | ICD-10-CM | POA: Diagnosis not present

## 2018-01-06 DIAGNOSIS — J9690 Respiratory failure, unspecified, unspecified whether with hypoxia or hypercapnia: Secondary | ICD-10-CM | POA: Diagnosis not present

## 2018-01-06 DIAGNOSIS — I4891 Unspecified atrial fibrillation: Secondary | ICD-10-CM | POA: Diagnosis not present

## 2018-01-06 DIAGNOSIS — Z7951 Long term (current) use of inhaled steroids: Secondary | ICD-10-CM | POA: Diagnosis not present

## 2018-01-06 DIAGNOSIS — R0603 Acute respiratory distress: Secondary | ICD-10-CM | POA: Diagnosis not present

## 2018-01-06 DIAGNOSIS — Z7982 Long term (current) use of aspirin: Secondary | ICD-10-CM | POA: Diagnosis not present

## 2018-01-06 DIAGNOSIS — R7989 Other specified abnormal findings of blood chemistry: Secondary | ICD-10-CM | POA: Diagnosis not present

## 2018-01-06 DIAGNOSIS — R4020 Unspecified coma: Secondary | ICD-10-CM | POA: Diagnosis not present

## 2018-01-06 DIAGNOSIS — R404 Transient alteration of awareness: Secondary | ICD-10-CM | POA: Diagnosis not present

## 2018-01-06 DIAGNOSIS — R410 Disorientation, unspecified: Secondary | ICD-10-CM | POA: Diagnosis not present

## 2018-01-06 DIAGNOSIS — Z79899 Other long term (current) drug therapy: Secondary | ICD-10-CM | POA: Diagnosis not present

## 2018-01-06 DIAGNOSIS — J441 Chronic obstructive pulmonary disease with (acute) exacerbation: Secondary | ICD-10-CM | POA: Diagnosis not present

## 2018-01-06 DIAGNOSIS — R739 Hyperglycemia, unspecified: Secondary | ICD-10-CM | POA: Diagnosis not present

## 2018-01-06 DIAGNOSIS — Z87891 Personal history of nicotine dependence: Secondary | ICD-10-CM | POA: Diagnosis not present

## 2018-01-06 DIAGNOSIS — Z66 Do not resuscitate: Secondary | ICD-10-CM | POA: Diagnosis not present

## 2018-01-06 DIAGNOSIS — R0902 Hypoxemia: Secondary | ICD-10-CM | POA: Diagnosis not present

## 2018-01-07 DIAGNOSIS — J9691 Respiratory failure, unspecified with hypoxia: Secondary | ICD-10-CM | POA: Diagnosis not present

## 2018-01-07 DIAGNOSIS — R0600 Dyspnea, unspecified: Secondary | ICD-10-CM | POA: Diagnosis not present

## 2018-01-07 DIAGNOSIS — I5033 Acute on chronic diastolic (congestive) heart failure: Secondary | ICD-10-CM | POA: Diagnosis not present

## 2018-01-07 DIAGNOSIS — Z7982 Long term (current) use of aspirin: Secondary | ICD-10-CM | POA: Diagnosis not present

## 2018-01-07 DIAGNOSIS — A0811 Acute gastroenteropathy due to Norwalk agent: Secondary | ICD-10-CM | POA: Diagnosis not present

## 2018-01-07 DIAGNOSIS — Z9981 Dependence on supplemental oxygen: Secondary | ICD-10-CM | POA: Diagnosis not present

## 2018-01-07 DIAGNOSIS — R739 Hyperglycemia, unspecified: Secondary | ICD-10-CM | POA: Diagnosis not present

## 2018-01-07 DIAGNOSIS — R740 Nonspecific elevation of levels of transaminase and lactic acid dehydrogenase [LDH]: Secondary | ICD-10-CM | POA: Diagnosis not present

## 2018-01-07 DIAGNOSIS — Z7952 Long term (current) use of systemic steroids: Secondary | ICD-10-CM | POA: Diagnosis not present

## 2018-01-07 DIAGNOSIS — Z9989 Dependence on other enabling machines and devices: Secondary | ICD-10-CM | POA: Diagnosis not present

## 2018-01-07 DIAGNOSIS — Z79899 Other long term (current) drug therapy: Secondary | ICD-10-CM | POA: Diagnosis not present

## 2018-01-07 DIAGNOSIS — I509 Heart failure, unspecified: Secondary | ICD-10-CM | POA: Diagnosis not present

## 2018-01-07 DIAGNOSIS — B9781 Human metapneumovirus as the cause of diseases classified elsewhere: Secondary | ICD-10-CM | POA: Diagnosis not present

## 2018-01-07 DIAGNOSIS — Z87891 Personal history of nicotine dependence: Secondary | ICD-10-CM | POA: Diagnosis not present

## 2018-01-07 DIAGNOSIS — R7989 Other specified abnormal findings of blood chemistry: Secondary | ICD-10-CM | POA: Diagnosis not present

## 2018-01-07 DIAGNOSIS — K5900 Constipation, unspecified: Secondary | ICD-10-CM | POA: Diagnosis not present

## 2018-01-07 DIAGNOSIS — R7881 Bacteremia: Secondary | ICD-10-CM | POA: Diagnosis not present

## 2018-01-07 DIAGNOSIS — J9692 Respiratory failure, unspecified with hypercapnia: Secondary | ICD-10-CM | POA: Diagnosis not present

## 2018-01-07 DIAGNOSIS — M1990 Unspecified osteoarthritis, unspecified site: Secondary | ICD-10-CM | POA: Diagnosis not present

## 2018-01-07 DIAGNOSIS — D638 Anemia in other chronic diseases classified elsewhere: Secondary | ICD-10-CM | POA: Diagnosis not present

## 2018-01-07 DIAGNOSIS — I252 Old myocardial infarction: Secondary | ICD-10-CM | POA: Diagnosis not present

## 2018-01-07 DIAGNOSIS — R918 Other nonspecific abnormal finding of lung field: Secondary | ICD-10-CM | POA: Diagnosis not present

## 2018-01-07 DIAGNOSIS — J9621 Acute and chronic respiratory failure with hypoxia: Secondary | ICD-10-CM | POA: Diagnosis not present

## 2018-01-07 DIAGNOSIS — L98419 Non-pressure chronic ulcer of buttock with unspecified severity: Secondary | ICD-10-CM | POA: Diagnosis not present

## 2018-01-07 DIAGNOSIS — J9610 Chronic respiratory failure, unspecified whether with hypoxia or hypercapnia: Secondary | ICD-10-CM | POA: Diagnosis not present

## 2018-01-07 DIAGNOSIS — I110 Hypertensive heart disease with heart failure: Secondary | ICD-10-CM | POA: Diagnosis not present

## 2018-01-07 DIAGNOSIS — R000 Tachycardia, unspecified: Secondary | ICD-10-CM | POA: Diagnosis not present

## 2018-01-07 DIAGNOSIS — I5031 Acute diastolic (congestive) heart failure: Secondary | ICD-10-CM | POA: Diagnosis not present

## 2018-01-07 DIAGNOSIS — J211 Acute bronchiolitis due to human metapneumovirus: Secondary | ICD-10-CM | POA: Diagnosis not present

## 2018-01-07 DIAGNOSIS — Z7951 Long term (current) use of inhaled steroids: Secondary | ICD-10-CM | POA: Diagnosis not present

## 2018-01-07 DIAGNOSIS — Z7189 Other specified counseling: Secondary | ICD-10-CM | POA: Diagnosis not present

## 2018-01-07 DIAGNOSIS — R0603 Acute respiratory distress: Secondary | ICD-10-CM | POA: Diagnosis not present

## 2018-01-07 DIAGNOSIS — B9562 Methicillin resistant Staphylococcus aureus infection as the cause of diseases classified elsewhere: Secondary | ICD-10-CM | POA: Diagnosis not present

## 2018-01-07 DIAGNOSIS — R0689 Other abnormalities of breathing: Secondary | ICD-10-CM | POA: Diagnosis not present

## 2018-01-07 DIAGNOSIS — Z66 Do not resuscitate: Secondary | ICD-10-CM | POA: Diagnosis not present

## 2018-01-07 DIAGNOSIS — J449 Chronic obstructive pulmonary disease, unspecified: Secondary | ICD-10-CM | POA: Diagnosis not present

## 2018-01-07 DIAGNOSIS — N390 Urinary tract infection, site not specified: Secondary | ICD-10-CM | POA: Diagnosis not present

## 2018-01-07 DIAGNOSIS — R1310 Dysphagia, unspecified: Secondary | ICD-10-CM | POA: Diagnosis not present

## 2018-01-07 DIAGNOSIS — J441 Chronic obstructive pulmonary disease with (acute) exacerbation: Secondary | ICD-10-CM | POA: Diagnosis not present

## 2018-01-07 DIAGNOSIS — J440 Chronic obstructive pulmonary disease with acute lower respiratory infection: Secondary | ICD-10-CM | POA: Diagnosis not present

## 2018-01-07 DIAGNOSIS — Z515 Encounter for palliative care: Secondary | ICD-10-CM | POA: Diagnosis not present

## 2018-01-07 DIAGNOSIS — J9602 Acute respiratory failure with hypercapnia: Secondary | ICD-10-CM | POA: Diagnosis not present

## 2018-01-07 DIAGNOSIS — E870 Hyperosmolality and hypernatremia: Secondary | ICD-10-CM | POA: Diagnosis not present

## 2018-01-07 DIAGNOSIS — J123 Human metapneumovirus pneumonia: Secondary | ICD-10-CM | POA: Diagnosis not present

## 2018-01-07 DIAGNOSIS — G9340 Encephalopathy, unspecified: Secondary | ICD-10-CM | POA: Diagnosis not present

## 2018-01-07 DIAGNOSIS — I517 Cardiomegaly: Secondary | ICD-10-CM | POA: Diagnosis not present

## 2018-01-07 DIAGNOSIS — T380X5A Adverse effect of glucocorticoids and synthetic analogues, initial encounter: Secondary | ICD-10-CM | POA: Diagnosis not present

## 2018-01-07 DIAGNOSIS — I248 Other forms of acute ischemic heart disease: Secondary | ICD-10-CM | POA: Diagnosis not present

## 2018-01-07 DIAGNOSIS — R69 Illness, unspecified: Secondary | ICD-10-CM | POA: Diagnosis not present

## 2018-01-07 DIAGNOSIS — J9622 Acute and chronic respiratory failure with hypercapnia: Secondary | ICD-10-CM | POA: Diagnosis not present

## 2018-01-07 DIAGNOSIS — I2510 Atherosclerotic heart disease of native coronary artery without angina pectoris: Secondary | ICD-10-CM | POA: Diagnosis not present

## 2018-01-07 DIAGNOSIS — Z931 Gastrostomy status: Secondary | ICD-10-CM | POA: Diagnosis not present

## 2018-01-07 DIAGNOSIS — E119 Type 2 diabetes mellitus without complications: Secondary | ICD-10-CM | POA: Diagnosis not present

## 2018-01-07 DIAGNOSIS — Z9911 Dependence on respirator [ventilator] status: Secondary | ICD-10-CM | POA: Diagnosis not present

## 2018-01-07 DIAGNOSIS — J9601 Acute respiratory failure with hypoxia: Secondary | ICD-10-CM | POA: Diagnosis not present

## 2018-01-07 DIAGNOSIS — I5030 Unspecified diastolic (congestive) heart failure: Secondary | ICD-10-CM | POA: Diagnosis not present

## 2018-01-07 DIAGNOSIS — J9690 Respiratory failure, unspecified, unspecified whether with hypoxia or hypercapnia: Secondary | ICD-10-CM | POA: Diagnosis not present

## 2018-01-08 DIAGNOSIS — D638 Anemia in other chronic diseases classified elsewhere: Secondary | ICD-10-CM | POA: Diagnosis not present

## 2018-01-08 DIAGNOSIS — J123 Human metapneumovirus pneumonia: Secondary | ICD-10-CM | POA: Diagnosis not present

## 2018-01-08 DIAGNOSIS — R918 Other nonspecific abnormal finding of lung field: Secondary | ICD-10-CM | POA: Diagnosis not present

## 2018-01-08 DIAGNOSIS — J441 Chronic obstructive pulmonary disease with (acute) exacerbation: Secondary | ICD-10-CM | POA: Diagnosis not present

## 2018-01-08 DIAGNOSIS — G9340 Encephalopathy, unspecified: Secondary | ICD-10-CM | POA: Diagnosis not present

## 2018-01-08 DIAGNOSIS — K5900 Constipation, unspecified: Secondary | ICD-10-CM | POA: Diagnosis not present

## 2018-01-08 DIAGNOSIS — J9622 Acute and chronic respiratory failure with hypercapnia: Secondary | ICD-10-CM | POA: Diagnosis not present

## 2018-01-08 DIAGNOSIS — Z9989 Dependence on other enabling machines and devices: Secondary | ICD-10-CM | POA: Diagnosis not present

## 2018-01-08 DIAGNOSIS — I5030 Unspecified diastolic (congestive) heart failure: Secondary | ICD-10-CM | POA: Diagnosis not present

## 2018-01-08 DIAGNOSIS — I110 Hypertensive heart disease with heart failure: Secondary | ICD-10-CM | POA: Diagnosis not present

## 2018-01-08 DIAGNOSIS — J9621 Acute and chronic respiratory failure with hypoxia: Secondary | ICD-10-CM | POA: Diagnosis not present

## 2018-01-09 ENCOUNTER — Other Ambulatory Visit: Payer: Self-pay | Admitting: *Deleted

## 2018-01-09 DIAGNOSIS — I110 Hypertensive heart disease with heart failure: Secondary | ICD-10-CM | POA: Diagnosis not present

## 2018-01-09 DIAGNOSIS — R740 Nonspecific elevation of levels of transaminase and lactic acid dehydrogenase [LDH]: Secondary | ICD-10-CM | POA: Diagnosis not present

## 2018-01-09 DIAGNOSIS — J9602 Acute respiratory failure with hypercapnia: Secondary | ICD-10-CM | POA: Diagnosis not present

## 2018-01-09 DIAGNOSIS — J123 Human metapneumovirus pneumonia: Secondary | ICD-10-CM | POA: Diagnosis not present

## 2018-01-09 DIAGNOSIS — J441 Chronic obstructive pulmonary disease with (acute) exacerbation: Secondary | ICD-10-CM | POA: Diagnosis not present

## 2018-01-09 DIAGNOSIS — K5900 Constipation, unspecified: Secondary | ICD-10-CM | POA: Diagnosis not present

## 2018-01-09 DIAGNOSIS — R1310 Dysphagia, unspecified: Secondary | ICD-10-CM | POA: Diagnosis not present

## 2018-01-09 DIAGNOSIS — J9601 Acute respiratory failure with hypoxia: Secondary | ICD-10-CM | POA: Diagnosis not present

## 2018-01-09 DIAGNOSIS — I5030 Unspecified diastolic (congestive) heart failure: Secondary | ICD-10-CM | POA: Diagnosis not present

## 2018-01-09 DIAGNOSIS — G9340 Encephalopathy, unspecified: Secondary | ICD-10-CM | POA: Diagnosis not present

## 2018-01-09 MED ORDER — ASPIRIN EC 81 MG PO TBEC
81.00 | DELAYED_RELEASE_TABLET | ORAL | Status: DC
Start: 2018-01-10 — End: 2018-01-09

## 2018-01-09 MED ORDER — BUDESONIDE 0.5 MG/2ML IN SUSP
0.50 | RESPIRATORY_TRACT | Status: DC
Start: 2018-01-13 — End: 2018-01-09

## 2018-01-09 MED ORDER — ENOXAPARIN SODIUM 40 MG/0.4ML ~~LOC~~ SOLN
40.00 | SUBCUTANEOUS | Status: DC
Start: 2018-01-14 — End: 2018-01-09

## 2018-01-09 MED ORDER — GENERIC EXTERNAL MEDICATION
250.00 | Status: DC
Start: 2018-01-10 — End: 2018-01-09

## 2018-01-09 MED ORDER — SERTRALINE HCL 25 MG PO TABS
25.00 | ORAL_TABLET | ORAL | Status: DC
Start: 2018-01-14 — End: 2018-01-09

## 2018-01-09 MED ORDER — GENERIC EXTERNAL MEDICATION
2.50 | Status: DC
Start: 2018-01-09 — End: 2018-01-09

## 2018-01-09 MED ORDER — ARFORMOTEROL TARTRATE 15 MCG/2ML IN NEBU
15.00 | INHALATION_SOLUTION | RESPIRATORY_TRACT | Status: DC
Start: 2018-01-13 — End: 2018-01-09

## 2018-01-09 MED ORDER — IPRATROPIUM-ALBUTEROL 0.5-2.5 (3) MG/3ML IN SOLN
3.00 | RESPIRATORY_TRACT | Status: DC
Start: 2018-01-13 — End: 2018-01-09

## 2018-01-09 MED ORDER — GENERIC EXTERNAL MEDICATION
17.00 | Status: DC
Start: ? — End: 2018-01-09

## 2018-01-09 MED ORDER — ALBUTEROL SULFATE (2.5 MG/3ML) 0.083% IN NEBU
2.50 | INHALATION_SOLUTION | RESPIRATORY_TRACT | Status: DC
Start: ? — End: 2018-01-09

## 2018-01-09 MED ORDER — METHYLPREDNISOLONE SODIUM SUCC 125 MG IJ SOLR
40.00 | INTRAMUSCULAR | Status: DC
Start: 2018-01-10 — End: 2018-01-09

## 2018-01-09 MED ORDER — MUPIROCIN 2 % EX OINT
TOPICAL_OINTMENT | CUTANEOUS | Status: DC
Start: 2018-01-09 — End: 2018-01-09

## 2018-01-09 NOTE — Patient Outreach (Signed)
Outreach telephone call to patient's son [name] for telephonic assessment, spoke with [name], HIPAA verified, [name] reports pt has been at [facility] since 01/06/18 for aspiration pneumonia and is expected to be there at least all this week.  RN CM discussed plan of care and will outreach [name] end of this week.  PLAN Call [name] end of this week  Irving Shows [facility], BSN [facility] Care Coordinator [phone]

## 2018-01-10 DIAGNOSIS — J9601 Acute respiratory failure with hypoxia: Secondary | ICD-10-CM | POA: Diagnosis not present

## 2018-01-10 DIAGNOSIS — R1310 Dysphagia, unspecified: Secondary | ICD-10-CM | POA: Diagnosis not present

## 2018-01-10 DIAGNOSIS — R740 Nonspecific elevation of levels of transaminase and lactic acid dehydrogenase [LDH]: Secondary | ICD-10-CM | POA: Diagnosis not present

## 2018-01-10 DIAGNOSIS — J123 Human metapneumovirus pneumonia: Secondary | ICD-10-CM | POA: Diagnosis not present

## 2018-01-10 DIAGNOSIS — G9340 Encephalopathy, unspecified: Secondary | ICD-10-CM | POA: Diagnosis not present

## 2018-01-10 DIAGNOSIS — J441 Chronic obstructive pulmonary disease with (acute) exacerbation: Secondary | ICD-10-CM | POA: Diagnosis not present

## 2018-01-10 DIAGNOSIS — J9602 Acute respiratory failure with hypercapnia: Secondary | ICD-10-CM | POA: Diagnosis not present

## 2018-01-10 DIAGNOSIS — I110 Hypertensive heart disease with heart failure: Secondary | ICD-10-CM | POA: Diagnosis not present

## 2018-01-10 DIAGNOSIS — K5900 Constipation, unspecified: Secondary | ICD-10-CM | POA: Diagnosis not present

## 2018-01-10 DIAGNOSIS — I5030 Unspecified diastolic (congestive) heart failure: Secondary | ICD-10-CM | POA: Diagnosis not present

## 2018-01-11 DIAGNOSIS — J9601 Acute respiratory failure with hypoxia: Secondary | ICD-10-CM | POA: Diagnosis not present

## 2018-01-11 DIAGNOSIS — I5030 Unspecified diastolic (congestive) heart failure: Secondary | ICD-10-CM | POA: Diagnosis not present

## 2018-01-11 DIAGNOSIS — R740 Nonspecific elevation of levels of transaminase and lactic acid dehydrogenase [LDH]: Secondary | ICD-10-CM | POA: Diagnosis not present

## 2018-01-11 DIAGNOSIS — K5900 Constipation, unspecified: Secondary | ICD-10-CM | POA: Diagnosis not present

## 2018-01-11 DIAGNOSIS — I110 Hypertensive heart disease with heart failure: Secondary | ICD-10-CM | POA: Diagnosis not present

## 2018-01-11 DIAGNOSIS — R1310 Dysphagia, unspecified: Secondary | ICD-10-CM | POA: Diagnosis not present

## 2018-01-11 DIAGNOSIS — J441 Chronic obstructive pulmonary disease with (acute) exacerbation: Secondary | ICD-10-CM | POA: Diagnosis not present

## 2018-01-11 DIAGNOSIS — I2510 Atherosclerotic heart disease of native coronary artery without angina pectoris: Secondary | ICD-10-CM | POA: Diagnosis not present

## 2018-01-11 DIAGNOSIS — B9781 Human metapneumovirus as the cause of diseases classified elsewhere: Secondary | ICD-10-CM | POA: Diagnosis not present

## 2018-01-11 DIAGNOSIS — J9602 Acute respiratory failure with hypercapnia: Secondary | ICD-10-CM | POA: Diagnosis not present

## 2018-01-12 DIAGNOSIS — Z9989 Dependence on other enabling machines and devices: Secondary | ICD-10-CM | POA: Diagnosis not present

## 2018-01-12 DIAGNOSIS — K5900 Constipation, unspecified: Secondary | ICD-10-CM | POA: Diagnosis not present

## 2018-01-12 DIAGNOSIS — R1310 Dysphagia, unspecified: Secondary | ICD-10-CM | POA: Diagnosis not present

## 2018-01-12 DIAGNOSIS — I110 Hypertensive heart disease with heart failure: Secondary | ICD-10-CM | POA: Diagnosis not present

## 2018-01-12 DIAGNOSIS — J9602 Acute respiratory failure with hypercapnia: Secondary | ICD-10-CM | POA: Diagnosis not present

## 2018-01-12 DIAGNOSIS — J441 Chronic obstructive pulmonary disease with (acute) exacerbation: Secondary | ICD-10-CM | POA: Diagnosis not present

## 2018-01-12 DIAGNOSIS — I2510 Atherosclerotic heart disease of native coronary artery without angina pectoris: Secondary | ICD-10-CM | POA: Diagnosis not present

## 2018-01-12 DIAGNOSIS — I5030 Unspecified diastolic (congestive) heart failure: Secondary | ICD-10-CM | POA: Diagnosis not present

## 2018-01-12 DIAGNOSIS — J9601 Acute respiratory failure with hypoxia: Secondary | ICD-10-CM | POA: Diagnosis not present

## 2018-01-12 DIAGNOSIS — B9781 Human metapneumovirus as the cause of diseases classified elsewhere: Secondary | ICD-10-CM | POA: Diagnosis not present

## 2018-01-13 ENCOUNTER — Other Ambulatory Visit: Payer: Self-pay | Admitting: *Deleted

## 2018-01-13 DIAGNOSIS — R7989 Other specified abnormal findings of blood chemistry: Secondary | ICD-10-CM | POA: Diagnosis not present

## 2018-01-13 DIAGNOSIS — J9602 Acute respiratory failure with hypercapnia: Secondary | ICD-10-CM | POA: Diagnosis not present

## 2018-01-13 DIAGNOSIS — Z66 Do not resuscitate: Secondary | ICD-10-CM | POA: Diagnosis not present

## 2018-01-13 DIAGNOSIS — K5900 Constipation, unspecified: Secondary | ICD-10-CM | POA: Diagnosis not present

## 2018-01-13 DIAGNOSIS — J123 Human metapneumovirus pneumonia: Secondary | ICD-10-CM | POA: Diagnosis not present

## 2018-01-13 DIAGNOSIS — E870 Hyperosmolality and hypernatremia: Secondary | ICD-10-CM | POA: Diagnosis not present

## 2018-01-13 DIAGNOSIS — J9601 Acute respiratory failure with hypoxia: Secondary | ICD-10-CM | POA: Diagnosis not present

## 2018-01-13 DIAGNOSIS — J441 Chronic obstructive pulmonary disease with (acute) exacerbation: Secondary | ICD-10-CM | POA: Diagnosis not present

## 2018-01-13 DIAGNOSIS — Z9981 Dependence on supplemental oxygen: Secondary | ICD-10-CM | POA: Diagnosis not present

## 2018-01-13 DIAGNOSIS — R1310 Dysphagia, unspecified: Secondary | ICD-10-CM | POA: Diagnosis not present

## 2018-01-13 MED ORDER — GENERIC EXTERNAL MEDICATION
Status: DC
Start: 2018-01-13 — End: 2018-01-13

## 2018-01-13 MED ORDER — MORPHINE SULFATE 4 MG/ML IJ SOLN
4.00 | INTRAMUSCULAR | Status: DC
Start: ? — End: 2018-01-13

## 2018-01-13 MED ORDER — ASPIRIN 81 MG PO CHEW
81.00 | CHEWABLE_TABLET | ORAL | Status: DC
Start: 2018-01-14 — End: 2018-01-13

## 2018-01-13 MED ORDER — PREDNISONE 10 MG PO TABS
40.00 | ORAL_TABLET | ORAL | Status: DC
Start: 2018-01-13 — End: 2018-01-13

## 2018-01-13 MED ORDER — GENERIC EXTERNAL MEDICATION
Status: DC
Start: ? — End: 2018-01-13

## 2018-01-13 MED ORDER — METOPROLOL TARTRATE 25 MG PO TABS
12.50 | ORAL_TABLET | ORAL | Status: DC
Start: 2018-01-13 — End: 2018-01-13

## 2018-01-13 MED ORDER — DEXTROSE 10 % IV SOLN
250.00 | INTRAVENOUS | Status: DC
Start: ? — End: 2018-01-13

## 2018-01-13 NOTE — Patient Outreach (Signed)
Telephone call to patient's son [name], HIPAA verified, [name] reports pt still admitted to [facility] with aspiration pneumonia and " another type of virus" and she is not doing well, pt is limited DNR.  Per [employer], pt is fourth floor ICU [facility] [facility].  PLAN Continue to follow  Irving Shows [facility], BSN [facility] Care Coordinator [phone]

## 2018-01-14 DIAGNOSIS — R1310 Dysphagia, unspecified: Secondary | ICD-10-CM | POA: Diagnosis not present

## 2018-01-14 DIAGNOSIS — E870 Hyperosmolality and hypernatremia: Secondary | ICD-10-CM | POA: Diagnosis not present

## 2018-01-14 DIAGNOSIS — J9601 Acute respiratory failure with hypoxia: Secondary | ICD-10-CM | POA: Diagnosis not present

## 2018-01-14 DIAGNOSIS — J9602 Acute respiratory failure with hypercapnia: Secondary | ICD-10-CM | POA: Diagnosis not present

## 2018-01-14 DIAGNOSIS — J123 Human metapneumovirus pneumonia: Secondary | ICD-10-CM | POA: Diagnosis not present

## 2018-01-14 DIAGNOSIS — Z9981 Dependence on supplemental oxygen: Secondary | ICD-10-CM | POA: Diagnosis not present

## 2018-01-14 DIAGNOSIS — K5900 Constipation, unspecified: Secondary | ICD-10-CM | POA: Diagnosis not present

## 2018-01-14 DIAGNOSIS — J441 Chronic obstructive pulmonary disease with (acute) exacerbation: Secondary | ICD-10-CM | POA: Diagnosis not present

## 2018-01-14 DIAGNOSIS — R7989 Other specified abnormal findings of blood chemistry: Secondary | ICD-10-CM | POA: Diagnosis not present

## 2018-01-14 DIAGNOSIS — Z66 Do not resuscitate: Secondary | ICD-10-CM | POA: Diagnosis not present

## 2018-01-15 DIAGNOSIS — R1310 Dysphagia, unspecified: Secondary | ICD-10-CM | POA: Diagnosis not present

## 2018-01-15 DIAGNOSIS — J9602 Acute respiratory failure with hypercapnia: Secondary | ICD-10-CM | POA: Diagnosis not present

## 2018-01-15 DIAGNOSIS — J123 Human metapneumovirus pneumonia: Secondary | ICD-10-CM | POA: Diagnosis not present

## 2018-01-15 DIAGNOSIS — Z9989 Dependence on other enabling machines and devices: Secondary | ICD-10-CM | POA: Diagnosis not present

## 2018-01-15 DIAGNOSIS — J9601 Acute respiratory failure with hypoxia: Secondary | ICD-10-CM | POA: Diagnosis not present

## 2018-01-15 DIAGNOSIS — J9610 Chronic respiratory failure, unspecified whether with hypoxia or hypercapnia: Secondary | ICD-10-CM | POA: Diagnosis not present

## 2018-01-15 DIAGNOSIS — J449 Chronic obstructive pulmonary disease, unspecified: Secondary | ICD-10-CM | POA: Diagnosis not present

## 2018-01-15 DIAGNOSIS — R740 Nonspecific elevation of levels of transaminase and lactic acid dehydrogenase [LDH]: Secondary | ICD-10-CM | POA: Diagnosis not present

## 2018-01-15 DIAGNOSIS — Z7952 Long term (current) use of systemic steroids: Secondary | ICD-10-CM | POA: Diagnosis not present

## 2018-01-15 DIAGNOSIS — J441 Chronic obstructive pulmonary disease with (acute) exacerbation: Secondary | ICD-10-CM | POA: Diagnosis not present

## 2018-01-15 DIAGNOSIS — E870 Hyperosmolality and hypernatremia: Secondary | ICD-10-CM | POA: Diagnosis not present

## 2018-01-15 DIAGNOSIS — K5900 Constipation, unspecified: Secondary | ICD-10-CM | POA: Diagnosis not present

## 2018-01-16 DIAGNOSIS — K5900 Constipation, unspecified: Secondary | ICD-10-CM | POA: Diagnosis not present

## 2018-01-16 DIAGNOSIS — E870 Hyperosmolality and hypernatremia: Secondary | ICD-10-CM | POA: Diagnosis not present

## 2018-01-16 DIAGNOSIS — L98419 Non-pressure chronic ulcer of buttock with unspecified severity: Secondary | ICD-10-CM | POA: Diagnosis not present

## 2018-01-16 DIAGNOSIS — Z9911 Dependence on respirator [ventilator] status: Secondary | ICD-10-CM | POA: Diagnosis not present

## 2018-01-16 DIAGNOSIS — J9601 Acute respiratory failure with hypoxia: Secondary | ICD-10-CM | POA: Diagnosis not present

## 2018-01-16 DIAGNOSIS — J9602 Acute respiratory failure with hypercapnia: Secondary | ICD-10-CM | POA: Diagnosis not present

## 2018-01-16 DIAGNOSIS — I5031 Acute diastolic (congestive) heart failure: Secondary | ICD-10-CM | POA: Diagnosis not present

## 2018-01-16 DIAGNOSIS — J441 Chronic obstructive pulmonary disease with (acute) exacerbation: Secondary | ICD-10-CM | POA: Diagnosis not present

## 2018-01-16 DIAGNOSIS — J123 Human metapneumovirus pneumonia: Secondary | ICD-10-CM | POA: Diagnosis not present

## 2018-01-16 DIAGNOSIS — R7989 Other specified abnormal findings of blood chemistry: Secondary | ICD-10-CM | POA: Diagnosis not present

## 2018-01-17 DIAGNOSIS — L98419 Non-pressure chronic ulcer of buttock with unspecified severity: Secondary | ICD-10-CM | POA: Diagnosis not present

## 2018-01-17 DIAGNOSIS — J441 Chronic obstructive pulmonary disease with (acute) exacerbation: Secondary | ICD-10-CM | POA: Diagnosis not present

## 2018-01-17 DIAGNOSIS — J123 Human metapneumovirus pneumonia: Secondary | ICD-10-CM | POA: Diagnosis not present

## 2018-01-17 DIAGNOSIS — E870 Hyperosmolality and hypernatremia: Secondary | ICD-10-CM | POA: Diagnosis not present

## 2018-01-17 DIAGNOSIS — R7989 Other specified abnormal findings of blood chemistry: Secondary | ICD-10-CM | POA: Diagnosis not present

## 2018-01-17 DIAGNOSIS — J9602 Acute respiratory failure with hypercapnia: Secondary | ICD-10-CM | POA: Diagnosis not present

## 2018-01-17 DIAGNOSIS — K5900 Constipation, unspecified: Secondary | ICD-10-CM | POA: Diagnosis not present

## 2018-01-17 DIAGNOSIS — Z9911 Dependence on respirator [ventilator] status: Secondary | ICD-10-CM | POA: Diagnosis not present

## 2018-01-17 DIAGNOSIS — I5031 Acute diastolic (congestive) heart failure: Secondary | ICD-10-CM | POA: Diagnosis not present

## 2018-01-17 DIAGNOSIS — J9601 Acute respiratory failure with hypoxia: Secondary | ICD-10-CM | POA: Diagnosis not present

## 2018-01-18 DIAGNOSIS — Z9911 Dependence on respirator [ventilator] status: Secondary | ICD-10-CM | POA: Diagnosis not present

## 2018-01-18 DIAGNOSIS — I5031 Acute diastolic (congestive) heart failure: Secondary | ICD-10-CM | POA: Diagnosis not present

## 2018-01-18 DIAGNOSIS — J9601 Acute respiratory failure with hypoxia: Secondary | ICD-10-CM | POA: Diagnosis not present

## 2018-01-18 DIAGNOSIS — E870 Hyperosmolality and hypernatremia: Secondary | ICD-10-CM | POA: Diagnosis not present

## 2018-01-18 DIAGNOSIS — J123 Human metapneumovirus pneumonia: Secondary | ICD-10-CM | POA: Diagnosis not present

## 2018-01-18 DIAGNOSIS — K5900 Constipation, unspecified: Secondary | ICD-10-CM | POA: Diagnosis not present

## 2018-01-18 DIAGNOSIS — R7989 Other specified abnormal findings of blood chemistry: Secondary | ICD-10-CM | POA: Diagnosis not present

## 2018-01-18 DIAGNOSIS — J441 Chronic obstructive pulmonary disease with (acute) exacerbation: Secondary | ICD-10-CM | POA: Diagnosis not present

## 2018-01-18 DIAGNOSIS — L98419 Non-pressure chronic ulcer of buttock with unspecified severity: Secondary | ICD-10-CM | POA: Diagnosis not present

## 2018-01-18 DIAGNOSIS — J9602 Acute respiratory failure with hypercapnia: Secondary | ICD-10-CM | POA: Diagnosis not present

## 2018-01-19 DIAGNOSIS — I5030 Unspecified diastolic (congestive) heart failure: Secondary | ICD-10-CM | POA: Diagnosis not present

## 2018-01-19 DIAGNOSIS — J9602 Acute respiratory failure with hypercapnia: Secondary | ICD-10-CM | POA: Diagnosis not present

## 2018-01-19 DIAGNOSIS — Z515 Encounter for palliative care: Secondary | ICD-10-CM | POA: Diagnosis not present

## 2018-01-19 DIAGNOSIS — I2510 Atherosclerotic heart disease of native coronary artery without angina pectoris: Secondary | ICD-10-CM | POA: Diagnosis not present

## 2018-01-19 DIAGNOSIS — J441 Chronic obstructive pulmonary disease with (acute) exacerbation: Secondary | ICD-10-CM | POA: Diagnosis not present

## 2018-01-19 DIAGNOSIS — J9601 Acute respiratory failure with hypoxia: Secondary | ICD-10-CM | POA: Diagnosis not present

## 2018-01-19 DIAGNOSIS — I110 Hypertensive heart disease with heart failure: Secondary | ICD-10-CM | POA: Diagnosis not present

## 2018-01-19 DIAGNOSIS — K5900 Constipation, unspecified: Secondary | ICD-10-CM | POA: Diagnosis not present

## 2018-01-19 DIAGNOSIS — J449 Chronic obstructive pulmonary disease, unspecified: Secondary | ICD-10-CM | POA: Diagnosis not present

## 2018-01-19 DIAGNOSIS — R740 Nonspecific elevation of levels of transaminase and lactic acid dehydrogenase [LDH]: Secondary | ICD-10-CM | POA: Diagnosis not present

## 2018-01-19 DIAGNOSIS — J9690 Respiratory failure, unspecified, unspecified whether with hypoxia or hypercapnia: Secondary | ICD-10-CM | POA: Diagnosis not present

## 2018-01-19 DIAGNOSIS — Z9989 Dependence on other enabling machines and devices: Secondary | ICD-10-CM | POA: Diagnosis not present

## 2018-01-19 DIAGNOSIS — J123 Human metapneumovirus pneumonia: Secondary | ICD-10-CM | POA: Diagnosis not present

## 2018-01-19 DIAGNOSIS — Z7189 Other specified counseling: Secondary | ICD-10-CM | POA: Diagnosis not present

## 2018-01-20 ENCOUNTER — Other Ambulatory Visit: Payer: Self-pay | Admitting: *Deleted

## 2018-01-20 DIAGNOSIS — K5900 Constipation, unspecified: Secondary | ICD-10-CM | POA: Diagnosis not present

## 2018-01-20 DIAGNOSIS — J9690 Respiratory failure, unspecified, unspecified whether with hypoxia or hypercapnia: Secondary | ICD-10-CM | POA: Diagnosis not present

## 2018-01-20 DIAGNOSIS — J441 Chronic obstructive pulmonary disease with (acute) exacerbation: Secondary | ICD-10-CM | POA: Diagnosis not present

## 2018-01-20 DIAGNOSIS — E870 Hyperosmolality and hypernatremia: Secondary | ICD-10-CM | POA: Diagnosis not present

## 2018-01-20 DIAGNOSIS — L98419 Non-pressure chronic ulcer of buttock with unspecified severity: Secondary | ICD-10-CM | POA: Diagnosis not present

## 2018-01-20 DIAGNOSIS — R7989 Other specified abnormal findings of blood chemistry: Secondary | ICD-10-CM | POA: Diagnosis not present

## 2018-01-20 DIAGNOSIS — Z7189 Other specified counseling: Secondary | ICD-10-CM | POA: Diagnosis not present

## 2018-01-20 DIAGNOSIS — I5031 Acute diastolic (congestive) heart failure: Secondary | ICD-10-CM | POA: Diagnosis not present

## 2018-01-20 DIAGNOSIS — J123 Human metapneumovirus pneumonia: Secondary | ICD-10-CM | POA: Diagnosis not present

## 2018-01-20 DIAGNOSIS — J9601 Acute respiratory failure with hypoxia: Secondary | ICD-10-CM | POA: Diagnosis not present

## 2018-01-20 DIAGNOSIS — J9602 Acute respiratory failure with hypercapnia: Secondary | ICD-10-CM | POA: Diagnosis not present

## 2018-01-20 DIAGNOSIS — Z515 Encounter for palliative care: Secondary | ICD-10-CM | POA: Diagnosis not present

## 2018-01-20 DIAGNOSIS — J449 Chronic obstructive pulmonary disease, unspecified: Secondary | ICD-10-CM | POA: Diagnosis not present

## 2018-01-20 DIAGNOSIS — Z9911 Dependence on respirator [ventilator] status: Secondary | ICD-10-CM | POA: Diagnosis not present

## 2018-01-20 NOTE — Patient Outreach (Signed)
Pt hospitalized greater than 10 days at [facility], pt now has primary care MD with [facility] system Deliah Boston FNP (not in [facility] network), Case closed, RN CM mailed case closure letter to patient's home, RN CM tried to call patient's son [name] at [phone] with no answer and mailbox full.  Close case  Irving Shows [facility], BSN [facility] Care Coordinator [phone]

## 2018-01-21 DIAGNOSIS — K5900 Constipation, unspecified: Secondary | ICD-10-CM | POA: Diagnosis not present

## 2018-01-21 DIAGNOSIS — R7989 Other specified abnormal findings of blood chemistry: Secondary | ICD-10-CM | POA: Diagnosis not present

## 2018-01-21 DIAGNOSIS — J123 Human metapneumovirus pneumonia: Secondary | ICD-10-CM | POA: Diagnosis not present

## 2018-01-21 DIAGNOSIS — J9602 Acute respiratory failure with hypercapnia: Secondary | ICD-10-CM | POA: Diagnosis not present

## 2018-01-21 DIAGNOSIS — E870 Hyperosmolality and hypernatremia: Secondary | ICD-10-CM | POA: Diagnosis not present

## 2018-01-21 DIAGNOSIS — J441 Chronic obstructive pulmonary disease with (acute) exacerbation: Secondary | ICD-10-CM | POA: Diagnosis not present

## 2018-01-21 DIAGNOSIS — I5031 Acute diastolic (congestive) heart failure: Secondary | ICD-10-CM | POA: Diagnosis not present

## 2018-01-21 DIAGNOSIS — L98419 Non-pressure chronic ulcer of buttock with unspecified severity: Secondary | ICD-10-CM | POA: Diagnosis not present

## 2018-01-21 DIAGNOSIS — Z9911 Dependence on respirator [ventilator] status: Secondary | ICD-10-CM | POA: Diagnosis not present

## 2018-01-21 DIAGNOSIS — J9601 Acute respiratory failure with hypoxia: Secondary | ICD-10-CM | POA: Diagnosis not present

## 2018-01-22 DIAGNOSIS — R740 Nonspecific elevation of levels of transaminase and lactic acid dehydrogenase [LDH]: Secondary | ICD-10-CM | POA: Diagnosis not present

## 2018-01-22 DIAGNOSIS — Z9989 Dependence on other enabling machines and devices: Secondary | ICD-10-CM | POA: Diagnosis not present

## 2018-01-22 DIAGNOSIS — K5900 Constipation, unspecified: Secondary | ICD-10-CM | POA: Diagnosis not present

## 2018-01-22 DIAGNOSIS — I2510 Atherosclerotic heart disease of native coronary artery without angina pectoris: Secondary | ICD-10-CM | POA: Diagnosis not present

## 2018-01-22 DIAGNOSIS — I5030 Unspecified diastolic (congestive) heart failure: Secondary | ICD-10-CM | POA: Diagnosis not present

## 2018-01-22 DIAGNOSIS — J441 Chronic obstructive pulmonary disease with (acute) exacerbation: Secondary | ICD-10-CM | POA: Diagnosis not present

## 2018-01-22 DIAGNOSIS — J9601 Acute respiratory failure with hypoxia: Secondary | ICD-10-CM | POA: Diagnosis not present

## 2018-01-22 DIAGNOSIS — J9602 Acute respiratory failure with hypercapnia: Secondary | ICD-10-CM | POA: Diagnosis not present

## 2018-01-22 DIAGNOSIS — I110 Hypertensive heart disease with heart failure: Secondary | ICD-10-CM | POA: Diagnosis not present

## 2018-01-22 DIAGNOSIS — J123 Human metapneumovirus pneumonia: Secondary | ICD-10-CM | POA: Diagnosis not present

## 2018-01-23 DIAGNOSIS — N390 Urinary tract infection, site not specified: Secondary | ICD-10-CM | POA: Diagnosis not present

## 2018-01-23 DIAGNOSIS — Z9989 Dependence on other enabling machines and devices: Secondary | ICD-10-CM | POA: Diagnosis not present

## 2018-01-23 DIAGNOSIS — Z7982 Long term (current) use of aspirin: Secondary | ICD-10-CM | POA: Diagnosis not present

## 2018-01-23 DIAGNOSIS — J441 Chronic obstructive pulmonary disease with (acute) exacerbation: Secondary | ICD-10-CM | POA: Diagnosis not present

## 2018-01-23 DIAGNOSIS — R69 Illness, unspecified: Secondary | ICD-10-CM | POA: Diagnosis not present

## 2018-01-23 DIAGNOSIS — I5030 Unspecified diastolic (congestive) heart failure: Secondary | ICD-10-CM | POA: Diagnosis not present

## 2018-01-23 DIAGNOSIS — I2510 Atherosclerotic heart disease of native coronary artery without angina pectoris: Secondary | ICD-10-CM | POA: Diagnosis not present

## 2018-01-23 DIAGNOSIS — J123 Human metapneumovirus pneumonia: Secondary | ICD-10-CM | POA: Diagnosis not present

## 2018-01-23 DIAGNOSIS — I110 Hypertensive heart disease with heart failure: Secondary | ICD-10-CM | POA: Diagnosis not present

## 2018-01-23 DIAGNOSIS — J9621 Acute and chronic respiratory failure with hypoxia: Secondary | ICD-10-CM | POA: Diagnosis not present

## 2018-01-24 DIAGNOSIS — J9621 Acute and chronic respiratory failure with hypoxia: Secondary | ICD-10-CM | POA: Diagnosis not present

## 2018-01-24 DIAGNOSIS — J123 Human metapneumovirus pneumonia: Secondary | ICD-10-CM | POA: Diagnosis not present

## 2018-01-24 DIAGNOSIS — N390 Urinary tract infection, site not specified: Secondary | ICD-10-CM | POA: Diagnosis not present

## 2018-01-24 DIAGNOSIS — I2510 Atherosclerotic heart disease of native coronary artery without angina pectoris: Secondary | ICD-10-CM | POA: Diagnosis not present

## 2018-01-24 DIAGNOSIS — R69 Illness, unspecified: Secondary | ICD-10-CM | POA: Diagnosis not present

## 2018-01-24 DIAGNOSIS — J441 Chronic obstructive pulmonary disease with (acute) exacerbation: Secondary | ICD-10-CM | POA: Diagnosis not present

## 2018-01-24 DIAGNOSIS — I110 Hypertensive heart disease with heart failure: Secondary | ICD-10-CM | POA: Diagnosis not present

## 2018-01-24 DIAGNOSIS — I5030 Unspecified diastolic (congestive) heart failure: Secondary | ICD-10-CM | POA: Diagnosis not present

## 2018-01-24 DIAGNOSIS — Z7982 Long term (current) use of aspirin: Secondary | ICD-10-CM | POA: Diagnosis not present

## 2018-01-24 DIAGNOSIS — Z9989 Dependence on other enabling machines and devices: Secondary | ICD-10-CM | POA: Diagnosis not present

## 2018-01-25 DIAGNOSIS — I2510 Atherosclerotic heart disease of native coronary artery without angina pectoris: Secondary | ICD-10-CM | POA: Diagnosis not present

## 2018-01-25 DIAGNOSIS — J123 Human metapneumovirus pneumonia: Secondary | ICD-10-CM | POA: Diagnosis not present

## 2018-01-25 DIAGNOSIS — N390 Urinary tract infection, site not specified: Secondary | ICD-10-CM | POA: Diagnosis not present

## 2018-01-25 DIAGNOSIS — J9621 Acute and chronic respiratory failure with hypoxia: Secondary | ICD-10-CM | POA: Diagnosis not present

## 2018-01-25 DIAGNOSIS — J441 Chronic obstructive pulmonary disease with (acute) exacerbation: Secondary | ICD-10-CM | POA: Diagnosis not present

## 2018-01-25 DIAGNOSIS — R69 Illness, unspecified: Secondary | ICD-10-CM | POA: Diagnosis not present

## 2018-01-25 DIAGNOSIS — B9562 Methicillin resistant Staphylococcus aureus infection as the cause of diseases classified elsewhere: Secondary | ICD-10-CM | POA: Diagnosis not present

## 2018-01-25 DIAGNOSIS — I5030 Unspecified diastolic (congestive) heart failure: Secondary | ICD-10-CM | POA: Diagnosis not present

## 2018-01-25 DIAGNOSIS — I110 Hypertensive heart disease with heart failure: Secondary | ICD-10-CM | POA: Diagnosis not present

## 2018-01-25 DIAGNOSIS — Z9989 Dependence on other enabling machines and devices: Secondary | ICD-10-CM | POA: Diagnosis not present

## 2018-01-26 DIAGNOSIS — K5900 Constipation, unspecified: Secondary | ICD-10-CM | POA: Diagnosis not present

## 2018-01-26 DIAGNOSIS — I2510 Atherosclerotic heart disease of native coronary artery without angina pectoris: Secondary | ICD-10-CM | POA: Diagnosis not present

## 2018-01-26 DIAGNOSIS — Z9989 Dependence on other enabling machines and devices: Secondary | ICD-10-CM | POA: Diagnosis not present

## 2018-01-26 DIAGNOSIS — I110 Hypertensive heart disease with heart failure: Secondary | ICD-10-CM | POA: Diagnosis not present

## 2018-01-26 DIAGNOSIS — I5030 Unspecified diastolic (congestive) heart failure: Secondary | ICD-10-CM | POA: Diagnosis not present

## 2018-01-26 DIAGNOSIS — J441 Chronic obstructive pulmonary disease with (acute) exacerbation: Secondary | ICD-10-CM | POA: Diagnosis not present

## 2018-01-26 DIAGNOSIS — B9562 Methicillin resistant Staphylococcus aureus infection as the cause of diseases classified elsewhere: Secondary | ICD-10-CM | POA: Diagnosis not present

## 2018-01-26 DIAGNOSIS — J9622 Acute and chronic respiratory failure with hypercapnia: Secondary | ICD-10-CM | POA: Diagnosis not present

## 2018-01-26 DIAGNOSIS — J211 Acute bronchiolitis due to human metapneumovirus: Secondary | ICD-10-CM | POA: Diagnosis not present

## 2018-01-26 DIAGNOSIS — N390 Urinary tract infection, site not specified: Secondary | ICD-10-CM | POA: Diagnosis not present

## 2018-01-27 DIAGNOSIS — B9562 Methicillin resistant Staphylococcus aureus infection as the cause of diseases classified elsewhere: Secondary | ICD-10-CM | POA: Diagnosis not present

## 2018-01-27 DIAGNOSIS — I5030 Unspecified diastolic (congestive) heart failure: Secondary | ICD-10-CM | POA: Diagnosis not present

## 2018-01-27 DIAGNOSIS — K5900 Constipation, unspecified: Secondary | ICD-10-CM | POA: Diagnosis not present

## 2018-01-27 DIAGNOSIS — I110 Hypertensive heart disease with heart failure: Secondary | ICD-10-CM | POA: Diagnosis not present

## 2018-01-27 DIAGNOSIS — J441 Chronic obstructive pulmonary disease with (acute) exacerbation: Secondary | ICD-10-CM | POA: Diagnosis not present

## 2018-01-27 DIAGNOSIS — I2510 Atherosclerotic heart disease of native coronary artery without angina pectoris: Secondary | ICD-10-CM | POA: Diagnosis not present

## 2018-01-27 DIAGNOSIS — J9622 Acute and chronic respiratory failure with hypercapnia: Secondary | ICD-10-CM | POA: Diagnosis not present

## 2018-01-27 DIAGNOSIS — Z9989 Dependence on other enabling machines and devices: Secondary | ICD-10-CM | POA: Diagnosis not present

## 2018-01-27 DIAGNOSIS — J211 Acute bronchiolitis due to human metapneumovirus: Secondary | ICD-10-CM | POA: Diagnosis not present

## 2018-01-27 DIAGNOSIS — N390 Urinary tract infection, site not specified: Secondary | ICD-10-CM | POA: Diagnosis not present

## 2018-01-28 DIAGNOSIS — J9622 Acute and chronic respiratory failure with hypercapnia: Secondary | ICD-10-CM | POA: Diagnosis not present

## 2018-01-28 DIAGNOSIS — I2510 Atherosclerotic heart disease of native coronary artery without angina pectoris: Secondary | ICD-10-CM | POA: Diagnosis not present

## 2018-01-28 DIAGNOSIS — J123 Human metapneumovirus pneumonia: Secondary | ICD-10-CM | POA: Diagnosis not present

## 2018-01-28 DIAGNOSIS — I110 Hypertensive heart disease with heart failure: Secondary | ICD-10-CM | POA: Diagnosis not present

## 2018-01-28 DIAGNOSIS — Z7982 Long term (current) use of aspirin: Secondary | ICD-10-CM | POA: Diagnosis not present

## 2018-01-28 DIAGNOSIS — Z515 Encounter for palliative care: Secondary | ICD-10-CM | POA: Diagnosis not present

## 2018-01-28 DIAGNOSIS — N390 Urinary tract infection, site not specified: Secondary | ICD-10-CM | POA: Diagnosis not present

## 2018-01-28 DIAGNOSIS — I5030 Unspecified diastolic (congestive) heart failure: Secondary | ICD-10-CM | POA: Diagnosis not present

## 2018-01-28 DIAGNOSIS — J9602 Acute respiratory failure with hypercapnia: Secondary | ICD-10-CM | POA: Diagnosis not present

## 2018-01-28 DIAGNOSIS — J9621 Acute and chronic respiratory failure with hypoxia: Secondary | ICD-10-CM | POA: Diagnosis not present

## 2018-01-28 DIAGNOSIS — J9601 Acute respiratory failure with hypoxia: Secondary | ICD-10-CM | POA: Diagnosis not present

## 2018-01-28 DIAGNOSIS — Z9989 Dependence on other enabling machines and devices: Secondary | ICD-10-CM | POA: Diagnosis not present

## 2018-01-28 DIAGNOSIS — R7881 Bacteremia: Secondary | ICD-10-CM | POA: Diagnosis not present

## 2018-01-28 DIAGNOSIS — R69 Illness, unspecified: Secondary | ICD-10-CM | POA: Diagnosis not present

## 2018-01-28 DIAGNOSIS — J441 Chronic obstructive pulmonary disease with (acute) exacerbation: Secondary | ICD-10-CM | POA: Diagnosis not present

## 2018-01-28 DIAGNOSIS — Z66 Do not resuscitate: Secondary | ICD-10-CM | POA: Diagnosis not present

## 2018-01-28 DIAGNOSIS — B9562 Methicillin resistant Staphylococcus aureus infection as the cause of diseases classified elsewhere: Secondary | ICD-10-CM | POA: Diagnosis not present

## 2018-01-31 ENCOUNTER — Ambulatory Visit: Payer: Self-pay | Admitting: Urology

## 2018-02-25 DEATH — deceased

## 2018-09-15 IMAGING — US US EXTREM LOW VENOUS BILAT
1 series · 13 of 24 positions shown · non-contrast
Comparison: None.

CLINICAL DATA: 71-year-old female with a history of bilateral lower
extremity pain



[Series 1: us extrem low venous bilat · 0.08mm/px · 13 of 100 slices shown]
[im 1/100]
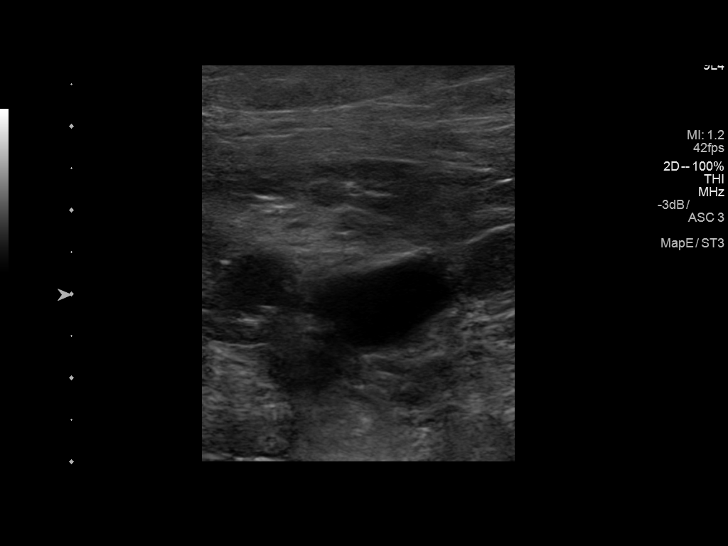
[im 9/100]
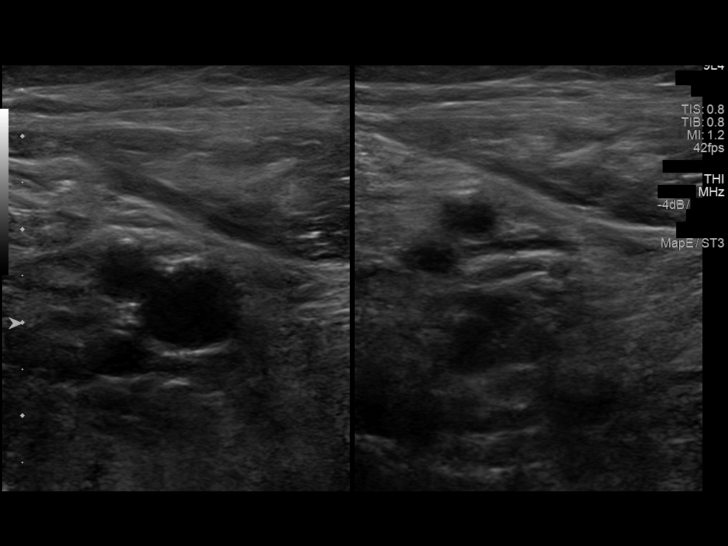
[im 18/100]
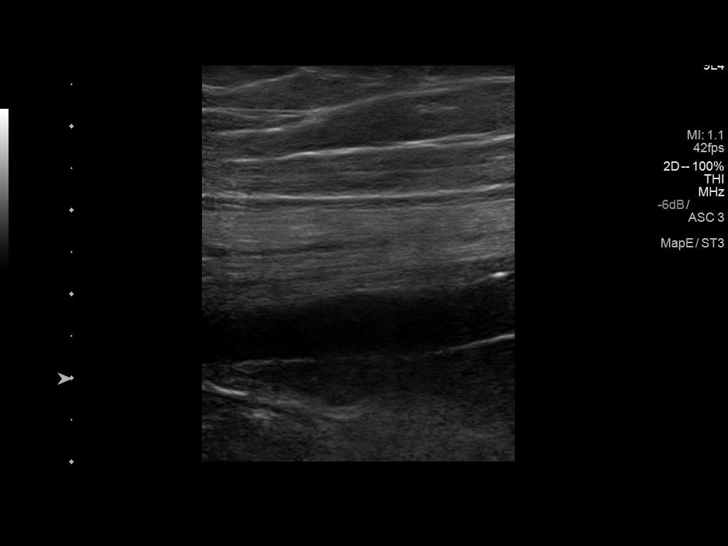
[im 26/100]
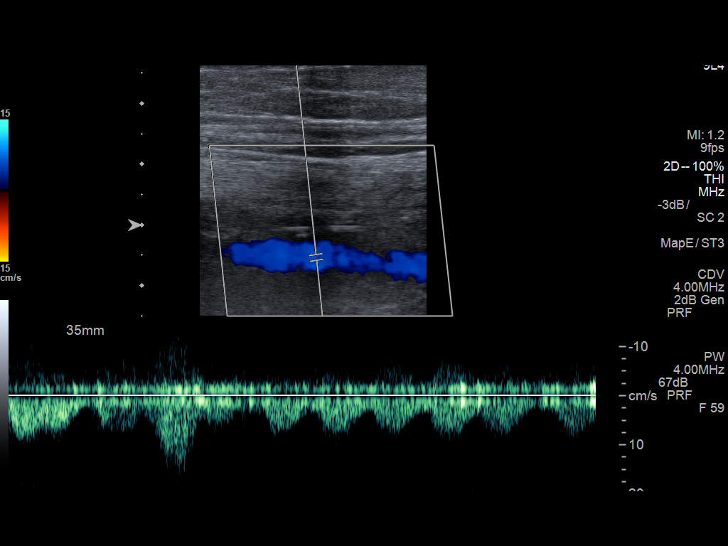
[im 35/100]
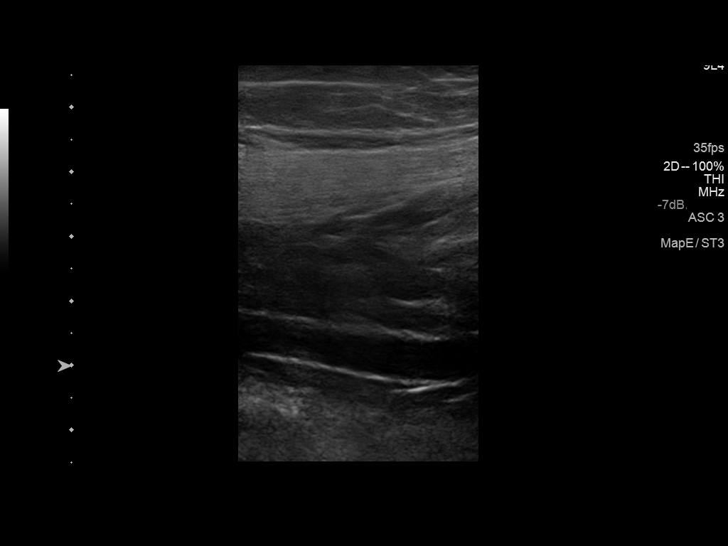
[im 44/100]
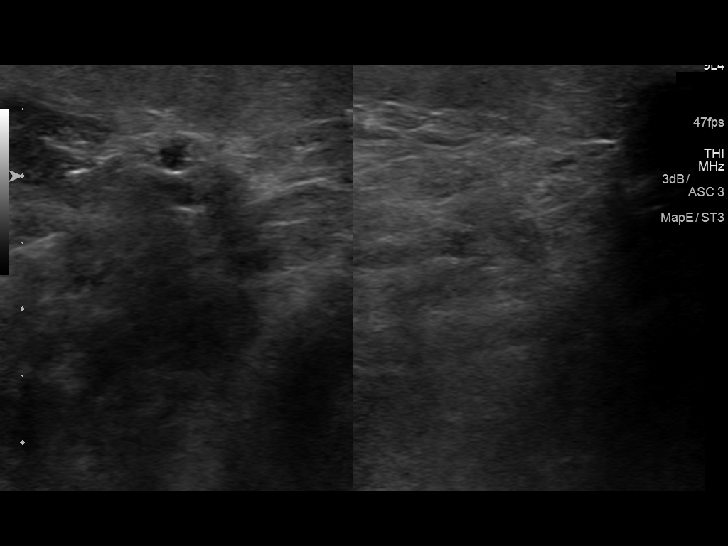
[im 52/100]
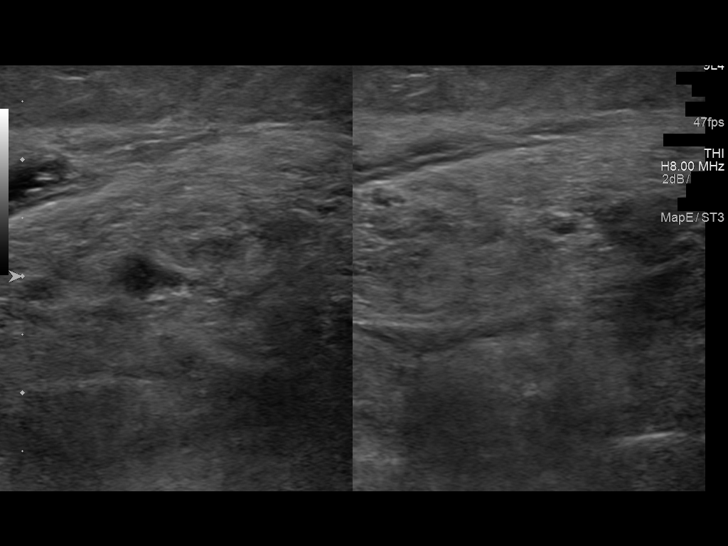
[im 56/100]
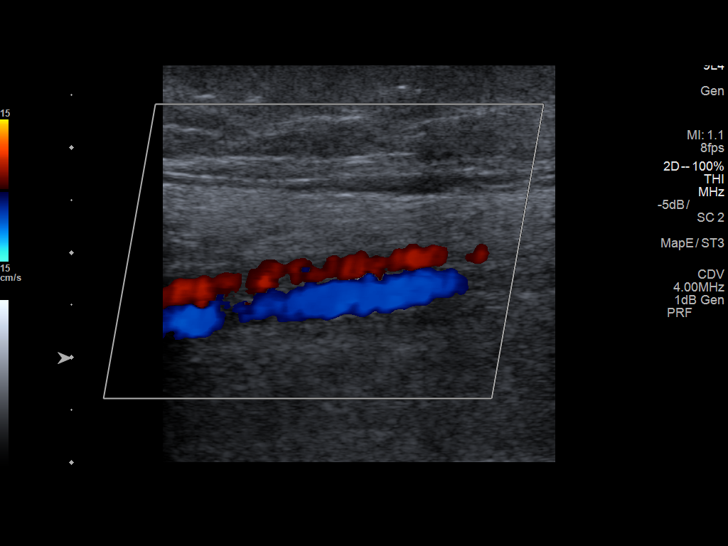
[im 65/100]
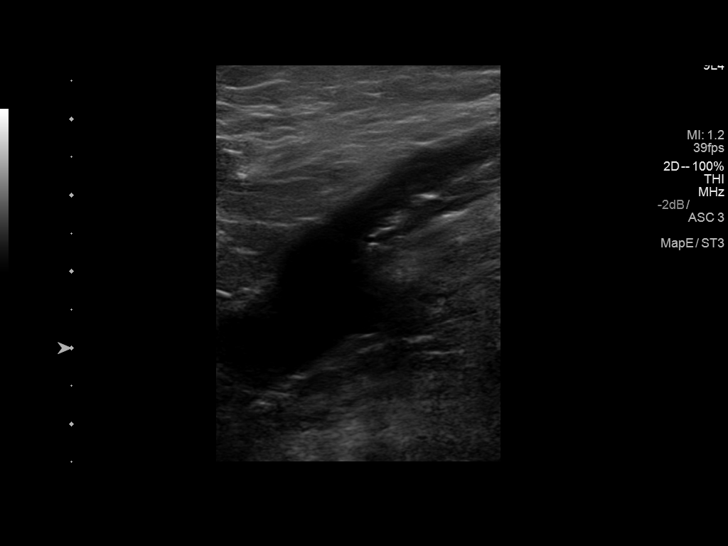
[im 74/100]
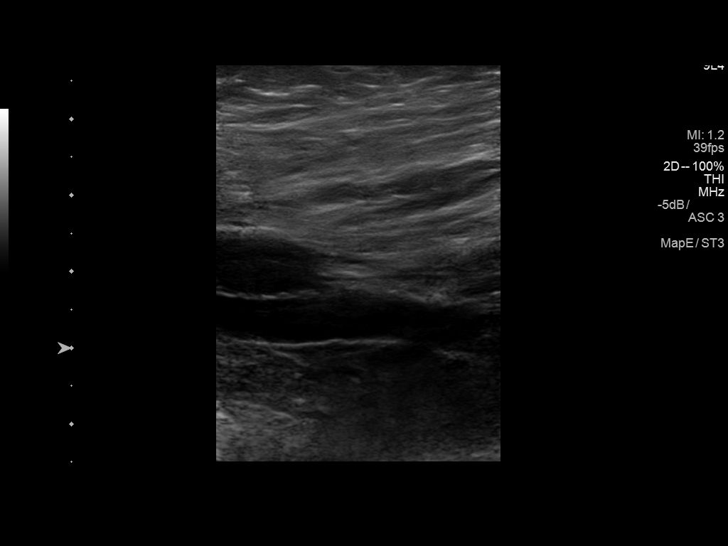
[im 82/100]
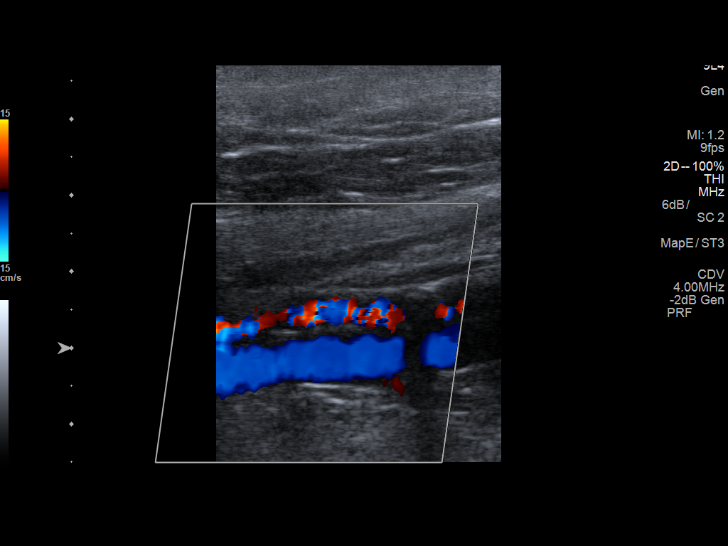
[im 91/100]
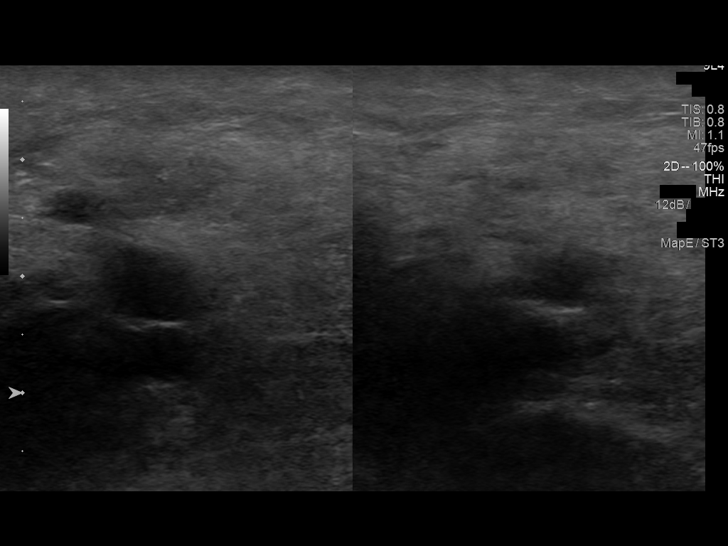
[im 100/100]
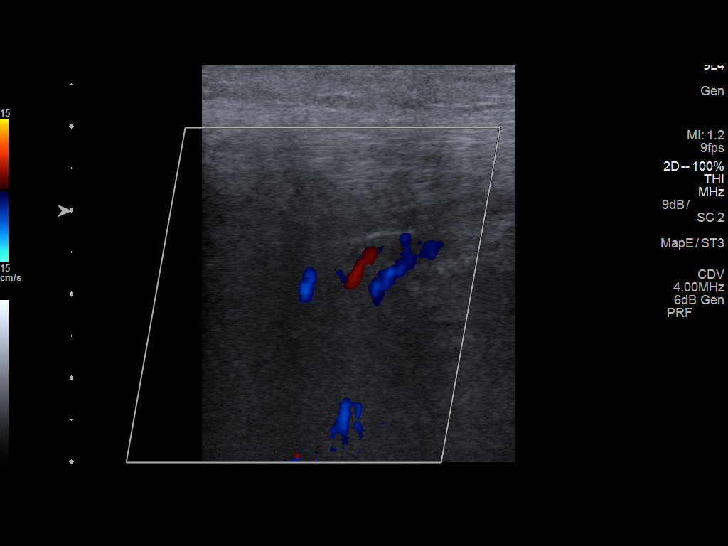

[13 of 24 positions shown; findings below may reference images not displayed]

FINDINGS: RIGHT LOWER EXTREMITY

Common Femoral Vein: No evidence of thrombus. Normal
compressibility, respiratory phasicity and response to augmentation.

Saphenofemoral Junction: No evidence of thrombus. Normal
compressibility and flow on color Doppler imaging.

Profunda Femoral Vein: No evidence of thrombus. Normal
compressibility and flow on color Doppler imaging.

Femoral Vein: No evidence of thrombus. Normal compressibility,
respiratory phasicity and response to augmentation.

Popliteal Vein: No evidence of thrombus. Normal compressibility,
respiratory phasicity and response to augmentation.

Calf Veins: No evidence of thrombus. Normal compressibility and flow
on color Doppler imaging.

Superficial Great Saphenous Vein: No evidence of thrombus. Normal
compressibility and flow on color Doppler imaging.

Other Findings:  None.

LEFT LOWER EXTREMITY

Common Femoral Vein: No evidence of thrombus. Normal
compressibility, respiratory phasicity and response to augmentation.

Saphenofemoral Junction: No evidence of thrombus. Normal
compressibility and flow on color Doppler imaging.

Profunda Femoral Vein: No evidence of thrombus. Normal
compressibility and flow on color Doppler imaging.

Femoral Vein: No evidence of thrombus. Normal compressibility,
respiratory phasicity and response to augmentation.

Popliteal Vein: No evidence of thrombus. Normal compressibility,
respiratory phasicity and response to augmentation.

Calf Veins: No evidence of thrombus. Normal compressibility and flow
on color Doppler imaging.

Superficial Great Saphenous Vein: No evidence of thrombus. Normal
compressibility and flow on color Doppler imaging.

Other Findings:  None.
IMPRESSION: Sonographic survey of the bilateral lower extremities negative for
DVT.

## 2018-12-02 IMAGING — CR DG CHEST 1V PORT
1 series · 1 of 1 positions shown · non-contrast
Comparison: Radiograph 11/09/2016

CLINICAL DATA: Shortness of breath tonight.

EXAM:
PORTABLE CHEST 1 VIEW

[portable]
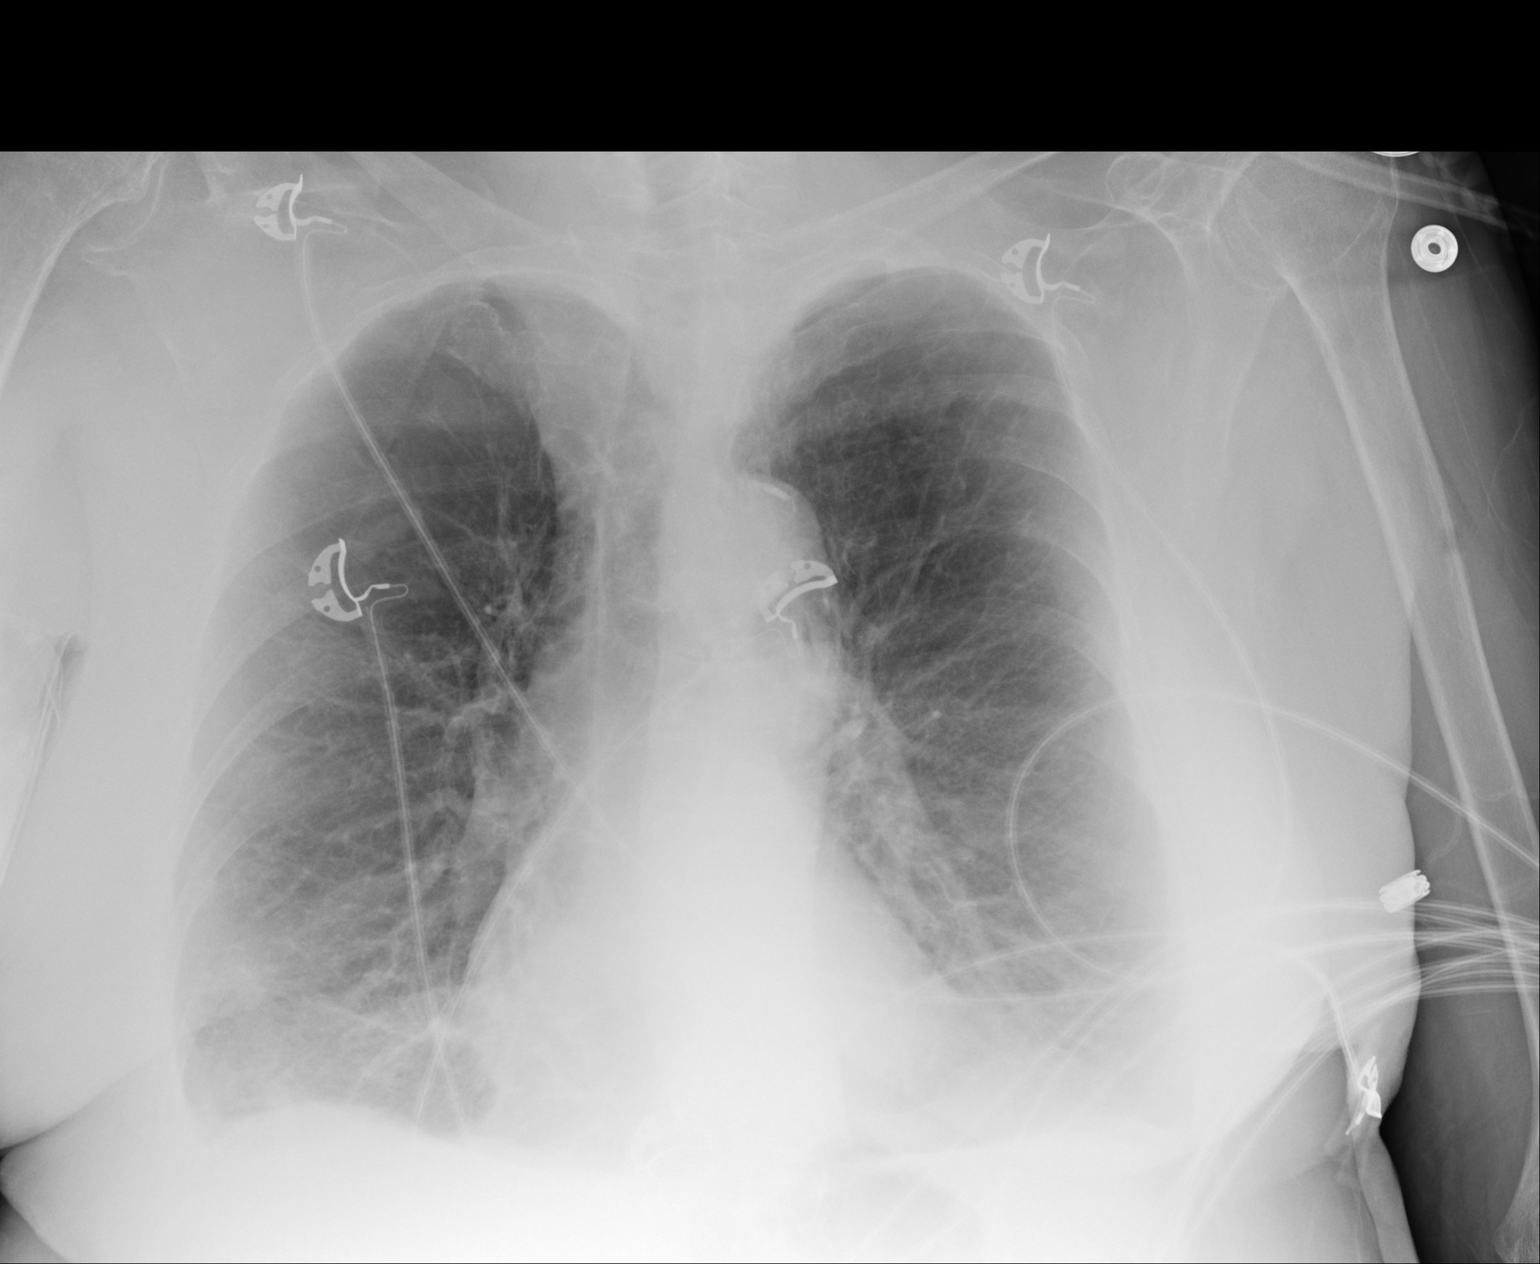

[1 of 1 positions shown; findings below may reference images not displayed]

FINDINGS: Unchanged heart size and mediastinal contours. Aortic arch
atherosclerosis. Increased vascular congestion. Mild peribronchial
cuffing may be pulmonary edema. Small bilateral pleural effusions.
Streaky bibasilar atelectasis. No pneumothorax.
IMPRESSION: Mild CHF with vascular congestion, small pleural effusions and mild
peribronchial thickening suspicious for pulmonary edema.

## 2019-01-05 IMAGING — DX DG CHEST 2V
2 series · 2 of 2 positions shown · non-contrast
Comparison: Chest radiograph dated 01/10/2017

CLINICAL DATA: 71-year-old female with CHF and COPD and shortness
of breath.

EXAM:
CHEST  2 VIEW

[chest lat]
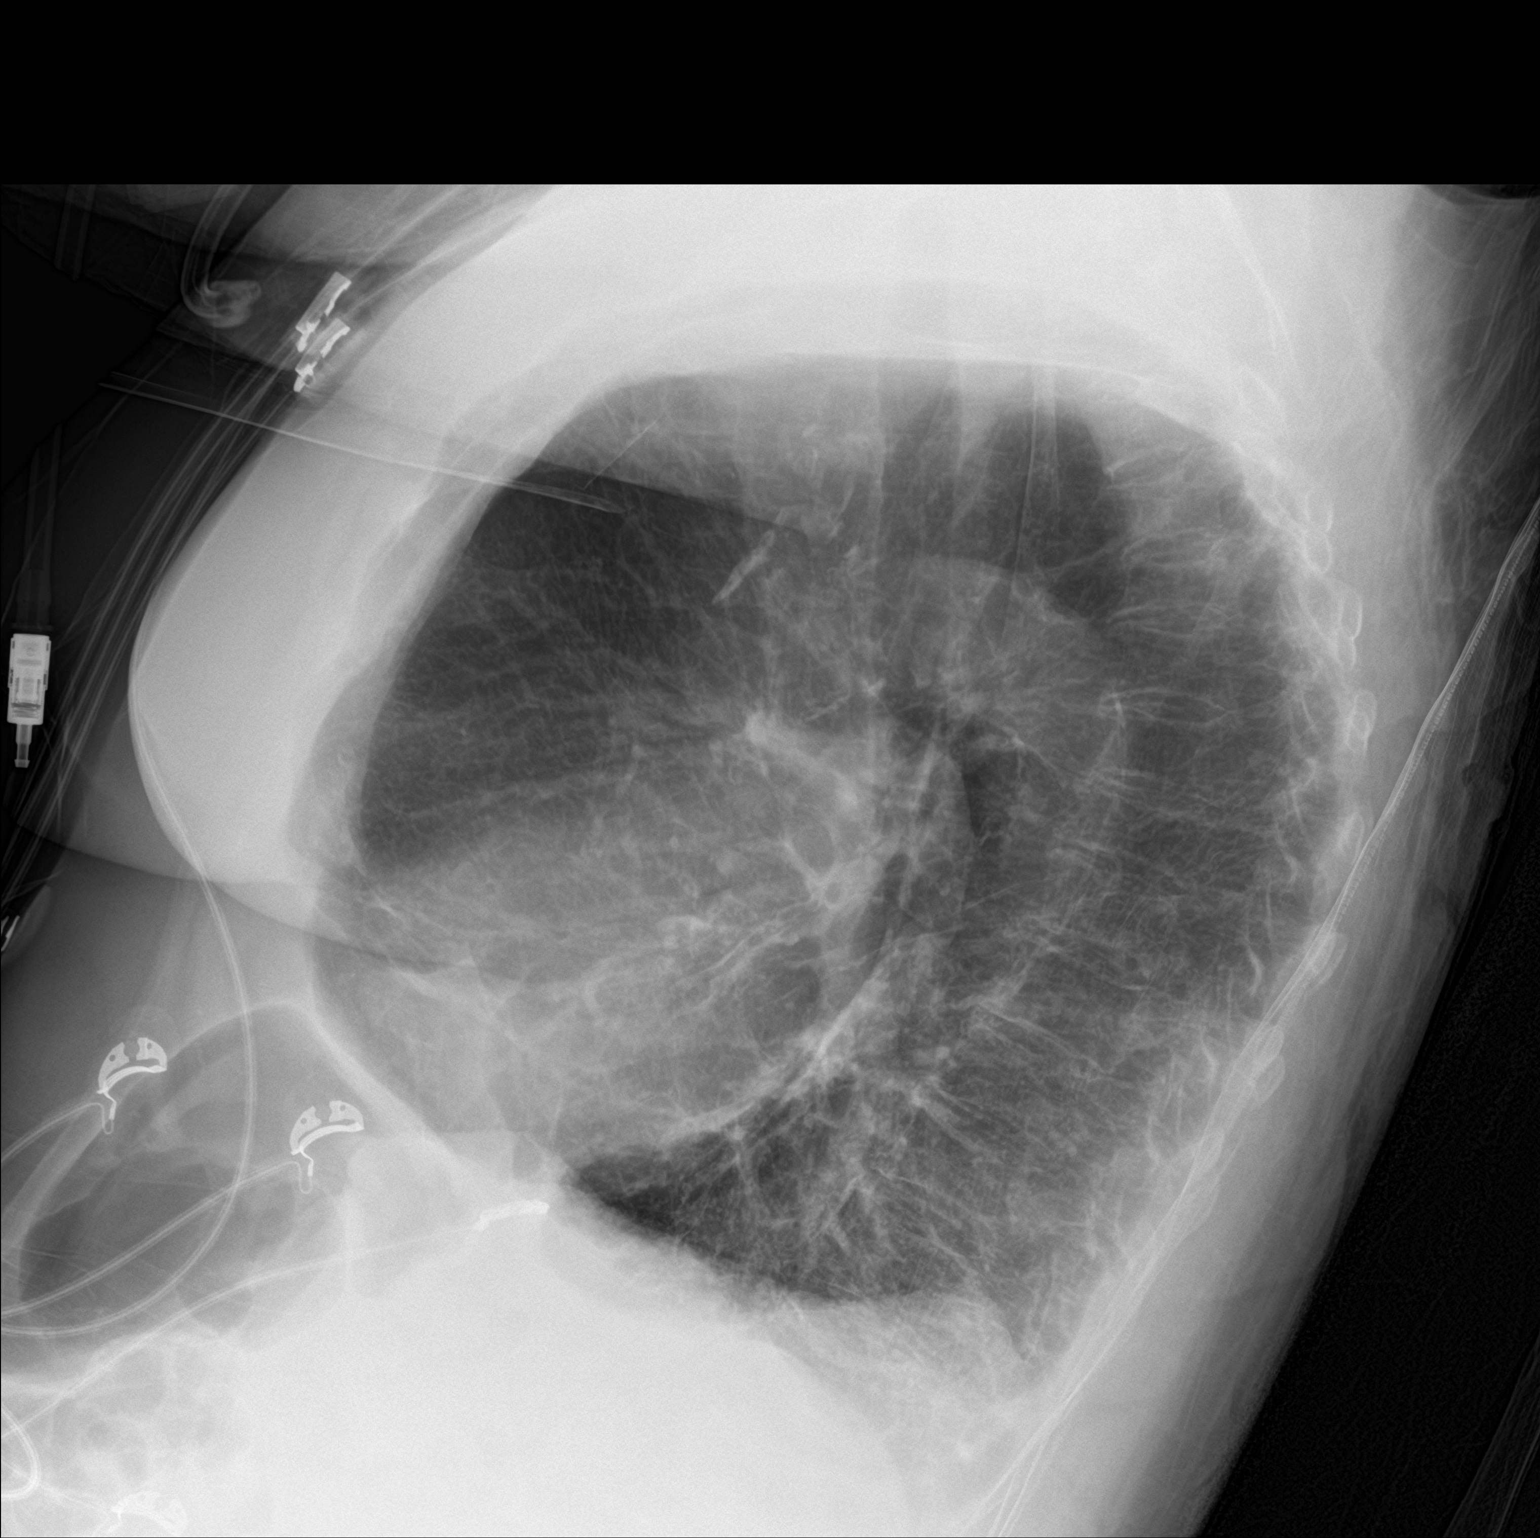

[chest ap]
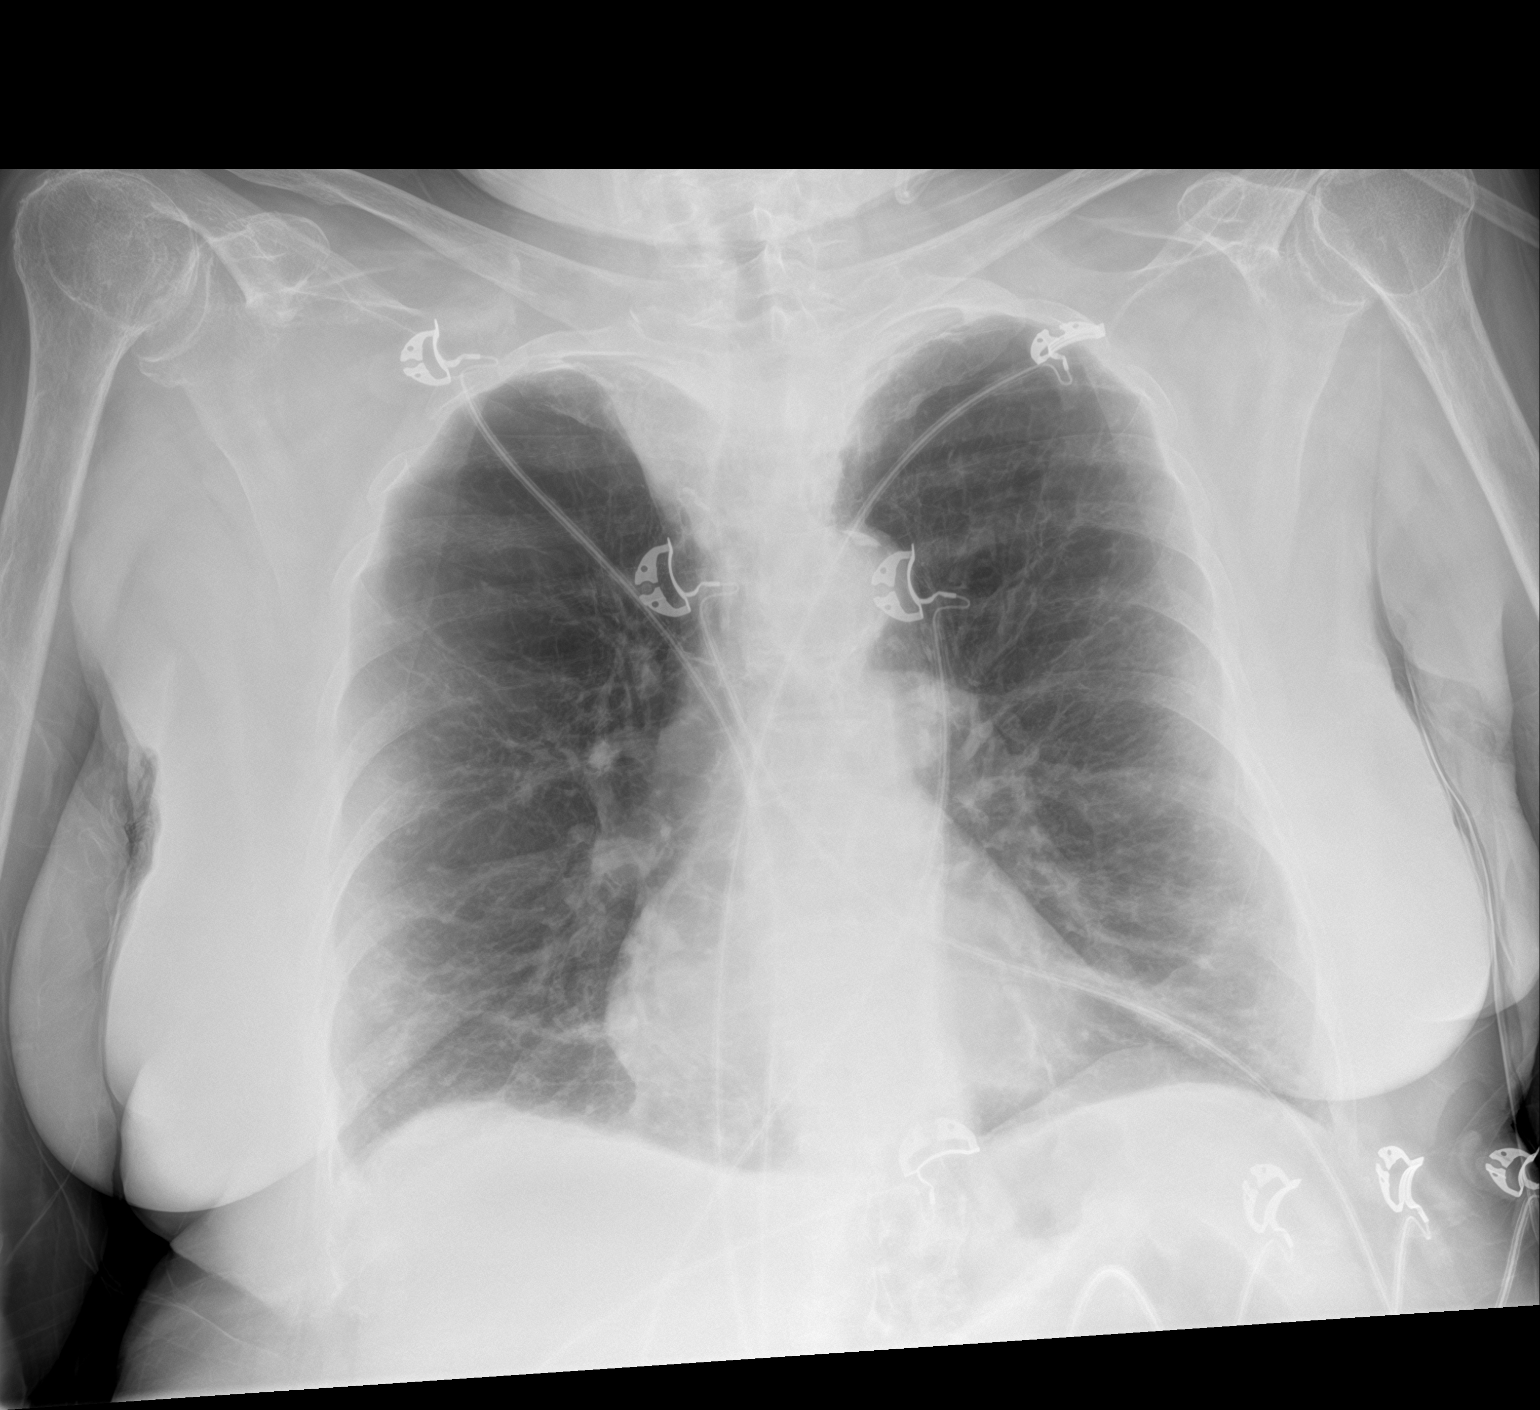

[2 of 2 positions shown; findings below may reference images not displayed]

FINDINGS: There is emphysematous changes of the lungs. There are bibasilar
linear atelectasis/ scarring. No focal consolidation, pleural
effusion, or pneumothorax. The cardiac silhouette is within normal
limits. There is coronary vascular calcification as well as
atherosclerotic calcification of the aorta. No acute osseous
pathology.
IMPRESSION: No active cardiopulmonary disease.

## 2019-07-13 IMAGING — CR DG CHEST 1V PORT
1 series · 1 of 1 positions shown · non-contrast
Comparison: 07/26/2017

CLINICAL DATA: Respiratory failure

EXAM:
PORTABLE CHEST 1 VIEW

[portable]
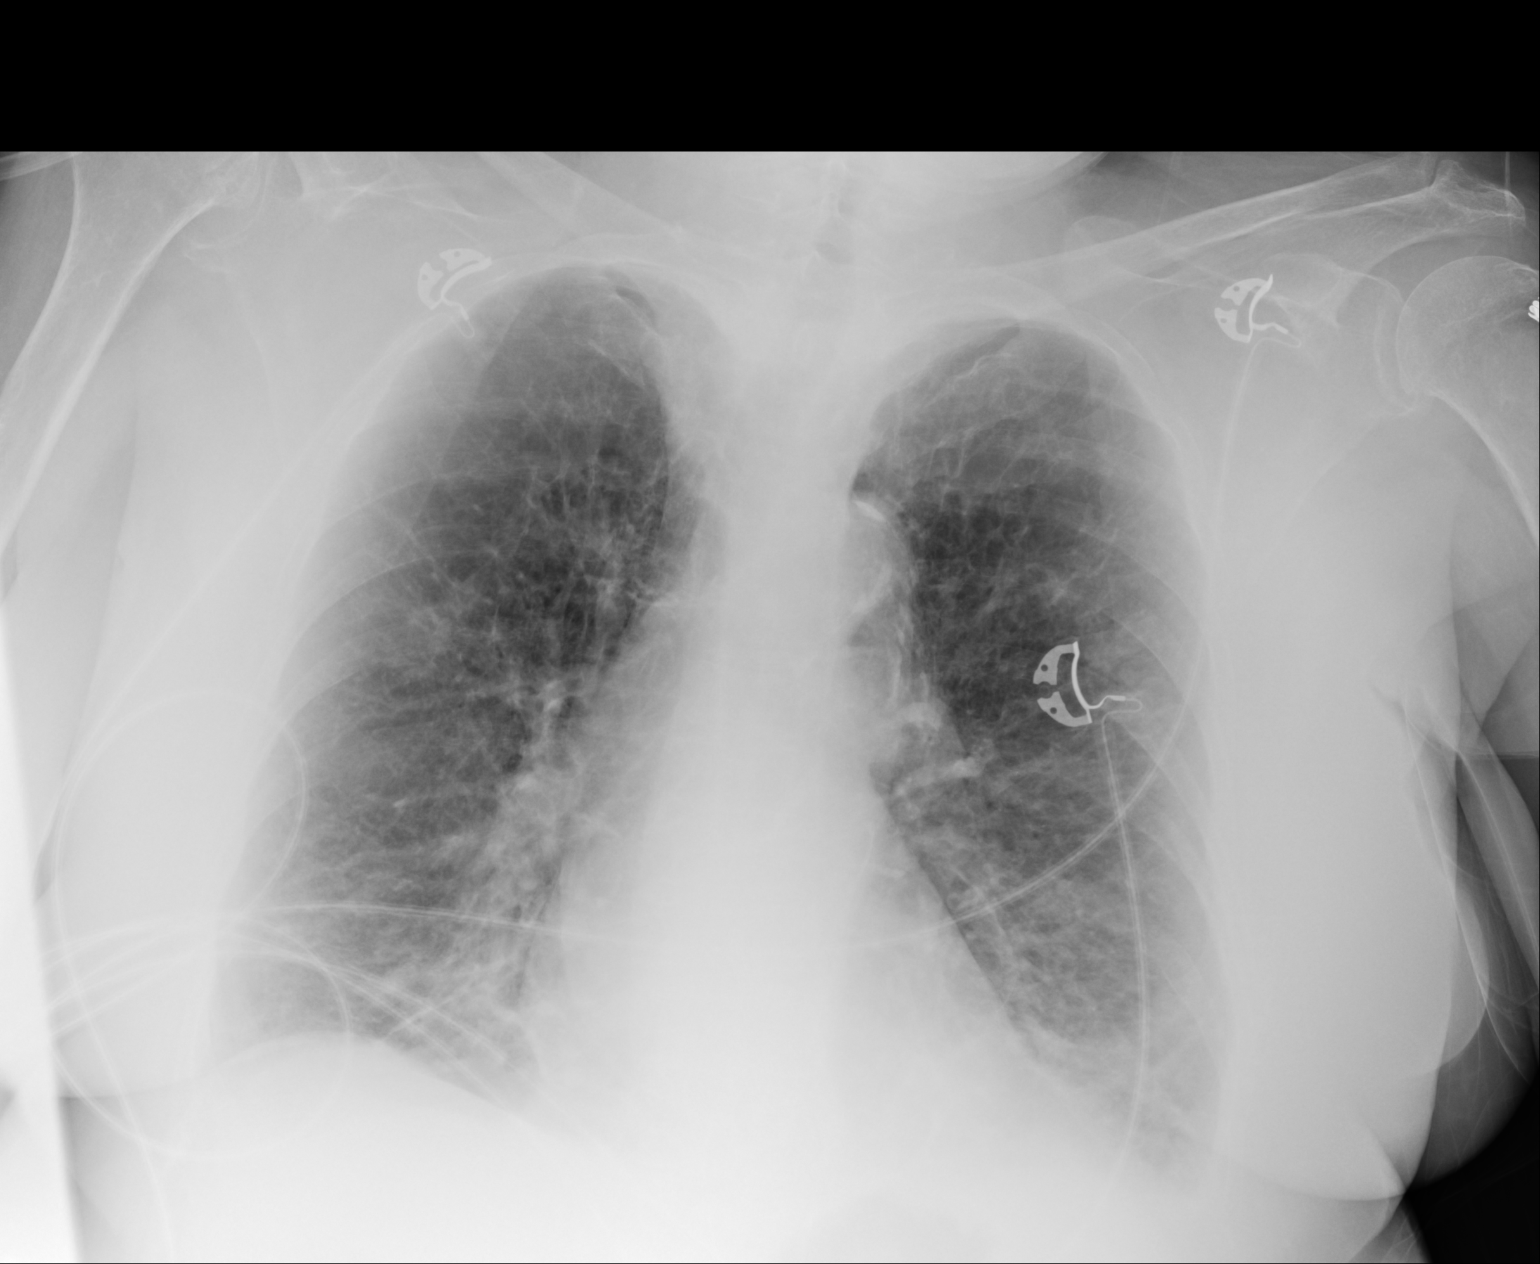

[1 of 1 positions shown; findings below may reference images not displayed]

FINDINGS: Cardiac shadow is stable. Diffuse interstitial changes are again
identified throughout both lungs. Mild bibasilar atelectasis is
noted slightly greater on the left than the right. No pneumothorax
or effusion is seen. No bony abnormality is noted.
IMPRESSION: Bibasilar atelectatic changes as described superimposed over more
chronic interstitial change.
